# Patient Record
Sex: Male | Born: 1965 | Race: Black or African American | Hispanic: No | Marital: Single | Smoking: Current every day smoker
Health system: Southern US, Community
[De-identification: ages and names within clinical notes are randomized; demographics above are authoritative.]

## PROBLEM LIST (undated history)

## (undated) DIAGNOSIS — R569 Unspecified convulsions: Secondary | ICD-10-CM

## (undated) DIAGNOSIS — S065X9A Traumatic subdural hemorrhage with loss of consciousness of unspecified duration, initial encounter: Secondary | ICD-10-CM

## (undated) DIAGNOSIS — K859 Acute pancreatitis without necrosis or infection, unspecified: Secondary | ICD-10-CM

## (undated) DIAGNOSIS — F10231 Alcohol dependence with withdrawal delirium: Secondary | ICD-10-CM

## (undated) DIAGNOSIS — J9819 Other pulmonary collapse: Secondary | ICD-10-CM

## (undated) DIAGNOSIS — A419 Sepsis, unspecified organism: Secondary | ICD-10-CM

## (undated) DIAGNOSIS — S06369A Traumatic hemorrhage of cerebrum, unspecified, with loss of consciousness of unspecified duration, initial encounter: Secondary | ICD-10-CM

## (undated) DIAGNOSIS — F1910 Other psychoactive substance abuse, uncomplicated: Secondary | ICD-10-CM

## (undated) DIAGNOSIS — E119 Type 2 diabetes mellitus without complications: Secondary | ICD-10-CM

## (undated) DIAGNOSIS — F1410 Cocaine abuse, uncomplicated: Secondary | ICD-10-CM

## (undated) DIAGNOSIS — K7011 Alcoholic hepatitis with ascites: Secondary | ICD-10-CM

## (undated) DIAGNOSIS — I10 Essential (primary) hypertension: Secondary | ICD-10-CM

## (undated) DIAGNOSIS — F10931 Alcohol use, unspecified with withdrawal delirium: Secondary | ICD-10-CM

## (undated) DIAGNOSIS — S0291XB Unspecified fracture of skull, initial encounter for open fracture: Secondary | ICD-10-CM

## (undated) DIAGNOSIS — K861 Other chronic pancreatitis: Secondary | ICD-10-CM

## (undated) DIAGNOSIS — K767 Hepatorenal syndrome: Secondary | ICD-10-CM

## (undated) DIAGNOSIS — F1027 Alcohol dependence with alcohol-induced persisting dementia: Secondary | ICD-10-CM

## (undated) DIAGNOSIS — F1010 Alcohol abuse, uncomplicated: Secondary | ICD-10-CM

## (undated) DIAGNOSIS — N179 Acute kidney failure, unspecified: Secondary | ICD-10-CM

## (undated) DIAGNOSIS — K7031 Alcoholic cirrhosis of liver with ascites: Secondary | ICD-10-CM

## (undated) DIAGNOSIS — E785 Hyperlipidemia, unspecified: Secondary | ICD-10-CM

## (undated) DIAGNOSIS — Z765 Malingerer [conscious simulation]: Secondary | ICD-10-CM

## (undated) DIAGNOSIS — N200 Calculus of kidney: Secondary | ICD-10-CM

## (undated) DIAGNOSIS — G40909 Epilepsy, unspecified, not intractable, without status epilepticus: Secondary | ICD-10-CM

## (undated) DIAGNOSIS — K7290 Hepatic failure, unspecified without coma: Secondary | ICD-10-CM

## (undated) DIAGNOSIS — Z9119 Patient's noncompliance with other medical treatment and regimen: Secondary | ICD-10-CM

## (undated) DIAGNOSIS — Z720 Tobacco use: Secondary | ICD-10-CM

## (undated) HISTORY — PX: OTHER SURGICAL HISTORY: SHX169

---

## 2003-07-16 ENCOUNTER — Emergency Department (HOSPITAL_COMMUNITY): Admission: EM | Admit: 2003-07-16 | Discharge: 2003-07-16 | Payer: Self-pay | Admitting: *Deleted

## 2003-07-27 ENCOUNTER — Emergency Department (HOSPITAL_COMMUNITY): Admission: EM | Admit: 2003-07-27 | Discharge: 2003-07-27 | Payer: Self-pay | Admitting: *Deleted

## 2004-04-08 ENCOUNTER — Emergency Department (HOSPITAL_COMMUNITY): Admission: EM | Admit: 2004-04-08 | Discharge: 2004-04-08 | Payer: Self-pay

## 2004-08-31 ENCOUNTER — Emergency Department (HOSPITAL_COMMUNITY): Admission: EM | Admit: 2004-08-31 | Discharge: 2004-08-31 | Payer: Self-pay | Admitting: Emergency Medicine

## 2004-12-30 HISTORY — PX: MANDIBLE FRACTURE SURGERY: SHX706

## 2005-05-29 ENCOUNTER — Emergency Department (HOSPITAL_COMMUNITY): Admission: EM | Admit: 2005-05-29 | Discharge: 2005-05-29 | Payer: Self-pay | Admitting: Emergency Medicine

## 2005-08-02 ENCOUNTER — Encounter: Payer: Self-pay | Admitting: Emergency Medicine

## 2005-08-02 ENCOUNTER — Inpatient Hospital Stay (HOSPITAL_COMMUNITY): Admission: EM | Admit: 2005-08-02 | Discharge: 2005-08-04 | Payer: Self-pay | Admitting: Emergency Medicine

## 2007-06-25 ENCOUNTER — Emergency Department (HOSPITAL_COMMUNITY): Admission: EM | Admit: 2007-06-25 | Discharge: 2007-06-25 | Payer: Self-pay | Admitting: Emergency Medicine

## 2007-12-30 ENCOUNTER — Emergency Department (HOSPITAL_COMMUNITY): Admission: EM | Admit: 2007-12-30 | Discharge: 2007-12-30 | Payer: Self-pay | Admitting: Emergency Medicine

## 2007-12-31 ENCOUNTER — Emergency Department (HOSPITAL_COMMUNITY): Admission: EM | Admit: 2007-12-31 | Discharge: 2007-12-31 | Payer: Self-pay | Admitting: Emergency Medicine

## 2008-03-08 ENCOUNTER — Emergency Department (HOSPITAL_COMMUNITY): Admission: EM | Admit: 2008-03-08 | Discharge: 2008-03-08 | Payer: Self-pay | Admitting: Emergency Medicine

## 2009-05-06 ENCOUNTER — Inpatient Hospital Stay (HOSPITAL_COMMUNITY): Admission: EM | Admit: 2009-05-06 | Discharge: 2009-05-09 | Payer: Self-pay | Admitting: Emergency Medicine

## 2009-05-23 ENCOUNTER — Emergency Department (HOSPITAL_COMMUNITY): Admission: EM | Admit: 2009-05-23 | Discharge: 2009-05-23 | Payer: Self-pay | Admitting: Emergency Medicine

## 2010-10-01 ENCOUNTER — Emergency Department (HOSPITAL_COMMUNITY)
Admission: EM | Admit: 2010-10-01 | Discharge: 2010-10-02 | Payer: Self-pay | Source: Home / Self Care | Admitting: Emergency Medicine

## 2010-12-24 ENCOUNTER — Emergency Department (HOSPITAL_COMMUNITY)
Admission: EM | Admit: 2010-12-24 | Discharge: 2010-12-24 | Payer: Self-pay | Source: Home / Self Care | Admitting: Emergency Medicine

## 2011-03-11 LAB — DIFFERENTIAL
Lymphs Abs: 0.7 10*3/uL (ref 0.7–4.0)
Monocytes Relative: 7 % (ref 3–12)
Neutro Abs: 8.2 10*3/uL — ABNORMAL HIGH (ref 1.7–7.7)
Neutrophils Relative %: 85 % — ABNORMAL HIGH (ref 43–77)

## 2011-03-11 LAB — HEPATIC FUNCTION PANEL
ALT: 48 U/L (ref 0–53)
Albumin: 3.4 g/dL — ABNORMAL LOW (ref 3.5–5.2)
Alkaline Phosphatase: 134 U/L — ABNORMAL HIGH (ref 39–117)
Bilirubin, Direct: 0.2 mg/dL (ref 0.0–0.3)

## 2011-03-11 LAB — BASIC METABOLIC PANEL
BUN: 15 mg/dL (ref 6–23)
CO2: 23 mEq/L (ref 19–32)
Calcium: 8.3 mg/dL — ABNORMAL LOW (ref 8.4–10.5)
GFR calc Af Amer: >60 mL/min (ref >60)
GFR calc non Af Amer: >60 mL/min (ref >60)

## 2011-03-11 LAB — URINALYSIS, ROUTINE W REFLEX MICROSCOPIC
Bilirubin Urine: NEGATIVE
Hgb urine dipstick: NEGATIVE
Ketones, ur: NEGATIVE mg/dL
Nitrite: NEGATIVE
Protein, ur: NEGATIVE mg/dL
Specific Gravity, Urine: >1.03 — ABNORMAL HIGH (ref 1.005–1.030)
Urobilinogen, UA: 0.2 mg/dL (ref 0.0–1.0)

## 2011-03-11 LAB — CBC
Hemoglobin: 15.1 g/dL (ref 13.0–17.0)
MCH: 33.6 pg (ref 26.0–34.0)
RBC: 4.5 MIL/uL (ref 4.22–5.81)
RDW: 12.8 % (ref 11.5–15.5)

## 2011-03-13 LAB — BASIC METABOLIC PANEL
BUN: 8 mg/dL (ref 6–23)
Creatinine, Ser: 0.93 mg/dL (ref 0.4–1.5)
GFR calc non Af Amer: >60 mL/min (ref >60)
Potassium: 3.7 mEq/L (ref 3.5–5.1)
Sodium: 133 mEq/L — ABNORMAL LOW (ref 135–145)

## 2011-04-09 LAB — TYPE AND SCREEN
ABO/RH(D): B POS
Antibody Screen: NEGATIVE

## 2011-04-09 LAB — CBC
MCHC: 34.6 g/dL (ref 30.0–36.0)
MCHC: 35 g/dL (ref 30.0–36.0)
MCV: 99.4 fL (ref 78.0–100.0)
Platelets: 168 10*3/uL (ref 150–400)
Platelets: 171 10*3/uL (ref 150–400)
RBC: 3.97 MIL/uL — ABNORMAL LOW (ref 4.22–5.81)
RBC: 4.27 MIL/uL (ref 4.22–5.81)
RDW: 14.6 % (ref 11.5–15.5)
WBC: 4.7 10*3/uL (ref 4.0–10.5)
WBC: 5.8 10*3/uL (ref 4.0–10.5)
WBC: 5.9 10*3/uL (ref 4.0–10.5)

## 2011-04-09 LAB — COMPREHENSIVE METABOLIC PANEL
ALT: 30 U/L (ref 0–53)
AST: 50 U/L — ABNORMAL HIGH (ref 0–37)
Alkaline Phosphatase: 58 U/L (ref 39–117)
BUN: 11 mg/dL (ref 6–23)
GFR calc Af Amer: >60 mL/min (ref >60)
Glucose, Bld: 117 mg/dL — ABNORMAL HIGH (ref 70–99)
Sodium: 142 mEq/L (ref 135–145)
Total Bilirubin: 0.8 mg/dL (ref 0.3–1.2)
Total Protein: 6.7 g/dL (ref 6.0–8.3)

## 2011-04-09 LAB — RAPID URINE DRUG SCREEN, HOSP PERFORMED
Benzodiazepines: NOT DETECTED
Cocaine: POSITIVE — AB
Opiates: NOT DETECTED
Tetrahydrocannabinol: NOT DETECTED

## 2011-04-09 LAB — ETHANOL: Alcohol, Ethyl (B): 270 mg/dL — ABNORMAL HIGH (ref 0–10)

## 2011-04-09 LAB — BASIC METABOLIC PANEL
BUN: 7 mg/dL (ref 6–23)
CO2: 27 mEq/L (ref 19–32)
Calcium: 8.8 mg/dL (ref 8.4–10.5)
Calcium: 9.5 mg/dL (ref 8.4–10.5)
Chloride: 105 mEq/L (ref 96–112)
Creatinine, Ser: 0.92 mg/dL (ref 0.4–1.5)
Creatinine, Ser: 0.97 mg/dL (ref 0.4–1.5)
GFR calc Af Amer: >60 mL/min (ref >60)

## 2011-04-09 LAB — DIFFERENTIAL
Basophils Absolute: 0 10*3/uL (ref 0.0–0.1)
Basophils Relative: 0 % (ref 0–1)
Eosinophils Absolute: 0 10*3/uL (ref 0.0–0.7)
Neutro Abs: 3.2 10*3/uL (ref 1.7–7.7)
Neutrophils Relative %: 53 % (ref 43–77)

## 2011-04-09 LAB — POCT I-STAT, CHEM 8
BUN: 12 mg/dL (ref 6–23)
Calcium, Ion: 1.01 mmol/L — ABNORMAL LOW (ref 1.12–1.32)
Creatinine, Ser: 1.1 mg/dL (ref 0.4–1.5)
Glucose, Bld: 109 mg/dL — ABNORMAL HIGH (ref 70–99)
Hemoglobin: 14.6 g/dL (ref 13.0–17.0)
Sodium: 142 mEq/L (ref 135–145)
TCO2: 22 mmol/L (ref 0–100)

## 2011-04-09 LAB — URINALYSIS, ROUTINE W REFLEX MICROSCOPIC
Bilirubin Urine: NEGATIVE
Glucose, UA: NEGATIVE mg/dL
Protein, ur: 100 mg/dL — AB

## 2011-04-09 LAB — URINE MICROSCOPIC-ADD ON
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
## 2011-05-17 NOTE — Op Note (Signed)
Paul [name], Paul [name]             ACCOUNT NO.:  [id number]   MEDICAL RECORD NO.:  [id number]          PATIENT TYPE:  INP   LOCATION:  5729                         FACILITY:  [facility]   PHYSICIAN:  Lucky Cowboy, MD         DATE OF BIRTH:  August 30, 1966   DATE OF PROCEDURE:  08/03/2005  DATE OF DISCHARGE:  08/04/2005                                 OPERATIVE REPORT   PREOPERATIVE DIAGNOSIS:  Left parasymphyseal and right posterior body  mandible fractures.   POSTOPERATIVE DIAGNOSIS:  Left parasymphyseal and right posterior body  mandible fractures.   PROCEDURE:  Open reduction and internal fixation of left mandibular  parasymphyseal and right posterior body fractures.   SURGEON:  Lucky Cowboy, M.D.   ASSISTANT SURGEON:  Antony Contras, M.D.   ANESTHESIA:  General endotracheal anesthesia.   ESTIMATED BLOOD LOSS:  Less than 50 cc.   SPECIMENS:  None.   COMPLICATIONS:  None.   INDICATION:  This patient is a 45 year old male who sustained a mandibular  fractures as described above. He requires fixation.   DESCRIPTION OF PROCEDURE:  The patient was taken to the operating room and  placed on the table in the supine position. He was then placed under general  endotracheal anesthesia. The upper and lower midline gingiva was injected  with 1% lidocaine with 1:100,000 epinephrine. After allowing time for  vasoconstrictive effect, the gingiva was opened up using the cutting mode of  the Bovie. This was performed above the tooth roots superiorly just inferior  to the piriform apertures. This was then used as an entrance to apply the  Therapist, nutritional to elevate soft tissues over the bone. The mandibular screw  fixation system was then used to apply bolts in both of these areas.  Inferiorly, a similar procedure was performed to apply bolts inferiorly  again using care to avoid the tooth roots. The patient was then placed in  maxillomandibular fixation and wires applied. Once this was performed,  the  inferior gingiva was widely opened using the cutting mode of the Bovie  cautery. Freer elevator was used to open up and expose the bony fractures.  Care was used to protect the mental nerve. The bony edges were  reapproximated carefully, and a 2-mm four-hole mandibular fixation plate was  applied to the inferior margin of the left parasymphyseal fracture. Four  screws were then applied which were bicortical.  A monocortical plate was  applied superiorly. The same procedure was applied to the body fracture on  the right. This resulted in Class I occlusion. A monocortical plate was then  applied as well. The patient was taken out of fixation as all fracture lines  had been applied. The patient did have to have a small stab wound in the  face of approximately 0.5 cm to put a transbuccal screw in due to the angle  of the posterior portion of the screw on the posterior portion of the plate  for the right angle  fracture. The patient's incisions were reapproximated in a running locking  stitch using 3-0 chromic. The wounds and all been  irrigated prior to  closure. The patient was then awakened from anesthesia and taken to the  postanesthesia care unit in stable condition. There were no complications.      Lucky Cowboy, MD  Electronically Signed     SJ/MEDQ  D:  10/12/2005  T:  10/12/2005  Job:  [id number]   cc:   [facility]

## 2011-06-29 ENCOUNTER — Emergency Department (HOSPITAL_COMMUNITY): Payer: Self-pay

## 2011-06-29 ENCOUNTER — Emergency Department (HOSPITAL_COMMUNITY)
Admission: EM | Admit: 2011-06-29 | Discharge: 2011-06-29 | Disposition: A | Discharge status: Home / Self Care | Payer: Self-pay | Attending: Emergency Medicine | Admitting: Emergency Medicine

## 2011-06-29 DIAGNOSIS — T6391XA Toxic effect of contact with unspecified venomous animal, accidental (unintentional), initial encounter: Secondary | ICD-10-CM | POA: Insufficient documentation

## 2011-06-29 DIAGNOSIS — I10 Essential (primary) hypertension: Secondary | ICD-10-CM | POA: Insufficient documentation

## 2011-06-29 DIAGNOSIS — F1410 Cocaine abuse, uncomplicated: Secondary | ICD-10-CM | POA: Insufficient documentation

## 2011-06-29 DIAGNOSIS — F1010 Alcohol abuse, uncomplicated: Secondary | ICD-10-CM | POA: Insufficient documentation

## 2011-06-29 DIAGNOSIS — T63391A Toxic effect of venom of other spider, accidental (unintentional), initial encounter: Secondary | ICD-10-CM | POA: Insufficient documentation

## 2011-06-29 LAB — CBC
Hemoglobin: 13.5 g/dL (ref 13.0–17.0)
MCH: 34.4 pg — ABNORMAL HIGH (ref 26.0–34.0)
MCV: 100.3 fL — ABNORMAL HIGH (ref 78.0–100.0)
RBC: 3.92 MIL/uL — ABNORMAL LOW (ref 4.22–5.81)

## 2011-06-29 LAB — DIFFERENTIAL
Basophils Relative: 0 % (ref 0–1)
Lymphocytes Relative: 15 % (ref 12–46)
Lymphs Abs: 1.2 10*3/uL (ref 0.7–4.0)
Monocytes Relative: 8 % (ref 3–12)
Neutro Abs: 6.6 10*3/uL (ref 1.7–7.7)
Neutrophils Relative %: 77 % (ref 43–77)

## 2011-06-29 LAB — BASIC METABOLIC PANEL
CO2: 27 mEq/L (ref 19–32)
Calcium: 10 mg/dL (ref 8.4–10.5)
Glucose, Bld: 86 mg/dL (ref 70–99)
Sodium: 138 mEq/L (ref 135–145)

## 2011-07-24 ENCOUNTER — Inpatient Hospital Stay (HOSPITAL_COMMUNITY)
Admission: EM | Admit: 2011-07-24 | Discharge: 2011-07-29 | DRG: 439 | Disposition: A | Discharge status: Home / Self Care | Payer: Self-pay | Attending: Internal Medicine | Admitting: Internal Medicine

## 2011-07-24 ENCOUNTER — Encounter: Payer: Self-pay | Admitting: *Deleted

## 2011-07-24 DIAGNOSIS — F1410 Cocaine abuse, uncomplicated: Secondary | ICD-10-CM | POA: Diagnosis present

## 2011-07-24 DIAGNOSIS — K859 Acute pancreatitis without necrosis or infection, unspecified: Principal | ICD-10-CM | POA: Diagnosis present

## 2011-07-24 DIAGNOSIS — F17200 Nicotine dependence, unspecified, uncomplicated: Secondary | ICD-10-CM | POA: Diagnosis present

## 2011-07-24 DIAGNOSIS — F1010 Alcohol abuse, uncomplicated: Secondary | ICD-10-CM | POA: Diagnosis present

## 2011-07-24 DIAGNOSIS — K852 Alcohol induced acute pancreatitis without necrosis or infection: Secondary | ICD-10-CM

## 2011-07-24 DIAGNOSIS — Z720 Tobacco use: Secondary | ICD-10-CM | POA: Diagnosis present

## 2011-07-24 DIAGNOSIS — Z5900 Homelessness unspecified: Secondary | ICD-10-CM

## 2011-07-24 DIAGNOSIS — E876 Hypokalemia: Secondary | ICD-10-CM | POA: Diagnosis not present

## 2011-07-24 DIAGNOSIS — I10 Essential (primary) hypertension: Secondary | ICD-10-CM | POA: Diagnosis present

## 2011-07-24 DIAGNOSIS — K920 Hematemesis: Secondary | ICD-10-CM | POA: Diagnosis present

## 2011-07-24 HISTORY — DX: Hyperlipidemia, unspecified: E78.5

## 2011-07-24 HISTORY — DX: Acute pancreatitis without necrosis or infection, unspecified: K85.90

## 2011-07-24 HISTORY — DX: Essential (primary) hypertension: I10

## 2011-07-24 HISTORY — DX: Other pulmonary collapse: J98.19

## 2011-07-24 LAB — URINALYSIS, ROUTINE W REFLEX MICROSCOPIC
Leukocytes, UA: NEGATIVE
Nitrite: NEGATIVE
Protein, ur: 100 mg/dL — AB
Urobilinogen, UA: 0.2 mg/dL (ref 0.0–1.0)

## 2011-07-24 LAB — URINE MICROSCOPIC-ADD ON

## 2011-07-24 LAB — CBC
MCH: 34.3 pg — ABNORMAL HIGH (ref 26.0–34.0)
MCV: 98.9 fL (ref 78.0–100.0)
Platelets: 192 10*3/uL (ref 150–400)
RBC: 4.67 MIL/uL (ref 4.22–5.81)

## 2011-07-24 MED ORDER — CLONIDINE HCL 0.1 MG PO TABS
0.1000 mg | ORAL_TABLET | Freq: Once | ORAL | Status: AC
  Administered 2011-07-24: 0.1 mg via ORAL
  Filled 2011-07-24: qty 1

## 2011-07-24 MED ORDER — ONDANSETRON HCL 4 MG/2ML IJ SOLN
4.0000 mg | Freq: Once | INTRAMUSCULAR | Status: AC
  Administered 2011-07-24: 4 mg via INTRAVENOUS
  Filled 2011-07-24: qty 2

## 2011-07-24 MED ORDER — SODIUM CHLORIDE 0.9 % IV BOLUS (SEPSIS)
1000.0000 mL | Freq: Once | INTRAVENOUS | Status: AC
  Administered 2011-07-24: 1000 mL via INTRAVENOUS

## 2011-07-24 MED ORDER — HYDROMORPHONE HCL 1 MG/ML IJ SOLN
1.0000 mg | Freq: Once | INTRAMUSCULAR | Status: AC
  Administered 2011-07-24: 1 mg via INTRAVENOUS
  Filled 2011-07-24: qty 1

## 2011-07-24 NOTE — ED Notes (Signed)
Denies urinary problems

## 2011-07-24 NOTE — ED Notes (Signed)
Patient with abd pain with N/V for 3 days, denies diarrhea, hx of pancreatitis

## 2011-07-24 NOTE — ED Notes (Addendum)
Notified MD of elevated BP

## 2011-07-24 NOTE — ED Provider Notes (Signed)
History     Chief Complaint  Patient presents with  . Abdominal Pain  . Nausea  . Emesis   Patient is a 45 y.o. male presenting with abdominal pain. The history is provided by the patient. No language interpreter was used.  Abdominal Pain The primary symptoms of the illness include abdominal pain, nausea and vomiting. The primary symptoms of the illness do not include fever, shortness of breath or diarrhea. Episode onset: 4 days ago. The onset of the illness was sudden. The problem has been gradually worsening.  The abdominal pain is located in the periumbilical region and suprapubic region. The abdominal pain does not radiate. The abdominal pain is relieved by nothing.  The patient has not had a change in bowel habit. Symptoms associated with the illness do not include chills. Associated medical issues comments: pancreatitis.  C/o abdominal pain similar to pancreatitis previously experienced onset 4 days ago and worsening since with associated nausea, vomiting and hemopytsis. Patient reports he was diagnosed with pancreatitis a few months ago. States abdominal pain not relieved with pepto-bismol. States last bowel movement was this morning and normal. Denies fever, chest pain, SOB, chills.  Past Medical History  Diagnosis Date  . Pancreatitis, acute   . Hypertension     History reviewed. No pertinent past surgical history.  History reviewed. No pertinent family history.  History  Substance Use Topics  . Smoking status: Current Everyday Smoker -- 1.0 packs/day    Types: Cigarettes  . Smokeless tobacco: Not on file  . Alcohol Use: Yes     3 40oz per day      Review of Systems  Constitutional: Negative for fever and chills.  HENT:       Hemopytsis  Respiratory: Negative for shortness of breath.   Cardiovascular: Negative for chest pain.  Gastrointestinal: Positive for nausea, vomiting and abdominal pain. Negative for diarrhea.  All other systems reviewed and are  negative.  All other systems negative except as noted in HPI.    Physical Exam  BP 195/151  Pulse 116  Temp(Src) 98.4 F (36.9 C) (Oral)  Resp 18  Ht 6' (1.829 m)  Wt 170 lb (77.111 kg)  BMI 23.06 kg/m2  SpO2 99%  Physical Exam  Nursing note and vitals reviewed. Constitutional: He is oriented to person, place, and time. He appears well-developed and well-nourished. No distress.       Hypertensive.   HENT:  Head: Normocephalic and atraumatic.  Eyes: Conjunctivae are normal. Pupils are equal, round, and reactive to light.  Neck: Normal range of motion. Neck supple.  Cardiovascular: Regular rhythm and normal heart sounds.  Tachycardia present.   No murmur heard. Pulmonary/Chest: Effort normal and breath sounds normal. He has no wheezes.  Abdominal: Soft. Bowel sounds are normal. He exhibits no distension. There is tenderness in the periumbilical area and suprapubic area.       No epigastric tenderness.   Musculoskeletal: Normal range of motion.  Neurological: He is alert and oriented to person, place, and time. No sensory deficit.  Skin: Skin is warm and dry.  Psychiatric: He has a normal mood and affect. His behavior is normal.   ED Course  Procedures 5:03 AM Awiating call back from [facility] hospitalist for admission for pancreatitis  MDM Patient with elevated lipase, abdominal pain, vomiting, acute pancreatitis on CT scan.  Admitted to [facility] for further management    Chart written by Clarita Crane acting as scribe for Ethelda Chick, MD  I personally performed  the services described in this documentation, which was scribed in my presence. The recorded information has been reviewed and considered. Ethelda Chick, MD   Ethelda Chick, MD 07/25/11 [phone]

## 2011-07-25 ENCOUNTER — Emergency Department (HOSPITAL_COMMUNITY): Payer: Self-pay

## 2011-07-25 ENCOUNTER — Encounter (HOSPITAL_COMMUNITY): Payer: Self-pay | Admitting: Internal Medicine

## 2011-07-25 ENCOUNTER — Other Ambulatory Visit: Payer: Self-pay

## 2011-07-25 ENCOUNTER — Inpatient Hospital Stay (HOSPITAL_COMMUNITY): Payer: Self-pay

## 2011-07-25 DIAGNOSIS — I10 Essential (primary) hypertension: Secondary | ICD-10-CM | POA: Diagnosis present

## 2011-07-25 DIAGNOSIS — K920 Hematemesis: Secondary | ICD-10-CM

## 2011-07-25 DIAGNOSIS — F1410 Cocaine abuse, uncomplicated: Secondary | ICD-10-CM | POA: Diagnosis present

## 2011-07-25 DIAGNOSIS — F1010 Alcohol abuse, uncomplicated: Secondary | ICD-10-CM | POA: Diagnosis present

## 2011-07-25 DIAGNOSIS — Z720 Tobacco use: Secondary | ICD-10-CM | POA: Diagnosis present

## 2011-07-25 DIAGNOSIS — K852 Alcohol induced acute pancreatitis without necrosis or infection: Secondary | ICD-10-CM | POA: Diagnosis present

## 2011-07-25 DIAGNOSIS — K859 Acute pancreatitis without necrosis or infection, unspecified: Secondary | ICD-10-CM

## 2011-07-25 LAB — COMPREHENSIVE METABOLIC PANEL WITH GFR
ALT: 13 U/L (ref 0–53)
AST: 26 U/L (ref 0–37)
Albumin: 4.1 g/dL (ref 3.5–5.2)
Alkaline Phosphatase: 71 U/L (ref 39–117)
BUN: 8 mg/dL (ref 6–23)
CO2: 27 meq/L (ref 19–32)
Calcium: 10.1 mg/dL (ref 8.4–10.5)
Chloride: 95 meq/L — ABNORMAL LOW (ref 96–112)
Creatinine, Ser: 0.71 mg/dL (ref 0.50–1.35)
GFR calc Af Amer: >60 mL/min
GFR calc non Af Amer: >60 mL/min
Glucose, Bld: 146 mg/dL — ABNORMAL HIGH (ref 70–99)
Potassium: 3.6 meq/L (ref 3.5–5.1)
Sodium: 137 meq/L (ref 135–145)
Total Bilirubin: 0.8 mg/dL (ref 0.3–1.2)
Total Protein: 8.9 g/dL — ABNORMAL HIGH (ref 6.0–8.3)

## 2011-07-25 LAB — COMPREHENSIVE METABOLIC PANEL
Albumin: 3.4 g/dL — ABNORMAL LOW (ref 3.5–5.2)
Alkaline Phosphatase: 59 U/L (ref 39–117)
BUN: 6 mg/dL (ref 6–23)
Calcium: 9.1 mg/dL (ref 8.4–10.5)
Creatinine, Ser: 0.63 mg/dL (ref 0.50–1.35)
Potassium: 3.3 mEq/L — ABNORMAL LOW (ref 3.5–5.1)
Total Protein: 7.4 g/dL (ref 6.0–8.3)

## 2011-07-25 LAB — CBC
HCT: 41.3 % (ref 39.0–52.0)
Hemoglobin: 14.2 g/dL (ref 13.0–17.0)
MCV: 98.3 fL (ref 78.0–100.0)
RBC: 4.2 MIL/uL — ABNORMAL LOW (ref 4.22–5.81)
WBC: 5.6 10*3/uL (ref 4.0–10.5)

## 2011-07-25 LAB — LIPASE, BLOOD: Lipase: 674 U/L — ABNORMAL HIGH (ref 11–59)

## 2011-07-25 LAB — RAPID URINE DRUG SCREEN, HOSP PERFORMED
Amphetamines: NOT DETECTED
Benzodiazepines: NOT DETECTED
Opiates: NOT DETECTED
Tetrahydrocannabinol: NOT DETECTED

## 2011-07-25 LAB — LIPID PANEL: HDL: 146 mg/dL (ref >39)

## 2011-07-25 MED ORDER — AMLODIPINE BESYLATE 5 MG PO TABS
10.0000 mg | ORAL_TABLET | Freq: Every day | ORAL | Status: DC
  Administered 2011-07-25: 10 mg via ORAL
  Filled 2011-07-25: qty 2

## 2011-07-25 MED ORDER — LISINOPRIL 10 MG PO TABS: 20.0000 mg | ORAL_TABLET | Freq: Every day | ORAL | Status: DC

## 2011-07-25 MED ORDER — LORAZEPAM 1 MG PO TABS: 0.0000 mg | ORAL_TABLET | Freq: Two times a day (BID) | ORAL | Status: AC

## 2011-07-25 MED ORDER — NICOTINE 14 MG/24HR TD PT24
14.0000 mg | MEDICATED_PATCH | Freq: Every day | TRANSDERMAL | Status: DC
  Administered 2011-07-25 – 2011-07-28 (×4): 14 mg via TRANSDERMAL
  Filled 2011-07-25 (×5): qty 1

## 2011-07-25 MED ORDER — DEXTROSE-NACL 5-0.45 % IV SOLN
INTRAVENOUS | Status: DC
  Administered 2011-07-25: 11:00:00 via INTRAVENOUS
  Administered 2011-07-25: 1000 mL via INTRAVENOUS
  Administered 2011-07-25 – 2011-07-26 (×4): via INTRAVENOUS

## 2011-07-25 MED ORDER — THERA M PLUS PO TABS
1.0000 | ORAL_TABLET | Freq: Every day | ORAL | Status: DC
  Administered 2011-07-26 – 2011-07-29 (×4): 1 via ORAL
  Filled 2011-07-25 (×4): qty 1

## 2011-07-25 MED ORDER — ACETAMINOPHEN 325 MG PO TABS
650.0000 mg | ORAL_TABLET | Freq: Four times a day (QID) | ORAL | Status: DC | PRN
  Administered 2011-07-25: 650 mg via ORAL
  Filled 2011-07-25: qty 2

## 2011-07-25 MED ORDER — OXYCODONE HCL 5 MG PO TABS: 5.0000 mg | ORAL_TABLET | ORAL | Status: DC | PRN

## 2011-07-25 MED ORDER — SODIUM CHLORIDE 0.9 % IJ SOLN
INTRAMUSCULAR | Status: AC
  Administered 2011-07-25: 10 mL via INTRAVENOUS
  Filled 2011-07-25: qty 10

## 2011-07-25 MED ORDER — PROMETHAZINE HCL 12.5 MG PO TABS: 12.5000 mg | ORAL_TABLET | Freq: Four times a day (QID) | ORAL | Status: DC | PRN

## 2011-07-25 MED ORDER — ONDANSETRON HCL 4 MG/2ML IJ SOLN: 4.0000 mg | Freq: Four times a day (QID) | INTRAMUSCULAR | Status: DC | PRN

## 2011-07-25 MED ORDER — SODIUM CHLORIDE 0.9 % IV BOLUS (SEPSIS)
1000.0000 mL | Freq: Once | INTRAVENOUS | Status: AC
  Administered 2011-07-25: 1000 mL via INTRAVENOUS

## 2011-07-25 MED ORDER — FOLIC ACID 5 MG/ML IJ SOLN
1.0000 mg | Freq: Every day | INTRAMUSCULAR | Status: DC
  Administered 2011-07-25: 1 mg via INTRAVENOUS
  Filled 2011-07-25 (×2): qty 0.2

## 2011-07-25 MED ORDER — HYDROMORPHONE HCL 1 MG/ML IJ SOLN
1.0000 mg | Freq: Once | INTRAMUSCULAR | Status: AC
  Administered 2011-07-25: 1 mg via INTRAVENOUS
  Filled 2011-07-25: qty 1

## 2011-07-25 MED ORDER — NITROGLYCERIN 2 % TD OINT
1.0000 [in_us] | TOPICAL_OINTMENT | Freq: Four times a day (QID) | TRANSDERMAL | Status: DC
  Administered 2011-07-25 – 2011-07-26 (×5): 1 [in_us] via TOPICAL
  Filled 2011-07-25 (×5): qty 1

## 2011-07-25 MED ORDER — IOHEXOL 300 MG/ML  SOLN
100.0000 mL | Freq: Once | INTRAMUSCULAR | Status: AC | PRN
  Administered 2011-07-25: 100 mL via INTRAVENOUS

## 2011-07-25 MED ORDER — LABETALOL HCL 5 MG/ML IV SOLN
20.0000 mg | Freq: Once | INTRAVENOUS | Status: AC
  Administered 2011-07-25: 20 mg via INTRAVENOUS
  Filled 2011-07-25: qty 4

## 2011-07-25 MED ORDER — LORAZEPAM 1 MG PO TABS
1.0000 mg | ORAL_TABLET | Freq: Four times a day (QID) | ORAL | Status: AC | PRN
  Filled 2011-07-25: qty 1

## 2011-07-25 MED ORDER — LORAZEPAM 2 MG/ML IJ SOLN: 1.0000 mg | Freq: Four times a day (QID) | INTRAMUSCULAR | Status: AC | PRN

## 2011-07-25 MED ORDER — LORAZEPAM 1 MG PO TABS
0.0000 mg | ORAL_TABLET | Freq: Four times a day (QID) | ORAL | Status: AC
  Administered 2011-07-26: 1 mg via ORAL

## 2011-07-25 MED ORDER — ALBUTEROL SULFATE (5 MG/ML) 0.5% IN NEBU: 2.5000 mg | INHALATION_SOLUTION | RESPIRATORY_TRACT | Status: DC | PRN

## 2011-07-25 MED ORDER — ONDANSETRON HCL 4 MG PO TABS: 4.0000 mg | ORAL_TABLET | Freq: Four times a day (QID) | ORAL | Status: DC | PRN

## 2011-07-25 MED ORDER — HYDROMORPHONE HCL 1 MG/ML IJ SOLN
1.0000 mg | INTRAMUSCULAR | Status: DC | PRN
  Administered 2011-07-25 – 2011-07-27 (×9): 1 mg via INTRAVENOUS
  Filled 2011-07-25 (×10): qty 1

## 2011-07-25 MED ORDER — PANTOPRAZOLE SODIUM 40 MG IV SOLR
40.0000 mg | Freq: Two times a day (BID) | INTRAVENOUS | Status: DC
  Administered 2011-07-25: 40 mg via INTRAVENOUS
  Filled 2011-07-25 (×2): qty 40

## 2011-07-25 MED ORDER — PROMETHAZINE HCL 25 MG/ML IJ SOLN: 12.5000 mg | Freq: Four times a day (QID) | INTRAMUSCULAR | Status: DC | PRN

## 2011-07-25 MED ORDER — ACETAMINOPHEN 650 MG RE SUPP: 650.0000 mg | Freq: Four times a day (QID) | RECTAL | Status: DC | PRN

## 2011-07-25 MED ORDER — BIOTENE DRY MOUTH MT LIQD
OROMUCOSAL | Status: DC | PRN
  Administered 2011-07-25: 15:00:00 via OROMUCOSAL

## 2011-07-25 MED ORDER — HYDRALAZINE HCL 20 MG/ML IJ SOLN
10.0000 mg | Freq: Four times a day (QID) | INTRAMUSCULAR | Status: DC | PRN
  Administered 2011-07-25 – 2011-07-26 (×3): 10 mg via INTRAVENOUS
  Filled 2011-07-25 (×3): qty 1

## 2011-07-25 MED ORDER — THIAMINE HCL 100 MG/ML IJ SOLN
100.0000 mg | Freq: Every day | INTRAMUSCULAR | Status: DC
  Administered 2011-07-25 – 2011-07-27 (×3): 100 mg via INTRAVENOUS
  Filled 2011-07-25 (×3): qty 2

## 2011-07-25 NOTE — Consult Note (Cosign Needed)
Referring Provider: [facility] Hospitalist AP1, Dr. Osvaldo Shipper Primary Care Physician:  None Primary Gastroenterologist:  Roetta Sessions, MD  Reason for Consultation:  Acute pancreatitis, hematemesis  HPI: Paul [name] is a 45 y.o. male admitted with acute pancreatitis. Abdominal pain started five days ago. C/O N/V. Single episode of bright red emesis yesterday. Since then he has had clear emesis. No melena, brbpr. No constipation/diarrhea. Lots of heartburn. No dysphagia. Weight stable. No dysuria. No chest or coughing. Last etoh four days ago. H/O DTs. Seen in ED 11/2010 with pancreatitis. Patient states this is his second episode. Yesterday tried Pepto Bismol and Maalox, didn't help. No other medications. None on regular basis.   Patient states he is homeless and has been for years. He stays with friends or in abandoned homes. Has h/o HTN, hyperlipidemia but has not been on meds in years. Admits to daily beer use, 120 ounces, daily. Also with frequent cocaine use.     Current Facility-Administered Medications  Medication Dose Route Frequency Provider Last Rate Last Dose  . acetaminophen (TYLENOL) tablet 650 mg  650 mg Oral Q6H PRN Gokul Krishnan       Or  . acetaminophen (TYLENOL) suppository 650 mg  650 mg Rectal Q6H PRN Osvaldo Shipper      . albuterol (PROVENTIL) (5 MG/ML) 0.5% nebulizer solution 2.5 mg  2.5 mg Nebulization Q2H PRN Osvaldo Shipper      . amLODipine (NORVASC) tablet 10 mg  10 mg Oral Daily Gokul Krishnan   10 mg at 07/25/11 0615  . cloNIDine (CATAPRES) tablet 0.1 mg  0.1 mg Oral Once Ethelda Chick, MD   0.1 mg at 07/24/11 2345  . dextrose 5 %-0.45 % sodium chloride infusion   Intravenous Continuous Gokul Krishnan      . folic acid injection 1 mg  1 mg Intravenous Daily Gokul Krishnan      . hydrALAZINE (APRESOLINE) injection 10 mg  10 mg Intravenous Q6H PRN Gokul Krishnan   10 mg at 07/25/11 4098  . HYDROmorphone (DILAUDID) injection 1 mg  1 mg Intravenous Once Ethelda Chick, MD   1 mg at 07/24/11 2245  . HYDROmorphone (DILAUDID) injection 1 mg  1 mg Intravenous Once Ethelda Chick, MD   1 mg at 07/25/11 0100  . HYDROmorphone (DILAUDID) injection 1 mg  1 mg Intravenous Q3H PRN Gokul Krishnan   1 mg at 07/25/11 0921  . iohexol (OMNIPAQUE) 300 MG/ML injection 100 mL  100 mL Intravenous Once PRN Medication Radiologist   100 mL at 07/25/11 0233  . labetalol (NORMODYNE,TRANDATE) injection 20 mg  20 mg Intravenous Once Gokul Krishnan   20 mg at 07/25/11 0615  . lisinopril (PRINIVIL,ZESTRIL) tablet 20 mg  20 mg Oral Daily Gokul Krishnan      . LORazepam (ATIVAN) tablet 1 mg  1 mg Oral Q6H PRN Gokul Krishnan       Or  . LORazepam (ATIVAN) injection 1 mg  1 mg Intravenous Q6H PRN Osvaldo Shipper      . LORazepam (ATIVAN) tablet 0-4 mg  0-4 mg Oral Q6H Gokul Krishnan       Followed by  . LORazepam (ATIVAN) tablet 0-4 mg  0-4 mg Oral Q12H Gokul Krishnan      . multivitamins ther. w/minerals tablet 1 tablet  1 tablet Oral Daily Gokul Krishnan      . nicotine (NICODERM CQ - dosed in mg/24 hours) patch 14 mg  14 mg Transdermal Daily Gokul Krishnan      .  nitroGLYCERIN (NITROGLYN) 2 % ointment 1 inch  1 inch Topical Q6H Gokul Krishnan   1 inch at 07/25/11 0615  . ondansetron (ZOFRAN) injection 4 mg  4 mg Intravenous Once Ethelda Chick, MD   4 mg at 07/24/11 2245  . ondansetron (ZOFRAN) tablet 4 mg  4 mg Oral Q6H PRN Gokul Krishnan       Or  . ondansetron (ZOFRAN) injection 4 mg  4 mg Intravenous Q6H PRN Gokul Krishnan      . oxyCODONE (Oxy IR/ROXICODONE) immediate release tablet 5 mg  5 mg Oral Q4H PRN Gokul Krishnan      . pantoprazole (PROTONIX) injection 40 mg  40 mg Intravenous Q12H Gokul Krishnan      . promethazine (PHENERGAN) tablet 12.5 mg  12.5 mg Oral Q6H PRN Gokul Krishnan       Or  . promethazine (PHENERGAN) injection 12.5 mg  12.5 mg Intravenous Q6H PRN Gokul Krishnan      . sodium chloride 0.9 % bolus 1,000 mL  1,000 mL Intravenous Once Ethelda Chick,  MD   1,000 mL at 07/24/11 2245  . sodium chloride 0.9 % bolus 1,000 mL  1,000 mL Intravenous Once Ethelda Chick, MD   1,000 mL at 07/25/11 0015  . thiamine (B-1) injection 100 mg  100 mg Intravenous Daily Gokul Krishnan        Allergies as of 07/24/2011  . (No Known Allergies)    Past Medical History  Diagnosis Date  . Pancreatitis, acute     First episode earlier in 2012  . Hypertension     noncompliance  . Hyperlipidemia     noncompliance  . Lung collapse     h/o    Past Surgical History  Procedure Date  . Mandible fracture surgery 2006  . Left axillary     surgery for deep laceration    Family History  Problem Relation Age of Onset  . Diabetes type II Mother   . Liver disease Neg Hx   . Colon cancer Neg Hx   . GI problems Neg Hx     History   Social History  . Marital Status: Single    Spouse Name: N/A    Number of Children: 0  . Years of Education: N/A   Occupational History  . unemployed    Social History Main Topics  . Smoking status: Current Everyday Smoker -- 1.0 packs/day    Types: Cigarettes  . Smokeless tobacco: Not on file  . Alcohol Use: Yes     Three 40oz per day  . Drug Use: 3 per week    Special: Cocaine     Denies intranasal or IV drug use.   Marland Kitchen Sexually Active: Yes   Other Topics Concern  . Not on file   Social History Narrative   Homeless, years. Stays anywhere can, abandoned homes/friends/etc.     ROS:  General: Negative for anorexia, weight loss, fever, chills, fatigue, weakness. Eyes: Negative for vision changes.  ENT: Negative for hoarseness, difficulty swallowing , nasal congestion. CV: Negative for chest pain, angina, palpitations, dyspnea on exertion, peripheral edema.  Respiratory: Negative for dyspnea at rest, dyspnea on exertion, cough, sputum, wheezing.  GI: See history of present illness. GU:  Negative for dysuria, hematuria, urinary incontinence, urinary frequency, nocturnal urination.  MS: Negative for joint  pain, low back pain.  Derm: Negative for rash or itching.  Neuro: Negative for weakness, abnormal sensation, seizure, frequent headaches, memory loss, confusion.  Psych:  Negative for anxiety, depression, suicidal ideation, hallucinations.  Endo: Negative for unusual weight change.  Heme: Negative for bruising or bleeding. Allergy: Negative for rash or hives.       Physical Examination: Vital signs in last 24 hours: Temp:  [98 F (36.7 C)-98.4 F (36.9 C)] 98 F (36.7 C) (07/26 0915) Pulse Rate:  [86-116] 88  (07/26 0915) Resp:  [16-18] 16  (07/26 0915) BP: (161-208)/(112-151) 161/121 mmHg (07/26 0915) SpO2:  [97 %-100 %] 97 % (07/26 0915) Weight:  [170 lb (77.111 kg)-173 lb 6.4 oz (78.654 kg)] 173 lb 6.4 oz (78.654 kg) (07/26 0907) Last BM Date: 07/24/11  General: Well-nourished, well-developed in no acute distress.  Head: Normocephalic, atraumatic.   Eyes: Conjunctiva pink, no icterus. Mouth: Oropharyngeal mucosa moist and pink , no lesions erythema or exudate. Neck: Supple without thyromegaly, masses, or lymphadenopathy.  Lungs: Clear to auscultation bilaterally.  Heart: Regular rate and rhythm, no murmurs rubs or gallops.  Abdomen: Bowel sounds are normal, nondistended, no hepatosplenomegaly or masses, no abdominal bruits or hernia. Moderate periumbilical tenderness without rebound or guarding.   Rectal: Not performed.  Extremities: No lower extremity edema, clubbing, deformity.  Neuro: Alert and oriented x 4 , grossly normal neurologically.  Skin: Warm and dry, no rash or jaundice.   Psych: Alert and cooperative, normal mood and affect.        Intake/Output from previous day:   Intake/Output this shift:    Lab Results: CBC  Basename 07/24/11 2242  WBC 7.2  HGB 16.0  HCT 46.2  PLT 192   BMET  Basename 07/24/11 2242  NA 137  K 3.6  CL 95*  CO2 27  GLUCOSE 146*  BUN 8  CREATININE 0.71  CALCIUM 10.1   LFT  Basename 07/24/11 2242  PROT 8.9*  ALBUMIN  4.1  AST 26  ALT 13  ALKPHOS 71  BILITOT 0.8  BILIDIR --  IBILI --   Lipase  Date Value Range Status  07/24/2011 674* 11-59 (U/L) Final   Imaging Studies: Ct Abdomen Pelvis W Contrast  07/25/2011  *RADIOLOGY REPORT*  Clinical Data: In  CT ABDOMEN AND PELVIS WITH CONTRAST  Technique:  Multidetector CT imaging of the abdomen and pelvis was performed following the standard protocol during bolus administration of intravenous contrast.  Contrast: 100 ml Omnipaque-300 intravenous contrast  Comparison: 05/06/2009  Findings: Limited images through the lung bases demonstrate no significant appreciable abnormality. The heart size is within normal limits. No pleural or pericardial effusion.  The distal esophagus is mildly thickened with intraluminal fluid.  There is peripancreatic fat stranding in keeping with acute pancreatitis.  Stranding / ill-defined fluid extends into the left anterior pararenal space.  The gland enhances homogeneously, without evidence for necrosis.  No pseudocyst or abscess identified.  Unremarkable liver, biliary system, spleen, and adrenal glands. Nonobstructing interpolar right renal stone.  Otherwise symmetric renal enhancement.  No hydronephrosis.  Inflammation abuts the descending colon, gastric antrum, and several proximal jejunal bowel loops.  Otherwise, bowel is of normal course and caliber.  Normal appendix.  No loculated fluid collection or free intraperitoneal air.  Vasculature within normal limits.  Splenic vein remains patent.  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  IMPRESSION: Findings in keeping with acute pancreatitis.  No pseudocyst or abscess identified.  Inflammatory changes abut adjacent bowel loops as described above however no evidence for bowel perforation.  Original Report Authenticated By: Waneta Martins, M.D.    Impression: 45 yo  AA male with acute pancreatitis likely secondary to etoh consumption. Other etiologies include gallstones,  hypertriglyceridemia. He also has mild distal esophageal thickening which could be esophagitis from vomiting, reflux esophagitis, etc. Inflammation abuts descending colon, gastric antrum, and several proximal jejunal bowel loops. Suspect hematemesis due to M-W tear.   Plan: 1. Supportive measures with pain control, antiemetics, IV hydration.  2. NPO except ice chips. 3. F/U abdominal u/s as available. 4. Consider EGD prior to discharge. To discuss with Dr. Jena Gauss.  5. F/U lipid panel as available.  6. Lipase in am.  7. Agree with DT prophylaxis and IV Protonix. 8. Further recommendations to follow.   I would like to thank Dr. Rito Ehrlich for allowing [place] to take part in the care of this nice patient.    LOS: 1 day   Tana Coast  07/25/2011, 9:27 AM

## 2011-07-25 NOTE — Consult Note (Signed)
Pt seen and examined; CT reviewed with Dr. Call;  Gallbladder ultrasound negative; agree with above assessment and recommendations including aggressive hydration. Alcohol most likely etiology with other possibilities including ischemia (cocaine abuse) much less likely. He will need a diagnostic EGD prior to discharge. This procedure will need to be done under deep sedation with the help of anesthesia given polysubstance abuse.  The patient is already improved significantly. He may be ready to have his diet advanced to clear liquids on 07/26/2011.  Dr. Darrick Penna will begin seeing 07/26/2011 in my absence.

## 2011-07-25 NOTE — ED Notes (Signed)
Lab states they are able to run blood for lipid panel
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
## 2011-07-25 NOTE — Progress Notes (Signed)
UR Chart Review Completed  

## 2011-07-25 NOTE — Progress Notes (Signed)
  Pt admitted by Dr. Barnie Del today.  Chart reviewed. Pt examined.

## 2011-07-26 LAB — LIPASE, BLOOD: Lipase: 169 U/L — ABNORMAL HIGH (ref 11–59)

## 2011-07-26 LAB — COMPREHENSIVE METABOLIC PANEL
Alkaline Phosphatase: 57 U/L (ref 39–117)
BUN: 3 mg/dL — ABNORMAL LOW (ref 6–23)
CO2: 24 mEq/L (ref 19–32)
Calcium: 8.9 mg/dL (ref 8.4–10.5)
GFR calc Af Amer: >60 mL/min (ref >60)
GFR calc non Af Amer: >60 mL/min (ref >60)
Glucose, Bld: 145 mg/dL — ABNORMAL HIGH (ref 70–99)
Total Protein: 7.2 g/dL (ref 6.0–8.3)

## 2011-07-26 LAB — CBC
HCT: 38.7 % — ABNORMAL LOW (ref 39.0–52.0)
HCT: 39.3 % (ref 39.0–52.0)
Hemoglobin: 13.4 g/dL (ref 13.0–17.0)
Hemoglobin: 13.8 g/dL (ref 13.0–17.0)
MCH: 34.1 pg — ABNORMAL HIGH (ref 26.0–34.0)
MCHC: 34.6 g/dL (ref 30.0–36.0)
MCHC: 35.1 g/dL (ref 30.0–36.0)
MCV: 97 fL (ref 78.0–100.0)
RBC: 4.05 MIL/uL — ABNORMAL LOW (ref 4.22–5.81)
RDW: 13.1 % (ref 11.5–15.5)
WBC: 5.5 10*3/uL (ref 4.0–10.5)

## 2011-07-26 MED ORDER — POTASSIUM CHLORIDE 10 MEQ/100ML IV SOLN
INTRAVENOUS | Status: AC
  Administered 2011-07-26: 10 meq via INTRAVENOUS
  Filled 2011-07-26: qty 100

## 2011-07-26 MED ORDER — POTASSIUM CHLORIDE 10 MEQ/100ML IV SOLN
10.0000 meq | INTRAVENOUS | Status: AC
  Administered 2011-07-26 (×2): 10 meq via INTRAVENOUS

## 2011-07-26 MED ORDER — POTASSIUM CHLORIDE IN NACL 40-0.9 MEQ/L-% IV SOLN
INTRAVENOUS | Status: DC
  Administered 2011-07-26 (×2): via INTRAVENOUS
  Filled 2011-07-26 (×10): qty 1000

## 2011-07-26 MED ORDER — PANTOPRAZOLE SODIUM 40 MG IV SOLR
40.0000 mg | Freq: Two times a day (BID) | INTRAVENOUS | Status: DC
  Administered 2011-07-26 – 2011-07-27 (×3): 40 mg via INTRAVENOUS
  Filled 2011-07-26 (×3): qty 40

## 2011-07-26 MED ORDER — CLONIDINE HCL 0.3 MG/24HR TD PTWK
0.3000 mg | MEDICATED_PATCH | TRANSDERMAL | Status: DC
  Administered 2011-07-26: 0.3 mg via TRANSDERMAL
  Filled 2011-07-26: qty 1

## 2011-07-26 NOTE — ED Notes (Signed)
Nutrition Education Consult  Survival information provided for Fat Modified diet. Pt currently consumes Regular diet at home. Basics reviewed which included identifying sources of fat in his diet and offering low fat alternates. Nutr ZO:XWRU nutrient needs r/t altered GI function AEB Acute and Chronic Pancreatitis Goal: pt will demonstrate understanding of basics by identifying 2 sources of fat in his current diet. Goal met. Intervention: Pt provided handouts Pancreatitis Nutrition Therapy in addition to verbal review of materials.  Follow-up as needed with OP RD for additional re-inforcment.

## 2011-07-26 NOTE — Progress Notes (Signed)
Subjective: Still 6/10 pain. No nausea. No withdrawal sx.   Objective: Vital signs in last 24 hours: Temp:  [98 F (36.7 C)-98.4 F (36.9 C)] 98.4 F (36.9 C) (07/27 0538) Pulse Rate:  [77-92] 81  (07/27 0538) Resp:  [14-20] 16  (07/27 0538) BP: (143-173)/(88-121) 173/103 mmHg (07/27 0538) SpO2:  [93 %-99 %] 96 % (07/27 0538) Weight:  [78.654 kg (173 lb 6.4 oz)] 173 lb 6.4 oz (78.654 kg) (07/26 0907) Weight change: 1.542 kg (3 lb 6.4 oz) Last BM Date: 07/24/11  Intake/Output from previous day: 07/26 0701 - 07/27 0700 In: 4806.3 [I.V.:4806.3] Out: 200 [Urine:200] Intake/Output this shift:    General appearance: alert, cooperative, mild distress and oriented and appropriate Resp: clear to auscultation bilaterally Cardio: regular rate and rhythm, S1, S2 normal, no murmur, click, rub or gallop GI: Soft, guarding, mild tenderness. bowel sounds present Extremities: extremities normal, atraumatic, no cyanosis or edema Neurologic: No tremmor  Lab Results:  Basename 07/26/11 0438 07/26/11  WBC 5.5 5.7  HGB 13.4 13.8  HCT 38.7* 39.3  PLT 147* 150   BMET  Basename 07/26/11 0438 07/25/11 1133  NA 133* 134*  K 3.3* 3.3*  CL 96 97  CO2 24 23  GLUCOSE 145* 143*  BUN 3* 6  CREATININE 0.54 0.63  CALCIUM 8.9 9.1   US Abdomen Complete  07/25/2011  *RADIOLOGY REPORT*  Clinical Data:  History of pancreatitis.  ABDOMINAL ULTRASOUND COMPLETE  Comparison:  CT 07/25/2011.  Findings:  Gallbladder: No shadowing gallstones or echogenic sludge. No gallbladder wall thickening or pericholecystic fluid. The gallbladder wall thickness measured 2.1 mm. No sonographic Murphy's sign according to the ultrasound technologist.  CBD: Normal in caliber measuring 4.5 mm. No choledocholithiasis is evident.  Liver:  Normal size and echotexture without focal parenchymal abnormality.  IVC:  Patent throughout its visualized course in the abdomen.  Pancreas: Pancreatic tissue was obscured by bowel gas.  No  pseudocyst could be identified.  No mass or abscess could be identified.  Spleen:  Normal size and echotexture without focal abnormality.  Right kidney:  No hydronephrosis.  Well-preserved cortex.  Normal parenchymal echotexture without focal abnormalities.  Right renal length is 11.4 cm.  Left kidney:  No hydronephrosis.  Well-preserved cortex.  Normal parenchymal echotexture without focal abnormalities.  Left renal length is 11.1 cm.  Aorta:  Maximum diameter is 3.0 cm.  No aneurysm is evident.  Ascites:  None.  IMPRESSION: No abdominal pathology was demonstrated.  Pancreas was obscured by bowel gas.  No pseudocyst could be identified.  No mass or abscess could be identified.  No cholelithiasis was seen.  Original Report Authenticated By: Crawford Givens, M.D.   Ct Abdomen Pelvis W Contrast  07/25/2011  *RADIOLOGY REPORT*  Clinical Data: In  CT ABDOMEN AND PELVIS WITH CONTRAST  Technique:  Multidetector CT imaging of the abdomen and pelvis was performed following the standard protocol during bolus administration of intravenous contrast.  Contrast: 100 ml Omnipaque-300 intravenous contrast  Comparison: 05/06/2009  Findings: Limited images through the lung bases demonstrate no significant appreciable abnormality. The heart size is within normal limits. No pleural or pericardial effusion.  The distal esophagus is mildly thickened with intraluminal fluid.  There is peripancreatic fat stranding in keeping with acute pancreatitis.  Stranding / ill-defined fluid extends into the left anterior pararenal space.  The gland enhances homogeneously, without evidence for necrosis.  No pseudocyst or abscess identified.  Unremarkable liver, biliary system, spleen, and adrenal glands. Nonobstructing interpolar right renal stone.  Otherwise symmetric renal enhancement.  No hydronephrosis.  Inflammation abuts the descending colon, gastric antrum, and several proximal jejunal bowel loops.  Otherwise, bowel is of normal course and  caliber.  Normal appendix.  No loculated fluid collection or free intraperitoneal air.  Vasculature within normal limits.  Splenic vein remains patent.  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  IMPRESSION: Findings in keeping with acute pancreatitis.  No pseudocyst or abscess identified.  Inflammatory changes abut adjacent bowel loops as described above however no evidence for bowel perforation.  Original Report Authenticated By: Waneta Martins, M.D.   Dg Foot Complete Left  06/29/2011  *RADIOLOGY REPORT*  Clinical Data: Open soft tissue wound.  LEFT FOOT - COMPLETE 3+ VIEW  Comparison: None.  Findings: Mild first metatarsal phalangeal joint osteoarthritis. Forefoot soft tissue swelling without osseous erosion.  IMPRESSION:  1.  Forefoot soft tissue swelling without osseous erosion to suggest osteomyelitis. 2.  Mild first metatarsal phalangeal joint osteoarthritis.  Original Report Authenticated By: Reyes Ivan, M.D.    Assessment/Plan: Principal Problem:  *Acute alcoholic pancreatitis:  Still pain. Keep NPO. Active Problems:  HTN (hypertension): uncontrolled. Increase clonidine  Cocaine abuse:  "I can stop whenever I want."  Has been in rehab before.  Doubt pt ready to quit using.  Seen by s.w.  Alcohol abuse:  See above  Hematemesis:  None further  Tobacco abuse Hypokalemia:  replete    LOS: 2 days   Alaysia Lightle L 07/26/2011, 8:21 AM

## 2011-07-26 NOTE — Progress Notes (Signed)
Pt tolerating clear liquid diet. Would advance to a low fat diet as tolerated. Continue as OP and pt instructed to avoid EtOH. Pt needs OPV with Dr. Jena Gauss in 2-3 weeks and will be scheduled for a TCS: screening, and EGD:?esophagitis with propofol as an OP. Pt needs to have procedure with propofol and his UDS neg for cocaine. Discussed this with Mr. Longsworth. Pt voiced understanding.

## 2011-07-26 NOTE — Progress Notes (Signed)
Subjective: Paul [name] is a 45 y.o. male with acute pancreatitis, likely secondary to etoh abuse. Still c/o 6/10 pain. Taking Dilaudid frequently with good results. No n/v. No BM or flatus. NPO.  Objective: Vital signs in last 24 hours: Temp:  [98.1 F (36.7 C)-98.4 F (36.9 C)] 98.4 F (36.9 C) (07/27 0538) Pulse Rate:  [77-92] 81  (07/27 0538) Resp:  [14-20] 16  (07/27 0538) BP: (143-173)/(88-103) 173/103 mmHg (07/27 0538) SpO2:  [93 %-99 %] 96 % (07/27 0538) Last BM Date: 07/24/11 General:   Alert,  Well-developed, well-nourished, pleasant and cooperative in NAD Head:  Normocephalic and atraumatic. Eyes:  Sclera clear, no icterus.     Abdomen:  Soft, moderate epigastric tenderness. Normal bowel sounds, without guarding, and without rebound.   Extremities:  Without clubbing or edema. Neurologic:  Alert and  oriented x4;  grossly normal neurologically. Skin:  Intact without significant lesions or rashes. Psych:  Alert and cooperative. Normal mood and affect.  Intake/Output from previous day: 07/26 0701 - 07/27 0700 In: 4806.3 [I.V.:4806.3] Out: 200 [Urine:200] Intake/Output this shift:    Lab Results: CBC  Basename 07/26/11 0438 07/26/11 07/25/11 1547  WBC 5.5 5.7 5.6  HGB 13.4 13.8 14.2  HCT 38.7* 39.3 41.3  PLT 147* 150 156   BMET  Basename 07/26/11 0438 07/25/11 1133 07/24/11 2242  NA 133* 134* 137  K 3.3* 3.3* 3.6  CL 96 97 95*  CO2 24 23 27   GLUCOSE 145* 143* 146*  BUN 3* 6 8  CREATININE 0.54 0.63 0.71  CALCIUM 8.9 9.1 10.1   LFT  Basename 07/26/11 0438 07/25/11 1133 07/24/11 2242  PROT 7.2 7.4 8.9*  ALBUMIN 3.0* 3.4* 4.1  AST 21 19 26   ALT 10 10 13   ALKPHOS 57 59 71  BILITOT 1.0 1.0 0.8  BILIDIR -- -- --  IBILI -- -- --   Triglycerides  Date/Time Value Range Status  07/24/2011 10:00 PM 85  <150 (mg/dL) Final   Lipase  Date/Time Value Range Status  07/26/2011  4:38 AM 169* 11-59 (U/L) Final   Imaging Studies: US Abdomen  Complete  07/25/2011  *RADIOLOGY REPORT*  Clinical Data:  History of pancreatitis.  ABDOMINAL ULTRASOUND COMPLETE  Comparison:  CT 07/25/2011.  Findings:  Gallbladder: No shadowing gallstones or echogenic sludge. No gallbladder wall thickening or pericholecystic fluid. The gallbladder wall thickness measured 2.1 mm. No sonographic Murphy's sign according to the ultrasound technologist.  CBD: Normal in caliber measuring 4.5 mm. No choledocholithiasis is evident.  Liver:  Normal size and echotexture without focal parenchymal abnormality.  IVC:  Patent throughout its visualized course in the abdomen.  Pancreas: Pancreatic tissue was obscured by bowel gas.  No pseudocyst could be identified.  No mass or abscess could be identified.  Spleen:  Normal size and echotexture without focal abnormality.  Right kidney:  No hydronephrosis.  Well-preserved cortex.  Normal parenchymal echotexture without focal abnormalities.  Right renal length is 11.4 cm.  Left kidney:  No hydronephrosis.  Well-preserved cortex.  Normal parenchymal echotexture without focal abnormalities.  Left renal length is 11.1 cm.  Aorta:  Maximum diameter is 3.0 cm.  No aneurysm is evident.  Ascites:  None.  IMPRESSION: No abdominal pathology was demonstrated.  Pancreas was obscured by bowel gas.  No pseudocyst could be identified.  No mass or abscess could be identified.  No cholelithiasis was seen.  Original Report Authenticated By: Crawford Givens, M.D.    Assessment/Plan Principal Problem:  *Acute alcoholic pancreatitis: Secondary  to etoh use (less likely secondary to ischemia from cocaine). Lipase better. Still getting dilaudid frequently for abdominal pain. Continue ice chips only. May be ready for clear liquids tomorrow.  Active Problems:  HTN (hypertension), uncontrolled. Per hospitalist.  Cocaine abuse  Alcohol abuse  Tobacco abuse  Hematemesis, no further episodes. EGD prior to discharge with deep sedation due to polysubstance abuse.    Hyperkalemia: Per hospitalist.   LOS: 2 days   Tana Coast  07/26/2011, 9:23 AM

## 2011-07-27 ENCOUNTER — Other Ambulatory Visit: Payer: Self-pay

## 2011-07-27 ENCOUNTER — Inpatient Hospital Stay (HOSPITAL_COMMUNITY): Payer: Self-pay

## 2011-07-27 DIAGNOSIS — K859 Acute pancreatitis without necrosis or infection, unspecified: Secondary | ICD-10-CM

## 2011-07-27 DIAGNOSIS — K920 Hematemesis: Secondary | ICD-10-CM

## 2011-07-27 LAB — POTASSIUM: Potassium: 3.6 mEq/L (ref 3.5–5.1)

## 2011-07-27 MED ORDER — AMLODIPINE BESYLATE 5 MG PO TABS
10.0000 mg | ORAL_TABLET | Freq: Every day | ORAL | Status: DC
  Administered 2011-07-27 – 2011-07-29 (×3): 10 mg via ORAL
  Filled 2011-07-27 (×3): qty 2

## 2011-07-27 MED ORDER — CLONIDINE HCL 0.2 MG PO TABS
0.2000 mg | ORAL_TABLET | ORAL | Status: DC | PRN
  Administered 2011-07-27 (×2): 0.2 mg via ORAL
  Filled 2011-07-27 (×2): qty 1

## 2011-07-27 MED ORDER — SODIUM CHLORIDE 0.9 % IJ SOLN
INTRAMUSCULAR | Status: AC
  Filled 2011-07-27: qty 10

## 2011-07-27 MED ORDER — POLYETHYLENE GLYCOL 3350 17 G PO PACK
17.0000 g | PACK | Freq: Every day | ORAL | Status: DC | PRN
  Administered 2011-07-27: 19:00:00 via ORAL
  Filled 2011-07-27: qty 1

## 2011-07-27 MED ORDER — PANTOPRAZOLE SODIUM 40 MG PO TBEC
40.0000 mg | DELAYED_RELEASE_TABLET | Freq: Two times a day (BID) | ORAL | Status: DC
  Administered 2011-07-27 – 2011-07-29 (×4): 40 mg via ORAL
  Filled 2011-07-27 (×4): qty 1

## 2011-07-27 MED ORDER — LORAZEPAM 2 MG/ML IJ SOLN: 2.0000 mg | Freq: Once | INTRAMUSCULAR | Status: DC

## 2011-07-27 MED ORDER — BISACODYL 10 MG RE SUPP: 10.0000 mg | RECTAL | Status: DC | PRN

## 2011-07-27 MED ORDER — VITAMIN B-1 100 MG PO TABS
100.0000 mg | ORAL_TABLET | Freq: Every day | ORAL | Status: DC
  Administered 2011-07-27 – 2011-07-29 (×3): 100 mg via ORAL
  Filled 2011-07-27 (×3): qty 1

## 2011-07-27 MED ORDER — AMLODIPINE BESYLATE 5 MG PO TABS: 5.0000 mg | ORAL_TABLET | Freq: Every day | ORAL | Status: DC

## 2011-07-27 MED ORDER — ENALAPRILAT 1.25 MG/ML IV SOLN
1.2500 mg | Freq: Four times a day (QID) | INTRAVENOUS | Status: DC | PRN
  Administered 2011-07-27: 1.25 mg via INTRAVENOUS
  Filled 2011-07-27: qty 2

## 2011-07-27 MED ORDER — POTASSIUM CHLORIDE IN NACL 40-0.9 MEQ/L-% IV SOLN
INTRAVENOUS | Status: DC
  Administered 2011-07-27 (×2): via INTRAVENOUS

## 2011-07-27 MED ORDER — HYDRALAZINE HCL 20 MG/ML IJ SOLN: 10.0000 mg | INTRAMUSCULAR | Status: DC | PRN

## 2011-07-27 MED ORDER — LORAZEPAM 2 MG/ML IJ SOLN
1.0000 mg | Freq: Once | INTRAMUSCULAR | Status: AC
  Administered 2011-07-27: 1 mg via INTRAVENOUS
  Filled 2011-07-27: qty 1

## 2011-07-27 MED ORDER — FLEET ENEMA 7-19 GM/118ML RE ENEM: 1.0000 | ENEMA | RECTAL | Status: DC | PRN

## 2011-07-27 NOTE — Progress Notes (Signed)
Subjective: Patient states he is hungry; his abdominal pain is essentially unchanged located in upper mid abdomen; he denies nausea or vomiting; he is passing flatus.  Objective: Vital signs in last 24 hours: Temp:  [98 F (36.7 C)-99.7 F (37.6 C)] 99.7 F (37.6 C) (07/28 0640) Pulse Rate:  [75-108] 108  (07/28 0915) Resp:  [18-20] 18  (07/28 0822) BP: (131-205)/(92-145) 131/92 mmHg (07/28 0915) SpO2:  [96 %-100 %] 97 % (07/28 0822) Weight change:  Last BM Date: 07/24/11  PHYSICAL EXAM He is alert and responds appropriately to questions; His bowel sounds are normal abdomen is soft with moderate mid epigastric tenderness with some guarding. He does not have peripheral edema.  Intake/Output from previous day: 07/27 0701 - 07/28 0700 In: 3954.6 [P.O.:1330; I.V.:2414.6; IV Piggyback:210] Out: 600 [Urine:600]  Lab Results: Serum K; 3.6 mEq per liter. Lipase 674 on 07/25/2011. Lipase 169 on 07/26/2011. Head CT this morning without contrast is negative.  Assessment; Alcoholic pancreatitis; he appears to be gradually improving; He is currently n.p.o. I believe he could be started back on clear liquids if alright with Dr. Lendell Caprice. Confusion; secondary to meds and alcohol withdrawal. Plan; Will check serum amylase lipase and be matched in a.m.Marland Kitchen

## 2011-07-27 NOTE — Progress Notes (Signed)
Subjective: No new  complaints  Objective: Vital signs in last 24 hours: Temp:  [98 F (36.7 C)-99.7 F (37.6 C)] 99.2 F (37.3 C) (07/28 1400) Pulse Rate:  [75-108] 93  (07/28 1400) Resp:  [18-20] 18  (07/28 1400) BP: (131-205)/(92-145) 156/103 mmHg (07/28 1400) SpO2:  [96 %-100 %] 96 % (07/28 1400) Weight change:  Last BM Date: 07/24/11  Intake/Output from previous day: 07/27 0701 - 07/28 0700 In: 3954.6 [P.O.:1330; I.V.:2414.6; IV Piggyback:210] Out: 600 [Urine:600] Intake/Output this shift: I/O this shift: In: 480 [P.O.:480] Out: 240 [Urine:240]  General appearance: asleep, arousable Resp: clear to auscultation bilaterally Cardio: regular rate and rhythm, S1, S2 normal, no murmur, click, rub or gallop GI: Soft, mild tenderness. bowel sounds present Extremities: extremities normal, atraumatic, no cyanosis or edema Neurologic: No tremmor, oriented and appropriate  Lab Results:  Basename 07/26/11 0438 07/26/11  WBC 5.5 5.7  HGB 13.4 13.8  HCT 38.7* 39.3  PLT 147* 150   BMET  Basename 07/27/11 0425 07/26/11 0438 07/25/11 1133  NA -- 133* 134*  K 3.6 3.3* --  CL -- 96 97  CO2 -- 24 23  GLUCOSE -- 145* 143*  BUN -- 3* 6  CREATININE -- 0.54 0.63  CALCIUM -- 8.9 9.1   Assessment/Plan: Principal Problem:  *Acute alcoholic pancreatitis:  Agree with clears Active Problems:  HTN (hypertension): uncontrolled. Continue clonidine.  Amlodipine resumed with multiple PRN antihypertensives. Decrease IVF to lessen salt load.  Cocaine abuse:  "I can stop whenever I want."  Has been in rehab before.  Doubt pt ready to quit using.  Seen by s.w.  Alcohol abuse:  See above  Hematemesis:  None further  Tobacco abuse Hypokalemia:  resolved    LOS: 3 days   [place] Hotz L 07/27/2011, 3:06 PM

## 2011-07-28 LAB — BASIC METABOLIC PANEL
Calcium: 9.6 mg/dL (ref 8.4–10.5)
GFR calc Af Amer: >60 mL/min (ref >60)
GFR calc non Af Amer: >60 mL/min (ref >60)
Sodium: 132 mEq/L — ABNORMAL LOW (ref 135–145)

## 2011-07-28 LAB — LIPASE, BLOOD: Lipase: 120 U/L — ABNORMAL HIGH (ref 11–59)

## 2011-07-28 MED ORDER — CLONIDINE HCL 0.1 MG PO TABS
0.1000 mg | ORAL_TABLET | Freq: Three times a day (TID) | ORAL | Status: DC
  Administered 2011-07-28 – 2011-07-29 (×4): 0.1 mg via ORAL
  Filled 2011-07-28 (×4): qty 1

## 2011-07-28 MED ORDER — SODIUM CHLORIDE 0.9 % IV SOLN: 250.0000 mL | INTRAVENOUS | Status: DC

## 2011-07-28 MED ORDER — SODIUM CHLORIDE 0.9 % IJ SOLN
INTRAMUSCULAR | Status: AC
  Filled 2011-07-28: qty 3

## 2011-07-28 MED ORDER — SODIUM CHLORIDE 0.9 % IJ SOLN
3.0000 mL | Freq: Two times a day (BID) | INTRAMUSCULAR | Status: DC
  Administered 2011-07-28 – 2011-07-29 (×2): 3 mL via INTRAVENOUS
  Filled 2011-07-28 (×2): qty 3

## 2011-07-28 MED ORDER — SODIUM CHLORIDE 0.9 % IJ SOLN: 3.0000 mL | INTRAMUSCULAR | Status: DC | PRN

## 2011-07-28 NOTE — Progress Notes (Signed)
Subjective: No new  complaints  Objective: Vital signs in last 24 hours: Temp:  [98 F (36.7 C)-99.2 F (37.3 C)] 98 F (36.7 C) (07/29 0600) Pulse Rate:  [84-93] 84  (07/29 0600) Resp:  [18-20] 20  (07/29 0600) BP: (145-180)/(97-112) 148/100 mmHg (07/29 0600) SpO2:  [96 %-99 %] 97 % (07/29 0600) Weight change:  Last BM Date: 07/24/11  Intake/Output from previous day: 07/28 0701 - 07/29 0700 In: 3192.5 [P.O.:960; I.V.:2222.5; IV Piggyback:10] Out: 240 [Urine:240] Intake/Output this shift: I/O this shift: In: 480 [P.O.:480] Out: -   General appearance: More alert. Appropriate. Orientedl Resp: clear to auscultation bilaterally Cardio: regular rate and rhythm, S1, S2 normal, no murmur, click, rub or gallop GI: Soft, mild tenderness. bowel sounds present Extremities: extremities normal, atraumatic, no cyanosis or edema Neurologic: No tremmor, oriented and appropriate  Lab Results:  Basename 07/26/11 0438 07/26/11  WBC 5.5 5.7  HGB 13.4 13.8  HCT 38.7* 39.3  PLT 147* 150   BMET  Basename 07/28/11 0444 07/27/11 0425 07/26/11 0438  NA 132* -- 133*  K 4.1 3.6 --  CL 97 -- 96  CO2 21 -- 24  GLUCOSE 107* -- 145*  BUN 6 -- 3*  CREATININE 0.62 -- 0.54  CALCIUM 9.6 -- 8.9   Assessment/Plan: Principal Problem:  *Acute alcoholic pancreatitis:  Agree with full liquids. D/c IVF Active Problems:  HTN (hypertension): uncontrolled. Continue clonidine.  Amlodipine resumed with multiple PRN antihypertensives.   Cocaine abuse:  "I can stop whenever I want."  Has been in rehab before.  Doubt pt ready to quit using.  Seen by s.w.  Alcohol abuse:  See above  Hematemesis:  None further  Tobacco abuse Hypokalemia:  resolved    LOS: 4 days   Cyenna Rebello L 07/28/2011, 10:35 AM

## 2011-07-28 NOTE — Progress Notes (Signed)
Subjective: Patient states he feels much better. He denies abdominal pain nausea or vomiting. He is passing flatness but has not had any stool; he is hungry.  Objective: Vital signs in last 24 hours: Temp:  [98 F (36.7 C)-99.2 F (37.3 C)] 98 F (36.7 C) (07/29 0600) Pulse Rate:  [84-93] 84  (07/29 0600) Resp:  [18-20] 20  (07/29 0600) BP: (145-180)/(97-112) 148/100 mmHg (07/29 0600) SpO2:  [96 %-99 %] 97 % (07/29 0600) Weight change:  Last BM Date: 07/24/11  PHYSICAL EXAM Patient is alert and oriented x3; His abdomen is symmetrical and bowel sounds are normal. Abdomen is soft some guarding in epigastric region. He states he is ticklish.  Intake/Output from previous day: 07/28 0701 - 07/29 0700 In: 3192.5 [P.O.:960; I.V.:2222.5; IV Piggyback:10] Out: 240 [Urine:240]  Lab Results: Serum amylase is 125; Serum lipase is 120; Serum sodium 132, potassium 4.1, chloride 97, CO2 is 21, glucose 107, BUN 6. creatinine 0.6 and calcium is 9.6     Assesment: #1 alcoholic pancreatitis.    He is feeling much better his pain has resolved and his vital signs are stable; his serum lipase is coming down serum amylase is mildly elevated however we do not have a baseline. #2 history of hematemesis.    This is to be worked up on an outpatient basis as planned and Dr. Darrick Penna note.  Plan: Advance diet  to full liquids. Repeat serum amylase and lipase in a.m to make sure there is no bump now that he's eating again.

## 2011-07-29 DIAGNOSIS — K920 Hematemesis: Secondary | ICD-10-CM

## 2011-07-29 DIAGNOSIS — K859 Acute pancreatitis without necrosis or infection, unspecified: Secondary | ICD-10-CM

## 2011-07-29 LAB — LIPASE, BLOOD: Lipase: 103 U/L — ABNORMAL HIGH (ref 11–59)

## 2011-07-29 MED ORDER — CLONIDINE HCL 0.1 MG PO TABS: 0.3000 mg | ORAL_TABLET | Freq: Two times a day (BID) | ORAL | Status: DC

## 2011-07-29 MED ORDER — OMEPRAZOLE 40 MG PO CPDR: 40.0000 mg | DELAYED_RELEASE_CAPSULE | Freq: Every day | ORAL | Status: DC

## 2011-07-29 MED ORDER — CLONIDINE HCL 0.3 MG PO TABS: 0.3000 mg | ORAL_TABLET | Freq: Two times a day (BID) | ORAL | Status: DC

## 2011-07-29 NOTE — Discharge Summary (Signed)
Physician Discharge Summary  Patient ID: Paul [name] MRN: [id number] DOB/AGE: 05/25/1966 45 y.o.  Admit date: 07/24/2011 Discharge date: 07/29/2011  Discharge Diagnoses:  Principal Problem:  *Acute alcoholic pancreatitis Active Problems:  HTN (hypertension)  Cocaine abuse  Alcohol abuse  Hematemesis  Tobacco abuse   Current Discharge Medication List    START taking these medications   Details  cloNIDine (CATAPRES) 0.1 MG tablet Take 3 tablets (0.3 mg total) by mouth 2 (two) times daily. Qty: 60 tablet, Refills: 0    omeprazole (PRILOSEC) 40 MG capsule Take 1 capsule (40 mg total) by mouth daily. Qty: 30 capsule, Refills: 0      CONTINUE these medications which have NOT CHANGED   Details  magnesium hydroxide (MILK OF MAGNESIA) 400 MG/5ML suspension Take 15 mLs by mouth daily as needed. For stomach pain       STOP taking these medications     bismuth subsalicylate (PEPTO BISMOL) 262 MG chewable tablet         Discharge Orders    Future Orders Please Complete By Expires   Diet - low sodium heart healthy      Discharge instructions      Comments:   No alcohol or drugs      Follow-up Information    Follow up with Eula Listen, MD in 3 weeks.   Contact information:   [address] [place] [address] [phone]          Disposition: Home or Self Care  Discharged Condition: stable  Consults: Treatment Team:  Corbin Ade, MD Tana Coast, PA Arlyce Harman, MD  Labs:   Results for orders placed during the hospital encounter of 07/24/11 (from the past 48 hour(s))  AMYLASE     Status: Abnormal   Collection Time   07/28/11  4:44 AM      Component Value Range Comment   Amylase 125 (*) 0 - 105 (U/L)   LIPASE, BLOOD     Status: Abnormal   Collection Time   07/28/11  4:44 AM      Component Value Range Comment   Lipase 120 (*) 11 - 59 (U/L)   BASIC METABOLIC PANEL     Status: Abnormal   Collection  Time   07/28/11  4:44 AM      Component Value Range Comment   Sodium 132 (*) 135 - 145 (mEq/L)    Potassium 4.1  3.5 - 5.1 (mEq/L)    Chloride 97  96 - 112 (mEq/L)    CO2 21  19 - 32 (mEq/L)    Glucose, Bld 107 (*) 70 - 99 (mg/dL)    BUN 6  6 - 23 (mg/dL)    Creatinine, Ser 4.25  0.50 - 1.35 (mg/dL)    Calcium 9.6  8.4 - 10.5 (mg/dL)    GFR calc non Af Amer >60  >60 (mL/min)    GFR calc Af Amer >60  >60 (mL/min)   AMYLASE     Status: Abnormal   Collection Time   07/29/11  5:02 AM      Component Value Range Comment   Amylase 146 (*) 0 - 105 (U/L)   LIPASE, BLOOD     Status: Abnormal   Collection Time   07/29/11  5:02 AM      Component Value Range Comment   Lipase 103 (*) 11 - 59 (U/L)     Diagnostics:  Ct Head Wo Contrast  07/27/2011  *  RADIOLOGY REPORT*  Clinical Data: Confusion, hypertension  CT HEAD WITHOUT CONTRAST  Technique:  Contiguous axial images were obtained from the base of the skull through the vertex without contrast.  Comparison: 05/06/2009  Findings: No acute hemorrhage, acute infarction, or mass lesion is identified.  No midline shift.  No ventriculomegaly.  No skull fracture.  Orbits and paranasal sinuses are intact.  Soft tissue opacity in the region of the nasal bones is again noted, which may indicate polyp but is incompletely imaged.  IMPRESSION: No acute intracranial finding.  Original Report Authenticated By: Harrel Lemon, M.D.   US Abdomen Complete  07/25/2011  *RADIOLOGY REPORT*  Clinical Data:  History of pancreatitis.  ABDOMINAL ULTRASOUND COMPLETE  Comparison:  CT 07/25/2011.  Findings:  Gallbladder: No shadowing gallstones or echogenic sludge. No gallbladder wall thickening or pericholecystic fluid. The gallbladder wall thickness measured 2.1 mm. No sonographic Murphy's sign according to the ultrasound technologist.  CBD: Normal in caliber measuring 4.5 mm. No choledocholithiasis is evident.  Liver:  Normal size and echotexture without focal parenchymal  abnormality.  IVC:  Patent throughout its visualized course in the abdomen.  Pancreas: Pancreatic tissue was obscured by bowel gas.  No pseudocyst could be identified.  No mass or abscess could be identified.  Spleen:  Normal size and echotexture without focal abnormality.  Right kidney:  No hydronephrosis.  Well-preserved cortex.  Normal parenchymal echotexture without focal abnormalities.  Right renal length is 11.4 cm.  Left kidney:  No hydronephrosis.  Well-preserved cortex.  Normal parenchymal echotexture without focal abnormalities.  Left renal length is 11.1 cm.  Aorta:  Maximum diameter is 3.0 cm.  No aneurysm is evident.  Ascites:  None.  IMPRESSION: No abdominal pathology was demonstrated.  Pancreas was obscured by bowel gas.  No pseudocyst could be identified.  No mass or abscess could be identified.  No cholelithiasis was seen.  Original Report Authenticated By: Crawford Givens, M.D.   Ct Abdomen Pelvis W Contrast  07/25/2011  *RADIOLOGY REPORT*  Clinical Data: In  CT ABDOMEN AND PELVIS WITH CONTRAST  Technique:  Multidetector CT imaging of the abdomen and pelvis was performed following the standard protocol during bolus administration of intravenous contrast.  Contrast: 100 ml Omnipaque-300 intravenous contrast  Comparison: 05/06/2009  Findings: Limited images through the lung bases demonstrate no significant appreciable abnormality. The heart size is within normal limits. No pleural or pericardial effusion.  The distal esophagus is mildly thickened with intraluminal fluid.  There is peripancreatic fat stranding in keeping with acute pancreatitis.  Stranding / ill-defined fluid extends into the left anterior pararenal space.  The gland enhances homogeneously, without evidence for necrosis.  No pseudocyst or abscess identified.  Unremarkable liver, biliary system, spleen, and adrenal glands. Nonobstructing interpolar right renal stone.  Otherwise symmetric renal enhancement.  No hydronephrosis.   Inflammation abuts the descending colon, gastric antrum, and several proximal jejunal bowel loops.  Otherwise, bowel is of normal course and caliber.  Normal appendix.  No loculated fluid collection or free intraperitoneal air.  Vasculature within normal limits.  Splenic vein remains patent.  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  IMPRESSION: Findings in keeping with acute pancreatitis.  No pseudocyst or abscess identified.  Inflammatory changes abut adjacent bowel loops as described above however no evidence for bowel perforation.  Original Report Authenticated By: Waneta Martins, M.D.   Full Code   Hospital Course: The patient is an unassigned 45 year old black male who presents to the  emergency room with abdominal pain.  He had multiple episodes of vomiting. Initially the emesis was clear and subsequently became blood tinged. He had no hematochezia or melena. He has a history of polysubstance abuse including alcohol, crack cocaine and cigarettes. He also has a history of untreated hypertension.  In the emergency room, he had a blood pressure of 166/112 and a pulse of 107. He had otherwise normal vital signs. He was alert and oriented. He was cooperative. He had epigastric tenderness. He was found to have chemical and radiologic pancreatitis 22 alcohol abuse. Patient was started on clonidine and Ativan as needed IV fluids anti-emetics and pain medications. He was made n.p.o. GI was consulted. His abdominal pain and nausea improved. At the time of discharge, he was tolerating a solid diet and his pain was gone. His blood pressure proved difficult to control and he required multiple IV medications for treatment.  He was placed on twice a day protonix empirically due to the history of hematemesis. He will followup with Dr. Carolan Clines as an outpatient for elective EGD and colonoscopy.  Patient has no primary care physician and is self-pay. He will be referred to the health Department  and care management will assist with outpatient medications.  He received drug and alcohol cessation counseling by multiple practitioners. He is not interested in referral to rehabilitation, stating that he can "stop at any time". The patient is homeless and has been using for decades. He does not appear to be willing or ready to quit drinking and using drugs. He voices understanding that his pancreatitis is most likely a result of his alcohol use. He voices understanding that he needs better control of his blood pressure.  Total time on the day of discharge is greater than 30 minutes. Discharge Exam: Blood pressure 147/101, pulse 88, temperature 98.7 F (37.1 C), temperature source Oral, resp. rate 20, height 6' (1.829 m), weight 78.654 kg (173 lb 6.4 oz), SpO2 98.00%.  Unchanged from 07/28/11   Signed: Crista Curb L 07/29/2011, 10:59 AM

## 2011-07-29 NOTE — Progress Notes (Signed)
07/29/11 1155 Patient being discharged home today. Reviewed discharge instructions with patient, given copy of instructions, med list, carenotes, prescriptions. Given voucher for prescriptions per case management. Encouraged to stop smoking, and avoid alcohol and/or drugs. Patient stated "i'm gone keep smoking, but i'll give up the alcohol and drugs". Denies pain or discomfort prior to discharge. Follow up appointments with Dr Jena Gauss and [facility] set up as ordered. Pt in stable condition awaiting nurse tech assist for discharge home.

## 2011-07-29 NOTE — Progress Notes (Signed)
Subjective: Pt denies any abdominal pain.  No nausea or vomiting.  "Ready to go home"  Tolerating diet well.  Regular daily soft brown BMs.  Objective: Vital signs in last 24 hours: Temp:  [97.9 F (36.6 C)-98.7 F (37.1 C)] 98.7 F (37.1 C) (07/30 0627) Pulse Rate:  [84-88] 88  (07/30 0627) Resp:  [20] 20  (07/30 0627) BP: (115-148)/(79-105) 147/101 mmHg (07/30 0627) SpO2:  [97 %-99 %] 98 % (07/30 0627) Last BM Date: 07/24/11 General:   Alert,  Well-developed, well-nourished, pleasant and cooperative in NAD. Eyes:  Sclera clear, no icterus.   Conjunctiva pink. Mouth:  OP pink & moist. Heart:  Regular rate and rhythm; no murmurs, clicks, rubs,  or gallops. Abdomen:  Soft, nontender and nondistended. No masses, hepatosplenomegaly or hernias noted. Normal bowel sounds, without guarding, and without rebound.   Extremities:  Without clubbing or edema. Neurologic:  Alert and  oriented x4;  grossly normal neurologically. Skin:  Intact without significant lesions or rashes. Psych:  Alert and cooperative. Normal mood and affect.  Lab Results: Amylase 146 Lipase 103 BMET  Basename 07/28/11 0444 07/27/11 0425  NA 132* --  K 4.1 3.6  CL 97 --  CO2 21 --  GLUCOSE 107* --  BUN 6 --  CREATININE 0.62 --  CALCIUM 9.6 --   Assessment: #1 Acute alcoholic pancreatitis:  Suspect secondary to etoh.  Less likely ischemia.  Much improved.  Hopeful d/c home soon. #2 Hematemesis: Mallory-Weiss vs. Esophagitis.  Outpatient EGD planned in 2-3 weeks w/ Dr Jena Gauss. #3 Due for screening colonoscopy as outpt #4 Polysubstance abuse:  Pt encouraged cessation.  Will need neg UDS prior to procedures. Plan: Advance to low fat diet. OP procedures as planned 2-3 wks. Hopeful DC home soon.   LOS: 5 days   Paul [name]  07/29/2011, 7:54 AM   OPV in 2-3 weeks. Procedures to follow.

## 2011-08-19 ENCOUNTER — Inpatient Hospital Stay: Payer: Self-pay | Admitting: Gastroenterology

## 2011-08-19 ENCOUNTER — Telehealth: Payer: Self-pay | Admitting: Gastroenterology

## 2011-08-19 NOTE — Telephone Encounter (Signed)
Please send letter to pt to have appt rescheduled. Needs to be seen in hospital f/u.

## 2011-08-27 ENCOUNTER — Encounter: Payer: Self-pay | Admitting: Gastroenterology

## 2011-08-27 NOTE — Telephone Encounter (Signed)
Pt is aware of OV for 9/12 @ 1030 with [facility] and appt card was mailed

## 2011-08-29 NOTE — Progress Notes (Signed)
Encounter addended by: Clarene Critchley on: 08/29/2011  7:41 AM<BR>     Documentation filed: Flowsheet VN

## 2011-09-11 ENCOUNTER — Inpatient Hospital Stay: Payer: Self-pay | Admitting: Gastroenterology

## 2011-09-11 ENCOUNTER — Telehealth: Payer: Self-pay | Admitting: Gastroenterology

## 2011-09-11 NOTE — Telephone Encounter (Signed)
Routed to provider

## 2011-09-18 LAB — BASIC METABOLIC PANEL
Chloride: 99
GFR calc non Af Amer: >60
Glucose, Bld: 98
Potassium: 4.1
Sodium: 136

## 2011-09-18 LAB — URINALYSIS, ROUTINE W REFLEX MICROSCOPIC
Leukocytes, UA: NEGATIVE
Nitrite: POSITIVE — AB
Urobilinogen, UA: 0.2

## 2011-09-18 LAB — DIFFERENTIAL
Eosinophils Relative: 0
Lymphocytes Relative: 24
Lymphs Abs: 1.3
Monocytes Absolute: 0.5
Monocytes Relative: 9

## 2011-09-18 LAB — RAPID URINE DRUG SCREEN, HOSP PERFORMED
Amphetamines: NOT DETECTED
Benzodiazepines: NOT DETECTED
Tetrahydrocannabinol: NOT DETECTED

## 2011-09-18 LAB — ETHANOL: Alcohol, Ethyl (B): <5

## 2011-09-18 LAB — CBC
HCT: 46.6
Hemoglobin: 15.9
MCV: 97.6
WBC: 5.5

## 2011-09-18 LAB — URINE MICROSCOPIC-ADD ON

## 2011-09-23 LAB — RAPID URINE DRUG SCREEN, HOSP PERFORMED
Opiates: NOT DETECTED
Tetrahydrocannabinol: NOT DETECTED

## 2011-09-23 LAB — BASIC METABOLIC PANEL
CO2: 27
Chloride: 107
Creatinine, Ser: 0.94
GFR calc Af Amer: >60

## 2011-09-23 LAB — DIFFERENTIAL
Basophils Relative: 0
Eosinophils Absolute: 0.1
Monocytes Absolute: 0.5
Monocytes Relative: 7
Neutrophils Relative %: 46

## 2011-09-23 LAB — CBC
MCHC: 35.4
MCV: 97.2
RBC: 4.61

## 2011-09-23 LAB — ETHANOL: Alcohol, Ethyl (B): 271 — ABNORMAL HIGH

## 2011-11-27 NOTE — Telephone Encounter (Signed)
Second no-show. Needs letter for f/u.

## 2011-12-03 ENCOUNTER — Encounter: Payer: Self-pay | Admitting: Gastroenterology

## 2011-12-03 NOTE — Telephone Encounter (Signed)
Letter was mailed to patient to call office and [facility] OV

## 2012-03-20 ENCOUNTER — Inpatient Hospital Stay (HOSPITAL_COMMUNITY)
Admission: EM | Admit: 2012-03-20 | Discharge: 2012-03-22 | DRG: 440 | Disposition: A | Discharge status: Home / Self Care | Payer: Self-pay | Attending: Internal Medicine | Admitting: Internal Medicine

## 2012-03-20 ENCOUNTER — Encounter (HOSPITAL_COMMUNITY): Payer: Self-pay | Admitting: Emergency Medicine

## 2012-03-20 DIAGNOSIS — R112 Nausea with vomiting, unspecified: Secondary | ICD-10-CM | POA: Diagnosis present

## 2012-03-20 DIAGNOSIS — K852 Alcohol induced acute pancreatitis without necrosis or infection: Secondary | ICD-10-CM | POA: Diagnosis present

## 2012-03-20 DIAGNOSIS — F1010 Alcohol abuse, uncomplicated: Secondary | ICD-10-CM | POA: Diagnosis present

## 2012-03-20 DIAGNOSIS — F1410 Cocaine abuse, uncomplicated: Secondary | ICD-10-CM | POA: Diagnosis present

## 2012-03-20 DIAGNOSIS — Z720 Tobacco use: Secondary | ICD-10-CM | POA: Diagnosis present

## 2012-03-20 DIAGNOSIS — I10 Essential (primary) hypertension: Secondary | ICD-10-CM | POA: Diagnosis present

## 2012-03-20 DIAGNOSIS — E785 Hyperlipidemia, unspecified: Secondary | ICD-10-CM | POA: Diagnosis present

## 2012-03-20 DIAGNOSIS — K859 Acute pancreatitis without necrosis or infection, unspecified: Principal | ICD-10-CM

## 2012-03-20 DIAGNOSIS — F17200 Nicotine dependence, unspecified, uncomplicated: Secondary | ICD-10-CM | POA: Diagnosis present

## 2012-03-20 LAB — CBC
Hemoglobin: 16.3 g/dL (ref 13.0–17.0)
RBC: 4.86 MIL/uL (ref 4.22–5.81)
WBC: 5 10*3/uL (ref 4.0–10.5)

## 2012-03-20 LAB — DIFFERENTIAL
Basophils Absolute: 0 10*3/uL (ref 0.0–0.1)
Basophils Relative: 0 % (ref 0–1)
Eosinophils Absolute: 0 10*3/uL (ref 0.0–0.7)
Neutro Abs: 3.4 10*3/uL (ref 1.7–7.7)
Neutrophils Relative %: 69 % (ref 43–77)

## 2012-03-20 LAB — URINALYSIS, ROUTINE W REFLEX MICROSCOPIC
Glucose, UA: NEGATIVE mg/dL
Ketones, ur: 15 mg/dL — AB
Leukocytes, UA: NEGATIVE
Protein, ur: 30 mg/dL — AB
pH: 6 (ref 5.0–8.0)

## 2012-03-20 LAB — COMPREHENSIVE METABOLIC PANEL
AST: 22 U/L (ref 0–37)
Albumin: 4.4 g/dL (ref 3.5–5.2)
Alkaline Phosphatase: 75 U/L (ref 39–117)
BUN: 7 mg/dL (ref 6–23)
Chloride: 94 mEq/L — ABNORMAL LOW (ref 96–112)
Potassium: 4 mEq/L (ref 3.5–5.1)
Sodium: 135 mEq/L (ref 135–145)
Total Bilirubin: 1 mg/dL (ref 0.3–1.2)
Total Protein: 8.5 g/dL — ABNORMAL HIGH (ref 6.0–8.3)

## 2012-03-20 LAB — RAPID URINE DRUG SCREEN, HOSP PERFORMED
Cocaine: POSITIVE — AB
Opiates: NOT DETECTED

## 2012-03-20 LAB — URINE MICROSCOPIC-ADD ON

## 2012-03-20 LAB — LIPASE, BLOOD: Lipase: 1492 U/L — ABNORMAL HIGH (ref 11–59)

## 2012-03-20 MED ORDER — HYDROMORPHONE HCL PF 1 MG/ML IJ SOLN
1.0000 mg | Freq: Once | INTRAMUSCULAR | Status: AC
  Administered 2012-03-20: 1 mg via INTRAVENOUS
  Filled 2012-03-20: qty 1

## 2012-03-20 MED ORDER — ONDANSETRON HCL 4 MG/2ML IJ SOLN
4.0000 mg | Freq: Four times a day (QID) | INTRAMUSCULAR | Status: DC | PRN
  Administered 2012-03-21: 4 mg via INTRAVENOUS
  Filled 2012-03-20: qty 2

## 2012-03-20 MED ORDER — CLONIDINE HCL 0.2 MG PO TABS
0.3000 mg | ORAL_TABLET | Freq: Two times a day (BID) | ORAL | Status: DC
  Administered 2012-03-20 – 2012-03-22 (×5): 0.3 mg via ORAL
  Filled 2012-03-20 (×5): qty 1

## 2012-03-20 MED ORDER — SODIUM CHLORIDE 0.9 % IV BOLUS (SEPSIS)
1000.0000 mL | Freq: Once | INTRAVENOUS | Status: AC
  Administered 2012-03-20: 1000 mL via INTRAVENOUS

## 2012-03-20 MED ORDER — MORPHINE SULFATE 2 MG/ML IJ SOLN
2.0000 mg | INTRAMUSCULAR | Status: DC | PRN
  Administered 2012-03-20 – 2012-03-21 (×5): 2 mg via INTRAVENOUS
  Filled 2012-03-20 (×5): qty 1

## 2012-03-20 MED ORDER — POTASSIUM CHLORIDE IN NACL 40-0.9 MEQ/L-% IV SOLN
INTRAVENOUS | Status: DC
  Administered 2012-03-20 – 2012-03-21 (×2): via INTRAVENOUS
  Administered 2012-03-21: 20 mL/h via INTRAVENOUS
  Filled 2012-03-20 (×6): qty 1000

## 2012-03-20 MED ORDER — ONDANSETRON HCL 4 MG/2ML IJ SOLN
4.0000 mg | Freq: Once | INTRAMUSCULAR | Status: AC
  Administered 2012-03-20: 4 mg via INTRAVENOUS
  Filled 2012-03-20: qty 2

## 2012-03-20 MED ORDER — ONDANSETRON HCL 4 MG PO TABS: 4.0000 mg | ORAL_TABLET | Freq: Four times a day (QID) | ORAL | Status: DC | PRN

## 2012-03-20 MED ORDER — CLONIDINE HCL 0.2 MG PO TABS
0.2000 mg | ORAL_TABLET | Freq: Once | ORAL | Status: AC
  Administered 2012-03-20: 0.2 mg via ORAL
  Filled 2012-03-20: qty 1

## 2012-03-20 MED ORDER — PANTOPRAZOLE SODIUM 40 MG PO TBEC
40.0000 mg | DELAYED_RELEASE_TABLET | Freq: Every day | ORAL | Status: DC
  Administered 2012-03-20 – 2012-03-22 (×3): 40 mg via ORAL
  Filled 2012-03-20 (×3): qty 1

## 2012-03-20 MED ORDER — HEPARIN SODIUM (PORCINE) 5000 UNIT/ML IJ SOLN
5000.0000 [IU] | Freq: Three times a day (TID) | INTRAMUSCULAR | Status: DC
  Administered 2012-03-20 – 2012-03-22 (×6): 5000 [IU] via SUBCUTANEOUS
  Filled 2012-03-20 (×6): qty 1

## 2012-03-20 NOTE — ED Notes (Signed)
History of pancreastitis. Pt states pain started last night. Vomiting some intermittently.

## 2012-03-20 NOTE — H&P (Signed)
Paul [name] MRN: [id number] DOB/AGE: 1966/07/21 45 y.o.  Admit date: 03/20/2012 Chief Complaint: Abdominal pain, nausea and vomiting. HPI: This 46 year old man, who has a previous history of alcoholic pancreatitis presents with the above symptoms which started yesterday evening. He admits to drinking 3 beers yesterday and also taking cocaine. He was hospitalized with similar presentation in July of 2012. He also has hypertension and he says he restarted taking medications only this week.  Past Medical History  Diagnosis Date  . Pancreatitis, acute     First episode earlier in 2012  . Hypertension     noncompliance  . Hyperlipidemia     noncompliance  . Lung collapse     h/o   Past Surgical History  Procedure Date  . Mandible fracture surgery 2006  . Left axillary     surgery for deep laceration        Family History  Problem Relation Age of Onset  . Diabetes type II Mother   . Liver disease Neg Hx   . Colon cancer Neg Hx   . GI problems Neg Hx     Social History:  reports that he has been smoking Cigarettes.  He has been smoking about 1 pack per day. He reports that he drinks alcohol. He reports that he uses illicit drugs (Cocaine) about 3 times per week.he is single and lives alone.   Allergies: No Known Allergies  Medications Prior to Admission  Medication Dose Route Frequency Provider Last Rate Last Dose  . 0.9 % NaCl with KCl 40 mEq / L  infusion   Intravenous Continuous Mina Carlisi C Savina Olshefski, MD      . cloNIDine (CATAPRES) tablet 0.2 mg  0.2 mg Oral Once Tammy L. Triplett, PA   0.2 mg at 03/20/12 1151  . cloNIDine (CATAPRES) tablet 0.3 mg  0.3 mg Oral BID Fifi Schindler C Willo Yoon, MD      . heparin injection 5,000 Units  5,000 Units Subcutaneous Q8H Etoy Mcdonnell C Angila Wombles, MD      . HYDROmorphone (DILAUDID) injection 1 mg  1 mg Intravenous Once Tammy L. Triplett, PA   1 mg at 03/20/12 0955  . HYDROmorphone (DILAUDID) injection 1 mg  1 mg Intravenous Once Tammy L. Triplett, PA    1 mg at 03/20/12 1151  . morphine 2 MG/ML injection 2 mg  2 mg Intravenous Q3H PRN Buel Molder C Darriel Sinquefield, MD      . ondansetron (ZOFRAN) injection 4 mg  4 mg Intravenous Once Tammy L. Triplett, PA   4 mg at 03/20/12 0951  . ondansetron (ZOFRAN) tablet 4 mg  4 mg Oral Q6H PRN Donnelle Olmeda Normajean Glasgow, MD       Or  . ondansetron (ZOFRAN) injection 4 mg  4 mg Intravenous Q6H PRN Shirlean Berman C Nuriya Stuck, MD      . pantoprazole (PROTONIX) EC tablet 40 mg  40 mg Oral Q1200 Jalie Eiland C Melayna Robarts, MD      . sodium chloride 0.9 % bolus 1,000 mL  1,000 mL Intravenous Once Tammy L. Triplett, PA   1,000 mL at 03/20/12 4098   Medications Prior to Admission  Medication Sig Dispense Refill  . cloNIDine (CATAPRES) 0.3 MG tablet Take 1 tablet (0.3 mg total) by mouth 2 (two) times daily.  60 tablet  0  . magnesium hydroxide (MILK OF MAGNESIA) 400 MG/5ML suspension Take 15 mLs by mouth daily as needed. For stomach pain       . omeprazole (PRILOSEC) 40 MG capsule Take 1 capsule (40  mg total) by mouth daily.  30 capsule  0       [id number] from the symptoms mentioned above,there are no other symptoms referable to all systems reviewed.  Physical Exam: Blood pressure 167/126, pulse 93, temperature 97.6 F (36.4 C), temperature source Oral, resp. rate 18, height 6\' 1"  (1.854 m), weight 81.194 kg (179 lb), SpO2 97.00%. He looks systemically well. He does not appear to be in acute pain at the present time. His abdomen is soft and largely nontender. Bowel sounds are heard, albeit scanty. There is no hepatosplenomegaly. There are no masses felt. Heart sounds are present and normal. Lung fields are clear. He is alert and orientated without any focal neurological signs.    Basename 03/20/12 0939  WBC 5.0  NEUTROABS 3.4  HGB 16.3  HCT 47.3  MCV 97.3  PLT 222    Basename 03/20/12 0939  NA 135  K 4.0  CL 94*  CO2 28  GLUCOSE 114*  BUN 7  CREATININE 0.87  CALCIUM 9.9  MG --         No results found. Impression: 1. Acute  alcoholic pancreatitis. 2. Cocaine abuse. 3. Alcohol abuse. 4. Tobacco abuse. 5. Uncontrolled hypertension.     Plan: 1. Admit to the medical floor. 2. Clear fluid diet. IV fluids. 3. Analgesia as required. 4. Control hypertension. Further recommendations will depend on patient's hospital progress.      Wilson Singer Pager [phone]  03/20/2012, 1:40 PM

## 2012-03-20 NOTE — ED Provider Notes (Signed)
History     CSN: [id number]  Arrival date & time 03/20/12  7829   First MD Initiated Contact with Patient 03/20/12 [phone]      Chief Complaint  Patient presents with  . Abdominal Pain  . Emesis    (Consider location/radiation/quality/duration/timing/severity/associated sxs/prior treatment) HPI Comments: Patient with a history of pancreatitis comes to emergency department with complaint of gradual onset of diffuse abdominal pain and vomiting that began last evening. He states the vomiting has been intermittent but continuous through the evening and this morning. He states the pain feels similar to his previous episodes of pancreatitis. Patient was admitted to this hospital in July of 2012 for similar symptoms. He states he was advised to followup with GI but he has not done so.  He denies radiation of his pain describes the pain as intense and sharp. He denies hematemesis. He denies any change in bowel habit, chest pain, dyspnea, headache numbness or weakness. Patient is also hypertensive on his arrival today but states he has been taking hydrochlorothiazide that [facility] but has not been taking clonidine that he was prescribed in July due to the cost of the medication he was unable to afford it  Patient is a 46 y.o. male presenting with abdominal pain and vomiting. The history is provided by the patient. No language interpreter was used.  Abdominal Pain The primary symptoms of the illness include abdominal pain, nausea and vomiting. The primary symptoms of the illness do not include fever, fatigue, shortness of breath, diarrhea, hematemesis, hematochezia or dysuria. The current episode started yesterday. The onset of the illness was gradual. The problem has not changed since onset. The abdominal pain began yesterday. The pain came on gradually. The abdominal pain has been unchanged since its onset. The abdominal pain is generalized. The abdominal pain does not radiate.  The abdominal pain is relieved by nothing. The abdominal pain is exacerbated by vomiting.  Vomiting occurs 6 to 10 times per day. The emesis contains stomach contents.  The illness is associated with alcohol use. The patient has not had a change in bowel habit. Risk factors: History pancreatitis. Symptoms associated with the illness do not include chills, anorexia, diaphoresis, heartburn, constipation, urgency, hematuria, frequency or back pain. Associated medical issues comments: Pancreatitis.  Emesis  Associated symptoms include abdominal pain. Pertinent negatives include no arthralgias, no chills, no cough, no diarrhea, no fever, no headaches and no myalgias.    Past Medical History  Diagnosis Date  . Pancreatitis, acute     First episode earlier in 2012  . Hypertension     noncompliance  . Hyperlipidemia     noncompliance  . Lung collapse     h/o    Past Surgical History  Procedure Date  . Mandible fracture surgery 2006  . Left axillary     surgery for deep laceration    Family History  Problem Relation Age of Onset  . Diabetes type II Mother   . Liver disease Neg Hx   . Colon cancer Neg Hx   . GI problems Neg Hx     History  Substance Use Topics  . Smoking status: Current Everyday Smoker -- 1.0 packs/day    Types: Cigarettes  . Smokeless tobacco: Not on file  . Alcohol Use: Yes     Three 40oz per day      Review of Systems  Constitutional: Positive for appetite change. Negative for fever, chills, diaphoresis and fatigue.  HENT:  Negative for sore throat, trouble swallowing, neck pain and neck stiffness.   Respiratory: Negative for cough, shortness of breath and wheezing.   Cardiovascular: Negative for chest pain and palpitations.  Gastrointestinal: Positive for nausea, vomiting and abdominal pain. Negative for heartburn, diarrhea, constipation, blood in stool, hematochezia, abdominal distention, anal bleeding, anorexia and hematemesis.  Genitourinary: Negative  for dysuria, urgency, frequency, hematuria, flank pain and decreased urine volume.  Musculoskeletal: Negative for myalgias, back pain and arthralgias.  Skin: Negative.  Negative for rash.  Neurological: Negative for dizziness, speech difficulty, weakness, numbness and headaches.  Hematological: Does not bruise/bleed easily.  All other systems reviewed and are negative.    Allergies  Review of patient's allergies indicates no known allergies.  Home Medications   Current Outpatient Rx  Name Route Sig Dispense Refill  . CLONIDINE HCL 0.3 MG PO TABS Oral Take 1 tablet (0.3 mg total) by mouth 2 (two) times daily. 60 tablet 0  . MAGNESIUM HYDROXIDE 400 MG/5ML PO SUSP Oral Take 15 mLs by mouth daily as needed. For stomach pain     . OMEPRAZOLE 40 MG PO CPDR Oral Take 1 capsule (40 mg total) by mouth daily. 30 capsule 0    BP 167/126  Pulse 93  Temp(Src) 97.6 F (36.4 C) (Oral)  Resp 18  Ht 6\' 1"  (1.854 m)  Wt 179 lb (81.194 kg)  BMI 23.62 kg/m2  SpO2 97%  Physical Exam  Nursing note and vitals reviewed. Constitutional: He is oriented to person, place, and time. He appears well-developed and well-nourished. No distress.  HENT:  Head: Normocephalic and atraumatic.  Mouth/Throat: Oropharynx is clear and moist.  Neck: Normal range of motion. Neck supple.  Cardiovascular: Normal rate, regular rhythm, normal heart sounds and intact distal pulses.   No murmur heard. Pulmonary/Chest: Effort normal and breath sounds normal. No respiratory distress.  Abdominal: Soft. Bowel sounds are normal. He exhibits no distension and no mass. There is no hepatosplenomegaly. There is generalized tenderness. There is no rigidity, no rebound, no guarding and no CVA tenderness.  Musculoskeletal: Normal range of motion. He exhibits no edema and no tenderness.  Lymphadenopathy:    He has no cervical adenopathy.  Neurological: He is alert and oriented to person, place, and time. He exhibits normal muscle  tone. Coordination normal.  Skin: Skin is warm and dry.    ED Course  Procedures (including critical care time)  Labs Reviewed  COMPREHENSIVE METABOLIC PANEL - Abnormal; Notable for the following:    Chloride 94 (*)    Glucose, Bld 114 (*)    Total Protein 8.5 (*)    All other components within normal limits  URINALYSIS, ROUTINE W REFLEX MICROSCOPIC - Abnormal; Notable for the following:    Hgb urine dipstick MODERATE (*)    Bilirubin Urine SMALL (*)    Ketones, ur 15 (*)    Protein, ur 30 (*)    All other components within normal limits  LIPASE, BLOOD - Abnormal; Notable for the following:    Lipase 1492 (*)    All other components within normal limits  URINE MICROSCOPIC-ADD ON - Abnormal; Notable for the following:    Bacteria, UA FEW (*)    All other components within normal limits  CBC  DIFFERENTIAL     Today's labs have been evaluated by me. Lipase is elevated which is consistent with pancreatitis   MDM    Patient is feeling better. Vomiting has resolved. He still has diffuse abdominal tenderness on exam without  guarding or rebound tenderness.  He remains hypertensive but denies headache, dizziness, or focal neuro deficits. He also denies chest pain.  Patient has an elevated lipase today history of pancreatitis in the past. I have reviewed patient's previous ED chart and imaging. Patient had CT of abdomen and ultrasound of his abdomen in July of 2012 showing acute pancreatitis at that time. He has not had any GI followup since his previous visit. I will consult [facility] for admission.  I have also discussed the patient's history lab results and care plan with the EDP.     Consult to Dr. Karilyn Cota, he will admit the patient.     Jentri Aye L. Alyaan Budzynski, [place] 03/20/12 1250

## 2012-03-21 LAB — CBC
MCHC: 34.9 g/dL (ref 30.0–36.0)
Platelets: 187 10*3/uL (ref 150–400)
RDW: 13.7 % (ref 11.5–15.5)
WBC: 4.1 10*3/uL (ref 4.0–10.5)

## 2012-03-21 LAB — COMPREHENSIVE METABOLIC PANEL
ALT: 12 U/L (ref 0–53)
Alkaline Phosphatase: 55 U/L (ref 39–117)
BUN: 7 mg/dL (ref 6–23)
CO2: 26 mEq/L (ref 19–32)
GFR calc Af Amer: >90 mL/min (ref >90)
GFR calc non Af Amer: >90 mL/min (ref >90)
Glucose, Bld: 121 mg/dL — ABNORMAL HIGH (ref 70–99)
Potassium: 4 mEq/L (ref 3.5–5.1)
Sodium: 134 mEq/L — ABNORMAL LOW (ref 135–145)
Total Bilirubin: 1 mg/dL (ref 0.3–1.2)

## 2012-03-21 MED ORDER — AMLODIPINE BESYLATE 5 MG PO TABS
5.0000 mg | ORAL_TABLET | Freq: Every day | ORAL | Status: DC
  Administered 2012-03-21: 5 mg via ORAL
  Filled 2012-03-21: qty 1

## 2012-03-21 MED ORDER — SODIUM CHLORIDE 0.9 % IJ SOLN
INTRAMUSCULAR | Status: AC
  Filled 2012-03-21: qty 3

## 2012-03-21 NOTE — Progress Notes (Signed)
Subjective: This man is feeling somewhat better, he has tolerated a clear fluid diet. There has been no nausea or vomiting. His blood pressure is still somewhat volatile but less so. He appears not to have any withdrawal symptoms from alcohol.           Physical Exam: Blood pressure 162/99, pulse 78, temperature 98.6 F (37 C), temperature source Oral, resp. rate 20, height 6\' 1"  (1.854 m), weight 81.194 kg (179 lb), SpO2 93.00%. He looks systemically well. Abdomen is soft and nontender. Bowel sounds are heard. Lung fields are clear. He is alert and orientated.   Investigations:     Basic Metabolic Panel:  Basename 03/21/12 0500 03/20/12 0939  NA 134* 135  K 4.0 4.0  CL 100 94*  CO2 26 28  GLUCOSE 121* 114*  BUN 7 7  CREATININE 0.85 0.87  CALCIUM 9.0 9.9  MG -- --  PHOS -- --   Liver Function Tests:  Basename 03/21/12 0500 03/20/12 0939  AST 12 22  ALT 12 21  ALKPHOS 55 75  BILITOT 1.0 1.0  PROT 6.4 8.5*  ALBUMIN 3.2* 4.4     CBC:  Basename 03/21/12 0500 03/20/12 0939  WBC 4.1 5.0  NEUTROABS -- 3.4  HGB 13.6 16.3  HCT 39.0 47.3  MCV 97.0 97.3  PLT 187 222        Medications: I have reviewed the patient's current medications.  Impression: 1. Acute alcoholic pancreatitis. 2. Cocaine abuse. 3. Uncontrolled hypertension. 4. Alcohol and tobacco abuse.     Plan: 1. Advanced diet. 2. Add amlodipine 5 mg daily for control of his hypertension. 3. If he does well, possible discharge home tomorrow.     LOS: 1 day   Wilson Singer Pager [phone]  03/21/2012, 9:44 AM

## 2012-03-21 NOTE — ED Provider Notes (Signed)
Medical screening examination/treatment/procedure(s) were performed by non-physician practitioner and as supervising physician I was immediately available for consultation/collaboration.  Nicoletta Dress. Colon Branch, MD 03/21/12 1806

## 2012-03-22 LAB — CBC
MCHC: 34 g/dL (ref 30.0–36.0)
Platelets: 188 10*3/uL (ref 150–400)
RDW: 13.6 % (ref 11.5–15.5)
WBC: 3.5 10*3/uL — ABNORMAL LOW (ref 4.0–10.5)

## 2012-03-22 LAB — COMPREHENSIVE METABOLIC PANEL
Albumin: 3.1 g/dL — ABNORMAL LOW (ref 3.5–5.2)
Alkaline Phosphatase: 49 U/L (ref 39–117)
BUN: 4 mg/dL — ABNORMAL LOW (ref 6–23)
Calcium: 9.1 mg/dL (ref 8.4–10.5)
GFR calc Af Amer: >90 mL/min (ref >90)
Potassium: 3.9 mEq/L (ref 3.5–5.1)
Sodium: 135 mEq/L (ref 135–145)
Total Protein: 6.5 g/dL (ref 6.0–8.3)

## 2012-03-22 MED ORDER — CLONIDINE HCL 0.3 MG PO TABS: 0.3000 mg | ORAL_TABLET | Freq: Two times a day (BID) | ORAL | Status: DC

## 2012-03-22 MED ORDER — AMLODIPINE BESYLATE 5 MG PO TABS
10.0000 mg | ORAL_TABLET | Freq: Every day | ORAL | Status: DC
  Administered 2012-03-22: 10 mg via ORAL
  Filled 2012-03-22: qty 2

## 2012-03-22 MED ORDER — HYDROCODONE-ACETAMINOPHEN 5-500 MG PO TABS: 1.0000 | ORAL_TABLET | Freq: Four times a day (QID) | ORAL | Status: AC | PRN

## 2012-03-22 MED ORDER — SODIUM CHLORIDE 0.9 % IJ SOLN
INTRAMUSCULAR | Status: AC
  Filled 2012-03-22: qty 3

## 2012-03-22 MED ORDER — AMLODIPINE BESYLATE 10 MG PO TABS: 10.0000 mg | ORAL_TABLET | Freq: Every day | ORAL | Status: DC

## 2012-03-22 NOTE — Discharge Instructions (Signed)
Acute Pancreatitis The pancreas is a large gland located behind your stomach. It produces (secretes) enzymes. These enzymes help digest food. It also releases the hormones glucagon and insulin. These hormones help regulate blood sugar. When the pancreas becomes inflamed, the disease is called pancreatitis. Inflammation of the pancreas occurs when enzymes from the pancreas begin attacking and digesting the pancreas. CAUSES  Most cases ofsudden onset (acute) pancreatitis are caused by:  Alcohol abuse.   Gallstones.  Other less common causes are:  Some medications.   Exposure to certain chemicals   Infection.   Damage caused by an accident (trauma).   Surgery of the belly (abdomen).  SYMPTOMS  Acute pancreatitis usually begins with pain in the upper abdomen and may radiate to the back. This pain may last a couple days. The constant pain varies from mild to severe. The acute form of this disease may vary from mild, nonspecific abdominal pain to profound shock with coma. About 1 in 5 cases are severe. These patients become dehydrated and develop low blood pressure. In severe cases, bleeding into the pancreas can lead to shock and death. The lungs, heart, and kidneys may fail. DIAGNOSIS  Your caregiver will form a clinical opinion after giving you an exam. Laboratory work is used to confirm this diagnosis. Often,a digestive enzyme from the pancreas (serum amylase) and other enzymes are elevated. Sugars and fats (lipids) in the blood may be elevated. There may also be changes in the following levels: calcium, magnesium, potassium, chloride and bicarbonate (chemicals in the blood). X-rays, a CT scan, or ultrasound of your abdomen may be necessary to search for other causes of your abdominal pain. TREATMENT  Most pancreatitis requires treatment of symptoms. Most acute attacks last a couple of days. Your caregiver can discuss the treatment options with you.  If complications occur, hospitalization  may be necessary for pain control and intravenous (IV) fluid replacement.   Sometimes, a tube may be put into the stomach to control vomiting.   Food may not be allowed for 3 to 4 days. This gives the pancreas time to rest. Giving the pancreas a rest means there is no stimulation that would produce more enzymes and cause more damage.   Medicines (antibiotics) that kill germs may be given if infection is the cause.   Sometimes, surgery may be required.   Following an acute attack, your caregiver will determine the cause, if possible, and offer suggestions to prevent recurrences.  HOME CARE INSTRUCTIONS   Eat smaller, more frequent meals. This reduces the amount of digestive juices the pancreas produces.   Decrease the amount of fat in your diet. This may help reduce loose, diarrheal stools.   Drink enough water and fluids to keep your urine clear or pale yellow. This is to avoid dehydration which can cause increased pain.   Talk to your caregiver about pain relievers or other medicines that may help.   Avoid anything that may have triggered your pancreatitis (for example, alcohol).   Follow the diet advised by your caregiver. Do not advance the diet too soon.   Take medicines as prescribed.   Get plenty of rest.   Check your blood sugar at home as directed by your caregiver.   If your caregiver has given you a follow-up appointment, it is very important to keep that appointment. Not keeping the appointment could result in a lasting (chronic) or permanent injury, pain, and disability. If there is any problem keeping the appointment, you must call to reschedule.    SEEK MEDICAL CARE IF:   You are not recovering in the time described by your caregiver.   You have persistent pain, weakness, or feel sick to your stomach (nauseous).   You have recovered and then have another bout of pain.  SEEK IMMEDIATE MEDICAL CARE IF:   You are unable to eat or keep fluids down.   Your pain  increases a lot or changes.   You have an oral temperature above 102 F (38.9 C), not controlled by medicine.   Your skin or the white part of your eyes look yellow (jaundice).   You develop vomiting.   You feel dizzy or faint.   Your blood sugar is high (over 300).  MAKE SURE YOU:   Understand these instructions.   Will watch your condition.   Will get help right away if you are not doing well or get worse.  Document Released: 12/16/2005 Document Revised: 12/05/2011 Document Reviewed: 07/29/2008 [employer] Patient Information 2012 [employer]. 

## 2012-03-22 NOTE — Progress Notes (Signed)
Dr. Orvan Falconer returned called and made aware of BP, no new orders at this time

## 2012-03-22 NOTE — Progress Notes (Signed)
Patient's BP 161/112 HR 62, paged to Dr. Orvan Falconer, waiting for new orders at this time

## 2012-03-22 NOTE — Progress Notes (Signed)
Waiting on return call from Dr. Orvan Falconer, will pass on to day shift nurse

## 2012-03-22 NOTE — Progress Notes (Signed)
Discharge Summary: a/o.vss. Up ad lib. Saline lock removed. Pt tolerated lowfat diet for lunch.  Discharge instructions given. Prescriptions given. Pt verbalized understanding of instructions. Left floor ambulatory with nursing staff.

## 2012-03-22 NOTE — Progress Notes (Signed)
Dr. Orvan Falconer returned call, new orders given to increase Norvasc to 10mg  po daily and give first dose now

## 2012-03-22 NOTE — Discharge Summary (Signed)
Physician Discharge Summary  Patient ID: Paul [name] MRN: [id number] DOB/AGE: [birth year] 45 y.o.  Admit date: 03/20/2012 Discharge date: 03/22/2012  Primary Care Physician:  No primary provider on file.   Discharge Diagnoses:    Active Problems:  Acute alcoholic pancreatitis  HTN (hypertension)  Cocaine abuse  Alcohol abuse  Tobacco abuse    Medication List  As of 03/22/2012 11:51 AM   STOP taking these medications         PRESCRIPTION MEDICATION         TAKE these medications         amLODipine 10 MG tablet   Commonly known as: NORVASC   Take 1 tablet (10 mg total) by mouth daily.      cloNIDine 0.3 MG tablet   Commonly known as: CATAPRES   Take 1 tablet (0.3 mg total) by mouth 2 (two) times daily.      HYDROcodone-acetaminophen 5-500 MG per tablet   Commonly known as: VICODIN   Take 1 tablet by mouth every 6 (six) hours as needed for pain.           Discharge Exam: Blood pressure 144/92, pulse 76, temperature 98.4 F (36.9 C), temperature source Oral, resp. rate 22, height 6\' 1"  (1.854 m), weight 81.194 kg (179 lb), SpO2 98.00%. NAD CTA B S1, S2 RRR Soft, NT, BS+ No edema b/l  Disposition and Follow-up:  [facility]  He will follow with them in the next 1-2 weeks  Consults:  none   Significant Diagnostic Studies:  No results found.  Brief H and P: For complete details please refer to admission H and P, but in brief This 46 year old man, who has a previous history of alcoholic pancreatitis presents with the above symptoms which started yesterday evening. He admits to drinking 3 beers yesterday and also taking cocaine. He was hospitalized with similar presentation in July of 2012. He also has hypertension and he says he restarted taking medications only this week.    Hospital Course:  Patient was admitted to the hospital with acute pancreatitis.  He was placed on supportive treatment and his symptoms have significantly  improved. He is tolerating a full liquid diet.  This will be advanced to solid food and if he tolerates that, then he will be discharged home later today.  Patient was extensively counseled on the importance of cessation of alcohol, tobacco and drug use.  He is unwilling to try any nicotine replacement therapies.  He says he has tried alcohol rehab before and this has not been helpful.  We have asked csw to see patient, to see if there are any resources in the community to help with his addictions.  Blood pressure is better controlled and will need outpatient follow up.  Avoid BB due to history of cocaine abuse.  Time spent on Discharge:  Signed: Talley Kreiser [facility] Pager: [phone] 03/22/2012, 11:51 AM

## 2012-03-22 NOTE — Progress Notes (Signed)
Patient's BP 155/100 HR 62, paged Dr. Orvan Falconer to make aware

## 2012-03-22 NOTE — Progress Notes (Signed)
Patient's BP 162/114 HR 66, scheduled Clonidine 0.3mg  po given

## 2012-03-25 NOTE — Progress Notes (Signed)
Utilization review completed.  

## 2012-10-03 ENCOUNTER — Encounter (HOSPITAL_COMMUNITY): Payer: Self-pay

## 2012-10-03 ENCOUNTER — Inpatient Hospital Stay (HOSPITAL_COMMUNITY)
Admission: EM | Admit: 2012-10-03 | Discharge: 2012-10-05 | DRG: 440 | Disposition: A | Discharge status: Home / Self Care | Payer: Self-pay | Attending: Internal Medicine | Admitting: Internal Medicine

## 2012-10-03 DIAGNOSIS — Z23 Encounter for immunization: Secondary | ICD-10-CM

## 2012-10-03 DIAGNOSIS — E785 Hyperlipidemia, unspecified: Secondary | ICD-10-CM | POA: Diagnosis present

## 2012-10-03 DIAGNOSIS — K920 Hematemesis: Secondary | ICD-10-CM

## 2012-10-03 DIAGNOSIS — Z9119 Patient's noncompliance with other medical treatment and regimen: Secondary | ICD-10-CM

## 2012-10-03 DIAGNOSIS — Z720 Tobacco use: Secondary | ICD-10-CM

## 2012-10-03 DIAGNOSIS — I10 Essential (primary) hypertension: Secondary | ICD-10-CM

## 2012-10-03 DIAGNOSIS — K859 Acute pancreatitis without necrosis or infection, unspecified: Principal | ICD-10-CM

## 2012-10-03 DIAGNOSIS — F1010 Alcohol abuse, uncomplicated: Secondary | ICD-10-CM

## 2012-10-03 DIAGNOSIS — F17200 Nicotine dependence, unspecified, uncomplicated: Secondary | ICD-10-CM | POA: Diagnosis present

## 2012-10-03 DIAGNOSIS — F1410 Cocaine abuse, uncomplicated: Secondary | ICD-10-CM

## 2012-10-03 DIAGNOSIS — K852 Alcohol induced acute pancreatitis without necrosis or infection: Secondary | ICD-10-CM

## 2012-10-03 DIAGNOSIS — Z91199 Patient's noncompliance with other medical treatment and regimen due to unspecified reason: Secondary | ICD-10-CM

## 2012-10-03 LAB — CBC
Hemoglobin: 16 g/dL (ref 13.0–17.0)
MCH: 34.5 pg — ABNORMAL HIGH (ref 26.0–34.0)
Platelets: 226 10*3/uL (ref 150–400)
RBC: 4.64 MIL/uL (ref 4.22–5.81)
WBC: 6 10*3/uL (ref 4.0–10.5)

## 2012-10-03 LAB — COMPREHENSIVE METABOLIC PANEL
AST: 45 U/L — ABNORMAL HIGH (ref 0–37)
BUN: 7 mg/dL (ref 6–23)
CO2: 26 mEq/L (ref 19–32)
Calcium: 9.9 mg/dL (ref 8.4–10.5)
Chloride: 99 mEq/L (ref 96–112)
Creatinine, Ser: 0.7 mg/dL (ref 0.50–1.35)
GFR calc Af Amer: >90 mL/min (ref >90)
GFR calc non Af Amer: >90 mL/min (ref >90)
Glucose, Bld: 127 mg/dL — ABNORMAL HIGH (ref 70–99)
Total Bilirubin: 0.8 mg/dL (ref 0.3–1.2)

## 2012-10-03 LAB — TROPONIN I: Troponin I: <0.3 ng/mL (ref <0.30)

## 2012-10-03 LAB — LIPASE, BLOOD: Lipase: 559 U/L — ABNORMAL HIGH (ref 11–59)

## 2012-10-03 LAB — RAPID URINE DRUG SCREEN, HOSP PERFORMED
Amphetamines: NOT DETECTED
Benzodiazepines: NOT DETECTED
Cocaine: POSITIVE — AB

## 2012-10-03 LAB — MRSA PCR SCREENING: MRSA by PCR: NEGATIVE

## 2012-10-03 LAB — ETHANOL: Alcohol, Ethyl (B): 68 mg/dL — ABNORMAL HIGH (ref 0–11)

## 2012-10-03 MED ORDER — INFLUENZA VIRUS VACC SPLIT PF IM SUSP
0.2500 mL | INTRAMUSCULAR | Status: DC
  Filled 2012-10-03: qty 0.25

## 2012-10-03 MED ORDER — ENALAPRILAT 1.25 MG/ML IV SOLN
1.2500 mg | INTRAVENOUS | Status: DC
  Administered 2012-10-03 – 2012-10-04 (×5): 1.25 mg via INTRAVENOUS
  Filled 2012-10-03 (×2): qty 2
  Filled 2012-10-03: qty 6

## 2012-10-03 MED ORDER — HYDROMORPHONE HCL PF 1 MG/ML IJ SOLN
1.0000 mg | Freq: Once | INTRAMUSCULAR | Status: AC
  Administered 2012-10-03: 1 mg via INTRAVENOUS
  Filled 2012-10-03: qty 1

## 2012-10-03 MED ORDER — THIAMINE HCL 100 MG/ML IJ SOLN
Freq: Once | INTRAVENOUS | Status: AC
  Administered 2012-10-03: 18:00:00 via INTRAVENOUS
  Filled 2012-10-03: qty 1000

## 2012-10-03 MED ORDER — POTASSIUM CHLORIDE IN NACL 20-0.9 MEQ/L-% IV SOLN
INTRAVENOUS | Status: DC
  Administered 2012-10-03 – 2012-10-04 (×4): via INTRAVENOUS

## 2012-10-03 MED ORDER — HEPARIN SODIUM (PORCINE) 5000 UNIT/ML IJ SOLN
5000.0000 [IU] | Freq: Three times a day (TID) | INTRAMUSCULAR | Status: DC
  Administered 2012-10-03 – 2012-10-04 (×5): 5000 [IU] via SUBCUTANEOUS
  Filled 2012-10-03: qty 1
  Filled 2012-10-03: qty 2
  Filled 2012-10-03 (×9): qty 1

## 2012-10-03 MED ORDER — PANTOPRAZOLE SODIUM 40 MG IV SOLR
40.0000 mg | Freq: Once | INTRAVENOUS | Status: AC
  Administered 2012-10-03: 40 mg via INTRAVENOUS
  Filled 2012-10-03: qty 40

## 2012-10-03 MED ORDER — M.V.I. ADULT IV INJ
INJECTION | INTRAVENOUS | Status: AC
  Filled 2012-10-03: qty 10

## 2012-10-03 MED ORDER — ENALAPRILAT 1.25 MG/ML IV SOLN: 1.2500 mg | Freq: Four times a day (QID) | INTRAVENOUS | Status: DC

## 2012-10-03 MED ORDER — FOLIC ACID 5 MG/ML IJ SOLN
INTRAMUSCULAR | Status: AC
  Filled 2012-10-03: qty 0.2

## 2012-10-03 MED ORDER — ONDANSETRON HCL 4 MG/2ML IJ SOLN
4.0000 mg | Freq: Four times a day (QID) | INTRAMUSCULAR | Status: DC | PRN
  Administered 2012-10-03 – 2012-10-05 (×3): 4 mg via INTRAVENOUS
  Filled 2012-10-03 (×3): qty 2

## 2012-10-03 MED ORDER — HYDROMORPHONE HCL PF 1 MG/ML IJ SOLN
1.0000 mg | INTRAMUSCULAR | Status: DC | PRN
  Administered 2012-10-03: 1 mg via INTRAVENOUS
  Administered 2012-10-04: 2 mg via INTRAVENOUS
  Administered 2012-10-04 (×2): 1 mg via INTRAVENOUS
  Administered 2012-10-04 – 2012-10-05 (×5): 2 mg via INTRAVENOUS
  Filled 2012-10-03: qty 1
  Filled 2012-10-03 (×3): qty 2
  Filled 2012-10-03 (×2): qty 1
  Filled 2012-10-03 (×3): qty 2

## 2012-10-03 MED ORDER — CLONIDINE HCL 0.2 MG PO TABS
0.3000 mg | ORAL_TABLET | Freq: Once | ORAL | Status: AC
  Administered 2012-10-03: 0.3 mg via ORAL
  Filled 2012-10-03: qty 1

## 2012-10-03 MED ORDER — HYDRALAZINE HCL 20 MG/ML IJ SOLN
10.0000 mg | INTRAMUSCULAR | Status: DC | PRN
  Administered 2012-10-03 – 2012-10-05 (×4): 10 mg via INTRAVENOUS
  Filled 2012-10-03 (×4): qty 1

## 2012-10-03 MED ORDER — SODIUM CHLORIDE 0.9 % IV BOLUS (SEPSIS)
1000.0000 mL | Freq: Once | INTRAVENOUS | Status: AC
  Administered 2012-10-03: 1000 mL via INTRAVENOUS

## 2012-10-03 MED ORDER — ONDANSETRON HCL 4 MG PO TABS
4.0000 mg | ORAL_TABLET | Freq: Four times a day (QID) | ORAL | Status: DC | PRN
  Administered 2012-10-04: 4 mg via ORAL
  Filled 2012-10-03: qty 1

## 2012-10-03 MED ORDER — MORPHINE SULFATE 2 MG/ML IJ SOLN
2.0000 mg | INTRAMUSCULAR | Status: DC | PRN
  Administered 2012-10-03: 2 mg via INTRAVENOUS
  Filled 2012-10-03 (×2): qty 1

## 2012-10-03 MED ORDER — SODIUM CHLORIDE 0.9 % IJ SOLN
3.0000 mL | Freq: Two times a day (BID) | INTRAMUSCULAR | Status: DC
  Administered 2012-10-03 – 2012-10-04 (×2): 3 mL via INTRAVENOUS
  Filled 2012-10-03 (×2): qty 3

## 2012-10-03 MED ORDER — ONDANSETRON HCL 4 MG/2ML IJ SOLN
4.0000 mg | Freq: Once | INTRAMUSCULAR | Status: AC
  Administered 2012-10-03: 4 mg via INTRAVENOUS
  Filled 2012-10-03: qty 2

## 2012-10-03 MED ORDER — PNEUMOCOCCAL VAC POLYVALENT 25 MCG/0.5ML IJ INJ
0.5000 mL | INJECTION | INTRAMUSCULAR | Status: DC
  Filled 2012-10-03: qty 0.5

## 2012-10-03 MED ORDER — THIAMINE HCL 100 MG/ML IJ SOLN
INTRAMUSCULAR | Status: AC
  Filled 2012-10-03: qty 2

## 2012-10-03 MED ORDER — LORAZEPAM 2 MG/ML IJ SOLN
1.0000 mg | Freq: Once | INTRAMUSCULAR | Status: AC
  Administered 2012-10-03: 1 mg via INTRAVENOUS
  Filled 2012-10-03: qty 1

## 2012-10-03 NOTE — ED Notes (Signed)
Pt reports he has not taken his bp medicine for at least a month.

## 2012-10-03 NOTE — ED Notes (Signed)
Pt reports vomiting since last night, thinks he may have eaten bad chicken, denies any diarrhea or fever.

## 2012-10-03 NOTE — ED Provider Notes (Addendum)
History  This chart was scribed for Paul Roots, MD by Erskine Emery. This patient was seen in room APA04/APA04 and the patient's care was started at 14:43.   CSN: [id number]  Arrival date & time 10/03/12  1334   First MD Initiated Contact with Patient 10/03/12 1443      Chief Complaint  Patient presents with  . Emesis    (Consider location/radiation/quality/duration/timing/severity/associated sxs/prior treatment) The history is provided by the patient. No language interpreter was used.  Paul [name] is a 46 y.o. male who presents to the Emergency Department complaining of constant abdominal pain that radiates to the back and emesis (6 episodes) since last night after eating. abd pain, constant, dull, occ radiates to back, worse w palpation. Pt denies any chest pain, SOB, diarrhea, fever, or h/o abdominal surgery or stomach ulcers. Pt has a h/o alcohol and drug abuse but is on no prescribed medications. Pt also has a h/o pancreatitis and HTN.  Emesis not bloody or bilious. Having normal bms. No abd distension or prior abd surgery. No hx pud. No chest pain. +cocaine and etoh abuse.     Past Medical History  Diagnosis Date  . Pancreatitis, acute     First episode earlier in 2012  . Hypertension     noncompliance  . Hyperlipidemia     noncompliance  . Lung collapse     h/o    Past Surgical History  Procedure Date  . Mandible fracture surgery 2006  . Left axillary     surgery for deep laceration    Family History  Problem Relation Age of Onset  . Diabetes type II Mother   . Liver disease Neg Hx   . Colon cancer Neg Hx   . GI problems Neg Hx     History  Substance Use Topics  . Smoking status: Current Every Day Smoker -- 1.0 packs/day    Types: Cigarettes  . Smokeless tobacco: Not on file  . Alcohol Use: Yes     Three 40oz per day      Review of Systems  Constitutional: Negative for fever and fatigue.  HENT: Negative for congestion, neck pain, neck  stiffness, sinus pressure and ear discharge.   Eyes: Negative for pain and visual disturbance.  Respiratory: Negative for cough and shortness of breath.   Cardiovascular: Negative for chest pain, palpitations and leg swelling.  Gastrointestinal: Positive for nausea, vomiting and abdominal pain. Negative for diarrhea and constipation.  Genitourinary: Negative for frequency and hematuria.  Musculoskeletal: Negative for myalgias.  Skin: Negative for rash.  Neurological: Negative for seizures and headaches.  Hematological: Negative.   Psychiatric/Behavioral: Negative for hallucinations.  All other systems reviewed and are negative.    Allergies  Review of patient's allergies indicates no known allergies.  Home Medications   Current Outpatient Rx  Name Route Sig Dispense Refill  . AMLODIPINE BESYLATE 10 MG PO TABS Oral Take 1 tablet (10 mg total) by mouth daily. 30 tablet 1  . CLONIDINE HCL 0.3 MG PO TABS Oral Take 1 tablet (0.3 mg total) by mouth 2 (two) times daily. 60 tablet 0    Triage Vitals: BP 217/109  Pulse 62  Temp 97.5 F (36.4 C) (Oral)  Resp 20  Ht 6' (1.829 m)  Wt 170 lb (77.111 kg)  BMI 23.06 kg/m2  SpO2 100%  Physical Exam  Nursing note and vitals reviewed. Constitutional: He is oriented to person, place, and time. He appears well-developed and well-nourished. No distress.  HENT:  Head: Normocephalic and atraumatic.  Mouth/Throat: Oropharynx is clear and moist.  Eyes: Conjunctivae normal and EOM are normal. Pupils are equal, round, and reactive to light.  Neck: Neck supple. No tracheal deviation present.  Cardiovascular: Normal rate, regular rhythm, normal heart sounds and intact distal pulses.  Exam reveals no gallop and no friction rub.   No murmur heard. Pulmonary/Chest: Effort normal and breath sounds normal. No respiratory distress.  Abdominal: Soft. Bowel sounds are normal. He exhibits no distension and no mass. There is tenderness. There is no rebound  and no guarding.       Epigastric tenderness, no rebound or guarding.   Genitourinary:       No cva tenderness  Musculoskeletal: Normal range of motion. He exhibits no edema and no tenderness.  Neurological: He is alert and oriented to person, place, and time.  Skin: Skin is warm and dry.  Psychiatric: He has a normal mood and affect.    ED Course  Procedures (including critical care time) DIAGNOSTIC STUDIES: Oxygen Saturation is 100% on room air, normal by my interpretation.    COORDINATION OF CARE: 14:45--I evaluated the patient and we discussed a treatment plan including IV fluids, pain medication, nausea medication, and blood work to which the pt agreed.   Results for orders placed during the hospital encounter of 10/03/12  ETHANOL      Component Value Range   Alcohol, Ethyl (B) 68 (*) 0 - 11 mg/dL  TROPONIN I      Component Value Range   Troponin I <0.30  <0.30 ng/mL  COMPREHENSIVE METABOLIC PANEL      Component Value Range   Sodium 140  135 - 145 mEq/L   Potassium 3.7  3.5 - 5.1 mEq/L   Chloride 99  96 - 112 mEq/L   CO2 26  19 - 32 mEq/L   Glucose, Bld 127 (*) 70 - 99 mg/dL   BUN 7  6 - 23 mg/dL   Creatinine, Ser 1.61  0.50 - 1.35 mg/dL   Calcium 9.9  8.4 - 09.6 mg/dL   Total Protein 8.4 (*) 6.0 - 8.3 g/dL   Albumin 4.4  3.5 - 5.2 g/dL   AST 45 (*) 0 - 37 U/L   ALT 35  0 - 53 U/L   Alkaline Phosphatase 73  39 - 117 U/L   Total Bilirubin 0.8  0.3 - 1.2 mg/dL   GFR calc non Af Amer >90  >90 mL/min   GFR calc Af Amer >90  >90 mL/min  CBC      Component Value Range   WBC 6.0  4.0 - 10.5 K/uL   RBC 4.64  4.22 - 5.81 MIL/uL   Hemoglobin 16.0  13.0 - 17.0 g/dL   HCT 04.5  40.9 - 81.1 %   MCV 100.2 (*) 78.0 - 100.0 fL   MCH 34.5 (*) 26.0 - 34.0 pg   MCHC 34.4  30.0 - 36.0 g/dL   RDW 91.4  78.2 - 95.6 %   Platelets 226  150 - 400 K/uL  LIPASE, BLOOD      Component Value Range   Lipase 559 (*) 11 - 59 U/L       MDM  Iv ns. protonix iv. Dilaudid iv. zofran  iv.  Recheck vitals.   Reviewed nursing notes and prior charts for additional history.   Hx alcohol related pancreatitis.   Date: 10/03/2012  Rate: 65  Rhythm: normal sinus rhythm  QRS Axis: normal  Intervals: normal  ST/T Wave abnormalities: normal  Conduction Disutrbances:lvh  Narrative Interpretation:   Old EKG Reviewed: unchanged   bp high, pt states hasnt taken his meds today, chart indicates on clonidine .3 and amlodipine - will give pt his normal meds.   Recheck pain persistent but improved.  Med service called to admit.   Paul Roots, MD 10/03/12 1604  Paul Roots, MD 10/03/12 [phone]

## 2012-10-03 NOTE — ED Notes (Signed)
Pt reports ongoing cocaine use, last use last night. Denies chest pain and shortness of breath. Appears uncomfortable.

## 2012-10-03 NOTE — H&P (Signed)
[facility] History and Physical  Paul [name] WUJ:[id number] DOB: February 06, 1966 DOA: 10/03/2012  Referring physician: Dr. Leo Rod, ER physician. PCP: No primary provider on file.    Chief Complaint: Abdominal pain, nausea vomiting.  HPI: Paul [name] is a 46 y.o. male who presents with the above symptoms the last 24 hours. He has a history of alcohol and cocaine abuse and he has been abusing both in the last 24 hours. He had a similar admission for pancreatitis in March 2013. Unfortunately he has not stopped abusing alcohol and cocaine.   Review of Systems: .  Apart from history of present illness other systems negative.  Past Medical History  Diagnosis Date  . Pancreatitis, acute     First episode earlier in 2012  . Hypertension     noncompliance  . Hyperlipidemia     noncompliance  . Lung collapse     h/o   Past Surgical History  Procedure Date  . Mandible fracture surgery 2006  . Left axillary     surgery for deep laceration   Social History:  reports that he has been smoking Cigarettes.  He has been smoking about 1 pack per day. He does not have any smokeless tobacco history on file. He reports that he drinks alcohol. He reports that he uses illicit drugs (Cocaine) about 3 times per week.   No Known Allergies  Family History  Problem Relation Age of Onset  . Diabetes type II Mother   . Liver disease Neg Hx   . Colon cancer Neg Hx   . GI problems Neg Hx     Prior to Admission medications   Not on File   Physical Exam: Filed Vitals:   10/03/12 1432 10/03/12 1441 10/03/12 1528 10/03/12 1557  BP: 217/109  212/132 207/133  Pulse: 62     Temp:      TempSrc:      Resp:   24 20  Height:      Weight:      SpO2:  100% 99% 97%     General:  He does not appear to be in significant pain.   Eyes: No jaundice. No pallor.  ENT: Within normal limits.  Neck: No lymphadenopathy.  Cardiovascular: Heart sounds are present and normal without  murmurs.  Respiratory: Lung fields are clear.  Abdomen: Soft, nonspecific tenderness in the generalized fashion, bowel sounds scanty.  Skin: No rashes.  Musculoskeletal: No joint problems.  Psychiatric: Noncommunicative.  Neurologic: Alert and orientated without any focal neurological signs.  Labs on Admission:  Basic Metabolic Panel:  Lab 10/03/12 9562  NA 140  K 3.7  CL 99  CO2 26  GLUCOSE 127*  BUN 7  CREATININE 0.70  CALCIUM 9.9  MG --  PHOS --   Liver Function Tests:  Lab 10/03/12 1504  AST 45*  ALT 35  ALKPHOS 73  BILITOT 0.8  PROT 8.4*  ALBUMIN 4.4    Lab 10/03/12 1504  LIPASE 559*  AMYLASE --    CBC:  Lab 10/03/12 1504  WBC 6.0  NEUTROABS --  HGB 16.0  HCT 46.5  MCV 100.2*  PLT 226   Cardiac Enzymes:  Lab 10/03/12 1504  CKTOTAL --  CKMB --  CKMBINDEX --  TROPONINI <0.30           Assessment/Plan Active Problems:  Acute alcoholic pancreatitis  HTN (hypertension)  Cocaine abuse  Alcohol abuse  Tobacco abuse   1. Acute alcoholic pancreatitis 2. Uncontrolled hypertension. 3. Cocaine  abuse. 4. Alcohol abuse. 5. Tobacco abuse. Plan: 1. Admit to step down unit in view of uncontrolled hypertension. 2. N.p.o., IV fluids. 3. Intravenous antihypertensive medications. 4. Antiemetics as needed. Further recommendations will depend on patient's hospital progress.  Code Status: Full code.  Family Communication: Plan discuss with patient at bedside.  Disposition Plan: Home in medically stable.    Time spent: 30 minutes.  Wilson Singer [facility] Pager [phone].  If 7PM-7AM, please contact night-coverage [email or web address] Password [facility] 10/03/2012, 4:37 PM

## 2012-10-04 LAB — COMPREHENSIVE METABOLIC PANEL
ALT: 26 U/L (ref 0–53)
Albumin: 3.7 g/dL (ref 3.5–5.2)
Alkaline Phosphatase: 59 U/L (ref 39–117)
BUN: 7 mg/dL (ref 6–23)
Calcium: 9.1 mg/dL (ref 8.4–10.5)
GFR calc Af Amer: >90 mL/min (ref >90)
Glucose, Bld: 104 mg/dL — ABNORMAL HIGH (ref 70–99)
Potassium: 3.6 mEq/L (ref 3.5–5.1)
Sodium: 133 mEq/L — ABNORMAL LOW (ref 135–145)
Total Protein: 7.1 g/dL (ref 6.0–8.3)

## 2012-10-04 LAB — PROTIME-INR: INR: 0.99 (ref 0.00–1.49)

## 2012-10-04 LAB — CBC
HCT: 44.3 % (ref 39.0–52.0)
Hemoglobin: 15.5 g/dL (ref 13.0–17.0)
MCV: 99.1 fL (ref 78.0–100.0)
RDW: 13.6 % (ref 11.5–15.5)
WBC: 6.4 10*3/uL (ref 4.0–10.5)

## 2012-10-04 MED ORDER — AMLODIPINE BESYLATE 5 MG PO TABS
10.0000 mg | ORAL_TABLET | Freq: Every day | ORAL | Status: DC
  Administered 2012-10-04 – 2012-10-05 (×2): 10 mg via ORAL
  Filled 2012-10-04 (×2): qty 2

## 2012-10-04 MED ORDER — AMLODIPINE BESYLATE 5 MG PO TABS
5.0000 mg | ORAL_TABLET | Freq: Every day | ORAL | Status: DC
  Administered 2012-10-04: 5 mg via ORAL
  Filled 2012-10-04: qty 1

## 2012-10-04 MED ORDER — LISINOPRIL 10 MG PO TABS
20.0000 mg | ORAL_TABLET | Freq: Every day | ORAL | Status: DC
  Administered 2012-10-04 – 2012-10-05 (×2): 20 mg via ORAL
  Filled 2012-10-04: qty 2
  Filled 2012-10-04 (×2): qty 1

## 2012-10-04 NOTE — Progress Notes (Signed)
     Subjective: This man presented to the hospital yesterday with alcohol-induced pancreatitis. He also has history of cocaine abuse and he is hypertensive. Today he feels better, the pain is improved.           Physical Exam: Blood pressure 186/113, pulse 101, temperature 97.9 F (36.6 C), temperature source Oral, resp. rate 18, height 6' (1.829 m), weight 75.9 kg (167 lb 5.3 oz), SpO2 97.00%. He remains hypertensive. He does not seem to be in pain. Abdomen is soft and less tender than yesterday. Heart sounds are present and normal. He is alert and orientated.   Investigations:  Recent Results (from the past 240 hour(s))  MRSA PCR SCREENING     Status: Normal   Collection Time   10/03/12  6:33 PM      Component Value Range Status Comment   MRSA by PCR NEGATIVE  NEGATIVE Final      Basic Metabolic Panel:  Basename 10/04/12 0519 10/03/12 1504  NA 133* 140  K 3.6 3.7  CL 96 99  CO2 25 26  GLUCOSE 104* 127*  BUN 7 7  CREATININE 0.59 0.70  CALCIUM 9.1 9.9  MG -- --  PHOS -- --   Liver Function Tests:  [facility] 10/04/12 0519 10/03/12 1504  AST 27 45*  ALT 26 35  ALKPHOS 59 73  BILITOT 1.3* 0.8  PROT 7.1 8.4*  ALBUMIN 3.7 4.4     CBC:  Basename 10/04/12 0519 10/03/12 1504  WBC 6.4 6.0  NEUTROABS -- --  HGB 15.5 16.0  HCT 44.3 46.5  MCV 99.1 100.2*  PLT 211 226        Medications: I have reviewed the patient's current medications.  Impression: 1. Acute alcoholic pancreatitis, clinically improving. 2. Uncontrolled hypertension. 3. Cocaine abuse. 4. Alcohol abuse. 5. Tobacco abuse.     Plan: 1. Advance diet as tolerated. 2. Start oral antihypertensive medications including lisinopril and Norvasc. Avoid beta blockers for the time being in view of cocaine abuse.     LOS: 1 day   Wilson Singer Pager [phone]  10/04/2012, 8:15 AM

## 2012-10-04 NOTE — Progress Notes (Signed)
Reviewing information with patient before giving flu and pneumonia vaccine.  Patient verbalized not authorizing vaccinations and did not want the vaccinations.  Explained benefits of taking vaccinations and ask patient to review information sheets of the flu and pneumonia vaccinations.  Pt refusing vaccinations at this time.

## 2012-10-05 MED ORDER — AMLODIPINE BESYLATE 10 MG PO TABS: 10.0000 mg | ORAL_TABLET | Freq: Every day | ORAL | Status: DC

## 2012-10-05 MED ORDER — LISINOPRIL 20 MG PO TABS: 20.0000 mg | ORAL_TABLET | Freq: Every day | ORAL | Status: DC

## 2012-10-05 NOTE — Progress Notes (Addendum)
Pt being d/c'd home after am meds given. Pt alert and oriented. bp wnl. No c/o pain. Pt d/c'd iv per self. Self bath and am care given. Discharge instructions given w/ exit care notes bon alcohol/withdrawal,cocaine, pancreatitis, and hypertension. Case manager in to discuss pt's d/c plan for medical after d/c.  Pt discharged home escorted by RN.

## 2012-10-05 NOTE — Discharge Summary (Signed)
Physician Discharge Summary  Paul [name] ZOX:[id number] DOB: [birth year] DOA: 10/03/2012    Admit date: 10/03/2012 Discharge date: 10/05/2012  Recommendations for Outpatient Follow-up:  1. Follow up primary care physician.   Discharge Diagnoses:  1. Acute alcoholic pancreatitis, resolved. 2. Uncontrolled hypertension, improved on medications. 3. Cocaine abuse, urine drug screen positive for cocaine on this admission. 4. Alcohol abuse. 5. Tobacco abuse.   Discharge Condition: Stable and improved.  Diet recommendation: Regular. No alcohol, no cocaine.  Filed Weights   10/03/12 1401 10/03/12 1746 10/04/12 0328  Weight: 77.111 kg (170 lb) 75.5 kg (166 lb 7.2 oz) 75.9 kg (167 lb 5.3 oz)    History of present illness:  This 46 year old man was admitted to the hospital with symptoms of abdominal pain, nausea and vomiting. Please see initial history outlined below:  Paul [name] is a 46 y.o. male who presents with the above symptoms the last 24 hours. He has a history of alcohol and cocaine abuse and he has been abusing both in the last 24 hours. He had a similar admission for pancreatitis in March 2013. Unfortunately he has not stopped abusing alcohol and cocaine.  Hospital Course:  Patient was admitted to the step down unit in view of uncontrolled hypertension. He was initially kept n.p.o. and started on IV fluids for his pancreatitis. He was given analgesia as required intravenously. He made a good and steady improvement. His diet was advanced and he was able to tolerate this. His blood pressure has come under good control with oral medications once he was able to take oral intake. He was counseled extensively regarding alcohol and cocaine abuse. He was also counseled regarding taking his antihypertensive medications, which he has been noncompliant with.  Procedures:  None.   Consultations:  None.  Discharge Exam: Filed Vitals:   10/05/12 0200 10/05/12 0300 10/05/12 0400  10/05/12 0600  BP: 136/70 160/103 159/99 138/82  Pulse:  42    Temp:   98.8 F (37.1 C)   TempSrc:   Oral   Resp:    16  Height:      Weight:      SpO2:  83%      General: Looks systemically well. Cardiovascular: Heart sounds are present and normal. Respiratory: Lung fields are clear. Abdomen is soft and nontender now. He is alert and orientated. There are no signs of alcohol withdrawal  Discharge Instructions  Discharge Orders    Future Orders Please Complete By Expires   Diet - low sodium heart healthy      Increase activity slowly          Medication List     As of 10/05/2012  7:51 AM    TAKE these medications         amLODipine 10 MG tablet   Commonly known as: NORVASC   Take 1 tablet (10 mg total) by mouth daily.      lisinopril 20 MG tablet   Commonly known as: PRINIVIL,ZESTRIL   Take 1 tablet (20 mg total) by mouth daily.          The results of significant diagnostics from this hospitalization (including imaging, microbiology, ancillary and laboratory) are listed below for reference.    Significant Diagnostic Studies: No results found.  Microbiology: Recent Results (from the past 240 hour(s))  MRSA PCR SCREENING     Status: Normal   Collection Time   10/03/12  6:33 PM      Component Value Range Status Comment  MRSA by PCR NEGATIVE  NEGATIVE Final      Labs: Basic Metabolic Panel:  Lab 10/04/12 1610 10/03/12 1504  NA 133* 140  K 3.6 3.7  CL 96 99  CO2 25 26  GLUCOSE 104* 127*  BUN 7 7  CREATININE 0.59 0.70  CALCIUM 9.1 9.9  MG -- --  PHOS -- --   Liver Function Tests:  Lab 10/04/12 0519 10/03/12 1504  AST 27 45*  ALT 26 35  ALKPHOS 59 73  BILITOT 1.3* 0.8  PROT 7.1 8.4*  ALBUMIN 3.7 4.4    Lab 10/03/12 1504  LIPASE 559*  AMYLASE --    CBC:  Lab 10/04/12 0519 10/03/12 1504  WBC 6.4 6.0  NEUTROABS -- --  HGB 15.5 16.0  HCT 44.3 46.5  MCV 99.1 100.2*  PLT 211 226   Cardiac Enzymes:  Lab 10/03/12 1504  CKTOTAL --   CKMB --  CKMBINDEX --  TROPONINI <0.30     Time coordinating discharge: *Less than 30 minutes  Signed:  Quinteria Chisum C  [facility] 10/05/2012, 7:51 AM

## 2012-10-05 NOTE — Clinical Social Work Note (Signed)
CSW attempted to complete consult ordered due to substance abuse.  Patient discharged before consult could be completed.  CSW signing off.  Santa Genera, LCSW Clinical Social Worker [phone])

## 2012-10-05 NOTE — Care Management Note (Signed)
    Page 1 of 1   10/05/2012     3:24:11 PM   CARE MANAGEMENT NOTE 10/05/2012  Patient:  Paul [name], Paul [name]   Account Number:  [id number]  Date Initiated:  10/05/2012  Documentation initiated by:  Rosemary Holms  Subjective/Objective Assessment:   Pt admitted with pancreatitis. CM asked to see pt for assistance with finding [name] PCP     Action/Plan:   Spoke to pt. Does not work nor does he have transportation. Pt advised his best option is [facility]. [facility] requires someone in the household to work and unless he is willing to travel CM is...   Anticipated DC Date:  10/05/2012   Anticipated DC Plan:  HOME/SELF CARE      DC Planning Services  CM consult      Choice offered to / List presented to:             Status of service:  Completed, signed off Medicare Important Message given?   (If response is "NO", the following Medicare IM given date fields will be blank) Date Medicare IM given:   Date Additional Medicare IM given:    Discharge Disposition:  HOME/SELF CARE  Per UR Regulation:    If discussed at Long Length of Stay Meetings, dates discussed:    Comments:  10/05/12 Rosemary Holms RN BSN CM unaware of anyone talking new self pay pts.

## 2012-10-06 ENCOUNTER — Emergency Department (HOSPITAL_COMMUNITY): Payer: Self-pay

## 2012-10-06 ENCOUNTER — Encounter (HOSPITAL_COMMUNITY): Payer: Self-pay | Admitting: *Deleted

## 2012-10-06 ENCOUNTER — Emergency Department (HOSPITAL_COMMUNITY)
Admission: EM | Admit: 2012-10-06 | Discharge: 2012-10-06 | Disposition: A | Discharge status: Home / Self Care | Payer: Self-pay | Attending: Emergency Medicine | Admitting: Emergency Medicine

## 2012-10-06 DIAGNOSIS — R109 Unspecified abdominal pain: Secondary | ICD-10-CM | POA: Insufficient documentation

## 2012-10-06 DIAGNOSIS — N200 Calculus of kidney: Secondary | ICD-10-CM | POA: Insufficient documentation

## 2012-10-06 DIAGNOSIS — R10819 Abdominal tenderness, unspecified site: Secondary | ICD-10-CM | POA: Insufficient documentation

## 2012-10-06 DIAGNOSIS — F17200 Nicotine dependence, unspecified, uncomplicated: Secondary | ICD-10-CM | POA: Insufficient documentation

## 2012-10-06 DIAGNOSIS — I10 Essential (primary) hypertension: Secondary | ICD-10-CM

## 2012-10-06 DIAGNOSIS — K859 Acute pancreatitis without necrosis or infection, unspecified: Secondary | ICD-10-CM

## 2012-10-06 LAB — CBC WITH DIFFERENTIAL/PLATELET
Basophils Relative: 0 % (ref 0–1)
Eosinophils Absolute: 0 10*3/uL (ref 0.0–0.7)
Hemoglobin: 15.2 g/dL (ref 13.0–17.0)
Lymphs Abs: 1.6 10*3/uL (ref 0.7–4.0)
MCH: 34.5 pg — ABNORMAL HIGH (ref 26.0–34.0)
MCHC: 34.5 g/dL (ref 30.0–36.0)
Monocytes Relative: 9 % (ref 3–12)
Neutro Abs: 5.5 10*3/uL (ref 1.7–7.7)
Neutrophils Relative %: 70 % (ref 43–77)
Platelets: 186 10*3/uL (ref 150–400)
RBC: 4.4 MIL/uL (ref 4.22–5.81)

## 2012-10-06 LAB — LACTIC ACID, PLASMA: Lactic Acid, Venous: 1.3 mmol/L (ref 0.5–2.2)

## 2012-10-06 LAB — COMPREHENSIVE METABOLIC PANEL
AST: 34 U/L (ref 0–37)
CO2: 29 mEq/L (ref 19–32)
Calcium: 10.5 mg/dL (ref 8.4–10.5)
Chloride: 92 mEq/L — ABNORMAL LOW (ref 96–112)
Creatinine, Ser: 0.66 mg/dL (ref 0.50–1.35)
GFR calc Af Amer: >90 mL/min (ref >90)
GFR calc non Af Amer: >90 mL/min (ref >90)
Glucose, Bld: 94 mg/dL (ref 70–99)
Total Bilirubin: 1.1 mg/dL (ref 0.3–1.2)

## 2012-10-06 LAB — LIPASE, BLOOD: Lipase: 407 U/L — ABNORMAL HIGH (ref 11–59)

## 2012-10-06 LAB — URINALYSIS, ROUTINE W REFLEX MICROSCOPIC
Nitrite: NEGATIVE
Protein, ur: 30 mg/dL — AB
Urobilinogen, UA: 0.2 mg/dL (ref 0.0–1.0)

## 2012-10-06 LAB — AMYLASE: Amylase: 219 U/L — ABNORMAL HIGH (ref 0–105)

## 2012-10-06 LAB — URINE MICROSCOPIC-ADD ON

## 2012-10-06 MED ORDER — OXYCODONE-ACETAMINOPHEN 5-325 MG PO TABS: 2.0000 | ORAL_TABLET | ORAL | Status: DC | PRN

## 2012-10-06 MED ORDER — LISINOPRIL 20 MG PO TABS
20.0000 mg | ORAL_TABLET | Freq: Once | ORAL | Status: AC
  Administered 2012-10-06: 20 mg via ORAL
  Filled 2012-10-06: qty 1

## 2012-10-06 MED ORDER — LABETALOL HCL 5 MG/ML IV SOLN
10.0000 mg | Freq: Once | INTRAVENOUS | Status: AC
  Administered 2012-10-06: 10 mg via INTRAVENOUS
  Filled 2012-10-06: qty 4

## 2012-10-06 MED ORDER — ONDANSETRON HCL 4 MG/2ML IJ SOLN
4.0000 mg | Freq: Once | INTRAMUSCULAR | Status: AC
  Administered 2012-10-06: 4 mg via INTRAVENOUS
  Filled 2012-10-06: qty 2

## 2012-10-06 MED ORDER — SODIUM CHLORIDE 0.9 % IV BOLUS (SEPSIS)
1000.0000 mL | Freq: Once | INTRAVENOUS | Status: AC
  Administered 2012-10-06: 1000 mL via INTRAVENOUS

## 2012-10-06 MED ORDER — HYDRALAZINE HCL 20 MG/ML IJ SOLN
10.0000 mg | Freq: Once | INTRAMUSCULAR | Status: AC
  Administered 2012-10-06: 10 mg via INTRAVENOUS
  Filled 2012-10-06: qty 1

## 2012-10-06 MED ORDER — AMLODIPINE BESYLATE 5 MG PO TABS
10.0000 mg | ORAL_TABLET | Freq: Once | ORAL | Status: AC
  Administered 2012-10-06: 10 mg via ORAL
  Filled 2012-10-06: qty 2

## 2012-10-06 MED ORDER — LISINOPRIL 10 MG PO TABS
ORAL_TABLET | ORAL | Status: AC
  Filled 2012-10-06: qty 2

## 2012-10-06 MED ORDER — OXYCODONE-ACETAMINOPHEN 5-325 MG PO TABS
2.0000 | ORAL_TABLET | Freq: Once | ORAL | Status: AC
  Administered 2012-10-06: 2 via ORAL
  Filled 2012-10-06: qty 2

## 2012-10-06 MED ORDER — PANTOPRAZOLE SODIUM 40 MG IV SOLR
40.0000 mg | Freq: Once | INTRAVENOUS | Status: AC
  Administered 2012-10-06: 40 mg via INTRAVENOUS
  Filled 2012-10-06: qty 40

## 2012-10-06 MED ORDER — LABETALOL HCL 5 MG/ML IV SOLN: 10.0000 mg | Freq: Once | INTRAVENOUS | Status: DC

## 2012-10-06 MED ORDER — IOHEXOL 300 MG/ML  SOLN
100.0000 mL | Freq: Once | INTRAMUSCULAR | Status: AC | PRN
  Administered 2012-10-06: 100 mL via INTRAVENOUS

## 2012-10-06 MED ORDER — HYDROMORPHONE HCL PF 2 MG/ML IJ SOLN
2.0000 mg | Freq: Once | INTRAMUSCULAR | Status: AC
  Administered 2012-10-06: 2 mg via INTRAVENOUS
  Filled 2012-10-06: qty 1

## 2012-10-06 MED ORDER — ONDANSETRON HCL 4 MG PO TABS: 4.0000 mg | ORAL_TABLET | Freq: Four times a day (QID) | ORAL | Status: DC

## 2012-10-06 NOTE — ED Notes (Signed)
Pt out to CT.

## 2012-10-06 NOTE — ED Notes (Signed)
Dx with pancreatitis,  D/c yesterday and says he had no money for meds.  Here for abd pain

## 2012-10-06 NOTE — ED Provider Notes (Signed)
History  This chart was scribed for Paul Octave, MD by Paul [name] Day. This patient was seen in room APA01/APA01 and the patient's care was started at 1048.   CSN: [id number]  Arrival date & time 10/06/12  1048   First MD Initiated Contact with Patient 10/06/12 1451      Chief Complaint  Patient presents with  . Abdominal Pain   The history is provided by the patient. No language interpreter was used.   [name] is a 46 y.o. male who presents to the Emergency Department complaining of increased abdominal pain after being D/C from here yesterday with pancreatitis. He states did not have money to fill his prescriptions. He states has not had an ETOH drink since 4 days ago and usually gets DT's after alcohol withdrawal. He states was admitted 3 days ago and his last admission to the hospital was months ago. He denies CP, sob.   Past Medical History  Diagnosis Date  . Pancreatitis, acute     First episode earlier in 2012  . Hypertension     noncompliance  . Hyperlipidemia     noncompliance  . Lung collapse     h/o    Past Surgical History  Procedure Date  . Mandible fracture surgery 2006  . Left axillary     surgery for deep laceration    Family History  Problem Relation Age of Onset  . Diabetes type II Mother   . Liver disease Neg Hx   . Colon cancer Neg Hx   . GI problems Neg Hx     History  Substance Use Topics  . Smoking status: Current Every Day Smoker -- 1.0 packs/day    Types: Cigarettes  . Smokeless tobacco: Not on file  . Alcohol Use: Yes     Three 40oz per day      Review of Systems A complete 10 system review of systems was obtained and all systems are negative except as noted in the HPI and PMH.   Allergies  Review of patient's allergies indicates no known allergies.  Home Medications   Current Outpatient Rx  Name Route Sig Dispense Refill  . AMLODIPINE BESYLATE 10 MG PO TABS Oral Take 1 tablet (10 mg total) by mouth daily. 30 tablet 0   . LISINOPRIL 20 MG PO TABS Oral Take 1 tablet (20 mg total) by mouth daily. 30 tablet 0    Triage Vitals: BP 171/118  Pulse 96  Temp 98.8 F (37.1 C) (Oral)  Resp 18  Ht 6' (1.829 m)  Wt 170 lb (77.111 kg)  BMI 23.06 kg/m2  SpO2 99%  Physical Exam  Nursing note and vitals reviewed. Constitutional: He is oriented to person, place, and time. He appears well-developed and well-nourished. No distress.       Pt in NAD and comfortable  HENT:  Head: Normocephalic and atraumatic.  Eyes: EOM are normal.  Neck: Neck supple. No tracheal deviation present.  Cardiovascular: Normal rate, regular rhythm, normal heart sounds and intact distal pulses.        +2 femoral, DP and P pulses  Pulmonary/Chest: Effort normal. No respiratory distress.  Abdominal: Soft. Bowel sounds are normal. He exhibits no distension. There is tenderness (mild lower abdominal tenderness with guarding). There is guarding. There is no rebound.  Musculoskeletal: Normal range of motion.        No cva tenderness  Neurological: He is alert and oriented to person, place, and time.  Skin: Skin is warm and  dry.  Psychiatric: He has a normal mood and affect. His behavior is normal.    ED Course  Procedures (including critical care time) DIAGNOSTIC STUDIES: Oxygen Saturation is 99% on room air, normal by my interpretation.    COORDINATION OF CARE: At 305 PM Discussed treatment plan with patient which includes blood work, IV fluids, abdominal CT. Patient agrees.   Labs Reviewed  COMPREHENSIVE METABOLIC PANEL - Abnormal; Notable for the following:    Potassium 3.0 (*)     Chloride 92 (*)     Total Protein 8.5 (*)     All other components within normal limits  CBC WITH DIFFERENTIAL - Abnormal; Notable for the following:    MCH 34.5 (*)     All other components within normal limits  AMYLASE - Abnormal; Notable for the following:    Amylase 219 (*)     All other components within normal limits  LIPASE, BLOOD -  Abnormal; Notable for the following:    Lipase 407 (*)     All other components within normal limits  URINALYSIS, ROUTINE W REFLEX MICROSCOPIC - Abnormal; Notable for the following:    Color, Urine ORANGE (*)  BIOCHEMICALS MAY BE AFFECTED BY COLOR   Hgb urine dipstick MODERATE (*)     Bilirubin Urine SMALL (*)     Ketones, ur 15 (*)     Protein, ur 30 (*)     All other components within normal limits  LACTIC ACID, PLASMA  TROPONIN I  URINE MICROSCOPIC-ADD ON   Ct Abdomen Pelvis W Contrast  10/06/2012  *RADIOLOGY REPORT*  Clinical Data: Abdominal pain, history of pancreatitis, nausea  CT ABDOMEN AND PELVIS WITH CONTRAST  Technique:  Multidetector CT imaging of the abdomen and pelvis was performed following the standard protocol during bolus administration of intravenous contrast.  Contrast: OMNIPAQUE IOHEXOL 300 MG/ML  SOLN  Comparison: CT abdomen pelvis - 07/25/2011; 05/06/2009  Findings:  There is a grossly unchanged mild mesenteric stranding about the pancreas.  Interval development of several dystrophic calcifications within the pancreatic head.  Mild pancreatic ductal dilatation (image 22, series 2) is suspected on this non pancreatic protocol CT.  There appears to be homogeneous enhancement of the pancreas without discrete area of necrosis.  No discrete or drainable fluid collection.  The splenic vein is patent.   Several enlarged varices are seen about the lesser curvature of the stomach (images 15 through 18, series 2) are redemonstrated, unchanged. The portal vein is patent.  Normal hepatic contour.  No splenomegaly. No ascites.  Geographic area of decreased attenuation adjacent to the fissure for the ligamentum teres (image 23) favored to represent focal fatty infiltration.  Normal appearance of the gallbladder.  No definite intra or extrahepatic biliary ductal dilatation.  No ascites.  There is symmetric enhancement and excretion of the bilateral kidneys.  Incidental note is made of an  approximately 5 mm nonobstructing stone within the mid aspect of the right kidney.  No discrete renal lesions.  No urinary obstruction.  Normal appearance of the bilateral adrenal glands and spleen.  Ingested enteric contrast extends to the mid small bowel.  Moderate colonic stool burden without definite evidence of obstruction. Normal appearance of the retrocecal appendix.  No evidence of enteric obstruction.  Bowel is normal in course and caliber.  No pneumoperitoneum, pneumatosis or portal venous gas.  Normal caliber of the abdominal aorta.  No retroperitoneal, mesenteric, pelvic or inguinal lymphadenopathy.  The prostate is borderline enlarged for age.  No  free fluid in the pelvis.  Small left-sided direct mesenteric fat containing inguinal hernia.  Limited visualization of the lower thorax is negative focal airspace opacity or pleural effusion.  Normal heart size.  No pericardial effusion.  No acute or aggressive osseous abnormalities.  Bilateral L5 pars defects with associated approximate 5 mm of anterolisthesis of L5 upon S1 and moderate to severe degenerative change at this level, unchanged.  IMPRESSION:  1.  Findings compatible with acute pancreatitis. No definite evidence of pancreatic necrosis.  No drainable fluid collections.  2.  Interval development of pancreatic calcifications suggestive of chronic pancreatitis/calcific pancreatitis with mild apparent dilatation of the pancreatic duct. No definite pancreatic mass on this non pancreatic protocol CT.  Further evaluation with either pancreatic protocol CT or MRCP may be performed in the non acute setting as clinically indicated.  3.  The splenic vein appears patent, however prominent varices about the lesser curvature of the stomach remain.  Otherwise, no definite stigmata of portal venous hypertension.  4.  Approximately 5 mm nonobstructing stone within the right kidney.  5.  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   Original Report Authenticated By: Waynard Reeds, M.D.      No diagnosis found.    MDM  History of alcoholic pancreatitis, discharged yesterday for same. Presents with lower abdominal pain and hypertension. States has no money to fill medications. States has not had alcohol for the past 5 days.  CT scan confirms acute pancreatitis. No mass seen. Lipase trending down. No vomiting in the ED. Patient appears comfortable. He is given his home blood pressure medications and states that he has not yet filled his prescriptions.  With treatment blood pressure has improved to 134/89. Patient states he has his prescriptions for his medicines but has not filled them. He is feeling better denies abdominal pain. He said no vomiting in the ED. He is requesting discharge. Alcohol cessation encouraged.  I personally performed the services described in this documentation, which was scribed in my presence.  The recorded information has been reviewed and considered.       Paul Octave, MD 10/06/12 [phone]

## 2012-11-05 ENCOUNTER — Inpatient Hospital Stay (HOSPITAL_COMMUNITY): Payer: Self-pay

## 2012-11-05 ENCOUNTER — Inpatient Hospital Stay (HOSPITAL_COMMUNITY)
Admission: EM | Admit: 2012-11-05 | Discharge: 2012-11-10 | DRG: 439 | Disposition: A | Discharge status: Home / Self Care | Payer: MEDICAID | Attending: Internal Medicine | Admitting: Internal Medicine

## 2012-11-05 ENCOUNTER — Encounter (HOSPITAL_COMMUNITY): Payer: Self-pay | Admitting: Emergency Medicine

## 2012-11-05 DIAGNOSIS — Z5900 Homelessness unspecified: Secondary | ICD-10-CM

## 2012-11-05 DIAGNOSIS — K859 Acute pancreatitis without necrosis or infection, unspecified: Principal | ICD-10-CM | POA: Diagnosis present

## 2012-11-05 DIAGNOSIS — K920 Hematemesis: Secondary | ICD-10-CM

## 2012-11-05 DIAGNOSIS — F1410 Cocaine abuse, uncomplicated: Secondary | ICD-10-CM

## 2012-11-05 DIAGNOSIS — F1010 Alcohol abuse, uncomplicated: Secondary | ICD-10-CM | POA: Diagnosis present

## 2012-11-05 DIAGNOSIS — F1020 Alcohol dependence, uncomplicated: Secondary | ICD-10-CM | POA: Diagnosis present

## 2012-11-05 DIAGNOSIS — E785 Hyperlipidemia, unspecified: Secondary | ICD-10-CM | POA: Diagnosis present

## 2012-11-05 DIAGNOSIS — Z9119 Patient's noncompliance with other medical treatment and regimen: Secondary | ICD-10-CM

## 2012-11-05 DIAGNOSIS — I10 Essential (primary) hypertension: Secondary | ICD-10-CM | POA: Diagnosis present

## 2012-11-05 DIAGNOSIS — F17200 Nicotine dependence, unspecified, uncomplicated: Secondary | ICD-10-CM | POA: Diagnosis present

## 2012-11-05 DIAGNOSIS — K861 Other chronic pancreatitis: Secondary | ICD-10-CM | POA: Diagnosis present

## 2012-11-05 DIAGNOSIS — Z720 Tobacco use: Secondary | ICD-10-CM | POA: Diagnosis present

## 2012-11-05 DIAGNOSIS — Z91199 Patient's noncompliance with other medical treatment and regimen due to unspecified reason: Secondary | ICD-10-CM

## 2012-11-05 DIAGNOSIS — K7689 Other specified diseases of liver: Secondary | ICD-10-CM | POA: Diagnosis present

## 2012-11-05 DIAGNOSIS — K852 Alcohol induced acute pancreatitis without necrosis or infection: Secondary | ICD-10-CM | POA: Diagnosis present

## 2012-11-05 DIAGNOSIS — Z79899 Other long term (current) drug therapy: Secondary | ICD-10-CM

## 2012-11-05 HISTORY — DX: Alcohol abuse, uncomplicated: F10.10

## 2012-11-05 HISTORY — DX: Calculus of kidney: N20.0

## 2012-11-05 HISTORY — DX: Alcohol dependence with withdrawal delirium: F10.231

## 2012-11-05 HISTORY — DX: Tobacco use: Z72.0

## 2012-11-05 HISTORY — DX: Cocaine abuse, uncomplicated: F14.10

## 2012-11-05 HISTORY — DX: Alcohol use, unspecified with withdrawal delirium: F10.931

## 2012-11-05 LAB — CBC WITH DIFFERENTIAL/PLATELET
Basophils Absolute: 0 10*3/uL (ref 0.0–0.1)
Basophils Relative: 0 % (ref 0–1)
Hemoglobin: 15.6 g/dL (ref 13.0–17.0)
MCHC: 34.1 g/dL (ref 30.0–36.0)
Monocytes Relative: 7 % (ref 3–12)
Neutro Abs: 5.4 10*3/uL (ref 1.7–7.7)
Neutrophils Relative %: 75 % (ref 43–77)
Platelets: 184 10*3/uL (ref 150–400)

## 2012-11-05 LAB — LIPASE, BLOOD: Lipase: 862 U/L — ABNORMAL HIGH (ref 11–59)

## 2012-11-05 LAB — COMPREHENSIVE METABOLIC PANEL
ALT: 15 U/L (ref 0–53)
AST: 28 U/L (ref 0–37)
Albumin: 4.1 g/dL (ref 3.5–5.2)
Alkaline Phosphatase: 67 U/L (ref 39–117)
Glucose, Bld: 101 mg/dL — ABNORMAL HIGH (ref 70–99)
Potassium: 4.2 mEq/L (ref 3.5–5.1)
Sodium: 145 mEq/L (ref 135–145)
Total Protein: 8 g/dL (ref 6.0–8.3)

## 2012-11-05 MED ORDER — IOHEXOL 300 MG/ML  SOLN
100.0000 mL | Freq: Once | INTRAMUSCULAR | Status: AC | PRN
  Administered 2012-11-05: 100 mL via INTRAVENOUS

## 2012-11-05 MED ORDER — POTASSIUM CHLORIDE IN NACL 20-0.9 MEQ/L-% IV SOLN
INTRAVENOUS | Status: DC
  Administered 2012-11-05 – 2012-11-10 (×13): via INTRAVENOUS

## 2012-11-05 MED ORDER — VITAMIN B-1 100 MG PO TABS
100.0000 mg | ORAL_TABLET | Freq: Every day | ORAL | Status: DC
  Administered 2012-11-05 – 2012-11-10 (×6): 100 mg via ORAL
  Filled 2012-11-05 (×6): qty 1

## 2012-11-05 MED ORDER — ONDANSETRON HCL 4 MG/2ML IJ SOLN: 4.0000 mg | Freq: Three times a day (TID) | INTRAMUSCULAR | Status: DC | PRN

## 2012-11-05 MED ORDER — ONDANSETRON HCL 4 MG PO TABS: 4.0000 mg | ORAL_TABLET | Freq: Four times a day (QID) | ORAL | Status: DC | PRN

## 2012-11-05 MED ORDER — THIAMINE HCL 100 MG/ML IJ SOLN: 100.0000 mg | Freq: Every day | INTRAMUSCULAR | Status: DC

## 2012-11-05 MED ORDER — AMLODIPINE BESYLATE 5 MG PO TABS
10.0000 mg | ORAL_TABLET | Freq: Once | ORAL | Status: AC
  Administered 2012-11-05: 10 mg via ORAL
  Filled 2012-11-05: qty 2

## 2012-11-05 MED ORDER — MORPHINE SULFATE 4 MG/ML IJ SOLN
4.0000 mg | Freq: Once | INTRAMUSCULAR | Status: AC
  Administered 2012-11-05: 4 mg via INTRAVENOUS
  Filled 2012-11-05: qty 1

## 2012-11-05 MED ORDER — LORAZEPAM 1 MG PO TABS
1.0000 mg | ORAL_TABLET | Freq: Four times a day (QID) | ORAL | Status: AC | PRN
  Administered 2012-11-06 – 2012-11-08 (×3): 1 mg via ORAL
  Filled 2012-11-05 (×4): qty 1

## 2012-11-05 MED ORDER — LISINOPRIL 10 MG PO TABS
20.0000 mg | ORAL_TABLET | Freq: Once | ORAL | Status: AC
  Administered 2012-11-05: 20 mg via ORAL
  Filled 2012-11-05: qty 2

## 2012-11-05 MED ORDER — SODIUM CHLORIDE 0.9 % IV SOLN
INTRAVENOUS | Status: DC
  Administered 2012-11-05: 10:00:00 via INTRAVENOUS

## 2012-11-05 MED ORDER — HYDROMORPHONE HCL PF 1 MG/ML IJ SOLN
1.0000 mg | INTRAMUSCULAR | Status: DC | PRN
  Administered 2012-11-06 – 2012-11-09 (×21): 1 mg via INTRAVENOUS
  Filled 2012-11-05 (×21): qty 1

## 2012-11-05 MED ORDER — HYDROMORPHONE HCL PF 1 MG/ML IJ SOLN
1.0000 mg | Freq: Once | INTRAMUSCULAR | Status: AC
  Administered 2012-11-05: 1 mg via INTRAVENOUS
  Filled 2012-11-05: qty 1

## 2012-11-05 MED ORDER — ONDANSETRON HCL 4 MG/2ML IJ SOLN
4.0000 mg | Freq: Four times a day (QID) | INTRAMUSCULAR | Status: DC | PRN
  Administered 2012-11-05 – 2012-11-08 (×6): 4 mg via INTRAVENOUS
  Filled 2012-11-05 (×6): qty 2

## 2012-11-05 MED ORDER — LISINOPRIL 10 MG PO TABS
20.0000 mg | ORAL_TABLET | Freq: Every day | ORAL | Status: DC
  Administered 2012-11-05 – 2012-11-06 (×2): 20 mg via ORAL
  Filled 2012-11-05 (×2): qty 2

## 2012-11-05 MED ORDER — CLONIDINE HCL 0.2 MG PO TABS
0.2000 mg | ORAL_TABLET | ORAL | Status: AC
  Administered 2012-11-05: 0.2 mg via ORAL
  Filled 2012-11-05: qty 1

## 2012-11-05 MED ORDER — ENOXAPARIN SODIUM 40 MG/0.4ML ~~LOC~~ SOLN
40.0000 mg | SUBCUTANEOUS | Status: DC
  Administered 2012-11-05 – 2012-11-09 (×5): 40 mg via SUBCUTANEOUS
  Filled 2012-11-05 (×5): qty 0.4

## 2012-11-05 MED ORDER — HYDROMORPHONE HCL PF 1 MG/ML IJ SOLN
1.0000 mg | INTRAMUSCULAR | Status: DC | PRN
  Administered 2012-11-05: 1 mg via INTRAVENOUS
  Filled 2012-11-05: qty 1

## 2012-11-05 MED ORDER — ACETAMINOPHEN 325 MG PO TABS: 650.0000 mg | ORAL_TABLET | Freq: Four times a day (QID) | ORAL | Status: DC | PRN

## 2012-11-05 MED ORDER — LORAZEPAM 2 MG/ML IJ SOLN
0.0000 mg | Freq: Four times a day (QID) | INTRAMUSCULAR | Status: AC
  Administered 2012-11-05: 2 mg via INTRAVENOUS
  Filled 2012-11-05: qty 1

## 2012-11-05 MED ORDER — HYDRALAZINE HCL 20 MG/ML IJ SOLN
10.0000 mg | Freq: Four times a day (QID) | INTRAMUSCULAR | Status: DC | PRN
  Administered 2012-11-05 – 2012-11-06 (×2): 10 mg via INTRAVENOUS
  Filled 2012-11-05 (×2): qty 1

## 2012-11-05 MED ORDER — ADULT MULTIVITAMIN W/MINERALS CH
1.0000 | ORAL_TABLET | Freq: Every day | ORAL | Status: DC
  Administered 2012-11-05 – 2012-11-10 (×6): 1 via ORAL
  Filled 2012-11-05 (×6): qty 1

## 2012-11-05 MED ORDER — ACETAMINOPHEN 650 MG RE SUPP: 650.0000 mg | Freq: Four times a day (QID) | RECTAL | Status: DC | PRN

## 2012-11-05 MED ORDER — SODIUM CHLORIDE 0.9 % IV SOLN: INTRAVENOUS | Status: DC

## 2012-11-05 MED ORDER — ONDANSETRON HCL 4 MG/2ML IJ SOLN
4.0000 mg | Freq: Once | INTRAMUSCULAR | Status: AC
  Administered 2012-11-05: 4 mg via INTRAVENOUS
  Filled 2012-11-05: qty 2

## 2012-11-05 MED ORDER — LORAZEPAM 2 MG/ML IJ SOLN: 1.0000 mg | Freq: Four times a day (QID) | INTRAMUSCULAR | Status: AC | PRN

## 2012-11-05 MED ORDER — LORAZEPAM 2 MG/ML IJ SOLN: 0.0000 mg | Freq: Two times a day (BID) | INTRAMUSCULAR | Status: AC

## 2012-11-05 MED ORDER — SODIUM CHLORIDE 0.9 % IJ SOLN
INTRAMUSCULAR | Status: AC
  Administered 2012-11-05: 10 mL
  Filled 2012-11-05: qty 6

## 2012-11-05 MED ORDER — AMLODIPINE BESYLATE 5 MG PO TABS
10.0000 mg | ORAL_TABLET | Freq: Every day | ORAL | Status: DC
  Administered 2012-11-05 – 2012-11-10 (×6): 10 mg via ORAL
  Filled 2012-11-05 (×6): qty 2

## 2012-11-05 MED ORDER — FOLIC ACID 1 MG PO TABS
1.0000 mg | ORAL_TABLET | Freq: Every day | ORAL | Status: DC
  Administered 2012-11-05 – 2012-11-10 (×6): 1 mg via ORAL
  Filled 2012-11-05 (×6): qty 1

## 2012-11-05 MED ORDER — DILTIAZEM HCL 25 MG/5ML IV SOLN
20.0000 mg | Freq: Once | INTRAVENOUS | Status: AC
  Administered 2012-11-05: 20 mg via INTRAVENOUS
  Filled 2012-11-05: qty 5

## 2012-11-05 NOTE — ED Notes (Signed)
Pt c/o generalized abd pain with n/v since 0600.

## 2012-11-05 NOTE — ED Notes (Signed)
Pt states the morphine did not help his pain at all. New orders received.

## 2012-11-05 NOTE — ED Provider Notes (Signed)
History     CSN: [id number]  Arrival date & time 11/05/12  2440   First MD Initiated Contact with Patient 11/05/12 [phone]      Chief Complaint  Patient presents with  . Abdominal Pain  . Nausea  . Emesis    (Consider location/radiation/quality/duration/timing/severity/associated sxs/prior treatment) HPI Comments: Paul [name] presents with a 3 hour history of periumbilical abdominal pain with nausea and nonbloody emesis x 2.  He reports pain is constant,  Sharp and consistent with past episodes of alcohol induced pancreatitis.  He denies fever, chills, chest pain and shortness of breath. He also denies bowel changes. He has not taken any medications today and reports has not taken his blood pressure medicines in at least 2 weeks.  He last drank etoh yesterday evening.  He drinks daily and does have a history of withdrawal dt's.   The history is provided by the patient.    Past Medical History  Diagnosis Date  . Pancreatitis, acute     First episode earlier in 2012  . Hypertension     noncompliance  . Hyperlipidemia     noncompliance  . Lung collapse     h/o    Past Surgical History  Procedure Date  . Mandible fracture surgery 2006  . Left axillary     surgery for deep laceration    Family History  Problem Relation Age of Onset  . Diabetes type II Mother   . Liver disease Neg Hx   . Colon cancer Neg Hx   . GI problems Neg Hx     History  Substance Use Topics  . Smoking status: Current Every Day Smoker -- 1.0 packs/day    Types: Cigarettes  . Smokeless tobacco: Not on file  . Alcohol Use: Yes     Comment: Three 40oz per day      Review of Systems  Constitutional: Negative for fever, chills and diaphoresis.  HENT: Negative for congestion, sore throat and neck pain.   Eyes: Negative.   Respiratory: Negative for chest tightness and shortness of breath.   Cardiovascular: Negative for chest pain.  Gastrointestinal: Positive for nausea, vomiting and  abdominal pain. Negative for diarrhea, constipation and abdominal distention.  Genitourinary: Negative.  Negative for hematuria and decreased urine volume.  Musculoskeletal: Negative for joint swelling and arthralgias.  Skin: Negative.  Negative for rash and wound.  Neurological: Negative for dizziness, weakness, light-headedness, numbness and headaches.  Hematological: Negative.   Psychiatric/Behavioral: Negative.     Allergies  Review of patient's allergies indicates no known allergies.  Home Medications   Current Outpatient Rx  Name  Route  Sig  Dispense  Refill  . AMLODIPINE BESYLATE 10 MG PO TABS   Oral   Take 1 tablet (10 mg total) by mouth daily.   30 tablet   0   . LISINOPRIL 20 MG PO TABS   Oral   Take 1 tablet (20 mg total) by mouth daily.   30 tablet   0   . ONDANSETRON HCL 4 MG PO TABS   Oral   Take 1 tablet (4 mg total) by mouth every 6 (six) hours.   12 tablet   0   . OXYCODONE-ACETAMINOPHEN 5-325 MG PO TABS   Oral   Take 2 tablets by mouth every 4 (four) hours as needed for pain.   15 tablet   0     BP 189/123  Pulse 97  Resp 18  Ht 6' (1.829 m)  [employer]  170 lb (77.111 kg)  BMI 23.06 kg/m2  SpO2 98%  Physical Exam  Nursing note and vitals reviewed. Constitutional: He appears well-developed and well-nourished.  HENT:  Head: Normocephalic and atraumatic.  Eyes: Conjunctivae normal are normal.  Neck: Normal range of motion.  Cardiovascular: Normal rate, regular rhythm, normal heart sounds and intact distal pulses.   Pulmonary/Chest: Effort normal and breath sounds normal. He has no wheezes.  Abdominal: Bowel sounds are normal. He exhibits no distension and no mass. There is tenderness in the right upper quadrant, epigastric area, periumbilical area and left upper quadrant. There is guarding. There is no rebound.  Musculoskeletal: Normal range of motion.  Neurological: He is alert.  Skin: Skin is warm and dry.  Psychiatric: He has a normal mood and  affect.    ED Course  Procedures (including critical care time)   Labs Reviewed  CBC WITH DIFFERENTIAL  LIPASE, BLOOD  COMPREHENSIVE METABOLIC PANEL  URINALYSIS, ROUTINE W REFLEX MICROSCOPIC   No results found.   No diagnosis found.   Diagnosis:  Acute pancreatitis  Pt given IV fluids,  Morphine 4 mg IV, zofran 4 mg IV with no relief of pain.  Given didaudid 1 mg IV with improved but persistent pain.     MDM  Labs revealing for acute pancreatitis with (probable) increasing lipase - 862 today.  Spoke with Dr Kerry Hough who accepts admission for this patient.  Temp admit orders given.          Burgess Amor, PA 11/05/12 1436

## 2012-11-05 NOTE — ED Provider Notes (Signed)
Medical screening examination/treatment/procedure(s) were conducted as a shared visit with non-physician practitioner(s) and myself.  I personally evaluated the patient during the encounter  Hx alcoholic pancreatitis presenting with typical abdominal pain with nausea and vomiting after drinking alcohol. No peritoneal signs.  CT done recently.  Glynn Octave, MD 11/05/12 1718

## 2012-11-05 NOTE — H&P (Signed)
[facility] History and Physical  LANNIE YUSUF ZOX:[id number] DOB: [birth year] DOA: 11/05/2012  Referring physician: Dr. Manus Gunning PCP: No primary provider on file.  Specialists:   Chief Complaint: abdominal pain  HPI: Paul [name] is a 46 y.o. 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 Patient was in her usual state of health when last night he reports turning alcohol. He was up this morning with abdominal pain, mostly located in the periumbilical area, associated nausea and dry heaves. He reports not being able to keep any food down. He has not noticed any fevers, but has been diaphoretic. Denies any diarrhea, dysuria, shortness of breath or cough. He was evaluated in the emergency room and was found to have elevated lipase consistent with acute on chronic pancreatitis. Patient reports that he continues to drink alcohol. When asked to quantify, he says "drink everything that I can get my hands on". He reports using cocaine last night. He does report a history of alcohol withdrawal.  Review of Systems: Pertinent positives as per history of present illness, otherwise negative  Past Medical History  Diagnosis Date  . Pancreatitis, acute     First episode earlier in 2012  . Hypertension     noncompliance  . Hyperlipidemia     noncompliance  . Lung collapse     h/o   Past Surgical History  Procedure Date  . Mandible fracture surgery 2006  . Left axillary     surgery for deep laceration   Social History:  reports that he has been smoking Cigarettes.  He has been smoking about 1 pack per day. He does not have any smokeless tobacco history on file. He reports that he drinks alcohol. He reports that he uses illicit drugs (Cocaine) about 3 times per week. Lives independently  No Known Allergies  Family History  Problem Relation Age of Onset  . Diabetes type II Mother   . Liver disease Neg Hx   . Colon cancer Neg Hx     . GI problems Neg Hx    Prior to Admission medications   Medication Sig Start Date End Date Taking? Authorizing Provider  amLODipine (NORVASC) 10 MG tablet Take 1 tablet (10 mg total) by mouth daily. 10/05/12   Nimish Normajean Glasgow, MD  lisinopril (PRINIVIL,ZESTRIL) 20 MG tablet Take 1 tablet (20 mg total) by mouth daily. 10/05/12   Nimish Normajean Glasgow, MD  ondansetron (ZOFRAN) 4 MG tablet Take 1 tablet (4 mg total) by mouth every 6 (six) hours. 10/06/12   Glynn Octave, MD  oxyCODONE-acetaminophen (PERCOCET/ROXICET) 5-325 MG per tablet Take 2 tablets by mouth every 4 (four) hours as needed for pain. 10/06/12   Glynn Octave, MD   Physical Exam: Filed Vitals:   11/05/12 0904 11/05/12 1203 11/05/12 1343  BP: 189/123 198/130 200/134  Pulse: 97 100   Temp:  97.8 F (36.6 C)   TempSrc:  Oral   Resp: 18 18   Height: 6' (1.829 m)    Weight: 77.111 kg (170 lb)    SpO2: 98% 97%      General:  No acute distress, lying in bed in  Eyes: Normocephalic, atraumatic, pupils are equal round react to light  ENT: No pharyngeal erythema  Neck: Supple  Cardiovascular: S1, S2, regular rate and rhythm  Respiratory: Clear to auscultation bilaterally  Abdomen: Soft, tender in the periumbilical area, bowel sounds are active  Skin: Normal  Musculoskeletal: Deferred  Psychiatric: Normal affect, cooperative  with exam  Neurologic: Grossly intact, nonfocal  Labs on Admission:  Basic Metabolic Panel:  Lab 11/05/12 1610  NA 145  K 4.2  CL 105  CO2 26  GLUCOSE 101*  BUN 11  CREATININE 0.91  CALCIUM 9.3  MG --  PHOS --   Liver Function Tests:  Lab 11/05/12 0913  AST 28  ALT 15  ALKPHOS 67  BILITOT 0.6  PROT 8.0  ALBUMIN 4.1    Lab 11/05/12 0913  LIPASE 862*  AMYLASE --   No results found for this basename: AMMONIA:5 in the last 168 hours CBC:  Lab 11/05/12 0913  WBC 7.3  NEUTROABS 5.4  HGB 15.6  HCT 45.8  MCV 99.3  PLT 184   Cardiac Enzymes: No results found for this  basename: CKTOTAL:5,CKMB:5,CKMBINDEX:5,TROPONINI:5 in the last 168 hours  BNP (last 3 results) No results found for this basename: PROBNP:3 in the last 8760 hours CBG: No results found for this basename: GLUCAP:5 in the last 168 hours  Radiological Exams on Admission: No results found.   Assessment/Plan Principal Problem:  *Pancreatitis, alcoholic, acute Active Problems:  HTN (hypertension)  Cocaine abuse  Alcohol abuse  Tobacco abuse   1. Acute on chronic alcoholic pancreatitis. Patient has been extensively counseled regarding the importance of abstinence from alcohol. He'll receive supportive care. We will start him on clear liquids for now and can be advanced to low-fat as tolerated. He'll receive IV fluids and pain medications. 2. Hypertension. Likely uncontrolled since patient is unable to tolerate his meds. Likely exacerbated by cocaine abuse. Will avoid beta blockers with ongoing cocaine abuse. Continue his outpatient regimen and use when necessary hydralazine/clonidine. 3. Alcohol abuse. Patient was placed on alcohol withdrawal protocol with Ativan. 4. Cocaine abuse. Patient was counseled regarding the importance of quitting his ongoing drug abuse.    Code Status: Full code Family Communication: Discussed with patient, no family present  Disposition Plan: Discharge home once medically improved  Time spent: 45 minutes  MEMON,JEHANZEB [facility] Pager [phone]  If 7PM-7AM, please contact night-coverage [email or web address] Password [facility] 11/05/2012, 4:37 PM

## 2012-11-06 LAB — BASIC METABOLIC PANEL
Calcium: 9.6 mg/dL (ref 8.4–10.5)
GFR calc Af Amer: >90 mL/min (ref >90)
GFR calc non Af Amer: >90 mL/min (ref >90)
Potassium: 4.1 mEq/L (ref 3.5–5.1)
Sodium: 133 mEq/L — ABNORMAL LOW (ref 135–145)

## 2012-11-06 LAB — LIPASE, BLOOD: Lipase: 404 U/L — ABNORMAL HIGH (ref 11–59)

## 2012-11-06 LAB — CBC
Hemoglobin: 15.3 g/dL (ref 13.0–17.0)
Platelets: 183 10*3/uL (ref 150–400)
RBC: 4.51 MIL/uL (ref 4.22–5.81)

## 2012-11-06 MED ORDER — HYDRALAZINE HCL 25 MG PO TABS
50.0000 mg | ORAL_TABLET | Freq: Three times a day (TID) | ORAL | Status: DC
  Administered 2012-11-06 – 2012-11-10 (×13): 50 mg via ORAL
  Filled 2012-11-06 (×3): qty 2
  Filled 2012-11-06 (×2): qty 1
  Filled 2012-11-06: qty 2
  Filled 2012-11-06: qty 1
  Filled 2012-11-06 (×3): qty 2
  Filled 2012-11-06: qty 1
  Filled 2012-11-06 (×4): qty 2

## 2012-11-06 MED ORDER — LISINOPRIL 10 MG PO TABS
40.0000 mg | ORAL_TABLET | Freq: Every day | ORAL | Status: DC
  Administered 2012-11-07 – 2012-11-10 (×4): 40 mg via ORAL
  Filled 2012-11-06 (×4): qty 4

## 2012-11-06 MED ORDER — CLONIDINE HCL 0.2 MG PO TABS
0.2000 mg | ORAL_TABLET | Freq: Three times a day (TID) | ORAL | Status: DC | PRN
  Administered 2012-11-06 – 2012-11-08 (×3): 0.2 mg via ORAL
  Filled 2012-11-06 (×4): qty 1

## 2012-11-06 MED ORDER — LISINOPRIL 10 MG PO TABS
20.0000 mg | ORAL_TABLET | ORAL | Status: AC
  Administered 2012-11-06: 20 mg via ORAL
  Filled 2012-11-06: qty 2

## 2012-11-06 NOTE — Plan of Care (Signed)
Problem: Phase III Progression Outcomes Goal: Activity at appropriate level-compared to baseline (UP IN CHAIR FOR HEMODIALYSIS)  Outcome: Progressing Pt ambulating in room     

## 2012-11-06 NOTE — Plan of Care (Signed)
Problem: Phase II Progression Outcomes Goal: Vital signs remain stable Outcome: Not Progressing BP remains elevated.  Dr. Kerry Hough notified.  Orders changed.

## 2012-11-06 NOTE — Progress Notes (Signed)
[facility]             Progress Note   Subjective: Patient has continued abd pain, nausea and vomiting.  Objective: Vital signs in last 24 hours: Temp:  [97.6 F (36.4 C)-98.4 F (36.9 C)] 97.6 F (36.4 C) (11/08 4098) Pulse Rate:  [86-110] 86  (11/08 0851) Resp:  [16-18] 18  (11/08 0614) BP: (156-226)/(108-152) 208/141 mmHg (11/08 0851) SpO2:  [97 %-100 %] 97 % (11/08 0851) Weight change:  Last BM Date: 11/05/12  Intake/Output from previous day: 11/07 0701 - 11/08 0700 In: 1795 [I.V.:1795] Out: -      Physical Exam: General: Alert, awake, oriented x3, in no acute distress. HEENT: No bruits, no goiter. Heart: Regular rate and rhythm, without murmurs, rubs, gallops. Lungs: Clear to auscultation bilaterally. Abdomen: Soft, nontender, nondistended, positive bowel sounds. Extremities: No clubbing cyanosis or edema with positive pedal pulses. Neuro: Grossly intact, nonfocal.    Lab Results: Basic Metabolic Panel:  Basename 11/06/12 0540 11/05/12 0913  NA 133* 145  K 4.1 4.2  CL 96 105  CO2 26 26  GLUCOSE 105* 101*  BUN 7 11  CREATININE 0.71 0.91  CALCIUM 9.6 9.3  MG -- --  PHOS -- --   Liver Function Tests:  [facility] 11/05/12 0913  AST 28  ALT 15  ALKPHOS 67  BILITOT 0.6  PROT 8.0  ALBUMIN 4.1    Basename 11/06/12 0540 11/05/12 0913  LIPASE 404* 862*  AMYLASE -- --   No results found for this basename: AMMONIA:2 in the last 72 hours CBC:  Basename 11/06/12 0540 11/05/12 0913  WBC 5.9 7.3  NEUTROABS -- 5.4  HGB 15.3 15.6  HCT 43.9 45.8  MCV 97.3 99.3  PLT 183 184   Cardiac Enzymes: No results found for this basename: CKTOTAL:3,CKMB:3,CKMBINDEX:3,TROPONINI:3 in the last 72 hours BNP: No results found for this basename: PROBNP:3 in the last 72 hours D-Dimer: No results found for this basename: DDIMER:2 in the last 72 hours CBG: No results found for this basename: GLUCAP:6 in the last 72 hours Hemoglobin A1C: No results  found for this basename: HGBA1C in the last 72 hours Fasting Lipid Panel: No results found for this basename: CHOL,HDL,LDLCALC,TRIG,CHOLHDL,LDLDIRECT in the last 72 hours Thyroid Function Tests: No results found for this basename: TSH,T4TOTAL,FREET4,T3FREE,THYROIDAB in the last 72 hours Anemia Panel: No results found for this basename: VITAMINB12,FOLATE,FERRITIN,TIBC,IRON,RETICCTPCT in the last 72 hours Coagulation: No results found for this basename: LABPROT:2,INR:2 in the last 72 hours Urine Drug Screen: Drugs of Abuse     Component Value Date/Time   LABOPIA NONE DETECTED 10/03/2012 1542   COCAINSCRNUR POSITIVE* 10/03/2012 1542   LABBENZ NONE DETECTED 10/03/2012 1542   AMPHETMU NONE DETECTED 10/03/2012 1542   THCU NONE DETECTED 10/03/2012 1542   LABBARB NONE DETECTED 10/03/2012 1542    Alcohol Level: No results found for this basename: ETH:2 in the last 72 hours Urinalysis: No results found for this basename: COLORURINE:2,APPERANCEUR:2,LABSPEC:2,PHURINE:2,GLUCOSEU:2,HGBUR:2,BILIRUBINUR:2,KETONESUR:2,PROTEINUR:2,UROBILINOGEN:2,NITRITE:2,LEUKOCYTESUR:2 in the last 72 hours  No results found for this or any previous visit (from the past 240 hour(s)).  Studies/Results: Ct Abdomen Pelvis W Contrast  11/05/2012  *RADIOLOGY REPORT*  Clinical Data: Right lower quadrant abdominal pain.  Nausea and vomiting.  Prior history of pancreatitis.  CT ABDOMEN AND PELVIS WITH CONTRAST  Technique:  Multidetector CT imaging of the abdomen and pelvis was performed following the standard protocol during bolus administration of intravenous contrast.  Contrast: OMNIPAQUE IOHEXOL 300 MG/ML. Oral contrast was also administered.  Comparison: CT  abdomen and pelvis 10/06/2012, 07/25/2011, 05/06/2009.  Findings: Calcifications within the head of the pancreas as noted previously.  Edema surrounding the head of the pancreas, with interval resolution of the edema involving the body and tail of the pancreas since the  examination 1 month ago.  Wall thickening involving the medial wall of the descending duodenum.  No evidence of pseudocyst.  Entire pancreas enhances normally.  Diffuse hepatic steatosis without focal hepatic parenchymal abnormality.  Normal appearing spleen, adrenal glands, and left kidney.  Non-obstructing approximate 4 x 5 mm calculus in a mid calix of the right kidney as noted previously; right kidney otherwise unremarkable.  Gallbladder unremarkable by CT.  No biliary ductal dilation.  Mild bilateral internal iliac artery atherosclerosis.  No significant lymphadenopathy.  Stomach distended with oral contrast material.  Small hiatal hernia.  Apart from the duodenum, the remainder of the small bowel is normal in appearance.  Apparent wall thickening involving the distal descending and sigmoid colon is due to the fact that these segments are decompressed.  Colon unremarkable.  Normal appendix in the right upper pelvis.  No ascites.  Urinary bladder unremarkable.  Prostate gland upper normal in size. Numerous pelvic phleboliths.  Seminal vesicles.  Visualized lung bases clear.  Bone window images straight bilateral L5 pars defects with grade 1 spondylolisthesis of L5 on S1 approximating 6 mm, degenerative disc disease and disc protrusion at L5-S1.  IMPRESSION:  1.  Residual or recurrent pancreatitis involving the head of the pancreas.  No evidence of pseudocyst or other complicating features.  Calcifications in the head of the pancreas are consistent with chronic calcific pancreatitis. 2.  Secondary inflammation of the medial wall of the duodenum without obstruction.  3.  Diffuse hepatic steatosis without focal hepatic parenchymal abnormality. 4.  Non-obstructing 4 x 5 mm calculus in a mid calix of the right kidney. 5.  Small hiatal hernia.   Original Report Authenticated By: Hulan Saas, M.D.     Medications: Scheduled Meds:   . amLODipine  10 mg Oral Daily  . [COMPLETED] cloNIDine  0.2 mg Oral NOW  .  [COMPLETED] diltiazem  20 mg Intravenous Once  . enoxaparin (LOVENOX) injection  40 mg Subcutaneous Q24H  . folic acid  1 mg Oral Daily  . [COMPLETED] HYDROmorphone  1 mg Intravenous Once  . lisinopril  20 mg Oral Daily  . LORazepam  0-4 mg Intravenous Q6H   Followed by  . LORazepam  0-4 mg Intravenous Q12H  . multivitamin with minerals  1 tablet Oral Daily  . [COMPLETED] sodium chloride      . thiamine  100 mg Oral Daily   Or  . thiamine  100 mg Intravenous Daily  . [DISCONTINUED] sodium chloride   Intravenous STAT   Continuous Infusions:   . 0.9 % NaCl with KCl 20 mEq / L 125 mL/hr at 11/06/12 1022  . [DISCONTINUED] sodium chloride 125 mL/hr at 11/05/12 0939   PRN Meds:.acetaminophen, acetaminophen, hydrALAZINE, HYDROmorphone (DILAUDID) injection, [COMPLETED] iohexol, LORazepam, LORazepam, ondansetron (ZOFRAN) IV, ondansetron, [DISCONTINUED]  HYDROmorphone (DILAUDID) injection, [DISCONTINUED] ondansetron (ZOFRAN) IV  Assessment/Plan:  Principal Problem:  *Pancreatitis, alcoholic, acute Active Problems:  HTN (hypertension)  Cocaine abuse  Alcohol abuse  Tobacco abuse  1. Acute on chronic alcoholic pancreatitis. Patient has been extensively counseled regarding the importance of abstinence from alcohol. CT of the abdomen confirms pancreatitis without any other significant pathology. He'll receive supportive care. Since he has continued GI discomfort, we will not advance his diet today.  Lipase  is improved today. He'll receive IV fluids and pain medications. 2. Hypertension. Likely uncontrolled since patient is unable to tolerate his meds. Likely exacerbated by cocaine abuse. Will avoid beta blockers with ongoing cocaine abuse. Increase lisinopril, add scheduled hydralazine and use prn clonidine.  3. Alcohol abuse. Patient was placed on alcohol withdrawal protocol with Ativan. No signs of withdrawal at present 4. Cocaine abuse. Patient was counseled regarding the importance of  quitting his ongoing drug abuse.  Time spent coordinating care:   LOS: 1 day   MEMON,JEHANZEB [facility] Pager: [phone] 11/06/2012, 10:55 AM

## 2012-11-06 NOTE — Plan of Care (Signed)
Problem: Phase I Progression Outcomes Goal: Voiding-avoid urinary catheter unless indicated Outcome: Completed/Met Date Met:  11/06/12 Ambulating to bathroom independently.

## 2012-11-06 NOTE — Plan of Care (Signed)
Problem: Phase I Progression Outcomes Goal: Initial discharge plan identified Outcome: Completed/Met Date Met:  11/06/12 Return home at discharge.

## 2012-11-07 NOTE — Progress Notes (Signed)
[facility]             Progress Note   Subjective: Patient trying to keep down clear liquids, but reports that he is having increasing nausea and abdominal pain after having liquids  Objective: Vital signs in last 24 hours: Temp:  [98 F (36.7 C)-98.4 F (36.9 C)] 98.1 F (36.7 C) (11/09 1000) Pulse Rate:  [87-102] 87  (11/09 1000) Resp:  [18-20] 18  (11/09 1000) BP: (146-216)/(100-151) 148/105 mmHg (11/09 1000) SpO2:  [95 %-100 %] 98 % (11/09 1000) Weight change:  Last BM Date: 11/05/12  Intake/Output from previous day: 11/08 0701 - 11/09 0700 In: 3355.4 [P.O.:720; I.V.:2635.4] Out: -  Total I/O In: 240 [P.O.:240] Out: -    Physical Exam: General: Alert, awake, oriented x3, in no acute distress. HEENT: No bruits, no goiter. Heart: Regular rate and rhythm, without murmurs, rubs, gallops. Lungs: Clear to auscultation bilaterally. Abdomen: Soft, nontender, nondistended, positive bowel sounds. Extremities: No clubbing cyanosis or edema with positive pedal pulses. Neuro: Grossly intact, nonfocal.    Lab Results: Basic Metabolic Panel:  Basename 11/06/12 0540 11/05/12 0913  NA 133* 145  K 4.1 4.2  CL 96 105  CO2 26 26  GLUCOSE 105* 101*  BUN 7 11  CREATININE 0.71 0.91  CALCIUM 9.6 9.3  MG -- --  PHOS -- --   Liver Function Tests:  [facility] 11/05/12 0913  AST 28  ALT 15  ALKPHOS 67  BILITOT 0.6  PROT 8.0  ALBUMIN 4.1    Basename 11/06/12 0540 11/05/12 0913  LIPASE 404* 862*  AMYLASE -- --   No results found for this basename: AMMONIA:2 in the last 72 hours CBC:  Basename 11/06/12 0540 11/05/12 0913  WBC 5.9 7.3  NEUTROABS -- 5.4  HGB 15.3 15.6  HCT 43.9 45.8  MCV 97.3 99.3  PLT 183 184   Cardiac Enzymes: No results found for this basename: CKTOTAL:3,CKMB:3,CKMBINDEX:3,TROPONINI:3 in the last 72 hours BNP: No results found for this basename: PROBNP:3 in the last 72 hours D-Dimer: No results found for this basename:  DDIMER:2 in the last 72 hours CBG: No results found for this basename: GLUCAP:6 in the last 72 hours Hemoglobin A1C: No results found for this basename: HGBA1C in the last 72 hours Fasting Lipid Panel: No results found for this basename: CHOL,HDL,LDLCALC,TRIG,CHOLHDL,LDLDIRECT in the last 72 hours Thyroid Function Tests: No results found for this basename: TSH,T4TOTAL,FREET4,T3FREE,THYROIDAB in the last 72 hours Anemia Panel: No results found for this basename: VITAMINB12,FOLATE,FERRITIN,TIBC,IRON,RETICCTPCT in the last 72 hours Coagulation: No results found for this basename: LABPROT:2,INR:2 in the last 72 hours Urine Drug Screen: Drugs of Abuse     Component Value Date/Time   LABOPIA NONE DETECTED 10/03/2012 1542   COCAINSCRNUR POSITIVE* 10/03/2012 1542   LABBENZ NONE DETECTED 10/03/2012 1542   AMPHETMU NONE DETECTED 10/03/2012 1542   THCU NONE DETECTED 10/03/2012 1542   LABBARB NONE DETECTED 10/03/2012 1542    Alcohol Level: No results found for this basename: ETH:2 in the last 72 hours Urinalysis: No results found for this basename: COLORURINE:2,APPERANCEUR:2,LABSPEC:2,PHURINE:2,GLUCOSEU:2,HGBUR:2,BILIRUBINUR:2,KETONESUR:2,PROTEINUR:2,UROBILINOGEN:2,NITRITE:2,LEUKOCYTESUR:2 in the last 72 hours  No results found for this or any previous visit (from the past 240 hour(s)).  Studies/Results: Ct Abdomen Pelvis W Contrast  11/05/2012  *RADIOLOGY REPORT*  Clinical Data: Right lower quadrant abdominal pain.  Nausea and vomiting.  Prior history of pancreatitis.  CT ABDOMEN AND PELVIS WITH CONTRAST  Technique:  Multidetector CT imaging of the abdomen and pelvis was performed following the standard protocol during  bolus administration of intravenous contrast.  Contrast: OMNIPAQUE IOHEXOL 300 MG/ML. Oral contrast was also administered.  Comparison: CT abdomen and pelvis 10/06/2012, 07/25/2011, 05/06/2009.  Findings: Calcifications within the head of the pancreas as noted previously.  Edema  surrounding the head of the pancreas, with interval resolution of the edema involving the body and tail of the pancreas since the examination 1 month ago.  Wall thickening involving the medial wall of the descending duodenum.  No evidence of pseudocyst.  Entire pancreas enhances normally.  Diffuse hepatic steatosis without focal hepatic parenchymal abnormality.  Normal appearing spleen, adrenal glands, and left kidney.  Non-obstructing approximate 4 x 5 mm calculus in a mid calix of the right kidney as noted previously; right kidney otherwise unremarkable.  Gallbladder unremarkable by CT.  No biliary ductal dilation.  Mild bilateral internal iliac artery atherosclerosis.  No significant lymphadenopathy.  Stomach distended with oral contrast material.  Small hiatal hernia.  Apart from the duodenum, the remainder of the small bowel is normal in appearance.  Apparent wall thickening involving the distal descending and sigmoid colon is due to the fact that these segments are decompressed.  Colon unremarkable.  Normal appendix in the right upper pelvis.  No ascites.  Urinary bladder unremarkable.  Prostate gland upper normal in size. Numerous pelvic phleboliths.  Seminal vesicles.  Visualized lung bases clear.  Bone window images straight bilateral L5 pars defects with grade 1 spondylolisthesis of L5 on S1 approximating 6 mm, degenerative disc disease and disc protrusion at L5-S1.  IMPRESSION:  1.  Residual or recurrent pancreatitis involving the head of the pancreas.  No evidence of pseudocyst or other complicating features.  Calcifications in the head of the pancreas are consistent with chronic calcific pancreatitis. 2.  Secondary inflammation of the medial wall of the duodenum without obstruction.  3.  Diffuse hepatic steatosis without focal hepatic parenchymal abnormality. 4.  Non-obstructing 4 x 5 mm calculus in a mid calix of the right kidney. 5.  Small hiatal hernia.   Original Report Authenticated By: Hulan Saas, M.D.     Medications: Scheduled Meds:    . amLODipine  10 mg Oral Daily  . enoxaparin (LOVENOX) injection  40 mg Subcutaneous Q24H  . folic acid  1 mg Oral Daily  . hydrALAZINE  50 mg Oral Q8H  . lisinopril  40 mg Oral Daily  . LORazepam  0-4 mg Intravenous Q6H   Followed by  . LORazepam  0-4 mg Intravenous Q12H  . multivitamin with minerals  1 tablet Oral Daily  . thiamine  100 mg Oral Daily   Or  . thiamine  100 mg Intravenous Daily   Continuous Infusions:    . 0.9 % NaCl with KCl 20 mEq / L 125 mL/hr at 11/07/12 0407   PRN Meds:.acetaminophen, acetaminophen, cloNIDine, hydrALAZINE, HYDROmorphone (DILAUDID) injection, LORazepam, LORazepam, ondansetron (ZOFRAN) IV, ondansetron  Assessment/Plan:  Principal Problem:  *Pancreatitis, alcoholic, acute Active Problems:  HTN (hypertension)  Cocaine abuse  Alcohol abuse  Tobacco abuse  1. Acute on chronic alcoholic pancreatitis. Still having significant discomfort with liquids.  I do not think we will be able to advance his diet today.  Will keep on clear liquids for now and continue current treatments. If he does not being to progress, may need to have GI see him.  2. Hypertension.Improving with current regimen.  Continue current treatments.  3. Alcohol abuse. Patient was placed on alcohol withdrawal protocol with Ativan. No signs of withdrawal at present 4. Cocaine  abuse. Patient was counseled regarding the importance of quitting his ongoing drug abuse.  Time spent coordinating care:   LOS: 2 days   Aundreya Souffrant [facility] Pager: [phone] 11/07/2012, 1:05 PM

## 2012-11-08 MED ORDER — HYDROMORPHONE HCL PF 1 MG/ML IJ SOLN
1.0000 mg | Freq: Once | INTRAMUSCULAR | Status: AC
  Administered 2012-11-08: 1 mg via INTRAVENOUS

## 2012-11-08 NOTE — Progress Notes (Signed)
[facility]             Progress Note   Subjective: Still having significant pain after trying to have liquids.  Objective: Vital signs in last 24 hours: Temp:  [98.7 F (37.1 C)-99 F (37.2 C)] 99 F (37.2 C) (11/10 1400) Pulse Rate:  [82-95] 95  (11/10 1400) Resp:  [18-20] 18  (11/10 1400) BP: (120-183)/(79-131) 161/121 mmHg (11/10 1400) SpO2:  [97 %-98 %] 97 % (11/10 1400) Weight change:  Last BM Date: 11/05/12  Intake/Output from previous day: 11/09 0701 - 11/10 0700 In: 3694.6 [P.O.:480; I.V.:3214.6] Out: -  Total I/O In: 1706.3 [P.O.:600; I.V.:1106.3] Out: -    Physical Exam: General: Alert, awake, oriented x3, in no acute distress. HEENT: No bruits, no goiter. Heart: Regular rate and rhythm, without murmurs, rubs, gallops. Lungs: Clear to auscultation bilaterally. Abdomen: Soft, tenderness in periumbilical area, nondistended, positive bowel sounds. Extremities: No clubbing cyanosis or edema with positive pedal pulses. Neuro: Grossly intact, nonfocal.    Lab Results: Basic Metabolic Panel:  Basename 11/06/12 0540  NA 133*  K 4.1  CL 96  CO2 26  GLUCOSE 105*  BUN 7  CREATININE 0.71  CALCIUM 9.6  MG --  PHOS --   Liver Function Tests: No results found for this basename: AST:2,ALT:2,ALKPHOS:2,BILITOT:2,PROT:2,ALBUMIN:2 in the last 72 hours  Basename 11/06/12 0540  LIPASE 404*  AMYLASE --   No results found for this basename: AMMONIA:2 in the last 72 hours CBC:  Basename 11/06/12 0540  WBC 5.9  NEUTROABS --  HGB 15.3  HCT 43.9  MCV 97.3  PLT 183   Cardiac Enzymes: No results found for this basename: CKTOTAL:3,CKMB:3,CKMBINDEX:3,TROPONINI:3 in the last 72 hours BNP: No results found for this basename: PROBNP:3 in the last 72 hours D-Dimer: No results found for this basename: DDIMER:2 in the last 72 hours CBG: No results found for this basename: GLUCAP:6 in the last 72 hours Hemoglobin A1C: No results found for this  basename: HGBA1C in the last 72 hours Fasting Lipid Panel: No results found for this basename: CHOL,HDL,LDLCALC,TRIG,CHOLHDL,LDLDIRECT in the last 72 hours Thyroid Function Tests: No results found for this basename: TSH,T4TOTAL,FREET4,T3FREE,THYROIDAB in the last 72 hours Anemia Panel: No results found for this basename: VITAMINB12,FOLATE,FERRITIN,TIBC,IRON,RETICCTPCT in the last 72 hours Coagulation: No results found for this basename: LABPROT:2,INR:2 in the last 72 hours Urine Drug Screen: Drugs of Abuse     Component Value Date/Time   LABOPIA NONE DETECTED 10/03/2012 1542   COCAINSCRNUR POSITIVE* 10/03/2012 1542   LABBENZ NONE DETECTED 10/03/2012 1542   AMPHETMU NONE DETECTED 10/03/2012 1542   THCU NONE DETECTED 10/03/2012 1542   LABBARB NONE DETECTED 10/03/2012 1542    Alcohol Level: No results found for this basename: ETH:2 in the last 72 hours Urinalysis: No results found for this basename: COLORURINE:2,APPERANCEUR:2,LABSPEC:2,PHURINE:2,GLUCOSEU:2,HGBUR:2,BILIRUBINUR:2,KETONESUR:2,PROTEINUR:2,UROBILINOGEN:2,NITRITE:2,LEUKOCYTESUR:2 in the last 72 hours  No results found for this or any previous visit (from the past 240 hour(s)).  Studies/Results: No results found.  Medications: Scheduled Meds:    . amLODipine  10 mg Oral Daily  . enoxaparin (LOVENOX) injection  40 mg Subcutaneous Q24H  . folic acid  1 mg Oral Daily  . hydrALAZINE  50 mg Oral Q8H  .  HYDROmorphone (DILAUDID) injection  1 mg Intravenous Once  . lisinopril  40 mg Oral Daily  . LORazepam  0-4 mg Intravenous Q12H  . multivitamin with minerals  1 tablet Oral Daily  . thiamine  100 mg Oral Daily   Or  . thiamine  100 mg Intravenous Daily   Continuous Infusions:    . 0.9 % NaCl with KCl 20 mEq / L 125 mL/hr at 11/08/12 1551   PRN Meds:.acetaminophen, acetaminophen, cloNIDine, hydrALAZINE, HYDROmorphone (DILAUDID) injection, [EXPIRED] LORazepam, [EXPIRED] LORazepam, ondansetron (ZOFRAN) IV,  ondansetron  Assessment/Plan:  Principal Problem:  *Pancreatitis, alcoholic, acute Active Problems:  HTN (hypertension)  Cocaine abuse  Alcohol abuse  Tobacco abuse  1. Acute on chronic alcoholic pancreatitis. Still having significant discomfort with liquids.  I do not think we will be able to advance his diet today.  Will keep on clear liquids for now and continue current treatments. We will request GI consult for assistance with management.  2. Hypertension.Improving with current regimen.  Continue current treatments.  3. Alcohol abuse. Patient was placed on alcohol withdrawal protocol with Ativan. No signs of withdrawal at present 4. Cocaine abuse. Patient was counseled regarding the importance of quitting his ongoing drug abuse.  Time spent coordinating care:   LOS: 3 days   [name],Paul [facility] Pager: [phone] 11/08/2012, 6:11 PM

## 2012-11-09 ENCOUNTER — Encounter (HOSPITAL_COMMUNITY): Payer: Self-pay | Admitting: Gastroenterology

## 2012-11-09 DIAGNOSIS — K859 Acute pancreatitis without necrosis or infection, unspecified: Secondary | ICD-10-CM

## 2012-11-09 DIAGNOSIS — F1010 Alcohol abuse, uncomplicated: Secondary | ICD-10-CM

## 2012-11-09 DIAGNOSIS — K861 Other chronic pancreatitis: Secondary | ICD-10-CM

## 2012-11-09 LAB — CBC
HCT: 39.4 % (ref 39.0–52.0)
MCH: 33.7 pg (ref 26.0–34.0)
MCV: 96.8 fL (ref 78.0–100.0)
Platelets: 163 10*3/uL (ref 150–400)
RBC: 4.07 MIL/uL — ABNORMAL LOW (ref 4.22–5.81)
WBC: 4.7 10*3/uL (ref 4.0–10.5)

## 2012-11-09 LAB — COMPREHENSIVE METABOLIC PANEL
ALT: 8 U/L (ref 0–53)
AST: 13 U/L (ref 0–37)
Albumin: 3.2 g/dL — ABNORMAL LOW (ref 3.5–5.2)
CO2: 25 mEq/L (ref 19–32)
Calcium: 9.4 mg/dL (ref 8.4–10.5)
Chloride: 99 mEq/L (ref 96–112)
GFR calc non Af Amer: >90 mL/min (ref >90)
Sodium: 134 mEq/L — ABNORMAL LOW (ref 135–145)
Total Bilirubin: 1.2 mg/dL (ref 0.3–1.2)

## 2012-11-09 MED ORDER — DIPHENHYDRAMINE HCL 50 MG/ML IJ SOLN
25.0000 mg | Freq: Four times a day (QID) | INTRAMUSCULAR | Status: DC | PRN
  Administered 2012-11-09: 25 mg via INTRAVENOUS
  Filled 2012-11-09: qty 1

## 2012-11-09 MED ORDER — INFLUENZA VIRUS VACC SPLIT PF IM SUSP
0.5000 mL | Freq: Once | INTRAMUSCULAR | Status: DC
  Filled 2012-11-09: qty 0.5

## 2012-11-09 MED ORDER — PANTOPRAZOLE SODIUM 40 MG PO TBEC
40.0000 mg | DELAYED_RELEASE_TABLET | Freq: Every day | ORAL | Status: DC
  Administered 2012-11-09 – 2012-11-10 (×2): 40 mg via ORAL
  Filled 2012-11-09 (×2): qty 1

## 2012-11-09 MED ORDER — SODIUM CHLORIDE 0.9 % IJ SOLN
INTRAMUSCULAR | Status: AC
  Administered 2012-11-09: 10 mL
  Filled 2012-11-09: qty 3

## 2012-11-09 NOTE — Care Management Note (Signed)
    Page 1 of 1   11/10/2012     12:06:01 PM   CARE MANAGEMENT NOTE 11/10/2012  Patient:  KENDRIX, ORMAN A   Account Number:  [id number]  Date Initiated:  11/09/2012  Documentation initiated by:  Rosemary Holms  Subjective/Objective Assessment:   Pt admitted from home with abd. pain/Panc. Slow progresion with advancing diet. Hope to DC tomorrow     Action/Plan:   Anticipated DC Date:  11/10/2012   Anticipated DC Plan:  HOME/SELF CARE      DC Planning Services  CM consult      Choice offered to / List presented to:             Status of service:  Completed, signed off Medicare Important Message given?   (If response is "NO", the following Medicare IM given date fields will be blank) Date Medicare IM given:   Date Additional Medicare IM given:    Discharge Disposition:  HOME/SELF CARE  Per UR Regulation:    If discussed at Long Length of Stay Meetings, dates discussed:   11/10/2012    Comments:  11/10/12 Rosemary Holms RN BSN CM To follow up at [facility] Dept. Information on DC instructions.  11/09/12 Rosemary Holms RN BSN CM

## 2012-11-09 NOTE — Progress Notes (Signed)
[facility]             Progress Note   Subjective: Patient's abdominal pain is improving today.  He tolerated liquids this morning without worsening of his pain or nausea  Objective: Vital signs in last 24 hours: Temp:  [98.8 F (37.1 C)-99.1 F (37.3 C)] 98.8 F (37.1 C) (11/11 0716) Pulse Rate:  [85-95] 86  (11/11 0716) Resp:  [18-20] 18  (11/11 0716) BP: (147-161)/(97-121) 147/98 mmHg (11/11 0716) SpO2:  [97 %-98 %] 98 % (11/11 0716) Weight change:  Last BM Date: 11/05/12  Intake/Output from previous day: 11/10 0701 - 11/11 0700 In: 3715 [P.O.:840; I.V.:2875] Out: -      Physical Exam: General: Alert, awake, oriented x3, in no acute distress. HEENT: No bruits, no goiter. Heart: Regular rate and rhythm, without murmurs, rubs, gallops. Lungs: Clear to auscultation bilaterally. Abdomen: Soft, tenderness in periumbilical area, nondistended, positive bowel sounds. Extremities: No clubbing cyanosis or edema with positive pedal pulses. Neuro: Grossly intact, nonfocal.    Lab Results: Basic Metabolic Panel:  Basename 11/09/12 0528  NA 134*  K 4.1  CL 99  CO2 25  GLUCOSE 97  BUN 4*  CREATININE 0.73  CALCIUM 9.4  MG --  PHOS --   Liver Function Tests:  [facility] 11/09/12 0528  AST 13  ALT 8  ALKPHOS 55  BILITOT 1.2  PROT 7.1  ALBUMIN 3.2*    Basename 11/09/12 0528  LIPASE 170*  AMYLASE --   No results found for this basename: AMMONIA:2 in the last 72 hours CBC:  Basename 11/09/12 0528  WBC 4.7  NEUTROABS --  HGB 13.7  HCT 39.4  MCV 96.8  PLT 163   Cardiac Enzymes: No results found for this basename: CKTOTAL:3,CKMB:3,CKMBINDEX:3,TROPONINI:3 in the last 72 hours BNP: No results found for this basename: PROBNP:3 in the last 72 hours D-Dimer: No results found for this basename: DDIMER:2 in the last 72 hours CBG: No results found for this basename: GLUCAP:6 in the last 72 hours Hemoglobin A1C: No results found for this  basename: HGBA1C in the last 72 hours Fasting Lipid Panel: No results found for this basename: CHOL,HDL,LDLCALC,TRIG,CHOLHDL,LDLDIRECT in the last 72 hours Thyroid Function Tests: No results found for this basename: TSH,T4TOTAL,FREET4,T3FREE,THYROIDAB in the last 72 hours Anemia Panel: No results found for this basename: VITAMINB12,FOLATE,FERRITIN,TIBC,IRON,RETICCTPCT in the last 72 hours Coagulation: No results found for this basename: LABPROT:2,INR:2 in the last 72 hours Urine Drug Screen: Drugs of Abuse     Component Value Date/Time   LABOPIA NONE DETECTED 10/03/2012 1542   COCAINSCRNUR POSITIVE* 10/03/2012 1542   LABBENZ NONE DETECTED 10/03/2012 1542   AMPHETMU NONE DETECTED 10/03/2012 1542   THCU NONE DETECTED 10/03/2012 1542   LABBARB NONE DETECTED 10/03/2012 1542    Alcohol Level: No results found for this basename: ETH:2 in the last 72 hours Urinalysis: No results found for this basename: COLORURINE:2,APPERANCEUR:2,LABSPEC:2,PHURINE:2,GLUCOSEU:2,HGBUR:2,BILIRUBINUR:2,KETONESUR:2,PROTEINUR:2,UROBILINOGEN:2,NITRITE:2,LEUKOCYTESUR:2 in the last 72 hours  No results found for this or any previous visit (from the past 240 hour(s)).  Studies/Results: No results found.  Medications: Scheduled Meds:    . amLODipine  10 mg Oral Daily  . enoxaparin (LOVENOX) injection  40 mg Subcutaneous Q24H  . folic acid  1 mg Oral Daily  . hydrALAZINE  50 mg Oral Q8H  . [COMPLETED]  HYDROmorphone (DILAUDID) injection  1 mg Intravenous Once  . influenza  inactive virus vaccine  0.5 mL Intramuscular Once  . lisinopril  40 mg Oral Daily  . LORazepam  0-4  mg Intravenous Q12H  . multivitamin with minerals  1 tablet Oral Daily  . pantoprazole  40 mg Oral Q1200  . thiamine  100 mg Oral Daily   Or  . thiamine  100 mg Intravenous Daily   Continuous Infusions:    . 0.9 % NaCl with KCl 20 mEq / L 125 mL/hr at 11/09/12 0935   PRN Meds:.acetaminophen, acetaminophen, cloNIDine, diphenhydrAMINE,  hydrALAZINE, HYDROmorphone (DILAUDID) injection, [EXPIRED] LORazepam, [EXPIRED] LORazepam, ondansetron (ZOFRAN) IV, ondansetron  Assessment/Plan:  Principal Problem:  *Pancreatitis, alcoholic, acute Active Problems:  HTN (hypertension)  Cocaine abuse  Alcohol abuse  Tobacco abuse  1. Acute on chronic alcoholic pancreatitis. Appears to be slowly improving. Appreciate GI assistance.  Agree with advancing diet to low fat today.  Will hold of on advancing diet until seen by GI attending and decision made to have EGD today or not. If EGD is not planned for today, we will advance his diet. 2. Hypertension.Improving with current regimen.  Continue current treatments.  3. Alcohol abuse. Patient was placed on alcohol withdrawal protocol with Ativan. No signs of withdrawal at present 4. Cocaine abuse. Patient was counseled regarding the importance of quitting his ongoing drug abuse. Social work consult 5. Dispo.  Will plan on discharge home once he is tolerating solid food.  Probably in am.  Time spent coordinating care:   LOS: 4 days   Biff Rutigliano [facility] Pager: [phone] 11/09/2012, 11:25 AM

## 2012-11-09 NOTE — Consult Note (Signed)
REVIEWED.  PT NEED EGD/?TCS BUT NEED PROPOFOL DUE TO POLYSUBSTANCE ABUSE. PT'S RECENT COCAINE USE PREVENTS ELECTIVE STUDY BEING PERFORMED AT THIS TIME. PT'S UDS NEEDS TO BE NEG FOR COCAINE.

## 2012-11-09 NOTE — Clinical Social Work Psychosocial (Signed)
Clinical Social Work Department BRIEF PSYCHOSOCIAL ASSESSMENT 11/09/2012  Patient:  Paul [name], Paul [name]     Account Number:  [id number]     Admit date:  11/05/2012  Clinical Social Worker:  Nancie Neas  Date/Time:  11/09/2012 04:20 PM  Referred by:  Physician  Date Referred:  11/09/2012 Referred for  Substance Abuse   Other Referral:   Interview type:  Patient Other interview type:    PSYCHOSOCIAL DATA Living Status:  FRIEND(S) Admitted from facility:   Level of care:   Primary support name:  [name] Primary support relationship to patient:  FRIEND Degree of support available:   ?    CURRENT CONCERNS Current Concerns  Substance Abuse   Other Concerns:    SOCIAL WORK ASSESSMENT / PLAN CSW met with pt at bedside following referral for substance abuse. Pt alert and oriented and reports he lives with a friend who is his best support. Pt admitted with pacreatitis which he states he has a history of as well. Pt addressed substance use and reports daily drinking. When asked about amount, pt states, "Whatever I can get my hands on." He drinks beer and liquor. CSW questioned further but pt states he has no idea how many beverages he consumes on a daily basis. Pt's friend that he lives with also drinks with pt. Pt admits to a 30 year drinking history and cocaine use on a regular basis as well, based on how much money he has at the time. He has been to inpatient treatment which he found helpful at the time, but now feels he can stop whenever he wants to. Pt agreed living with a friend who has the same drinking habits may make it difficult to stop but he said this is his only living option right now. CSW discussed treatment options with pt and he is not interested in outpatient treatment and is aware of AA meeting schedules in the area. CSW offered information on these but pt refused as he again insisted he would not need help, but if he did he knew where to go.   Assessment/plan status:   Referral to [facility] Other assessment/ plan:   Information/referral to community resources:   Pt refused.    PATIENT'S/FAMILY'S RESPONSE TO PLAN OF CARE: Pt pleasant, but not seeking to receive treatment at this time. He reports he has also been counseled against substance use by MD and understands related health issues. SBIRT not completed as pt would not give any idea of how many beverages he consumes daily. CSW signing off.        Derenda Fennel, [place] [phone]

## 2012-11-09 NOTE — Consult Note (Signed)
Referring Provider: Erick Blinks, MD Primary Care Physician:  No primary provider on file. Primary Gastroenterologist:  Roetta Sessions, MD  Reason for Consultation:  Pancreatitis  HPI: Paul [name] is a 46 y.o. male admitted with etoh pancreatitis. Patient actually admitted for same 10/03/12 to 10/05/12. Presented back to ED 10/06/12 with ongoing abdominal pain but was not readmitted at that time. Patient states he did return to his baseline without any abdominal pain until 11/05/12 day of this admission.   History of alcoholic pancreatitis, first seen in July 2012 by [place]. Previous workup included abdominal ultrasound which was negative for gallstones in July 2012. He is also normal triglycerides at that time. He has documented chronic pancreatitis based on 09/2012 and current CT findings. There were plans for outpatient EGD but patient "no showed" to office twice and the EGD never was done.   Symptoms this time started the morning of 11/05/12. Woke up with severe epigastric pain and N/V. No vomiting since admission. Because of persistent PP abdominal pain with clear liquids we were consulted. 25% better since admission. Waking up with pain. Last BM day of admission. No melena, brbpr. Usually regular. No weight loss. No heartburn. No dysphagia. No hematuria, dysuria. Not taking medications due to being homeless and lack of access. Used to go to [facility], but released due to cocaine use. Cocaine use 1-2 times per week. Etoh use daily, "as much as I can get". Been to drug/etoh inpatient rehab but sobriety only for few months. No NSAIDs/ASA.   On admission his lipase was 862. Lipase down to 170 today.  LFTs unremarkable. CT as outlined below. He feels hungry.     Prior to Admission medications   Medication Sig Start Date End Date Taking? Authorizing Provider  amLODipine (NORVASC) 10 MG tablet Take 1 tablet (10 mg total) by mouth daily. 10/05/12   Nimish Normajean Glasgow, MD  lisinopril (PRINIVIL,ZESTRIL)  20 MG tablet Take 1 tablet (20 mg total) by mouth daily. 10/05/12   Nimish Normajean Glasgow, MD  ondansetron (ZOFRAN) 4 MG tablet Take 1 tablet (4 mg total) by mouth every 6 (six) hours. 10/06/12   Glynn Octave, MD  oxyCODONE-acetaminophen (PERCOCET/ROXICET) 5-325 MG per tablet Take 2 tablets by mouth every 4 (four) hours as needed for pain. 10/06/12   Glynn Octave, MD    Current Facility-Administered Medications  Medication Dose Route Frequency Provider Last Rate Last Dose  . 0.9 % NaCl with KCl 20 mEq/ L  infusion   Intravenous Continuous Erick Blinks, MD 125 mL/hr at 11/09/12 0935    . acetaminophen (TYLENOL) tablet 650 mg  650 mg Oral Q6H PRN Erick Blinks, MD       Or  . acetaminophen (TYLENOL) suppository 650 mg  650 mg Rectal Q6H PRN Erick Blinks, MD      . amLODipine (NORVASC) tablet 10 mg  10 mg Oral Daily Erick Blinks, MD   10 mg at 11/09/12 0911  . cloNIDine (CATAPRES) tablet 0.2 mg  0.2 mg Oral TID PRN Erick Blinks, MD   0.2 mg at 11/08/12 1554  . diphenhydrAMINE (BENADRYL) injection 25 mg  25 mg Intravenous Q6H PRN Osvaldo Shipper, MD   25 mg at 11/09/12 0408  . enoxaparin (LOVENOX) injection 40 mg  40 mg Subcutaneous Q24H Erick Blinks, MD   40 mg at 11/08/12 1753  . folic acid (FOLVITE) tablet 1 mg  1 mg Oral Daily Erick Blinks, MD   1 mg at 11/09/12 0911  . hydrALAZINE (APRESOLINE) injection  10 mg  10 mg Intravenous Q6H PRN Erick Blinks, MD   10 mg at 11/06/12 1517  . hydrALAZINE (APRESOLINE) tablet 50 mg  50 mg Oral Q8H Erick Blinks, MD   50 mg at 11/09/12 0626  . HYDROmorphone (DILAUDID) injection 1 mg  1 mg Intravenous Q4H PRN Erick Blinks, MD   1 mg at 11/09/12 0626  . [COMPLETED] HYDROmorphone (DILAUDID) injection 1 mg  1 mg Intravenous Once Erick Blinks, MD   1 mg at 11/08/12 1816  . lisinopril (PRINIVIL,ZESTRIL) tablet 40 mg  40 mg Oral Daily Erick Blinks, MD   40 mg at 11/09/12 0911  . LORazepam (ATIVAN) injection 0-4 mg  0-4 mg Intravenous Q12H Erick Blinks, MD      . [EXPIRED] LORazepam (ATIVAN) tablet 1 mg  1 mg Oral Q6H PRN Erick Blinks, MD   1 mg at 11/08/12 0506   Or  . [EXPIRED] LORazepam (ATIVAN) injection 1 mg  1 mg Intravenous Q6H PRN Erick Blinks, MD      . multivitamin with minerals tablet 1 tablet  1 tablet Oral Daily Erick Blinks, MD   1 tablet at 11/09/12 0911  . ondansetron (ZOFRAN) tablet 4 mg  4 mg Oral Q6H PRN Erick Blinks, MD       Or  . ondansetron (ZOFRAN) injection 4 mg  4 mg Intravenous Q6H PRN Erick Blinks, MD   4 mg at 11/08/12 1138  . pantoprazole (PROTONIX) EC tablet 40 mg  40 mg Oral Q1200 Tiffany Kocher, PA   40 mg at 11/09/12 0933  . thiamine (VITAMIN B-1) tablet 100 mg  100 mg Oral Daily Erick Blinks, MD   100 mg at 11/09/12 5409   Or  . thiamine (B-1) injection 100 mg  100 mg Intravenous Daily Erick Blinks, MD        Allergies as of 11/05/2012  . (No Known Allergies)    Past Medical History  Diagnosis Date  . Pancreatitis, acute     First episode earlier in 2012, hospitalized again in 02/2012 and 09/2012  . Hypertension     noncompliance  . Hyperlipidemia     noncompliance  . Lung collapse     h/o  . ETOH abuse   . Cocaine abuse   . DTs (delirium tremens)     history of  . Nephrolithiasis     Past Surgical History  Procedure Date  . Mandible fracture surgery 2006  . Left axillary     surgery for deep laceration    Family History  Problem Relation Age of Onset  . Diabetes type II Mother   . Liver disease Neg Hx   . Colon cancer Neg Hx   . GI problems Neg Hx     History   Social History  . Marital Status: Single    Spouse Name: N/A    Number of Children: 0  . Years of Education: N/A   Occupational History  . unemployed    Social History Main Topics  . Smoking status: Current Every Day Smoker -- 1.0 packs/day    Types: Cigarettes  . Smokeless tobacco: Not on file  . Alcohol Use: Yes     Comment: Three 40oz per day  . Drug Use: 3 per week    Special:  Cocaine     Comment: Denies intranasal or IV drug use.   Marland Kitchen Sexually Active: Yes   Other Topics Concern  . Not on file   Social History Narrative  Homeless, years. Stays anywhere can, abandoned homes/friends/etc.     ROS:  General: Negative for anorexia, weight loss, fever, chills, fatigue, weakness. Eyes: Negative for vision changes.  ENT: Negative for hoarseness, difficulty swallowing , nasal congestion. CV: Negative for chest pain, angina, palpitations, dyspnea on exertion, peripheral edema.  Respiratory: Negative for dyspnea at rest, dyspnea on exertion, cough, sputum, wheezing.  GI: See history of present illness. GU:  Negative for dysuria, hematuria, urinary incontinence, urinary frequency, nocturnal urination.  MS: Negative for joint pain, low back pain.  Derm: Negative for rash or itching.  Neuro: Negative for weakness, abnormal sensation, seizure, frequent headaches, memory loss, confusion.  Psych: Negative for anxiety, depression, suicidal ideation, hallucinations.  Endo: Negative for unusual weight change.  Heme: Negative for bruising or bleeding. Allergy: Negative for rash or hives.       Physical Examination: Vital signs in last 24 hours: Temp:  [98.8 F (37.1 C)-99.1 F (37.3 C)] 98.8 F (37.1 C) (11/11 0716) Pulse Rate:  [85-95] 86  (11/11 0716) Resp:  [18-20] 18  (11/11 0716) BP: (147-161)/(97-121) 147/98 mmHg (11/11 0716) SpO2:  [97 %-98 %] 98 % (11/11 0716) Last BM Date: 11/05/12  General: Well-nourished, well-developed in no acute distress. Appears comfortable.  Head: Normocephalic, atraumatic.   Eyes: Conjunctiva pink, no icterus. Mouth: Oropharyngeal mucosa moist and pink , no lesions erythema or exudate. Neck: Supple without thyromegaly, masses, or lymphadenopathy.  Lungs: Clear to auscultation bilaterally.  Heart: Regular rate and rhythm, no murmurs rubs or gallops.  Abdomen: Bowel sounds are normal, mild to moderate epigastric tenderness,  nondistended, no hepatosplenomegaly or masses, no abdominal bruits or hernia , no rebound or guarding.   Rectal: not performed Extremities: No lower extremity edema, clubbing, deformity.  Neuro: Alert and oriented x 4 , grossly normal neurologically.  Skin: Warm and dry, no rash or jaundice.   Psych: Alert and cooperative, normal mood and affect.        Intake/Output from previous day: 11/10 0701 - 11/11 0700 In: 1946.3 [P.O.:840; I.V.:1106.3] Out: -  Intake/Output this shift:    Lab Results: CBC  Basename 11/09/12 0528  WBC 4.7  HGB 13.7  HCT 39.4  MCV 96.8  PLT 163   BMET  Basename 11/09/12 0528  NA 134*  K 4.1  CL 99  CO2 25  GLUCOSE 97  BUN 4*  CREATININE 0.73  CALCIUM 9.4   LFT  Basename 11/09/12 0528  BILITOT 1.2  BILIDIR --  IBILI --  ALKPHOS 55  AST 13  ALT 8  PROT 7.1  ALBUMIN 3.2*    Component     Latest Ref Rng 11/05/2012 11/06/2012 11/09/2012  Lipase     11 - 59 U/L 862 (H) 404 (H) 170 (H)      Imaging Studies: Ct Abdomen Pelvis W Contrast  11/05/2012  *RADIOLOGY REPORT*  Clinical Data: Right lower quadrant abdominal pain.  Nausea and vomiting.  Prior history of pancreatitis.  CT ABDOMEN AND PELVIS WITH CONTRAST  Technique:  Multidetector CT imaging of the abdomen and pelvis was performed following the standard protocol during bolus administration of intravenous contrast.  Contrast: OMNIPAQUE IOHEXOL 300 MG/ML. Oral contrast was also administered.  Comparison: CT abdomen and pelvis 10/06/2012, 07/25/2011, 05/06/2009.  Findings: Calcifications within the head of the pancreas as noted previously.  Edema surrounding the head of the pancreas, with interval resolution of the edema involving the body and tail of the pancreas since the examination 1 month ago.  Wall thickening  involving the medial wall of the descending duodenum.  No evidence of pseudocyst.  Entire pancreas enhances normally.  Diffuse hepatic steatosis without focal hepatic  parenchymal abnormality.  Normal appearing spleen, adrenal glands, and left kidney.  Non-obstructing approximate 4 x 5 mm calculus in a mid calix of the right kidney as noted previously; right kidney otherwise unremarkable.  Gallbladder unremarkable by CT.  No biliary ductal dilation.  Mild bilateral internal iliac artery atherosclerosis.  No significant lymphadenopathy.  Stomach distended with oral contrast material.  Small hiatal hernia.  Apart from the duodenum, the remainder of the small bowel is normal in appearance.  Apparent wall thickening involving the distal descending and sigmoid colon is due to the fact that these segments are decompressed.  Colon unremarkable.  Normal appendix in the right upper pelvis.  No ascites.  Urinary bladder unremarkable.  Prostate gland upper normal in size. Numerous pelvic phleboliths.  Seminal vesicles.  Visualized lung bases clear.  Bone window images straight bilateral L5 pars defects with grade 1 spondylolisthesis of L5 on S1 approximating 6 mm, degenerative disc disease and disc protrusion at L5-S1.  IMPRESSION:  1.  Residual or recurrent pancreatitis involving the head of the pancreas.  No evidence of pseudocyst or other complicating features.  Calcifications in the head of the pancreas are consistent with chronic calcific pancreatitis. 2.  Secondary inflammation of the medial wall of the duodenum without obstruction.  3.  Diffuse hepatic steatosis without focal hepatic parenchymal abnormality. 4.  Non-obstructing 4 x 5 mm calculus in a mid calix of the right kidney. 5.  Small hiatal hernia.   Original Report Authenticated By: Hulan Saas, M.D.   Pierre.Alas week]  Impression: 46 y/o male with recurrent versus residual pancreatitic in setting of chronic pancreatitis. W/U previously points to etoh induced pancreatitis. At this time no complicating features on CT. He feels better this morning with less discomfort on clear liquids. His lipase is trending downward. His plan  of care is complicated by noncompliance to medications. He states he is homeless, currently living with a friend but states that will end very soon. He admits to weekly cocaine use and daily etoh use. Previously released from [facility] due to his cocaine use. He would benefit from pancreatitic enzymes which we may can get from patient assistance through the drug company.   Plan: 1. Add PPI. 2. I will discuss with Dr. Darrick Penna today. Consider low-fat diet trial. It is not clear that he needs EGD at this point. Likely secondary inflammation of duodenum from pancreatitis.  3. He needs outpatient screening colonoscopy.  4. Administrator, [employer].   I would like to thank Dr. Kerry Hough for allowing [place] to take part in the care of this nice patient.    LOS: 4 days   Tana Coast  11/09/2012, 9:50 AM

## 2012-11-10 ENCOUNTER — Encounter (HOSPITAL_COMMUNITY): Payer: Self-pay | Admitting: Internal Medicine

## 2012-11-10 DIAGNOSIS — F1010 Alcohol abuse, uncomplicated: Secondary | ICD-10-CM

## 2012-11-10 DIAGNOSIS — K859 Acute pancreatitis without necrosis or infection, unspecified: Secondary | ICD-10-CM

## 2012-11-10 MED ORDER — AMLODIPINE BESYLATE 10 MG PO TABS: 10.0000 mg | ORAL_TABLET | Freq: Every day | ORAL | Status: DC

## 2012-11-10 MED ORDER — HYDRALAZINE HCL 50 MG PO TABS: 50.0000 mg | ORAL_TABLET | Freq: Two times a day (BID) | ORAL | Status: DC

## 2012-11-10 MED ORDER — LISINOPRIL 20 MG PO TABS: 20.0000 mg | ORAL_TABLET | Freq: Every day | ORAL | Status: DC

## 2012-11-10 MED ORDER — PANTOPRAZOLE SODIUM 40 MG PO TBEC: 40.0000 mg | DELAYED_RELEASE_TABLET | Freq: Every day | ORAL | Status: DC

## 2012-11-10 MED ORDER — THIAMINE HCL 100 MG PO TABS: 100.0000 mg | ORAL_TABLET | Freq: Every day | ORAL | Status: DC

## 2012-11-10 MED ORDER — SODIUM CHLORIDE 0.9 % IJ SOLN
INTRAMUSCULAR | Status: AC
  Administered 2012-11-10: 05:00:00
  Filled 2012-11-10: qty 3

## 2012-11-10 MED ORDER — ADULT MULTIVITAMIN W/MINERALS CH: 1.0000 | ORAL_TABLET | Freq: Every day | ORAL | Status: DC

## 2012-11-10 NOTE — Progress Notes (Signed)
Discharge instructions and prescriptions given, verbalized understanding, out ambulatory in stable condition.

## 2012-11-10 NOTE — Discharge Summary (Signed)
Physician Discharge Summary  Paul [name] ZOX:[id number] DOB: July 11, 1966 DOA: 11/05/2012  PCP: [facility] health Department  Admit date: 11/05/2012 Discharge date: 11/10/2012  Time spent: Greater than 30 minutes  Recommendations for Outpatient Follow-up:  1. The patient will followup at the [facility] Department on 11/18/2012. 2. The patient will followup with gastroenterologist Dr. Darrick Penna. Her office will call the patient with an appointment.  Discharge Diagnoses:  1. Acute alcoholic pancreatitis. 2. Alcohol abuse. 3. Tobacco abuse. 4. Cocaine abuse. 5. Malignant hypertension. 6. Likely noncompliance.    Discharge Condition: Improved and stable.  Diet recommendation: Heart healthy.  Filed Weights   11/05/12 0904  Weight: 77.111 kg (170 lb)    History of present illness:  The patient is a 46 year old man with a history of alcohol abuse, cocaine abuse, and recurrent alcoholic pancreatitis, who presented to the emergency department on 11/05/2012 with a chief complaint of abdominal pain. In the emergency department, he was hypertensive with a blood pressure from 189-200 systolically. His lab data were significant for lipase of 862, normal liver transaminases, and normal WBC. He was admitted for further evaluation and management.  Hospital Course:  The patient was started on IV fluids for hydration. He was made to be n.p.o. IV opiate analgesics were ordered as needed for pain. Zofran intravenously was ordered as needed for nausea. The Ativan alcohol withdrawal protocol was initiated. He was restarted on antihypertensive medications orally and as needed IV hydralazine or clonidine. He admittedly had been noncompliant with taking his blood pressure medications. He was counseled on the importance of alcohol and cocaine cessation. He was also encouraged to stop smoking cigarettes. For further evaluation, a number of studies were ordered. The CT scan of his abdomen  and pelvis revealed residual or recurrent pancreatitis involving the head of the pancreas, but no evidence of pseudocyst or other complicating features; diffuse hepatic steatosis; and a nonobstructing calculus in the right kidney. His followup lipase improved to 404.  The patient's pain subsided. Clear liquid diet was started, but it was eventually discontinued when he began to have worsening abdominal pain after drinking clear liquids. Gastroenterologist, Dr. Jonette [name] was consulted. Following her assessment, proton pump inhibitor therapy was started with Protonix. She believed that the patient needed an EGD and questionably needed a total colonoscopy at some point, but he would require propofol due to his polysubstance abuse. The patient's current cocaine use prevented an elective study being performed during the hospitalization. She advised him to followup with her in the outpatient setting for further evaluation.  Eventually, the patient's pain subsided and didn't completely resolve. His diet was advanced. He tolerated the advancement well.  The clinical social worker was consulted to provide the patient with resources that would help with stopping polysubstance abuse. However, the patient was not interested in seeking treatment at the time of her conversation with him. According to him, he had been counseled many times before. He was advised and encouraged by the dictating physician to seek assistance from Alcoholics Anonymous and Narcotics Anonymous. There were no signs of alcohol withdrawal syndrome during hospitalization.  At the time of discharge, he was encouraged to followup at the [facility]. He was encouraged to be more compliant with antihypertensive medication therapy to avoid life-threatening heart attack or stroke. He voiced understanding.  Procedures:  Consultations:  Gastroenterologist, Paul [name], M.D.  Discharge Exam: Filed Vitals:   11/09/12 2305  11/10/12 0511 11/10/12 1121 11/10/12 1241  BP: 146/90 158/100 163/103 159/99  Pulse: 78 77 88 98  Temp:  98.1 F (36.7 C)  97 F (36.1 C)  TempSrc:  Oral  Oral  Resp: 19 18  18   Height:      Weight:      SpO2:  99%  98%    General: 46 year old African-American man sitting up in bed, in no acute distress. Cardiovascular: S1, S2, with no murmurs rubs or gallops. Respiratory: Clear to auscultation bilaterally. Neurologic/psychiatric: He is alert and oriented x3. Cranial nerves II through XII are intact. No signs of tremulousness.  Discharge Instructions  Discharge Orders    Future Orders Please Complete By Expires   Diet - low sodium heart healthy      Increase activity slowly      Discharge instructions      Comments:   Do not drink alcohol or use cocaine. Followup with Alcoholics Anonymous for support and/or Narcotics Anonymous for support. Take medications as prescribed.       Medication List     As of 11/10/2012  6:47 PM    STOP taking these medications         ondansetron 4 MG tablet   Commonly known as: ZOFRAN      oxyCODONE-acetaminophen 5-325 MG per tablet   Commonly known as: PERCOCET/ROXICET      TAKE these medications         amLODipine 10 MG tablet   Commonly known as: NORVASC   Take 1 tablet (10 mg total) by mouth daily.      hydrALAZINE 50 MG tablet   Commonly known as: APRESOLINE   Take 1 tablet (50 mg total) by mouth every 12 (twelve) hours. Another medication to treat her high blood pressure.      lisinopril 20 MG tablet   Commonly known as: PRINIVIL,ZESTRIL   Take 1 tablet (20 mg total) by mouth daily.      multivitamin with minerals Tabs   Take 1 tablet by mouth daily.      pantoprazole 40 MG tablet   Commonly known as: PROTONIX   Take 1 tablet (40 mg total) by mouth daily at 12 noon. Medication to treat acid reflux and to help to prevent stomach ulcers.      thiamine 100 MG tablet   Take 1 tablet (100 mg total) by mouth daily.             Follow-up Information    Follow up with [facility] Department. Schedule an appointment as soon as possible for a visit on 11/18/2012. (Your appointment is at 1:15pm-Bring proof of income when you go for your appointment.)    Contact information:   [address] [place] [address] [phone]       Follow up with Paul Eva, MD. (Gastroenterologist. Dr. Darrick Penna' office will call you with the appointment.)    Contact information:   [address] [place] [address] [phone]           The results of significant diagnostics from this hospitalization (including imaging, microbiology, ancillary and laboratory) are listed below for reference.    Significant Diagnostic Studies: Ct Abdomen Pelvis W Contrast  11/05/2012  *RADIOLOGY REPORT*  Clinical Data: Right lower quadrant abdominal pain.  Nausea and vomiting.  Prior history of pancreatitis.  CT ABDOMEN AND PELVIS WITH CONTRAST  Technique:  Multidetector CT imaging of the abdomen and pelvis was performed following the standard protocol during bolus administration of intravenous contrast.  Contrast:  OMNIPAQUE IOHEXOL 300 MG/ML. Oral contrast was also administered.  Comparison: CT abdomen and pelvis 10/06/2012, 07/25/2011, 05/06/2009.  Findings: Calcifications within the head of the pancreas as noted previously.  Edema surrounding the head of the pancreas, with interval resolution of the edema involving the body and tail of the pancreas since the examination 1 month ago.  Wall thickening involving the medial wall of the descending duodenum.  No evidence of pseudocyst.  Entire pancreas enhances normally.  Diffuse hepatic steatosis without focal hepatic parenchymal abnormality.  Normal appearing spleen, adrenal glands, and left kidney.  Non-obstructing approximate 4 x 5 mm calculus in a mid calix of the right kidney as noted previously; right kidney otherwise unremarkable.  Gallbladder  unremarkable by CT.  No biliary ductal dilation.  Mild bilateral internal iliac artery atherosclerosis.  No significant lymphadenopathy.  Stomach distended with oral contrast material.  Small hiatal hernia.  Apart from the duodenum, the remainder of the small bowel is normal in appearance.  Apparent wall thickening involving the distal descending and sigmoid colon is due to the fact that these segments are decompressed.  Colon unremarkable.  Normal appendix in the right upper pelvis.  No ascites.  Urinary bladder unremarkable.  Prostate gland upper normal in size. Numerous pelvic phleboliths.  Seminal vesicles.  Visualized lung bases clear.  Bone window images straight bilateral L5 pars defects with grade 1 spondylolisthesis of L5 on S1 approximating 6 mm, degenerative disc disease and disc protrusion at L5-S1.  IMPRESSION:  1.  Residual or recurrent pancreatitis involving the head of the pancreas.  No evidence of pseudocyst or other complicating features.  Calcifications in the head of the pancreas are consistent with chronic calcific pancreatitis. 2.  Secondary inflammation of the medial wall of the duodenum without obstruction.  3.  Diffuse hepatic steatosis without focal hepatic parenchymal abnormality. 4.  Non-obstructing 4 x 5 mm calculus in a mid calix of the right kidney. 5.  Small hiatal hernia.   Original Report Authenticated By: Hulan Saas, M.D.     Microbiology: No results found for this or any previous visit (from the past 240 hour(s)).   Labs: Basic Metabolic Panel:  Lab 11/09/12 1610 11/06/12 0540 11/05/12 0913  NA 134* 133* 145  K 4.1 4.1 4.2  CL 99 96 105  CO2 25 26 26   GLUCOSE 97 105* 101*  BUN 4* 7 11  CREATININE 0.73 0.71 0.91  CALCIUM 9.4 9.6 9.3  MG -- -- --  PHOS -- -- --   Liver Function Tests:  Lab 11/09/12 0528 11/05/12 0913  AST 13 28  ALT 8 15  ALKPHOS 55 67  BILITOT 1.2 0.6  PROT 7.1 8.0  ALBUMIN 3.2* 4.1    Lab 11/09/12 0528 11/06/12 0540 11/05/12  0913  LIPASE 170* 404* 862*  AMYLASE -- -- --   No results found for this basename: AMMONIA:5 in the last 168 hours CBC:  Lab 11/09/12 0528 11/06/12 0540 11/05/12 0913  WBC 4.7 5.9 7.3  NEUTROABS -- -- 5.4  HGB 13.7 15.3 15.6  HCT 39.4 43.9 45.8  MCV 96.8 97.3 99.3  PLT 163 183 184   Cardiac Enzymes: No results found for this basename: CKTOTAL:5,CKMB:5,CKMBINDEX:5,TROPONINI:5 in the last 168 hours BNP: BNP (last 3 results) No results found for this basename: PROBNP:3 in the last 8760 hours CBG: No results found for this basename: GLUCAP:5 in the last 168 hours     Signed:  Keisy Strickler  [facility] 11/10/2012, 6:47 PM

## 2012-11-10 NOTE — Progress Notes (Signed)
REVIEWED. AGREE. 

## 2012-11-10 NOTE — Progress Notes (Signed)
Subjective: Denies N/V, abdominal pain. Tolerating diet.   Objective: Vital signs in last 24 hours: Temp:  [97.9 F (36.6 C)-98.5 F (36.9 C)] 98.1 F (36.7 C) (11/12 0511) Pulse Rate:  [77-84] 77  (11/12 0511) Resp:  [18-19] 18  (11/12 0511) BP: (144-160)/(90-105) 158/100 mmHg (11/12 0511) SpO2:  [98 %-99 %] 99 % (11/12 0511) Last BM Date: 11/05/12 General:   Alert and oriented, pleasant Head:  Normocephalic and atraumatic. Eyes:  No icterus, sclera clear. Conjuctiva pink.  Heart:  S1, S2 present, no murmurs noted.  Lungs: Clear to auscultation bilaterally, without wheezing, rales, or rhonchi.  Abdomen:  Bowel sounds present, soft, non-tender, non-distended. No HSM or hernias noted. No rebound or guarding. No masses appreciated  Msk:  Symmetrical without gross deformities. Normal posture. Extremities:  Without clubbing or edema. Neurologic:  Alert and  oriented x4;  grossly normal neurologically. Skin:  Warm and dry, intact without significant lesions.  Psych:  Alert and cooperative. Normal mood and affect.  Intake/Output from previous day: 11/11 0701 - 11/12 0700 In: 6718 [P.O.:960; I.V.:5758] Out: -  Intake/Output this shift:    Lab Results:  [facility] 11/09/12 0528  WBC 4.7  HGB 13.7  HCT 39.4  PLT 163   BMET  Basename 11/09/12 0528  NA 134*  K 4.1  CL 99  CO2 25  GLUCOSE 97  BUN 4*  CREATININE 0.73  CALCIUM 9.4   LFT  Basename 11/09/12 0528  PROT 7.1  ALBUMIN 3.2*  AST 13  ALT 8  ALKPHOS 55  BILITOT 1.2  BILIDIR --  IBILI --     Assessment: 46 year old male with acute on chronic pancreatitis secondary to ETOH. No lipase drawn today; however, pt is clinically improving. Hx of cocaine and daily ETOH use, homeless. CT with chronic calcific pancreatitis, secondary inflammation of medial wall of duodenum without obstruction. Needs EGD for further evaluation, yet this is likely secondary to acute pancreatitis. Due to active drug use, would need  negative drug screen prior. Needs screening colonoscopy at time of EGD.    Plan: Anticipate d/c home today Continue PPI daily Add pancreatic enzymes  Follow-up as outpatient for consideration of EGD/TCS with Propofol, needs negative drug screen prior   LOS: 5 days   Gerrit Halls  11/10/2012, 9:20 AM

## 2012-12-17 NOTE — Progress Notes (Signed)
UR Chart Review Completed  

## 2012-12-28 ENCOUNTER — Encounter: Payer: Self-pay | Admitting: *Deleted

## 2013-04-24 ENCOUNTER — Emergency Department (HOSPITAL_COMMUNITY): Payer: Self-pay

## 2013-04-24 ENCOUNTER — Encounter (HOSPITAL_COMMUNITY): Payer: Self-pay

## 2013-04-24 ENCOUNTER — Emergency Department (HOSPITAL_COMMUNITY)
Admission: EM | Admit: 2013-04-24 | Discharge: 2013-04-24 | Disposition: A | Discharge status: Home / Self Care | Payer: Self-pay | Attending: Emergency Medicine | Admitting: Emergency Medicine

## 2013-04-24 DIAGNOSIS — K859 Acute pancreatitis without necrosis or infection, unspecified: Secondary | ICD-10-CM | POA: Insufficient documentation

## 2013-04-24 DIAGNOSIS — Z9119 Patient's noncompliance with other medical treatment and regimen: Secondary | ICD-10-CM | POA: Insufficient documentation

## 2013-04-24 DIAGNOSIS — R1013 Epigastric pain: Secondary | ICD-10-CM | POA: Insufficient documentation

## 2013-04-24 DIAGNOSIS — E785 Hyperlipidemia, unspecified: Secondary | ICD-10-CM | POA: Insufficient documentation

## 2013-04-24 DIAGNOSIS — Z87442 Personal history of urinary calculi: Secondary | ICD-10-CM | POA: Insufficient documentation

## 2013-04-24 DIAGNOSIS — Z79899 Other long term (current) drug therapy: Secondary | ICD-10-CM | POA: Insufficient documentation

## 2013-04-24 DIAGNOSIS — I10 Essential (primary) hypertension: Secondary | ICD-10-CM | POA: Insufficient documentation

## 2013-04-24 DIAGNOSIS — Z91199 Patient's noncompliance with other medical treatment and regimen due to unspecified reason: Secondary | ICD-10-CM | POA: Insufficient documentation

## 2013-04-24 DIAGNOSIS — Z8709 Personal history of other diseases of the respiratory system: Secondary | ICD-10-CM | POA: Insufficient documentation

## 2013-04-24 DIAGNOSIS — Z8669 Personal history of other diseases of the nervous system and sense organs: Secondary | ICD-10-CM | POA: Insufficient documentation

## 2013-04-24 DIAGNOSIS — R112 Nausea with vomiting, unspecified: Secondary | ICD-10-CM | POA: Insufficient documentation

## 2013-04-24 DIAGNOSIS — F17200 Nicotine dependence, unspecified, uncomplicated: Secondary | ICD-10-CM | POA: Insufficient documentation

## 2013-04-24 LAB — LIPASE, BLOOD: Lipase: 273 U/L — ABNORMAL HIGH (ref 11–59)

## 2013-04-24 MED ORDER — HYDROCODONE-ACETAMINOPHEN 5-325 MG PO TABS: 1.0000 | ORAL_TABLET | ORAL | Status: DC | PRN

## 2013-04-24 MED ORDER — MORPHINE SULFATE 2 MG/ML IJ SOLN
INTRAMUSCULAR | Status: AC
  Administered 2013-04-24: 2 mg via INTRAVENOUS
  Filled 2013-04-24: qty 1

## 2013-04-24 MED ORDER — ONDANSETRON HCL 4 MG/2ML IJ SOLN
4.0000 mg | Freq: Once | INTRAMUSCULAR | Status: AC
  Administered 2013-04-24: 4 mg via INTRAVENOUS

## 2013-04-24 MED ORDER — MORPHINE SULFATE 4 MG/ML IJ SOLN
INTRAMUSCULAR | Status: AC
  Administered 2013-04-24: 4 mg via INTRAVENOUS
  Filled 2013-04-24: qty 1

## 2013-04-24 MED ORDER — PANTOPRAZOLE SODIUM 40 MG IV SOLR
40.0000 mg | Freq: Once | INTRAVENOUS | Status: AC
  Administered 2013-04-24: 40 mg via INTRAVENOUS
  Filled 2013-04-24: qty 40

## 2013-04-24 MED ORDER — ONDANSETRON 4 MG PO TBDP: 4.0000 mg | ORAL_TABLET | Freq: Three times a day (TID) | ORAL | Status: DC | PRN

## 2013-04-24 MED ORDER — ONDANSETRON HCL 4 MG/2ML IJ SOLN
4.0000 mg | Freq: Once | INTRAMUSCULAR | Status: AC
  Administered 2013-04-24: 4 mg via INTRAVENOUS
  Filled 2013-04-24: qty 2

## 2013-04-24 MED ORDER — SODIUM CHLORIDE 0.9 % IV BOLUS (SEPSIS)
1000.0000 mL | Freq: Once | INTRAVENOUS | Status: AC
  Administered 2013-04-24: 1000 mL via INTRAVENOUS

## 2013-04-24 MED ORDER — ONDANSETRON HCL 4 MG/2ML IJ SOLN
INTRAMUSCULAR | Status: AC
  Administered 2013-04-24: 4 mg via INTRAVENOUS
  Filled 2013-04-24: qty 2

## 2013-04-24 MED ORDER — MORPHINE SULFATE 4 MG/ML IJ SOLN
2.0000 mg | Freq: Once | INTRAMUSCULAR | Status: AC
  Administered 2013-04-24: 2 mg via INTRAVENOUS
  Administered 2013-04-24: 4 mg via INTRAVENOUS
  Filled 2013-04-24: qty 1

## 2013-04-24 NOTE — ED Provider Notes (Signed)
History     CSN: [id number]  Arrival date & time 04/24/13  0217   First MD Initiated Contact with Patient 04/24/13 0250      Chief Complaint  Patient presents with  . Abdominal Pain  . Emesis    (Consider location/radiation/quality/duration/timing/severity/associated sxs/prior treatment) HPI Paul [name] is a 47 y.o. male who presents to the Emergency Department complaining of nausea, vomiting, abdominal pain c/w pancreatitis. He has been drinking this evening and developed pain to the epigastric area accompanied by nausea and vomiting. He has taken no medicines.  Past Medical History  Diagnosis Date  . Pancreatitis, acute     First episode earlier in 2012, hospitalized again in 02/2012 and 09/2012  . Hypertension     noncompliance  . Hyperlipidemia     noncompliance  . Lung collapse     h/o  . ETOH abuse   . Cocaine abuse   . DTs (delirium tremens)     history of  . Nephrolithiasis   . Tobacco abuse     Past Surgical History  Procedure Laterality Date  . Mandible fracture surgery  2006  . Left axillary      surgery for deep laceration    Family History  Problem Relation Age of Onset  . Diabetes type II Mother   . Liver disease Neg Hx   . Colon cancer Neg Hx   . GI problems Neg Hx     History  Substance Use Topics  . Smoking status: Current Every Day Smoker -- 1.00 packs/day    Types: Cigarettes  . Smokeless tobacco: Not on file  . Alcohol Use: Yes     Comment: Three 40oz per day      Review of Systems  Constitutional: Negative for fever.       10 Systems reviewed and are negative for acute change except as noted in the HPI.  HENT: Negative for congestion.   Eyes: Negative for discharge and redness.  Respiratory: Negative for cough and shortness of breath.   Cardiovascular: Negative for chest pain.  Gastrointestinal: Positive for nausea, vomiting and abdominal pain.       Epigastric pain with palpation.   Musculoskeletal: Negative for back  pain.  Skin: Negative for rash.  Neurological: Negative for syncope, numbness and headaches.  Psychiatric/Behavioral:       No behavior change.    Allergies  Review of patient's allergies indicates no known allergies.  Home Medications   Current Outpatient Rx  Name  Route  Sig  Dispense  Refill  . amLODipine (NORVASC) 10 MG tablet   Oral   Take 1 tablet (10 mg total) by mouth daily.   30 tablet   2   . hydrALAZINE (APRESOLINE) 50 MG tablet   Oral   Take 1 tablet (50 mg total) by mouth every 12 (twelve) hours. Another medication to treat her high blood pressure.   60 tablet   2   . lisinopril (PRINIVIL,ZESTRIL) 20 MG tablet   Oral   Take 1 tablet (20 mg total) by mouth daily.   30 tablet   2   . Multiple Vitamin (MULTIVITAMIN WITH MINERALS) TABS   Oral   Take 1 tablet by mouth daily.         . pantoprazole (PROTONIX) 40 MG tablet   Oral   Take 1 tablet (40 mg total) by mouth daily at 12 noon. Medication to treat acid reflux and to help to prevent stomach ulcers.   30 tablet  2   . thiamine 100 MG tablet   Oral   Take 1 tablet (100 mg total) by mouth daily.           BP 178/127  Pulse 81  Temp(Src) 97.5 F (36.4 C) (Oral)  Resp 20  Ht 6' (1.829 m)  Wt 170 lb (77.111 kg)  BMI 23.05 kg/m2  SpO2 98%  Physical Exam  Nursing note and vitals reviewed. Constitutional: He appears well-developed and well-nourished.  Awake, alert, nontoxic appearance.  HENT:  Head: Normocephalic and atraumatic.  Right Ear: External ear normal.  Left Ear: External ear normal.  Eyes: EOM are normal. Pupils are equal, round, and reactive to light. Right eye exhibits no discharge. Left eye exhibits no discharge.  Neck: Neck supple.  Cardiovascular: Normal rate and intact distal pulses.   Pulmonary/Chest: Effort normal and breath sounds normal. He exhibits no tenderness.  Abdominal: Soft. There is no tenderness. There is no rebound.  epgistric pain with palpation   Musculoskeletal: He exhibits no tenderness.  Baseline ROM, no obvious new focal weakness.  Neurological:  Mental status and motor strength appears baseline for patient and situation.  Skin: No rash noted.  Psychiatric: He has a normal mood and affect.    ED Course  Procedures (including critical care time) Results for orders placed during the hospital encounter of 04/24/13  LIPASE, BLOOD      Result Value Range   Lipase 273 (*) 11 - 59 U/L   Medications  sodium chloride 0.9 % bolus 1,000 mL (1,000 mLs Intravenous New Bag/Given 04/24/13 0253)  ondansetron (ZOFRAN) injection 4 mg (4 mg Intravenous Given 04/24/13 0253)  pantoprazole (PROTONIX) injection 40 mg (40 mg Intravenous Given 04/24/13 0258)  morphine 4 MG/ML injection 2 mg (4 mg Intravenous Given 04/24/13 0523)  morphine 2 MG/ML injection (2 mg Intravenous Given 04/24/13 0400)  ondansetron (ZOFRAN) injection 4 mg (4 mg Intravenous Given 04/24/13 0522)    MDM  Patient with h/o pancreatitis with h/o nausea and vomiting. Given IVF, zofran x 2, morphine x 3 with relief. He was able to take PO fluids. He will be discharged home with analgesic and antiemetic. Pt stable in ED with no significant deterioration in condition.The patient appears reasonably screened and/or stabilized for discharge and I doubt any other medical condition or other [facility] requiring further screening, evaluation, or treatment in the ED at this time prior to discharge.  MDM Reviewed: nursing note and vitals Interpretation: labs           Nicoletta Dress. Colon Branch, MD 04/24/13 [phone]

## 2013-04-24 NOTE — ED Notes (Signed)
Pt reports history of pancreatitis, states is having severe abd pain and vomiting for past several hours

## 2013-06-12 ENCOUNTER — Inpatient Hospital Stay (HOSPITAL_COMMUNITY)
Admission: EM | Admit: 2013-06-12 | Discharge: 2013-06-15 | DRG: 085 | Disposition: A | Discharge status: Home / Self Care | Payer: MEDICAID | Attending: General Surgery | Admitting: General Surgery

## 2013-06-12 ENCOUNTER — Encounter (HOSPITAL_COMMUNITY): Payer: Self-pay

## 2013-06-12 ENCOUNTER — Emergency Department (HOSPITAL_COMMUNITY): Payer: Self-pay

## 2013-06-12 DIAGNOSIS — S0633AA Contusion and laceration of cerebrum, unspecified, with loss of consciousness status unknown, initial encounter: Principal | ICD-10-CM | POA: Diagnosis present

## 2013-06-12 DIAGNOSIS — S065XAA Traumatic subdural hemorrhage with loss of consciousness status unknown, initial encounter: Secondary | ICD-10-CM

## 2013-06-12 DIAGNOSIS — I10 Essential (primary) hypertension: Secondary | ICD-10-CM | POA: Diagnosis present

## 2013-06-12 DIAGNOSIS — S065X9A Traumatic subdural hemorrhage with loss of consciousness of unspecified duration, initial encounter: Secondary | ICD-10-CM

## 2013-06-12 DIAGNOSIS — K859 Acute pancreatitis without necrosis or infection, unspecified: Secondary | ICD-10-CM

## 2013-06-12 DIAGNOSIS — S06330A Contusion and laceration of cerebrum, unspecified, without loss of consciousness, initial encounter: Principal | ICD-10-CM | POA: Diagnosis present

## 2013-06-12 DIAGNOSIS — S0280XB Fracture of other specified skull and facial bones, unspecified side, initial encounter for open fracture: Secondary | ICD-10-CM | POA: Diagnosis present

## 2013-06-12 DIAGNOSIS — S066X9A Traumatic subarachnoid hemorrhage with loss of consciousness of unspecified duration, initial encounter: Secondary | ICD-10-CM

## 2013-06-12 DIAGNOSIS — R569 Unspecified convulsions: Secondary | ICD-10-CM | POA: Diagnosis present

## 2013-06-12 DIAGNOSIS — S0100XA Unspecified open wound of scalp, initial encounter: Secondary | ICD-10-CM | POA: Diagnosis present

## 2013-06-12 DIAGNOSIS — N200 Calculus of kidney: Secondary | ICD-10-CM | POA: Diagnosis present

## 2013-06-12 DIAGNOSIS — IMO0002 Reserved for concepts with insufficient information to code with codable children: Secondary | ICD-10-CM | POA: Diagnosis present

## 2013-06-12 DIAGNOSIS — F1410 Cocaine abuse, uncomplicated: Secondary | ICD-10-CM | POA: Diagnosis present

## 2013-06-12 DIAGNOSIS — S02109B Fracture of base of skull, unspecified side, initial encounter for open fracture: Secondary | ICD-10-CM

## 2013-06-12 DIAGNOSIS — S0291XB Unspecified fracture of skull, initial encounter for open fracture: Secondary | ICD-10-CM

## 2013-06-12 DIAGNOSIS — E785 Hyperlipidemia, unspecified: Secondary | ICD-10-CM | POA: Diagnosis present

## 2013-06-12 DIAGNOSIS — S0291XA Unspecified fracture of skull, initial encounter for closed fracture: Secondary | ICD-10-CM

## 2013-06-12 DIAGNOSIS — F1020 Alcohol dependence, uncomplicated: Secondary | ICD-10-CM | POA: Diagnosis present

## 2013-06-12 DIAGNOSIS — S0101XA Laceration without foreign body of scalp, initial encounter: Secondary | ICD-10-CM

## 2013-06-12 DIAGNOSIS — Z9119 Patient's noncompliance with other medical treatment and regimen: Secondary | ICD-10-CM

## 2013-06-12 DIAGNOSIS — S0636AA Traumatic hemorrhage of cerebrum, unspecified, with loss of consciousness status unknown, initial encounter: Secondary | ICD-10-CM

## 2013-06-12 DIAGNOSIS — S06369A Traumatic hemorrhage of cerebrum, unspecified, with loss of consciousness of unspecified duration, initial encounter: Secondary | ICD-10-CM

## 2013-06-12 DIAGNOSIS — F17200 Nicotine dependence, unspecified, uncomplicated: Secondary | ICD-10-CM | POA: Diagnosis present

## 2013-06-12 DIAGNOSIS — Z91199 Patient's noncompliance with other medical treatment and regimen due to unspecified reason: Secondary | ICD-10-CM

## 2013-06-12 DIAGNOSIS — S06309A Unspecified focal traumatic brain injury with loss of consciousness of unspecified duration, initial encounter: Secondary | ICD-10-CM

## 2013-06-12 DIAGNOSIS — F10920 Alcohol use, unspecified with intoxication, uncomplicated: Secondary | ICD-10-CM

## 2013-06-12 LAB — COMPREHENSIVE METABOLIC PANEL
ALT: 21 U/L (ref 0–53)
AST: 33 U/L (ref 0–37)
Albumin: 3.7 g/dL (ref 3.5–5.2)
Alkaline Phosphatase: 60 U/L (ref 39–117)
CO2: 25 mEq/L (ref 19–32)
Chloride: 87 mEq/L — ABNORMAL LOW (ref 96–112)
Creatinine, Ser: 1.86 mg/dL — ABNORMAL HIGH (ref 0.50–1.35)
GFR calc non Af Amer: 42 mL/min — ABNORMAL LOW (ref >90)
Potassium: 3.6 mEq/L (ref 3.5–5.1)
Total Bilirubin: 0.6 mg/dL (ref 0.3–1.2)

## 2013-06-12 LAB — CBC WITH DIFFERENTIAL/PLATELET
Basophils Absolute: 0 10*3/uL (ref 0.0–0.1)
Eosinophils Relative: 1 % (ref 0–5)
HCT: 42.1 % (ref 39.0–52.0)
Lymphocytes Relative: 27 % (ref 12–46)
Lymphs Abs: 2.4 10*3/uL (ref 0.7–4.0)
MCV: 96.1 fL (ref 78.0–100.0)
Neutro Abs: 5.6 10*3/uL (ref 1.7–7.7)
Platelets: 203 10*3/uL (ref 150–400)
RBC: 4.38 MIL/uL (ref 4.22–5.81)
RDW: 14.4 % (ref 11.5–15.5)
WBC: 8.7 10*3/uL (ref 4.0–10.5)

## 2013-06-12 LAB — PROTIME-INR: Prothrombin Time: 13.3 seconds (ref 11.6–15.2)

## 2013-06-12 LAB — ETHANOL: Alcohol, Ethyl (B): 173 mg/dL — ABNORMAL HIGH (ref 0–11)

## 2013-06-12 MED ORDER — THIAMINE HCL 100 MG/ML IJ SOLN
100.0000 mg | Freq: Every day | INTRAMUSCULAR | Status: DC
  Administered 2013-06-12: 100 mg via INTRAVENOUS
  Filled 2013-06-12 (×3): qty 1

## 2013-06-12 MED ORDER — LORAZEPAM 2 MG/ML IJ SOLN
1.0000 mg | Freq: Four times a day (QID) | INTRAMUSCULAR | Status: DC | PRN
  Filled 2013-06-12 (×2): qty 1

## 2013-06-12 MED ORDER — HYDRALAZINE HCL 50 MG PO TABS
50.0000 mg | ORAL_TABLET | Freq: Two times a day (BID) | ORAL | Status: DC
  Administered 2013-06-13: 50 mg via ORAL
  Filled 2013-06-12 (×3): qty 1

## 2013-06-12 MED ORDER — ONDANSETRON HCL 4 MG/2ML IJ SOLN: 4.0000 mg | Freq: Four times a day (QID) | INTRAMUSCULAR | Status: DC | PRN

## 2013-06-12 MED ORDER — VITAMIN B-1 100 MG PO TABS
100.0000 mg | ORAL_TABLET | Freq: Every day | ORAL | Status: DC
  Administered 2013-06-13 – 2013-06-15 (×3): 100 mg via ORAL
  Filled 2013-06-12 (×4): qty 1

## 2013-06-12 MED ORDER — AMLODIPINE BESYLATE 10 MG PO TABS
10.0000 mg | ORAL_TABLET | Freq: Every day | ORAL | Status: DC
  Administered 2013-06-13 – 2013-06-15 (×3): 10 mg via ORAL
  Filled 2013-06-12 (×3): qty 1

## 2013-06-12 MED ORDER — PANTOPRAZOLE SODIUM 40 MG PO TBEC: 40.0000 mg | DELAYED_RELEASE_TABLET | Freq: Every day | ORAL | Status: DC

## 2013-06-12 MED ORDER — LORAZEPAM 1 MG PO TABS
1.0000 mg | ORAL_TABLET | Freq: Four times a day (QID) | ORAL | Status: DC | PRN
  Administered 2013-06-13 – 2013-06-14 (×2): 1 mg via ORAL
  Filled 2013-06-12 (×2): qty 1

## 2013-06-12 MED ORDER — PANTOPRAZOLE SODIUM 40 MG PO TBEC
40.0000 mg | DELAYED_RELEASE_TABLET | Freq: Every day | ORAL | Status: DC
  Administered 2013-06-13 – 2013-06-15 (×3): 40 mg via ORAL
  Filled 2013-06-12 (×3): qty 1

## 2013-06-12 MED ORDER — ADULT MULTIVITAMIN W/MINERALS CH
1.0000 | ORAL_TABLET | Freq: Every day | ORAL | Status: DC
  Administered 2013-06-13 – 2013-06-15 (×3): 1 via ORAL
  Filled 2013-06-12 (×3): qty 1

## 2013-06-12 MED ORDER — LORAZEPAM 2 MG/ML IJ SOLN
0.0000 mg | Freq: Four times a day (QID) | INTRAMUSCULAR | Status: AC
  Administered 2013-06-12 – 2013-06-13 (×2): 2 mg via INTRAVENOUS

## 2013-06-12 MED ORDER — SODIUM CHLORIDE 0.9 % IV SOLN
1000.0000 mg | Freq: Two times a day (BID) | INTRAVENOUS | Status: DC
  Administered 2013-06-12 – 2013-06-14 (×4): 1000 mg via INTRAVENOUS
  Filled 2013-06-12 (×5): qty 10

## 2013-06-12 MED ORDER — MORPHINE SULFATE 2 MG/ML IJ SOLN
2.0000 mg | INTRAMUSCULAR | Status: DC | PRN
  Administered 2013-06-12: 2 mg via INTRAVENOUS
  Filled 2013-06-12: qty 1

## 2013-06-12 MED ORDER — SODIUM CHLORIDE 0.9 % IV SOLN
INTRAVENOUS | Status: DC
  Administered 2013-06-12 – 2013-06-14 (×4): via INTRAVENOUS

## 2013-06-12 MED ORDER — LISINOPRIL 20 MG PO TABS
20.0000 mg | ORAL_TABLET | Freq: Every day | ORAL | Status: DC
  Administered 2013-06-13 – 2013-06-14 (×2): 20 mg via ORAL
  Filled 2013-06-12 (×3): qty 1

## 2013-06-12 MED ORDER — CEFAZOLIN SODIUM 1-5 GM-% IV SOLN
1.0000 g | Freq: Three times a day (TID) | INTRAVENOUS | Status: AC
  Administered 2013-06-12 – 2013-06-15 (×9): 1 g via INTRAVENOUS
  Filled 2013-06-12 (×10): qty 50

## 2013-06-12 MED ORDER — PANTOPRAZOLE SODIUM 40 MG IV SOLR
40.0000 mg | Freq: Every day | INTRAVENOUS | Status: DC
  Administered 2013-06-12: 40 mg via INTRAVENOUS
  Filled 2013-06-12 (×3): qty 40

## 2013-06-12 MED ORDER — LORAZEPAM 2 MG/ML IJ SOLN: 0.0000 mg | Freq: Two times a day (BID) | INTRAMUSCULAR | Status: DC

## 2013-06-12 MED ORDER — ONDANSETRON HCL 4 MG PO TABS: 4.0000 mg | ORAL_TABLET | Freq: Four times a day (QID) | ORAL | Status: DC | PRN

## 2013-06-12 MED ORDER — FOLIC ACID 1 MG PO TABS
1.0000 mg | ORAL_TABLET | Freq: Every day | ORAL | Status: DC
  Administered 2013-06-13 – 2013-06-15 (×3): 1 mg via ORAL
  Filled 2013-06-12 (×3): qty 1
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
## 2013-06-12 NOTE — ED Notes (Signed)
The patient continues to get out of bed, states "I am fine, I am fine."
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
## 2013-06-12 NOTE — ED Notes (Signed)
Dr. [name] had discussion with patient who is now agreeable to be admitted/transferred to [facility] for further treatment and care.

## 2013-06-12 NOTE — Progress Notes (Signed)
Pt noted to have a full body seizure with generalized jerking movements at approximately 2145.  Just prior to pt's seizure, pt stopped answering questions and became silent for approximately 30 seconds.  Pt's seizure lasted 30 seconds to 1 minute.  Pt was placed in a left side lying position.  Pt was orally suctioned with pink tinged secretions noted but I was unable to visualize if pt bit his tongue during seizure.  Pt placed on [place] 2L/min. Ativan 2 mg IV given. Pt did have two more episodes of similar seizure activity that lasted less than 10 seconds each.  MDs ([facility]M- Zubelivitsky and Trauma-Tsuei) were notified.  Pt to be started on IV keppra.  Shortly after pt had seizure, MD (NS-Stern) came to bedside and assessed pt.  Dr. Venetia Maxon also notified of pt's seizure episode.  Will continue to monitor.
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
## 2013-06-12 NOTE — Progress Notes (Signed)
eLink Physician-Brief Progress Note Patient Name: AULDEN CALISE DOB: [birth year] MRN: [id number]  Date of Service  06/12/2013   HPI/Events of Note   Seizure  eICU Interventions   Keppra 1000 q12h Trauma / Neurosurgery notified by RN    Intervention Category Major Interventions: Seizures - evaluation and management  Kingdavid Leinbach 06/12/2013, 9:51 PM

## 2013-06-12 NOTE — ED Notes (Signed)
The patient is standing in exam room, has removed his c-collar.  The patient appears confused.  States that he has been drinking alcohol today.

## 2013-06-12 NOTE — ED Notes (Signed)
The patient states that he is leaving the hospital AMA, Dr. [name] called to bedside.
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
## 2013-06-13 ENCOUNTER — Inpatient Hospital Stay (HOSPITAL_COMMUNITY): Payer: Self-pay

## 2013-06-13 LAB — COMPREHENSIVE METABOLIC PANEL
BUN: 21 mg/dL (ref 6–23)
CO2: 28 mEq/L (ref 19–32)
Calcium: 8.9 mg/dL (ref 8.4–10.5)
Chloride: 93 mEq/L — ABNORMAL LOW (ref 96–112)
Creatinine, Ser: 1.3 mg/dL (ref 0.50–1.35)
GFR calc Af Amer: 75 mL/min — ABNORMAL LOW (ref >90)
GFR calc non Af Amer: 64 mL/min — ABNORMAL LOW (ref >90)
Total Bilirubin: 1 mg/dL (ref 0.3–1.2)

## 2013-06-13 LAB — PROTIME-INR
INR: 1.08 (ref 0.00–1.49)
Prothrombin Time: 13.9 seconds (ref 11.6–15.2)

## 2013-06-13 LAB — CBC
HCT: 39.1 % (ref 39.0–52.0)
MCH: 34 pg (ref 26.0–34.0)
MCV: 94.9 fL (ref 78.0–100.0)
Platelets: 181 10*3/uL (ref 150–400)
RBC: 4.12 MIL/uL — ABNORMAL LOW (ref 4.22–5.81)
WBC: 9.3 10*3/uL (ref 4.0–10.5)

## 2013-06-13 LAB — LIPASE, BLOOD: Lipase: 174 U/L — ABNORMAL HIGH (ref 11–59)

## 2013-06-13 MED ORDER — HYDRALAZINE HCL 20 MG/ML IJ SOLN
20.0000 mg | INTRAMUSCULAR | Status: DC | PRN
  Administered 2013-06-13 – 2013-06-14 (×5): 20 mg via INTRAVENOUS
  Filled 2013-06-13 (×5): qty 1

## 2013-06-13 MED ORDER — NICOTINE 14 MG/24HR TD PT24
14.0000 mg | MEDICATED_PATCH | TRANSDERMAL | Status: DC
  Administered 2013-06-13 – 2013-06-14 (×2): 14 mg via TRANSDERMAL
  Filled 2013-06-13 (×3): qty 1

## 2013-06-13 NOTE — Progress Notes (Signed)
Subjective: Patient reports uncooperative and hostile.  Took off collar and SCDs.  Objective: Vital signs in last 24 hours: Temp:  [97.9 F (36.6 C)-99.4 F (37.4 C)] 98.4 F (36.9 C) (06/15 0354) Pulse Rate:  [100-137] 104 (06/15 0315) Resp:  [16-40] 22 (06/15 0300) BP: (92-151)/(44-99) 147/96 mmHg (06/15 0300) SpO2:  [92 %-100 %] 99 % (06/15 0300) Weight:  [71.3 kg (157 lb 3 oz)-73.3 kg (161 lb 9.6 oz)] 73.3 kg (161 lb 9.6 oz) (06/15 0400)  Intake/Output from previous day: 06/14 0701 - 06/15 0700 In: 681.7 [I.V.:521.7; IV Piggyback:160] Out: -  Intake/Output this shift:    Physical Exam: Awake, alert, answers questions reluctantly.  Follows commands and moves all extremities.  Lab Results:  Recent Labs  06/12/13 1812 06/13/13 0530  WBC 8.7 9.3  HGB 15.1 14.0  HCT 42.1 39.1  PLT 203 181   BMET  Recent Labs  06/12/13 1812 06/13/13 0530  NA 130* 134*  K 3.6 3.7  CL 87* 93*  CO2 25 28  GLUCOSE 117* 93  BUN 18 21  CREATININE 1.86* 1.30  CALCIUM 9.7 8.9    Studies/Results: Ct Head Wo Contrast  06/12/2013   *RADIOLOGY REPORT*  Clinical Data:  Fall.  Head injury.  Headache.  Mandible fracture.  CT HEAD WITHOUT CONTRAST CT CERVICAL SPINE WITHOUT CONTRAST  Technique:  Multidetector CT imaging of the head and cervical spine was performed following the standard protocol without intravenous contrast.  Multiplanar CT image reconstructions of the cervical spine were also generated.  Comparison:  Head CT only on 07/27/2011  CT HEAD  Findings: Mild subarachnoid hemorrhage is seen in the frontal regions bilaterally, and there is mild subdural blood extending along the anterior and posterior falx. A probable tiny intraparenchymal hemorrhagic contusion is also seen in the medial right frontal lobe on image 14, without associated mass effect.  No evidence of brain edema or other signs of acute cerebral infarction.  No evidence of significant mass effect or midline shift.   Ventricles are normal in size.  No evidence of intraventricular hemorrhage.  An acute non depressed midline occipital skull fracture is seen. No evidence of pneumocephalus  or other acute skull fractures.  Old fracture deformities are seen involve the medial walls of the orbits bilaterally.  IMPRESSION:  1.  Mild bilateral frontal subarachnoid hemorrhage, and probable tiny intraparenchymal hemorrhagic contusion in the right frontal lobe. 2.  Mild subdural hematoma extending along the falx. 3.  No evidence of mass effect or midline shift. 4.  Nondepressed midline occipital skull fracture.  CT CERVICAL SPINE  Findings: No evidence of acute cervical spine fracture or subluxation.  Mild to moderate degenerative disc disease is seen from levels of C3-C7. Mild left-sided facet DJD also noted.  IMPRESSION:  1.  No acute cervical spine fracture or subluxation. 2.  Degenerative cervical spondylosis.  Critical Value/emergent results were called by telephone at the time of interpretation on 06/12/2013 at 1800 hours to Dr. [name], who verbally acknowledged these results.   Original Report Authenticated By: Myles Rosenthal, M.D.   Ct Cervical Spine Wo Contrast  06/12/2013   *RADIOLOGY REPORT*  Clinical Data:  Fall.  Head injury.  Headache.  Mandible fracture.  CT HEAD WITHOUT CONTRAST CT CERVICAL SPINE WITHOUT CONTRAST  Technique:  Multidetector CT imaging of the head and cervical spine was performed following the standard protocol without intravenous contrast.  Multiplanar CT image reconstructions of the cervical spine were also generated.  Comparison:  Head CT only on  07/27/2011  CT HEAD  Findings: Mild subarachnoid hemorrhage is seen in the frontal regions bilaterally, and there is mild subdural blood extending along the anterior and posterior falx. A probable tiny intraparenchymal hemorrhagic contusion is also seen in the medial right frontal lobe on image 14, without associated mass effect.  No evidence of brain edema or  other signs of acute cerebral infarction.  No evidence of significant mass effect or midline shift.  Ventricles are normal in size.  No evidence of intraventricular hemorrhage.  An acute non depressed midline occipital skull fracture is seen. No evidence of pneumocephalus  or other acute skull fractures.  Old fracture deformities are seen involve the medial walls of the orbits bilaterally.  IMPRESSION:  1.  Mild bilateral frontal subarachnoid hemorrhage, and probable tiny intraparenchymal hemorrhagic contusion in the right frontal lobe. 2.  Mild subdural hematoma extending along the falx. 3.  No evidence of mass effect or midline shift. 4.  Nondepressed midline occipital skull fracture.  CT CERVICAL SPINE  Findings: No evidence of acute cervical spine fracture or subluxation.  Mild to moderate degenerative disc disease is seen from levels of C3-C7. Mild left-sided facet DJD also noted.  IMPRESSION:  1.  No acute cervical spine fracture or subluxation. 2.  Degenerative cervical spondylosis.  Critical Value/emergent results were called by telephone at the time of interpretation on 06/12/2013 at 1800 hours to Dr. [name], who verbally acknowledged these results.   Original Report Authenticated By: Myles Rosenthal, M.D.    Assessment/Plan: Patient uncooperative with care. I reminded him and his family of importance of compliance.  No new seizures.  Observe in ICU.  Head CT stable with bifrontal SAH and contusions.  On keppra.  Needs to stop drinking EtOH.    LOS: 1 day    Dorian Heckle, MD 06/13/2013, 7:30 AM
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
## 2013-06-14 ENCOUNTER — Inpatient Hospital Stay (HOSPITAL_COMMUNITY): Payer: Self-pay

## 2013-06-14 DIAGNOSIS — S066X9A Traumatic subarachnoid hemorrhage with loss of consciousness of unspecified duration, initial encounter: Secondary | ICD-10-CM

## 2013-06-14 DIAGNOSIS — R569 Unspecified convulsions: Secondary | ICD-10-CM | POA: Diagnosis present

## 2013-06-14 DIAGNOSIS — I6200 Nontraumatic subdural hemorrhage, unspecified: Secondary | ICD-10-CM

## 2013-06-14 LAB — CBC
MCH: 34 pg (ref 26.0–34.0)
MCHC: 35.8 g/dL (ref 30.0–36.0)
Platelets: 178 10*3/uL (ref 150–400)
RDW: 14.1 % (ref 11.5–15.5)

## 2013-06-14 LAB — LIPASE, BLOOD: Lipase: 1063 U/L — ABNORMAL HIGH (ref 11–59)

## 2013-06-14 MED ORDER — HYDROCODONE-ACETAMINOPHEN 10-325 MG PO TABS
0.5000 | ORAL_TABLET | ORAL | Status: DC | PRN
  Administered 2013-06-14 – 2013-06-15 (×3): 1 via ORAL
  Filled 2013-06-14 (×3): qty 1

## 2013-06-14 MED ORDER — MORPHINE SULFATE 2 MG/ML IJ SOLN: 2.0000 mg | INTRAMUSCULAR | Status: DC | PRN

## 2013-06-14 MED ORDER — LEVETIRACETAM 500 MG PO TABS
1000.0000 mg | ORAL_TABLET | Freq: Two times a day (BID) | ORAL | Status: DC
  Administered 2013-06-14 – 2013-06-15 (×2): 1000 mg via ORAL
  Filled 2013-06-14 (×3): qty 2

## 2013-06-14 NOTE — Progress Notes (Signed)
Improved MS Therapies Transfer to floor I D/W Dr. Venetia Maxon on the unit Patient examined and I agree with the assessment and plan  Violeta Gelinas, MD, MPH, FACS Pager: [phone]  06/14/2013 10:33 AM

## 2013-06-14 NOTE — Progress Notes (Signed)
Subjective: Patient reports feeling better, no longer tearing off collar.  Objective: Vital signs in last 24 hours: Temp:  [97.6 F (36.4 C)-99.5 F (37.5 C)] 97.6 F (36.4 C) (06/16 0821) Pulse Rate:  [30-121] 105 (06/16 0900) Resp:  [12-29] 22 (06/16 0900) BP: (121-170)/(81-120) 151/96 mmHg (06/16 0900) SpO2:  [92 %-100 %] 100 % (06/16 0900) Weight:  [75.4 kg (166 lb 3.6 oz)] 75.4 kg (166 lb 3.6 oz) (06/16 0500)  Intake/Output from previous day: 06/15 0701 - 06/16 0700 In: 3005 [P.O.:360; I.V.:2275; IV Piggyback:370] Out: 780 [Urine:780] Intake/Output this shift: Total I/O In: 540 [P.O.:240; I.V.:200; IV Piggyback:100] Out: -   Physical Exam: Awake, alert, cooperative.  No more seizures.  Lab Results:  Recent Labs  06/13/13 0530 06/14/13 0410  WBC 9.3 7.8  HGB 14.0 13.6  HCT 39.1 38.0*  PLT 181 178   BMET  Recent Labs  06/12/13 1812 06/13/13 0530  NA 130* 134*  K 3.6 3.7  CL 87* 93*  CO2 25 28  GLUCOSE 117* 93  BUN 18 21  CREATININE 1.86* 1.30  CALCIUM 9.7 8.9    Studies/Results: Ct Head Without Contrast  06/13/2013   *RADIOLOGY REPORT*  Clinical Data: Traumatic bleed.  CT HEAD WITHOUT CONTRAST  Technique:  Contiguous axial images were obtained from the base of the skull through the vertex without contrast.  Comparison: 06/12/2013.  Findings: Nondisplaced occipital fracture.  Air-fluid level within the right sphenoid sinus air cell.  On the prior CT, there is a small amount of gas adjacent to the cavernous sinus region and on the present examination, there is slight irregularity of the clivus however, discrete central basilar skull fracture is not identified.  Bilateral anterior frontal hemorrhagic contusions with slight increase in amount of blood/edema greater anterior left frontal lobe.  Now noted is a parenchymal contusion adjacent to the left pterion.  Broad-based right parafalcine subdural hematoma with blood extending along the tentorium greater on the  right.  Interval development of a small amount of subarachnoid blood most notable posterior left sylvian fissure.  No acute thrombotic infarct.  Right parietal scalp hematoma.  Bilateral medial orbital wall fracture of questionable age.  Left zygomatic arch fracture appears remote.  IMPRESSION: Mild increased hemorrhage and edema associated with bilateral anterior frontal lobe hemorrhagic contusions.  Development of hemorrhagic contusion adjacent to the left pterion.  Relatively similar appearance of broad-based right parafalcine subdural hematoma with extension along the tentorium.  Interval development of small amount of subarachnoid blood most notable left sylvian fissure.  Question tiny amount of subarachnoid blood right pontomedullary region.  Nondisplaced occipital lobe fracture.  Secondary findings raise the possibility of central skull base fracture which is not well delineated on present exam.   Original Report Authenticated By: Lacy Duverney, M.D.   Ct Head Wo Contrast  06/12/2013   *RADIOLOGY REPORT*  Clinical Data:  Fall.  Head injury.  Headache.  Mandible fracture.  CT HEAD WITHOUT CONTRAST CT CERVICAL SPINE WITHOUT CONTRAST  Technique:  Multidetector CT imaging of the head and cervical spine was performed following the standard protocol without intravenous contrast.  Multiplanar CT image reconstructions of the cervical spine were also generated.  Comparison:  Head CT only on 07/27/2011  CT HEAD  Findings: Mild subarachnoid hemorrhage is seen in the frontal regions bilaterally, and there is mild subdural blood extending along the anterior and posterior falx. A probable tiny intraparenchymal hemorrhagic contusion is also seen in the medial right frontal lobe on image 14, without associated  mass effect.  No evidence of brain edema or other signs of acute cerebral infarction.  No evidence of significant mass effect or midline shift.  Ventricles are normal in size.  No evidence of intraventricular  hemorrhage.  An acute non depressed midline occipital skull fracture is seen. No evidence of pneumocephalus  or other acute skull fractures.  Old fracture deformities are seen involve the medial walls of the orbits bilaterally.  IMPRESSION:  1.  Mild bilateral frontal subarachnoid hemorrhage, and probable tiny intraparenchymal hemorrhagic contusion in the right frontal lobe. 2.  Mild subdural hematoma extending along the falx. 3.  No evidence of mass effect or midline shift. 4.  Nondepressed midline occipital skull fracture.  CT CERVICAL SPINE  Findings: No evidence of acute cervical spine fracture or subluxation.  Mild to moderate degenerative disc disease is seen from levels of C3-C7. Mild left-sided facet DJD also noted.  IMPRESSION:  1.  No acute cervical spine fracture or subluxation. 2.  Degenerative cervical spondylosis.  Critical Value/emergent results were called by telephone at the time of interpretation on 06/12/2013 at 1800 hours to Dr. [name], who verbally acknowledged these results.   Original Report Authenticated By: Myles Rosenthal, M.D.   Ct Cervical Spine Wo Contrast  06/12/2013   *RADIOLOGY REPORT*  Clinical Data:  Fall.  Head injury.  Headache.  Mandible fracture.  CT HEAD WITHOUT CONTRAST CT CERVICAL SPINE WITHOUT CONTRAST  Technique:  Multidetector CT imaging of the head and cervical spine was performed following the standard protocol without intravenous contrast.  Multiplanar CT image reconstructions of the cervical spine were also generated.  Comparison:  Head CT only on 07/27/2011  CT HEAD  Findings: Mild subarachnoid hemorrhage is seen in the frontal regions bilaterally, and there is mild subdural blood extending along the anterior and posterior falx. A probable tiny intraparenchymal hemorrhagic contusion is also seen in the medial right frontal lobe on image 14, without associated mass effect.  No evidence of brain edema or other signs of acute cerebral infarction.  No evidence of  significant mass effect or midline shift.  Ventricles are normal in size.  No evidence of intraventricular hemorrhage.  An acute non depressed midline occipital skull fracture is seen. No evidence of pneumocephalus  or other acute skull fractures.  Old fracture deformities are seen involve the medial walls of the orbits bilaterally.  IMPRESSION:  1.  Mild bilateral frontal subarachnoid hemorrhage, and probable tiny intraparenchymal hemorrhagic contusion in the right frontal lobe. 2.  Mild subdural hematoma extending along the falx. 3.  No evidence of mass effect or midline shift. 4.  Nondepressed midline occipital skull fracture.  CT CERVICAL SPINE  Findings: No evidence of acute cervical spine fracture or subluxation.  Mild to moderate degenerative disc disease is seen from levels of C3-C7. Mild left-sided facet DJD also noted.  IMPRESSION:  1.  No acute cervical spine fracture or subluxation. 2.  Degenerative cervical spondylosis.  Critical Value/emergent results were called by telephone at the time of interpretation on 06/12/2013 at 1800 hours to Dr. [name], who verbally acknowledged these results.   Original Report Authenticated By: Myles Rosenthal, M.D.    Assessment/Plan: To have C-spine cleared.  Mobilize.  Continue keppra.    LOS: 2 days    Dorian Heckle, MD 06/14/2013, 10:01 AM
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
## 2013-06-14 NOTE — Progress Notes (Signed)
Patient ID: Paul [name], male   DOB: [birth year], 47 y.o.   MRN: [id number]   LOS: 2 days   Subjective: No c/o. Admits to throat and neck pain when asked. 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   Objective: Vital signs in last 24 hours: Temp:  [97.6 F (36.4 C)-99.5 F (37.5 C)] 97.6 F (36.4 C) (06/16 0821) Pulse Rate:  [30-121] 105 (06/16 0900) Resp:  [12-29] 22 (06/16 0900) BP: (121-170)/(81-120) 151/96 mmHg (06/16 0900) SpO2:  [92 %-100 %] 100 % (06/16 0900) Weight:  [166 lb 3.6 oz (75.4 kg)] 166 lb 3.6 oz (75.4 kg) (06/16 0500)     Laboratory  CBC  Recent Labs  06/13/13 0530 06/14/13 0410  WBC 9.3 7.8  HGB 14.0 13.6  HCT 39.1 38.0*  PLT 181 178    Physical Exam General appearance: alert and no distress Resp: clear to auscultation bilaterally Cardio: regular rate and rhythm GI: normal findings: bowel sounds normal and soft, non-tender Head: Wounds clean   Assessment/Plan: 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 Will have neurology consult EtOH Multiple medical problems FEN -- Flex/ex to clear c-spine VTE -- SCD's Dispo -- To floor   Freeman Caldron, PA-C Pager: [phone] General Trauma PA Pager: [phone]   06/14/2013
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
## 2013-06-15 DIAGNOSIS — S065X9A Traumatic subdural hemorrhage with loss of consciousness of unspecified duration, initial encounter: Secondary | ICD-10-CM

## 2013-06-15 DIAGNOSIS — I10 Essential (primary) hypertension: Secondary | ICD-10-CM

## 2013-06-15 DIAGNOSIS — S0291XB Unspecified fracture of skull, initial encounter for open fracture: Secondary | ICD-10-CM

## 2013-06-15 DIAGNOSIS — S065XAA Traumatic subdural hemorrhage with loss of consciousness status unknown, initial encounter: Secondary | ICD-10-CM

## 2013-06-15 DIAGNOSIS — S06369A Traumatic hemorrhage of cerebrum, unspecified, with loss of consciousness of unspecified duration, initial encounter: Secondary | ICD-10-CM

## 2013-06-15 DIAGNOSIS — S0636AA Traumatic hemorrhage of cerebrum, unspecified, with loss of consciousness status unknown, initial encounter: Secondary | ICD-10-CM

## 2013-06-15 DIAGNOSIS — S0101XA Laceration without foreign body of scalp, initial encounter: Secondary | ICD-10-CM

## 2013-06-15 HISTORY — DX: Traumatic hemorrhage of cerebrum, unspecified, with loss of consciousness status unknown, initial encounter: S06.36AA

## 2013-06-15 HISTORY — DX: Traumatic hemorrhage of cerebrum, unspecified, with loss of consciousness of unspecified duration, initial encounter: S06.369A

## 2013-06-15 HISTORY — DX: Unspecified fracture of skull, initial encounter for open fracture: S02.91XB

## 2013-06-15 HISTORY — DX: Traumatic subdural hemorrhage with loss of consciousness status unknown, initial encounter: S06.5XAA

## 2013-06-15 HISTORY — DX: Traumatic subdural hemorrhage with loss of consciousness of unspecified duration, initial encounter: S06.5X9A

## 2013-06-15 MED ORDER — LISINOPRIL 40 MG PO TABS: 40.0000 mg | ORAL_TABLET | Freq: Every day | ORAL | Status: DC

## 2013-06-15 MED ORDER — HYDROCODONE-ACETAMINOPHEN 10-325 MG PO TABS: 1.0000 | ORAL_TABLET | ORAL | Status: DC | PRN

## 2013-06-15 MED ORDER — LEVETIRACETAM 1000 MG PO TABS: 1000.0000 mg | ORAL_TABLET | Freq: Two times a day (BID) | ORAL | Status: DC

## 2013-06-15 MED ORDER — LISINOPRIL 40 MG PO TABS
40.0000 mg | ORAL_TABLET | Freq: Every day | ORAL | Status: DC
  Administered 2013-06-15 (×2): 20 mg via ORAL
  Filled 2013-06-15 (×2): qty 1

## 2013-06-15 NOTE — Progress Notes (Signed)
Pt d/c to home by car with friend. Assessment stable. Prescriptions given. Pt verbalizes understanding of d/c instructions. 

## 2013-06-15 NOTE — Progress Notes (Signed)
PT Cancellation Note  Patient Details Name: Paul [name] MRN: [id number] DOB: 03-13-1966   Cancelled Treatment:    Reason Eval/Treat Not Completed: Other (comment) (pt politely refused activity with PT) 06/15/2013  [place] Bing, PT [phone]  (pager)  Rose Hegner, Eliseo Gum 06/15/2013, 3:21 PM
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
## 2013-06-15 NOTE — Progress Notes (Signed)
Doing better.  Possibly D/C home with friend this afternoon. Plan D/W patient. Patient examined and I agree with the assessment and plan  Violeta Gelinas, MD, MPH, FACS Pager: [phone]  06/15/2013 9:51 AM
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
## 2013-06-15 NOTE — Progress Notes (Signed)
UR completed 

## 2013-06-15 NOTE — Discharge Summary (Signed)
Paul Tigges, MD, MPH, FACS Pager: [phone]  

## 2013-06-16 NOTE — Clinical Social Work Note (Signed)
Clinical Social Worker was following patient for outpatient substance abuse resources, however patient discharged prior to resources being given.  Patient discharged home with patient friend, [name].  Patient did not express further concerns regarding outpatient resources prior to discharge.  Clinical Social Worker will sign off for now as social work intervention is no longer needed. Please consult [place] again if new need arises.  Macario Golds, [place] [phone]

## 2013-07-16 ENCOUNTER — Inpatient Hospital Stay (HOSPITAL_COMMUNITY): Payer: Self-pay

## 2013-07-16 ENCOUNTER — Encounter (HOSPITAL_COMMUNITY): Payer: Self-pay | Admitting: *Deleted

## 2013-07-16 ENCOUNTER — Inpatient Hospital Stay (HOSPITAL_COMMUNITY)
Admission: EM | Admit: 2013-07-16 | Discharge: 2013-07-20 | DRG: 439 | Disposition: A | Discharge status: Home / Self Care | Payer: Self-pay | Attending: Internal Medicine | Admitting: Internal Medicine

## 2013-07-16 DIAGNOSIS — F1010 Alcohol abuse, uncomplicated: Secondary | ICD-10-CM | POA: Diagnosis present

## 2013-07-16 DIAGNOSIS — K860 Alcohol-induced chronic pancreatitis: Secondary | ICD-10-CM

## 2013-07-16 DIAGNOSIS — D61818 Other pancytopenia: Secondary | ICD-10-CM | POA: Diagnosis present

## 2013-07-16 DIAGNOSIS — E785 Hyperlipidemia, unspecified: Secondary | ICD-10-CM | POA: Diagnosis present

## 2013-07-16 DIAGNOSIS — Z9119 Patient's noncompliance with other medical treatment and regimen: Secondary | ICD-10-CM

## 2013-07-16 DIAGNOSIS — I10 Essential (primary) hypertension: Secondary | ICD-10-CM | POA: Diagnosis present

## 2013-07-16 DIAGNOSIS — K861 Other chronic pancreatitis: Secondary | ICD-10-CM | POA: Diagnosis present

## 2013-07-16 DIAGNOSIS — Z87898 Personal history of other specified conditions: Secondary | ICD-10-CM | POA: Insufficient documentation

## 2013-07-16 DIAGNOSIS — K859 Acute pancreatitis without necrosis or infection, unspecified: Principal | ICD-10-CM | POA: Diagnosis present

## 2013-07-16 DIAGNOSIS — E876 Hypokalemia: Secondary | ICD-10-CM | POA: Diagnosis present

## 2013-07-16 DIAGNOSIS — Z720 Tobacco use: Secondary | ICD-10-CM | POA: Diagnosis present

## 2013-07-16 DIAGNOSIS — Z91199 Patient's noncompliance with other medical treatment and regimen due to unspecified reason: Secondary | ICD-10-CM

## 2013-07-16 DIAGNOSIS — R17 Unspecified jaundice: Secondary | ICD-10-CM | POA: Diagnosis present

## 2013-07-16 DIAGNOSIS — K920 Hematemesis: Secondary | ICD-10-CM

## 2013-07-16 DIAGNOSIS — K852 Alcohol induced acute pancreatitis without necrosis or infection: Secondary | ICD-10-CM

## 2013-07-16 DIAGNOSIS — R55 Syncope and collapse: Secondary | ICD-10-CM | POA: Diagnosis present

## 2013-07-16 DIAGNOSIS — F17200 Nicotine dependence, unspecified, uncomplicated: Secondary | ICD-10-CM | POA: Diagnosis present

## 2013-07-16 DIAGNOSIS — F1410 Cocaine abuse, uncomplicated: Secondary | ICD-10-CM | POA: Diagnosis present

## 2013-07-16 DIAGNOSIS — R569 Unspecified convulsions: Secondary | ICD-10-CM

## 2013-07-16 DIAGNOSIS — F10929 Alcohol use, unspecified with intoxication, unspecified: Secondary | ICD-10-CM

## 2013-07-16 DIAGNOSIS — G40909 Epilepsy, unspecified, not intractable, without status epilepticus: Secondary | ICD-10-CM | POA: Diagnosis present

## 2013-07-16 HISTORY — DX: Epilepsy, unspecified, not intractable, without status epilepticus: G40.909

## 2013-07-16 HISTORY — DX: Unspecified fracture of skull, initial encounter for open fracture: S02.91XB

## 2013-07-16 HISTORY — DX: Patient's noncompliance with other medical treatment and regimen: Z91.19

## 2013-07-16 HISTORY — DX: Traumatic subdural hemorrhage with loss of consciousness of unspecified duration, initial encounter: S06.5X9A

## 2013-07-16 HISTORY — DX: Traumatic hemorrhage of cerebrum, unspecified, with loss of consciousness of unspecified duration, initial encounter: S06.369A

## 2013-07-16 LAB — COMPREHENSIVE METABOLIC PANEL
ALT: 8 U/L (ref 0–53)
AST: 16 U/L (ref 0–37)
Alkaline Phosphatase: 69 U/L (ref 39–117)
CO2: 29 mEq/L (ref 19–32)
Calcium: 9.4 mg/dL (ref 8.4–10.5)
GFR calc Af Amer: >90 mL/min (ref >90)
Glucose, Bld: 109 mg/dL — ABNORMAL HIGH (ref 70–99)
Potassium: 3.5 mEq/L (ref 3.5–5.1)
Sodium: 141 mEq/L (ref 135–145)
Total Protein: 7.8 g/dL (ref 6.0–8.3)

## 2013-07-16 LAB — CBC WITH DIFFERENTIAL/PLATELET
Basophils Absolute: 0 10*3/uL (ref 0.0–0.1)
Basophils Relative: 0 % (ref 0–1)
Eosinophils Absolute: 0.1 10*3/uL (ref 0.0–0.7)
Eosinophils Relative: 1 % (ref 0–5)
HCT: 42.1 % (ref 39.0–52.0)
Hemoglobin: 14.9 g/dL (ref 13.0–17.0)
MCH: 34.2 pg — ABNORMAL HIGH (ref 26.0–34.0)
MCHC: 35.4 g/dL (ref 30.0–36.0)
Monocytes Absolute: 0.5 10*3/uL (ref 0.1–1.0)
Monocytes Relative: 8 % (ref 3–12)
Neutro Abs: 3.5 10*3/uL (ref 1.7–7.7)
RDW: 13.3 % (ref 11.5–15.5)

## 2013-07-16 MED ORDER — HYDROMORPHONE HCL PF 1 MG/ML IJ SOLN
1.0000 mg | INTRAMUSCULAR | Status: DC | PRN
  Administered 2013-07-16: 2 mg via INTRAVENOUS
  Administered 2013-07-17: 1 mg via INTRAVENOUS
  Administered 2013-07-17: 2 mg via INTRAVENOUS
  Administered 2013-07-17 (×2): 1 mg via INTRAVENOUS
  Administered 2013-07-17 – 2013-07-18 (×4): 2 mg via INTRAVENOUS
  Filled 2013-07-16 (×2): qty 2
  Filled 2013-07-16: qty 1
  Filled 2013-07-16 (×5): qty 2

## 2013-07-16 MED ORDER — SODIUM CHLORIDE 0.9 % IV SOLN: INTRAVENOUS | Status: DC

## 2013-07-16 MED ORDER — ENOXAPARIN SODIUM 40 MG/0.4ML ~~LOC~~ SOLN
40.0000 mg | SUBCUTANEOUS | Status: DC
  Administered 2013-07-16 – 2013-07-19 (×4): 40 mg via SUBCUTANEOUS
  Filled 2013-07-16 (×4): qty 0.4

## 2013-07-16 MED ORDER — LORAZEPAM 2 MG/ML IJ SOLN
1.0000 mg | Freq: Four times a day (QID) | INTRAMUSCULAR | Status: AC | PRN
  Administered 2013-07-18: 1 mg via INTRAVENOUS
  Filled 2013-07-16: qty 1

## 2013-07-16 MED ORDER — ACETAMINOPHEN 650 MG RE SUPP: 650.0000 mg | Freq: Four times a day (QID) | RECTAL | Status: DC | PRN

## 2013-07-16 MED ORDER — PANTOPRAZOLE SODIUM 40 MG IV SOLR
40.0000 mg | Freq: Two times a day (BID) | INTRAVENOUS | Status: DC
  Administered 2013-07-17 – 2013-07-20 (×7): 40 mg via INTRAVENOUS
  Filled 2013-07-16 (×7): qty 40

## 2013-07-16 MED ORDER — HYDROMORPHONE HCL PF 1 MG/ML IJ SOLN
1.0000 mg | Freq: Once | INTRAMUSCULAR | Status: AC
  Administered 2013-07-16: 1 mg via INTRAVENOUS
  Filled 2013-07-16: qty 1

## 2013-07-16 MED ORDER — LORAZEPAM 1 MG PO TABS: 1.0000 mg | ORAL_TABLET | Freq: Four times a day (QID) | ORAL | Status: AC | PRN

## 2013-07-16 MED ORDER — SODIUM CHLORIDE 0.9 % IJ SOLN
3.0000 mL | Freq: Two times a day (BID) | INTRAMUSCULAR | Status: DC
Start: 2013-07-16 — End: 2013-07-16

## 2013-07-16 MED ORDER — ACETAMINOPHEN 325 MG PO TABS: 650.0000 mg | ORAL_TABLET | Freq: Four times a day (QID) | ORAL | Status: DC | PRN

## 2013-07-16 MED ORDER — POTASSIUM CHLORIDE IN NACL 20-0.9 MEQ/L-% IV SOLN
INTRAVENOUS | Status: DC
  Administered 2013-07-16 (×2): via INTRAVENOUS

## 2013-07-16 MED ORDER — CLONIDINE HCL 0.2 MG/24HR TD PTWK
0.2000 mg | MEDICATED_PATCH | TRANSDERMAL | Status: DC
  Administered 2013-07-16: 0.2 mg via TRANSDERMAL
  Filled 2013-07-16: qty 1

## 2013-07-16 MED ORDER — HYDRALAZINE HCL 20 MG/ML IJ SOLN
10.0000 mg | Freq: Four times a day (QID) | INTRAMUSCULAR | Status: DC | PRN
  Administered 2013-07-16 – 2013-07-20 (×6): 10 mg via INTRAVENOUS
  Filled 2013-07-16 (×6): qty 1

## 2013-07-16 MED ORDER — SODIUM CHLORIDE 0.9 % IV SOLN
1000.0000 mg | Freq: Two times a day (BID) | INTRAVENOUS | Status: DC
  Administered 2013-07-16 – 2013-07-19 (×6): 1000 mg via INTRAVENOUS
  Filled 2013-07-16 (×6): qty 10

## 2013-07-16 MED ORDER — ASPIRIN 300 MG RE SUPP
300.0000 mg | Freq: Every day | RECTAL | Status: DC
  Filled 2013-07-16 (×3): qty 1

## 2013-07-16 MED ORDER — SODIUM CHLORIDE 0.9 % IV BOLUS (SEPSIS)
1000.0000 mL | Freq: Once | INTRAVENOUS | Status: AC
  Administered 2013-07-16: 1000 mL via INTRAVENOUS

## 2013-07-16 MED ORDER — ONDANSETRON HCL 4 MG/2ML IJ SOLN: 4.0000 mg | Freq: Four times a day (QID) | INTRAMUSCULAR | Status: DC | PRN

## 2013-07-16 MED ORDER — HYDROMORPHONE HCL PF 1 MG/ML IJ SOLN
INTRAMUSCULAR | Status: AC
  Administered 2013-07-16: 1 mg via INTRAVENOUS
  Filled 2013-07-16: qty 1

## 2013-07-16 MED ORDER — PANTOPRAZOLE SODIUM 40 MG IV SOLR
40.0000 mg | INTRAVENOUS | Status: DC
  Administered 2013-07-16: 40 mg via INTRAVENOUS
  Filled 2013-07-16: qty 40

## 2013-07-16 MED ORDER — HYDROMORPHONE HCL PF 1 MG/ML IJ SOLN: 1.0000 mg | INTRAMUSCULAR | Status: DC | PRN

## 2013-07-16 MED ORDER — LORAZEPAM 2 MG/ML IJ SOLN
INTRAMUSCULAR | Status: AC
  Administered 2013-07-16: 1 mg
  Filled 2013-07-16: qty 1

## 2013-07-16 MED ORDER — LABETALOL HCL 5 MG/ML IV SOLN
5.0000 mg | INTRAVENOUS | Status: DC
  Administered 2013-07-16 – 2013-07-18 (×12): 5 mg via INTRAVENOUS
  Filled 2013-07-16 (×19): qty 4

## 2013-07-16 MED ORDER — THIAMINE HCL 100 MG/ML IJ SOLN
100.0000 mg | Freq: Every day | INTRAMUSCULAR | Status: DC
  Administered 2013-07-16 – 2013-07-18 (×3): 100 mg via INTRAVENOUS
  Filled 2013-07-16 (×3): qty 2

## 2013-07-16 MED ORDER — ONDANSETRON HCL 4 MG PO TABS: 4.0000 mg | ORAL_TABLET | Freq: Four times a day (QID) | ORAL | Status: DC | PRN

## 2013-07-16 MED ORDER — ONDANSETRON HCL 4 MG/2ML IJ SOLN
4.0000 mg | Freq: Once | INTRAMUSCULAR | Status: AC
  Administered 2013-07-16: 4 mg via INTRAVENOUS
  Filled 2013-07-16: qty 2

## 2013-07-16 MED ORDER — DOCUSATE SODIUM 100 MG PO CAPS
100.0000 mg | ORAL_CAPSULE | Freq: Two times a day (BID) | ORAL | Status: DC
  Administered 2013-07-16 – 2013-07-17 (×2): 100 mg via ORAL
  Filled 2013-07-16 (×2): qty 1

## 2013-07-16 MED ORDER — CLONIDINE HCL 0.3 MG/24HR TD PTWK
0.3000 mg | MEDICATED_PATCH | TRANSDERMAL | Status: DC
  Administered 2013-07-16: 0.3 mg via TRANSDERMAL
  Filled 2013-07-16 (×2): qty 1

## 2013-07-16 MED ORDER — NICOTINE 21 MG/24HR TD PT24
21.0000 mg | MEDICATED_PATCH | Freq: Every day | TRANSDERMAL | Status: DC
  Administered 2013-07-16 – 2013-07-20 (×5): 21 mg via TRANSDERMAL
  Filled 2013-07-16 (×5): qty 1

## 2013-07-16 NOTE — ED Provider Notes (Signed)
History  This chart was scribed for Donnetta Hutching, MD, by Yevette Edwards, ED Scribe. This patient was seen in room APA12/APA12 and the patient's care was started at 11:05 AM.  CSN: [id number] Arrival date & time 07/16/13  1039  First MD Initiated Contact with Patient 07/16/13 1058     Chief Complaint  Patient presents with  . Abdominal Pain  . Nausea    The history is provided by the patient. No language interpreter was used.   HPI Comments: Paul [name] is a 47 y.o. male, diagnosed with pancreatitis two years ago, who complains of sudden-onset  constant sharp periumbilical pain for the past week with associated nausea and decreased oral intake.   He ranks his pain as an 8/10.  No radiation    Past Medical History  Diagnosis Date  . Pancreatitis, acute     First episode earlier in 2012, hospitalized again in 02/2012 and 09/2012  . Hypertension     noncompliance  . Hyperlipidemia     noncompliance  . Lung collapse     h/o  . ETOH abuse   . Cocaine abuse   . DTs (delirium tremens)     history of  . Nephrolithiasis   . Tobacco abuse    Past Surgical History  Procedure Laterality Date  . Mandible fracture surgery  2006  . Left axillary      surgery for deep laceration   Family History  Problem Relation Age of Onset  . Diabetes type II Mother   . Liver disease Neg Hx   . Colon cancer Neg Hx   . GI problems Neg Hx    History  Substance Use Topics  . Smoking status: Current Every Day Smoker -- 1.00 packs/day    Types: Cigarettes  . Smokeless tobacco: Not on file  . Alcohol Use: Yes     Comment: Three 40oz per day    Review of Systems  A complete 10 system review of systems was obtained, and all systems were negative except where indicated in the HPI and PE.   Allergies  Review of patient's allergies indicates no known allergies.  Home Medications   Current Outpatient Rx  Name  Route  Sig  Dispense  Refill  . amLODipine (NORVASC) 10 MG tablet   Oral    Take 1 tablet (10 mg total) by mouth daily.   30 tablet   2   . hydrALAZINE (APRESOLINE) 50 MG tablet   Oral   Take 1 tablet (50 mg total) by mouth every 12 (twelve) hours. Another medication to treat her high blood pressure.   60 tablet   2   . HYDROcodone-acetaminophen (NORCO) 10-325 MG per tablet   Oral   Take 1-2 tablets by mouth every 4 (four) hours as needed for pain.   120 tablet   0   . levETIRAcetam (KEPPRA) 1000 MG tablet   Oral   Take 1 tablet (1,000 mg total) by mouth 2 (two) times daily.   60 tablet   0   . lisinopril (PRINIVIL,ZESTRIL) 40 MG tablet   Oral   Take 1 tablet (40 mg total) by mouth daily.   30 tablet   0   . Multiple Vitamin (MULTIVITAMIN WITH MINERALS) TABS   Oral   Take 1 tablet by mouth daily.         . pantoprazole (PROTONIX) 40 MG tablet   Oral   Take 1 tablet (40 mg total) by mouth daily at 12  noon. Medication to treat acid reflux and to help to prevent stomach ulcers.   30 tablet   2   . thiamine 100 MG tablet   Oral   Take 1 tablet (100 mg total) by mouth daily.          Triage Vitals: BP 170/134  Pulse 97  Temp(Src) 98.1 F (36.7 C) (Oral)  Resp 18  Ht 6' (1.829 m)  Wt 170 lb (77.111 kg)  BMI 23.05 kg/m2  SpO2 100%  Physical Exam  Nursing note and vitals reviewed. Constitutional: He is oriented to person, place, and time. He appears well-developed and well-nourished.  HENT:  Head: Normocephalic and atraumatic.  Eyes: Conjunctivae and EOM are normal. Pupils are equal, round, and reactive to light.  Neck: Normal range of motion. Neck supple.  Cardiovascular: Normal rate, regular rhythm and normal heart sounds.   Pulmonary/Chest: Effort normal and breath sounds normal.  Abdominal: Soft. Bowel sounds are normal. There is tenderness.  Musculoskeletal: Normal range of motion.  Neurological: He is alert and oriented to person, place, and time.  Skin: Skin is warm and dry.  Psychiatric: He has a normal mood and affect.     ED Course  Procedures (including critical care time)  COORDINATION OF CARE:  11:10 AM-Discussed treatment plan with patient which includes pain medication and blood work, and the patient agreed to the plan.   DIAGNOSTIC STUDIES:  Oxygen Saturation is 100% on room air, normal by my interpretation.    Labs Reviewed  COMPREHENSIVE METABOLIC PANEL - Abnormal; Notable for the following:    Glucose, Bld 109 (*)    All other components within normal limits  CBC WITH DIFFERENTIAL - Abnormal; Notable for the following:    MCH 34.2 (*)    All other components within normal limits  LIPASE, BLOOD - Abnormal; Notable for the following:    Lipase 254 (*)    All other components within normal limits   No results found. No diagnosis found.  MDM  And physical consistent with recurrent pancreatitis. IV hydration. Pain management. Admit   I personally performed the services described in this documentation, which was scribed in my presence. The recorded information has been reviewed and is accurate.    Donnetta Hutching, MD 07/16/13 [phone]

## 2013-07-16 NOTE — ED Notes (Signed)
Pt c/o upper abdominal pain x1 week. Pt has hx of pancreatitis and states this feels similar to past episodes. Pt reports nausea but denies V/D. Pt states he has not been able to eat/drink due to increased pain. Pt describes pain as sharp.

## 2013-07-16 NOTE — ED Notes (Signed)
Patient given something to drink per RN approval. 

## 2013-07-16 NOTE — ED Notes (Signed)
Chronic pancreatitis w/flare up for about 1 week.  Abdominal pain 8/10. Was in hospital last week for frequent falls in which he hit his head.  Is suppose to follow up w/neurologist.

## 2013-07-16 NOTE — ED Notes (Signed)
Patient given a drink per RN approval. 

## 2013-07-16 NOTE — H&P (Signed)
[facility] History and Physical  Paul [name] FAO:[id number] DOB: [birth year] DOA: 07/16/2013  Referring physician: Dr. Donnetta Hutching PCP: No PCP Per Patient  Specialists:   Chief Complaint: Abdominal pain and blacking out  HPI: Paul [name] is a 47 y.o. male with a past medical history significant for polysubstance abuse, pancreatitis, noncompliance, hypertension. He was discharged from [facility] cone on 06/15/2013 after being treated for traumatic injury cerebral hemorrhage and open skull fracture.  During that hospitalization he also suffered convulsions and seizures. Neurology was uncertain about whether they were from substance abuse or head trauma or both.   He presents to the [facility] ED today complaining of abdominal pain that has become worse over the past week. He reports it feels similar to his pancreatitis pain. He has been nauseated and has been unable to vomit and unable to eat since yesterday. He reports that he last used cocaine yesterday June 17. He tells me he drinks daily both beer and hard liquor. When asked to quantify his drinking he replied "as much as I can". He reports that he takes NSAIDs daily for stomach pain. Further he complains of blackout spells that have happened to him approximately 2 or 3 times a week since his traumatic accident in June.  Of note, his blood pressure in the emergency department is 197/135. The rest of his vital signs are normal. Labs are essentially normal except for lipase of 254. The patient has had no radiological exams, but ultrasound of his abdomen is pending.  Review of Systems: The patient denies chest pain, palpitations, difficulty breathing, cough, vomiting, and dysuria. He endorses nausea, mild constipation. All other systems reviewed and found to be negative.  Past Medical History  Diagnosis Date  . Pancreatitis, acute     First episode earlier in 2012, hospitalized again in 02/2012 and 09/2012  . Hypertension      noncompliance  . Hyperlipidemia     noncompliance  . Lung collapse     h/o  . ETOH abuse   . Cocaine abuse   . DTs (delirium tremens)     history of  . Nephrolithiasis   . Tobacco abuse    Past Surgical History  Procedure Laterality Date  . Mandible fracture surgery  2006  . Left axillary      surgery for deep laceration   Social History:  reports that he has been smoking Cigarettes.  He has been smoking about 1.00 pack per day. He does not have any smokeless tobacco history on file. He reports that  drinks alcohol. He reports that he uses illicit drugs (Cocaine) about 3 times per week.he denies using intravenous drugs, he denies using Meth. He lives with a friend. He has been independent with his ADLs.   No Known Allergies  Family History  Problem Relation Age of Onset  . Diabetes type II Mother   . Liver disease Neg Hx   . Colon cancer Neg Hx   . GI problems Neg Hx   He reports that he does not know his family history. He does not keep in contact with his family.  Prior to Admission medications   Medication Sig Start Date End Date Taking? Authorizing Provider  HYDROcodone-acetaminophen (NORCO) 10-325 MG per tablet Take 1-2 tablets by mouth every 4 (four) hours as needed for pain. 06/15/13  Yes Freeman Caldron, PA-C   Physical Exam: Filed Vitals:   07/16/13 1054 07/16/13 1333 07/16/13 1520 07/16/13 1549  BP: 170/134 197/135 192/136 165/122  Pulse: 97 87  86  Temp: 98.1 F (36.7 C)     TempSrc: Oral     Resp: 18 14  16   Height: 6' (1.829 m)     Weight: 77.111 kg (170 lb)     SpO2: 100% 98%  98%     General:  Well-developed thin African--American male, awake alert in no apparent distress  Eyes: pupils are shrunken but equal. Sclera demonstrates no icterus  Neck: Supple, no lymphadenopathy  Cardiovascular: S1, S2, with occasional ectopy; no JVD, no lower extremity edema  Respiratory: clear to auscultation, no accessory muscle use  Abdomen: thin, positive  bowel sounds, no masses, mild epigastric tenderness to palpation, not distended.  Skin: no obvious rashes bruises or lesions.  Musculoskeletal: 5 over 5 strength in each extremity  Psychiatric: awake and alert, flat affect, cooperative ; no signs of tremulousness.  Neurologic: nonfocal cranial nerves II through XII are intact.  Labs on Admission:  Basic Metabolic Panel:  Recent Labs Lab 07/16/13 1141  NA 141  K 3.5  CL 101  CO2 29  GLUCOSE 109*  BUN 6  CREATININE 0.81  CALCIUM 9.4   Liver Function Tests:  Recent Labs Lab 07/16/13 1141  AST 16  ALT 8  ALKPHOS 69  BILITOT 0.8  PROT 7.8  ALBUMIN 3.7    Recent Labs Lab 07/16/13 1141  LIPASE 254*   CBC:  Recent Labs Lab 07/16/13 1141  WBC 5.6  NEUTROABS 3.5  HGB 14.9  HCT 42.1  MCV 96.6  PLT 199    EKG: Independently reviewed. Pending  Assessment/Plan Principal Problem:   Malignant hypertension Active Problems:   Cocaine abuse   Alcohol abuse   Tobacco abuse   Chronic pancreatitis due to acute alcohol intoxication   History of seizures   Syncope  Malignant hypertension   does not appear to be taking BP medications prescribed at discharge in June  Will start scheduled labetalol, clonidine patch, and when necessary hydralazine  Admit to step down unit for close monitoring   Abdominal pain  Differential includes acute on chronic pancreatitis (lipase 254), peptic ulcer disease (daily NSAID use), alcoholic gastropathy, or it could be secondary to his malignant hypertension.  Will treat consistent with therapy for pancreatitis: N.p.o., IV fluids, pain meds, supportive care.  Protonix. IV, Zofran  If he does not improve quickly, EGD may be considered  Syncopal episodes   Uncertain etiology: Polysubstance abuse versus recent head trauma versus severe hypertension  His last CT scan was 06/13/2013 - no follow up notes in EPIC since 6/17.  Curb-sided Neuro PA at [facility] via telephone.  Will  recheck CT head to look for low grade bleeding  Will recheck EEG for seizure activity.  Low threshold to consult neurology if any abnormalities are found.  Polysubstance abuse  CIWA protocol  History of withdraw symptoms in the past  Close monitoring in stepdown.  Social work Administrator, sports.  History of seizures  Started on Keppra 1000 mg bid in June  Doesn't appear that he has been on any medications other than norco  Restart Keppra 1000 mg IV bid  EEG.   Code Status:  full Family Communication:  Disposition Plan: Inpatient  Time spent: 60 min   Elliot Cousin, M.D. [phone])  [place] [facility] Pager [phone]  If 7PM-7AM, please contact night-coverage [email or web address] Password [facility] 07/16/2013, 4:10 PM    Attending:  The patient was seen and examined. He was discussed with PA, Mr. Elyn Peers. CT scan  of his head reveals resolution of subarachnoid and subdural hemorrhage. Ultrasound of the abdomen reveals a suspected gallbladder sludge with no mentioning of the pancreas. The patient has chronic polysubstance abuse, but upon questioning him, he does not believe he has a problem with either alcohol or cocaine. I informed him that both were causing his acute pancreatitis that has been recurrent. He is obviously noncompliant which has led to malignant hypertension. It is likely that his possible syncopal episodes are from a seizure disorder. He has been noncompliant with Keppra as well. We'll continue treatment as above with a few exceptions. Will increase Catapres patch to 0.3 in light of saline load from IV fluids. Will increase his IV hydromorphone to one to 2 mg every 4 hours as needed. We'll make him completely n.p.o. Will increase IV Protonix to every 12 hours. We'll add IV thiamine daily. We'll add a nicotine patch. We'll order tobacco cessation counseling. We'll consult social worker for substance abuse counseling.

## 2013-07-17 ENCOUNTER — Encounter (HOSPITAL_COMMUNITY): Payer: Self-pay | Admitting: Internal Medicine

## 2013-07-17 DIAGNOSIS — F17200 Nicotine dependence, unspecified, uncomplicated: Secondary | ICD-10-CM

## 2013-07-17 DIAGNOSIS — R55 Syncope and collapse: Secondary | ICD-10-CM

## 2013-07-17 DIAGNOSIS — G40909 Epilepsy, unspecified, not intractable, without status epilepticus: Secondary | ICD-10-CM

## 2013-07-17 DIAGNOSIS — Z9119 Patient's noncompliance with other medical treatment and regimen: Secondary | ICD-10-CM

## 2013-07-17 DIAGNOSIS — Z91199 Patient's noncompliance with other medical treatment and regimen due to unspecified reason: Secondary | ICD-10-CM

## 2013-07-17 HISTORY — DX: Patient's noncompliance with other medical treatment and regimen due to unspecified reason: Z91.199

## 2013-07-17 HISTORY — DX: Epilepsy, unspecified, not intractable, without status epilepticus: G40.909

## 2013-07-17 HISTORY — DX: Patient's noncompliance with other medical treatment and regimen: Z91.19

## 2013-07-17 LAB — COMPREHENSIVE METABOLIC PANEL
ALT: 6 U/L (ref 0–53)
Alkaline Phosphatase: 59 U/L (ref 39–117)
BUN: 6 mg/dL (ref 6–23)
CO2: 27 mEq/L (ref 19–32)
Calcium: 8.6 mg/dL (ref 8.4–10.5)
GFR calc Af Amer: >90 mL/min (ref >90)
GFR calc non Af Amer: >90 mL/min (ref >90)
Glucose, Bld: 94 mg/dL (ref 70–99)
Sodium: 140 mEq/L (ref 135–145)
Total Protein: 6.2 g/dL (ref 6.0–8.3)

## 2013-07-17 LAB — CBC
HCT: 37.6 % — ABNORMAL LOW (ref 39.0–52.0)
Hemoglobin: 13.1 g/dL (ref 13.0–17.0)
MCHC: 34.8 g/dL (ref 30.0–36.0)
RDW: 13.4 % (ref 11.5–15.5)
WBC: 4.1 10*3/uL (ref 4.0–10.5)

## 2013-07-17 LAB — LIPASE, BLOOD: Lipase: 155 U/L — ABNORMAL HIGH (ref 11–59)

## 2013-07-17 LAB — LIPID PANEL: Cholesterol: 139 mg/dL (ref 0–200)

## 2013-07-17 MED ORDER — POTASSIUM CHLORIDE IN NACL 40-0.9 MEQ/L-% IV SOLN
INTRAVENOUS | Status: DC
  Administered 2013-07-17 – 2013-07-20 (×5): via INTRAVENOUS

## 2013-07-17 NOTE — Progress Notes (Signed)
[facility] PROGRESS NOTE  Paul [name] ZOX:[id number] DOB: [birth year] DOA: 07/16/2013 PCP: No PCP Per Patient    Code Status: Full code Family Communication: No family available Disposition Plan: Discharge to home when clinically improved   Consultants:  None  Procedures:  None  Antibiotics:  None  Subjective: The patient's abdominal pain is 3-4/10, down from 8/10 last night. Analgesics are helping. He denies vomiting but has had some nausea. No recent bowel movement.    Objective: Filed Vitals:   07/16/13 2300 07/17/13 0344 07/17/13 0500 07/17/13 1110  BP: 184/129 162/106 141/106   Pulse:  90 85 80  Temp:   97.9 F (36.6 C)   TempSrc:   Oral   Resp:   18   Height:      Weight:      SpO2:   98% 100%    Intake/Output Summary (Last 24 hours) at 07/17/13 1155 Last data filed at 07/17/13 0900  Gross per 24 hour  Intake      0 ml  Output      0 ml  Net      0 ml   Filed Weights   07/16/13 1054  Weight: 77.111 kg (170 lb)    Exam:   General:  Alert 47 year old African-American man laying in bed, in no acute distress.  Cardiovascular: S1, S2, with a soft systolic murmur.  Respiratory: Clear to auscultation bilaterally.  Abdomen: Positive bowel sounds, soft, mildly tender in the epigastric region. (Recent IV Dilaudid given). No masses palpated, no distention, no hepatosplenomegaly.  Musculoskeletal: No acute hot red joints. No pedal edema.  Neurologic: He is alert and oriented x3. Cranial nerves II through XII are intact. Strength is 5 over 5 throughout. Sensation is intact throughout.  Psych: He is pleasant. His speech is clear. No signs of tremulousness. He does not believe he has a problem with abusing alcohol or illicit drugs.   Data Reviewed: Basic Metabolic Panel:  Recent Labs Lab 07/16/13 1141 07/17/13 0558  NA 141 140  K 3.5 3.2*  CL 101 105  CO2 29 27  GLUCOSE 109* 94  BUN 6 6  CREATININE 0.81 0.74  CALCIUM 9.4 8.6    Liver Function Tests:  Recent Labs Lab 07/16/13 1141 07/17/13 0558  AST 16 12  ALT 8 6  ALKPHOS 69 59  BILITOT 0.8 0.9  PROT 7.8 6.2  ALBUMIN 3.7 2.9*    Recent Labs Lab 07/16/13 1141 07/17/13 0558  LIPASE 254* 155*   No results found for this basename: AMMONIA,  in the last 168 hours CBC:  Recent Labs Lab 07/16/13 1141 07/17/13 0558  WBC 5.6 4.1  NEUTROABS 3.5  --   HGB 14.9 13.1  HCT 42.1 37.6*  MCV 96.6 97.4  PLT 199 154   Cardiac Enzymes: No results found for this basename: CKTOTAL, CKMB, CKMBINDEX, TROPONINI,  in the last 168 hours BNP (last 3 results) No results found for this basename: PROBNP,  in the last 8760 hours CBG: No results found for this basename: GLUCAP,  in the last 168 hours  No results found for this or any previous visit (from the past 240 hour(s)).   Studies: Ct Head Wo Contrast  07/16/2013   *RADIOLOGY REPORT*  Clinical Data: Head trauma, intracranial hemorrhage 4 weeks ago. Hypertension, blackouts, altered mental status  CT HEAD WITHOUT CONTRAST  Technique:  Contiguous axial images were obtained from the base of the skull through the vertex without contrast.  Comparison: 06/12/2013, 06/13/2013  Findings: Resolved subarachnoid and subdural hemorrhage compared to the prior study.  No new intracranial hemorrhage, mass effect, mass lesion, definite acute infarction, midline shift, herniation or hydrocephalus.  The inferior frontal lobes demonstrate white matter hypoattenuation diffusely, images 8-11 compatible with evolutionary changes from bifrontal closed head injury.  The cisterns are patent.  No cerebellar abnormality.  No extra-axial fluid collection.  Mastoids and sinuses are clear.  No skull abnormality.  IMPRESSION: Resolved subarachnoid and subdural hemorrhage compared to 06/12/2013.  Evolutionary changes in the inferior bifrontal white matter related to the closed head injury.  No current acute process by noncontrast CT.   Original  Report Authenticated By: Judie Petit. Miles Costain, M.D.   US Abdomen Limited Ruq  07/16/2013   *RADIOLOGY REPORT*  Clinical Data:  Abdominal pain, evaluate for acute cholecystitis, history of hypertension and hyperlipidemia  LIMITED ABDOMINAL ULTRASOUND - RIGHT UPPER QUADRANT  Comparison:  CT abdomen pelvis - 11/05/2012  Findings:  Gallbladder:  There is a minimal amount of layering echogenic debris (images 48 and 77) within an otherwise normal-appearing gallbladder.  No gallbladder wall thickening or pericholecystic fluid.  Negative sonographic Murphy's sign.  Common bile duct:  Normal in size measuring 5.6 mm in diameter  Liver:  No focal mass lesion seen.  Within normal limits in parenchymal echogenicity.  IMPRESSION: Suspected gallbladder sludge with an otherwise normal-appearing gallbladder.  Further evaluation may be obtained with a HIDA scan as clinically indicated.                    Original Report Authenticated By: Tacey Ruiz, MD    Scheduled Meds: . cloNIDine  0.3 mg Transdermal Weekly  . enoxaparin (LOVENOX) injection  40 mg Subcutaneous Q24H  . labetalol  5 mg Intravenous Q4H  . levETIRAcetam  1,000 mg Intravenous Q12H  . nicotine  21 mg Transdermal Daily  . pantoprazole (PROTONIX) IV  40 mg Intravenous Q12H  . thiamine  100 mg Intravenous Daily   Continuous Infusions: . 0.9 % NaCl with KCl 40 mEq / L 100 mL/hr at 07/17/13 1108     Assessment:  Principal Problem:   Pancreatitis, acute Active Problems:   Cocaine abuse   Alcohol abuse   Tobacco abuse   Malignant hypertension   Syncope   Seizure disorder   Noncompliance   1. 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 His lipase is improving. He is slightly less symptomatic but he is still requiring IV hydromorphone. We'll continue bowel rest, IV PPI, when necessary IV Zofran, and IV analgesics as needed.  Malignant hypertension. He is noncompliant. Overall, his blood pressure is improving on Catapres patch and  scheduled IV labetalol.  Polysubstance abuse with alcohol/cocaine/and tobacco. The patient does not believe he has a dependency or addiction to alcohol or cocaine. He appears to have no interest in trying to stop although I informed him that it is likely that his substance abuse is causing recurrent pancreatitis and possibly exacerbating his seizure disorder and blackout spells. We'll continue thiamine, nicotine patch, and Ativan withdrawal protocol  Seizure disorder. It is not clear if he has alcohol withdrawal seizures or alcohol-induced seizures. He has been noncompliant with anticonvulsant therapy. We'll continue Keppra IV.   Reported syncopal episodes/"blackouts". These episodes may be related to seizures or intoxication from drugs. He is currently neurologically stable.  Recent history of traumatic intracranial hemorrhage/subdural hematoma. Followup CT of the head reveals resolution of bleeding.  Mild hypokalemia.     Plan: 1. Add more potassium  to the IV fluids. 2. We'll check a blood magnesium level to rule out deficiency. 3. Substance abuse counseling with social worker pending. Tobacco cessation counseling ordered. 4. Allow ice chips and sips of clear liquids. 5. Followup on lipase and other laboratory studies in the morning.      Time spent: 30 minutes    [facility]  [facility] Pager [phone]. If 7PM-7AM, please contact night-coverage at [email or web address], password [facility] 07/17/2013, 11:55 AM  LOS: 1 day

## 2013-07-18 DIAGNOSIS — D61818 Other pancytopenia: Secondary | ICD-10-CM | POA: Diagnosis not present

## 2013-07-18 DIAGNOSIS — I10 Essential (primary) hypertension: Secondary | ICD-10-CM

## 2013-07-18 LAB — CBC
HCT: 34.6 % — ABNORMAL LOW (ref 39.0–52.0)
MCH: 33.5 pg (ref 26.0–34.0)
MCHC: 34.1 g/dL (ref 30.0–36.0)
RDW: 13.6 % (ref 11.5–15.5)

## 2013-07-18 LAB — COMPREHENSIVE METABOLIC PANEL
ALT: 7 U/L (ref 0–53)
AST: 15 U/L (ref 0–37)
Alkaline Phosphatase: 52 U/L (ref 39–117)
CO2: 26 mEq/L (ref 19–32)
GFR calc Af Amer: >90 mL/min (ref >90)
GFR calc non Af Amer: >90 mL/min (ref >90)
Glucose, Bld: 85 mg/dL (ref 70–99)
Potassium: 3.6 mEq/L (ref 3.5–5.1)
Sodium: 134 mEq/L — ABNORMAL LOW (ref 135–145)

## 2013-07-18 MED ORDER — LABETALOL HCL 200 MG PO TABS
100.0000 mg | ORAL_TABLET | Freq: Three times a day (TID) | ORAL | Status: DC
  Administered 2013-07-18 – 2013-07-20 (×7): 100 mg via ORAL
  Filled 2013-07-18 (×7): qty 1

## 2013-07-18 MED ORDER — VITAMIN B-1 100 MG PO TABS
100.0000 mg | ORAL_TABLET | Freq: Every day | ORAL | Status: DC
  Administered 2013-07-19 – 2013-07-20 (×2): 100 mg via ORAL
  Filled 2013-07-18 (×2): qty 1

## 2013-07-18 MED ORDER — HYDROMORPHONE HCL PF 1 MG/ML IJ SOLN
1.0000 mg | INTRAMUSCULAR | Status: DC | PRN
  Administered 2013-07-18 – 2013-07-20 (×14): 1 mg via INTRAVENOUS
  Filled 2013-07-18 (×8): qty 1
  Filled 2013-07-18: qty 2
  Filled 2013-07-18 (×6): qty 1

## 2013-07-18 MED ORDER — LABETALOL HCL 5 MG/ML IV SOLN
5.0000 mg | INTRAVENOUS | Status: DC | PRN
  Filled 2013-07-18: qty 4

## 2013-07-18 MED ORDER — CLONIDINE HCL 0.3 MG/24HR TD PTWK
0.3000 mg | MEDICATED_PATCH | TRANSDERMAL | Status: DC
  Administered 2013-07-18: 0.3 mg via TRANSDERMAL
  Filled 2013-07-18: qty 1

## 2013-07-18 NOTE — Progress Notes (Signed)
[facility] PROGRESS NOTE  Paul [name] TFT:[id number] DOB: [birth year] DOA: 07/16/2013 PCP: No PCP Per Patient    Code Status: Full code Family Communication: No family available Disposition Plan: Discharge to home when clinically improved   Consultants:  None  Procedures:  None  Antibiotics:  None  Subjective: The patient stated that he had a "rough" night. Mild nausea and more abdominal pain. Pain medication is helping but is not lasting.    Objective: Filed Vitals:   07/17/13 2111 07/18/13 0544 07/18/13 1303 07/18/13 1404  BP: 139/97 143/97 178/117 186/128  Pulse: 82 74 84 86  Temp: 98 F (36.7 C) 98.2 F (36.8 C)  98.1 F (36.7 C)  TempSrc: Oral Oral    Resp: 18 18  18   Height:      Weight:      SpO2: 99% 98%  97%   No intake or output data in the 24 hours ending 07/18/13 1453 Filed Weights   07/16/13 1054  Weight: 77.111 kg (170 lb)    Exam:   General:  Alert 47 year old African-American man laying in bed, in no acute distress.  Cardiovascular: S1, S2, with a soft systolic murmur.  Respiratory: Clear to auscultation bilaterally.  Abdomen: Positive bowel sounds, soft, mildly tender in the epigastric region. No masses palpated, no distention, no hepatosplenomegaly.  Musculoskeletal: No acute hot red joints. No pedal edema.  Neurologic: He is alert and oriented x3. Cranial nerves II through XII are intact. Strength is 5 over 5 throughout. Sensation is intact throughout.  Psych: He is pleasant. His speech is clear. No signs of tremulousness. He does not believe he has a problem with abusing alcohol or illicit drugs.   Data Reviewed: Basic Metabolic Panel:  Recent Labs Lab 07/16/13 1141 07/17/13 0558 07/18/13 0547  NA 141 140 134*  K 3.5 3.2* 3.6  CL 101 105 101  CO2 29 27 26   GLUCOSE 109* 94 85  BUN 6 6 5*  CREATININE 0.81 0.74 0.82  CALCIUM 9.4 8.6 8.6   Liver Function Tests:  Recent Labs Lab 07/16/13 1141  07/17/13 0558 07/18/13 0547  AST 16 12 15   ALT 8 6 7   ALKPHOS 69 59 52  BILITOT 0.8 0.9 1.5*  PROT 7.8 6.2 5.8*  ALBUMIN 3.7 2.9* 2.7*    Recent Labs Lab 07/16/13 1141 07/17/13 0558 07/18/13 0547  LIPASE 254* 155* 237*   No results found for this basename: AMMONIA,  in the last 168 hours CBC:  Recent Labs Lab 07/16/13 1141 07/17/13 0558 07/18/13 0547  WBC 5.6 4.1 3.5*  NEUTROABS 3.5  --   --   HGB 14.9 13.1 11.8*  HCT 42.1 37.6* 34.6*  MCV 96.6 97.4 98.3  PLT 199 154 140*   Cardiac Enzymes: No results found for this basename: CKTOTAL, CKMB, CKMBINDEX, TROPONINI,  in the last 168 hours BNP (last 3 results) No results found for this basename: PROBNP,  in the last 8760 hours CBG: No results found for this basename: GLUCAP,  in the last 168 hours  No results found for this or any previous visit (from the past 240 hour(s)).   Studies: Ct Head Wo Contrast  07/16/2013   *RADIOLOGY REPORT*  Clinical Data: Head trauma, intracranial hemorrhage 4 weeks ago. Hypertension, blackouts, altered mental status  CT HEAD WITHOUT CONTRAST  Technique:  Contiguous axial images were obtained from the base of the skull through the vertex without contrast.  Comparison: 06/12/2013, 06/13/2013  Findings: Resolved subarachnoid and subdural hemorrhage compared  to the prior study.  No new intracranial hemorrhage, mass effect, mass lesion, definite acute infarction, midline shift, herniation or hydrocephalus.  The inferior frontal lobes demonstrate white matter hypoattenuation diffusely, images 8-11 compatible with evolutionary changes from bifrontal closed head injury.  The cisterns are patent.  No cerebellar abnormality.  No extra-axial fluid collection.  Mastoids and sinuses are clear.  No skull abnormality.  IMPRESSION: Resolved subarachnoid and subdural hemorrhage compared to 06/12/2013.  Evolutionary changes in the inferior bifrontal white matter related to the closed head injury.  No current acute  process by noncontrast CT.   Original Report Authenticated By: Judie Petit. Miles Costain, M.D.   US Abdomen Limited Ruq  07/16/2013   *RADIOLOGY REPORT*  Clinical Data:  Abdominal pain, evaluate for acute cholecystitis, history of hypertension and hyperlipidemia  LIMITED ABDOMINAL ULTRASOUND - RIGHT UPPER QUADRANT  Comparison:  CT abdomen pelvis - 11/05/2012  Findings:  Gallbladder:  There is a minimal amount of layering echogenic debris (images 48 and 77) within an otherwise normal-appearing gallbladder.  No gallbladder wall thickening or pericholecystic fluid.  Negative sonographic Murphy's sign.  Common bile duct:  Normal in size measuring 5.6 mm in diameter  Liver:  No focal mass lesion seen.  Within normal limits in parenchymal echogenicity.  IMPRESSION: Suspected gallbladder sludge with an otherwise normal-appearing gallbladder.  Further evaluation may be obtained with a HIDA scan as clinically indicated.                    Original Report Authenticated By: Tacey Ruiz, MD    Scheduled Meds: . cloNIDine  0.3 mg Transdermal Weekly  . enoxaparin (LOVENOX) injection  40 mg Subcutaneous Q24H  . labetalol  5 mg Intravenous Q4H  . levETIRAcetam  1,000 mg Intravenous Q12H  . nicotine  21 mg Transdermal Daily  . pantoprazole (PROTONIX) IV  40 mg Intravenous Q12H  . thiamine  100 mg Intravenous Daily   Continuous Infusions: . 0.9 % NaCl with KCl 40 mEq / L 100 mL/hr at 07/18/13 0932     Assessment:  Principal Problem:   Pancreatitis, acute Active Problems:   Cocaine abuse   Alcohol abuse   Tobacco abuse   Malignant hypertension   Syncope   Seizure disorder   Noncompliance   1. 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 His lipase has vacillated but overall improved. We'll sips and ice chips, IV PPI, when necessary IV Zofran, and IV analgesics as needed. We'll increase the frequency of IV hydromorphone to every 3 hours as needed  Malignant hypertension. Accelerated blood  pressure may be exacerbated by saline load and pain. He is noncompliant. We'll continue Catapres patch and when necessary IV labetalol. We'll add 3 times a day oral labetalol.  Polysubstance abuse with alcohol/cocaine/and tobacco. The patient does not believe he has a dependency or addiction to alcohol or cocaine. He appears to have no interest in trying to stop although I informed him that it is likely that his substance abuse is causing recurrent pancreatitis and possibly exacerbating his seizure disorder and blackout spells. We'll continue thiamine, nicotine patch, and Ativan withdrawal protocol  Seizure disorder. It is not clear if he has alcohol withdrawal seizures or alcohol-induced seizures. He has been noncompliant with anticonvulsant therapy. We'll continue Keppra IV.   Reported syncopal episodes/"blackouts". These episodes may be related to seizures or intoxication from drugs. He is currently neurologically stable.  Recent history of traumatic intracranial hemorrhage/subdural hematoma. Followup CT of the head reveals resolution of  bleeding.  Mild hypokalemia. Repleting in IV fluids.  Pancytopenia. Decreases in his WBC platelet count, and hemoglobin are likely dilutional in origin. His BUN is also low, likely secondary to dilutional effects of IV fluids.    Plan:  1. When necessary IV Dilaudid frequency increased to every 3 hours.  2. Decreased IV fluids. 3. Add oral labetalol 3 times a day.     Time spent: 30 minutes    [facility]  [facility] Pager [phone]. If 7PM-7AM, please contact night-coverage at [email or web address], password [facility] 07/18/2013, 2:53 PM  LOS: 2 days

## 2013-07-18 NOTE — Progress Notes (Signed)
Patients BP elevated, clonidine patch fell off during bathing earlier today.  Order changed in order to place new clonidine patch now.  Patch placed to patients L arm.  Will continue to monitor BP, oncoming nurse made aware.

## 2013-07-19 ENCOUNTER — Ambulatory Visit: Payer: MEDICAID | Admitting: Diagnostic Neuroimaging

## 2013-07-19 DIAGNOSIS — G40909 Epilepsy, unspecified, not intractable, without status epilepticus: Secondary | ICD-10-CM

## 2013-07-19 LAB — BASIC METABOLIC PANEL
Calcium: 8.8 mg/dL (ref 8.4–10.5)
Creatinine, Ser: 0.82 mg/dL (ref 0.50–1.35)
GFR calc non Af Amer: >90 mL/min (ref >90)
Glucose, Bld: 78 mg/dL (ref 70–99)
Sodium: 136 mEq/L (ref 135–145)

## 2013-07-19 LAB — CBC
MCH: 33.5 pg (ref 26.0–34.0)
MCV: 97.1 fL (ref 78.0–100.0)
Platelets: 159 10*3/uL (ref 150–400)
RBC: 3.73 MIL/uL — ABNORMAL LOW (ref 4.22–5.81)
RDW: 13.2 % (ref 11.5–15.5)

## 2013-07-19 LAB — LIPASE, BLOOD: Lipase: 259 U/L — ABNORMAL HIGH (ref 11–59)

## 2013-07-19 MED ORDER — LEVETIRACETAM 500 MG PO TABS: 1000.0000 mg | ORAL_TABLET | Freq: Two times a day (BID) | ORAL | Status: DC

## 2013-07-19 MED ORDER — POLYETHYLENE GLYCOL 3350 17 G PO PACK
17.0000 g | PACK | Freq: Every day | ORAL | Status: DC
  Administered 2013-07-20: 17 g via ORAL
  Filled 2013-07-19: qty 1

## 2013-07-19 MED ORDER — LEVETIRACETAM 500 MG PO TABS: 750.0000 mg | ORAL_TABLET | Freq: Two times a day (BID) | ORAL | Status: DC

## 2013-07-19 MED ORDER — LEVETIRACETAM 500 MG PO TABS
750.0000 mg | ORAL_TABLET | Freq: Two times a day (BID) | ORAL | Status: DC
  Administered 2013-07-19 – 2013-07-20 (×2): 750 mg via ORAL
  Filled 2013-07-19 (×2): qty 2

## 2013-07-19 NOTE — Progress Notes (Signed)
UR chart review completed.  

## 2013-07-19 NOTE — Care Management Note (Unsigned)
    Page 1 of 1   07/19/2013     1:33:02 PM   CARE MANAGEMENT NOTE 07/19/2013  Patient:  Paul [name], Paul [name]   Account Number:  [id number]  Date Initiated:  07/19/2013  Documentation initiated by:  Sharrie Rothman  Subjective/Objective Assessment:   Pt admitted from home with pancreatitis. Pt lives with [name] friend and will return home at discharge. Pt is independent with ADL's. Pt has no insurance or PCP.     Action/Plan:   Pt follow up appt made at [facility]. Will assist with medications if not helped in the last year. Lubertha Basque notified of pt self pay status.   Anticipated DC Date:  07/21/2013   Anticipated DC Plan:  HOME/SELF CARE  In-house referral  Financial Counselor      DC Planning Services  CM consult  MATCH Program      Choice offered to / List presented to:             Status of service:  In process, will continue to follow Medicare Important Message given?   (If response is "NO", the following Medicare IM given date fields will be blank) Date Medicare IM given:   Date Additional Medicare IM given:    Discharge Disposition:    Per UR Regulation:    If discussed at Long Length of Stay Meetings, dates discussed:    Comments:  07/19/13 1330 Arlyss Queen, RN BSN CM

## 2013-07-19 NOTE — Progress Notes (Addendum)
[facility] PROGRESS NOTE  Paul [name] VQQ:[id number] DOB: 1966/05/24 DOA: 07/16/2013 PCP: No PCP Per Patient    Code Status: Full code Family Communication: No family available Disposition Plan: Discharge to home when clinically improved   Consultants:  None  Procedures:  None  Antibiotics:  None  Subjective: The patient says he felt a little dizzy this morning. He is still having some abdominal pain, but less. He tolerated clear liquids this morning without no significant increase in pain. He denies nausea or vomiting. He has not had a bowel movement in several days.    Objective: Filed Vitals:   07/18/13 1759 07/18/13 1945 07/18/13 2233 07/19/13 0637  BP: 175/121  152/101 157/111  Pulse: 85 89 86 85  Temp:   98.2 F (36.8 C) 98.3 F (36.8 C)  TempSrc:      Resp:   20 20  Height:      Weight:      SpO2:   98% 98%    Intake/Output Summary (Last 24 hours) at 07/19/13 1211 Last data filed at 07/18/13 1730  Gross per 24 hour  Intake      0 ml  Output      0 ml  Net      0 ml   Filed Weights   07/16/13 1054  Weight: 77.111 kg (170 lb)    Exam:   General:  Alert 47 year old African-American man laying in bed, in no acute distress.  Cardiovascular: S1, S2, with a soft systolic murmur.  Respiratory: Clear to auscultation bilaterally.  Abdomen: Positive bowel sounds, soft, mildly tender in the right lower quadrant. No masses palpated, no distention, no hepatosplenomegaly.  Musculoskeletal: No acute hot red joints. No pedal edema.  Neurologic: He is alert and oriented x3. Cranial nerves II through XII are intact. Strength is 5 over 5 throughout. Sensation is intact throughout.  Psych: He is pleasant. His speech is clear. No signs of tremulousness. He does not believe he has a problem with abusing alcohol or illicit drugs.   Data Reviewed: Basic Metabolic Panel:  Recent Labs Lab 07/16/13 1141 07/17/13 0558 07/18/13 0547 07/19/13 0605   NA 141 140 134* 136  K 3.5 3.2* 3.6 3.8  CL 101 105 101 103  CO2 29 27 26 24   GLUCOSE 109* 94 85 78  BUN 6 6 5* 4*  CREATININE 0.81 0.74 0.82 0.82  CALCIUM 9.4 8.6 8.6 8.8   Liver Function Tests:  Recent Labs Lab 07/16/13 1141 07/17/13 0558 07/18/13 0547  AST 16 12 15   ALT 8 6 7   ALKPHOS 69 59 52  BILITOT 0.8 0.9 1.5*  PROT 7.8 6.2 5.8*  ALBUMIN 3.7 2.9* 2.7*    Recent Labs Lab 07/16/13 1141 07/17/13 0558 07/18/13 0547 07/19/13 0605  LIPASE 254* 155* 237* 259*   No results found for this basename: AMMONIA,  in the last 168 hours CBC:  Recent Labs Lab 07/16/13 1141 07/17/13 0558 07/18/13 0547 07/19/13 0605  WBC 5.6 4.1 3.5* 3.1*  NEUTROABS 3.5  --   --   --   HGB 14.9 13.1 11.8* 12.5*  HCT 42.1 37.6* 34.6* 36.2*  MCV 96.6 97.4 98.3 97.1  PLT 199 154 140* 159   Cardiac Enzymes: No results found for this basename: CKTOTAL, CKMB, CKMBINDEX, TROPONINI,  in the last 168 hours BNP (last 3 results) No results found for this basename: PROBNP,  in the last 8760 hours CBG: No results found for this basename: GLUCAP,  in the last  168 hours  No results found for this or any previous visit (from the past 240 hour(s)).   Studies: No results found.  Scheduled Meds: . cloNIDine  0.3 mg Transdermal Weekly  . enoxaparin (LOVENOX) injection  40 mg Subcutaneous Q24H  . labetalol  100 mg Oral TID  . levETIRAcetam  1,000 mg Oral BID  . nicotine  21 mg Transdermal Daily  . pantoprazole (PROTONIX) IV  40 mg Intravenous Q12H  . thiamine  100 mg Oral Daily   Continuous Infusions: . 0.9 % NaCl with KCl 40 mEq / L 70 mL/hr at 07/18/13 1457     Assessment:  Principal Problem:   Pancreatitis, acute Active Problems:   Cocaine abuse   Alcohol abuse   Tobacco abuse   Malignant hypertension   Syncope   Seizure disorder   Noncompliance   Other pancytopenia   1. 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 His lipase has vacillated but  overall improved. Clear liquids started. Will transition some medications to by mouth. Continue IV proton X., when necessary IV Zofran, and IV analgesics as needed. We'll add MiraLax. We'll add Pancrease.  Mild hyperbilirubinemia. This will be monitored. Ultrasound of his abdomen on 07/16/2013 revealed gallbladder sludge.  Malignant hypertension. Accelerated blood pressure may be exacerbated by saline load and pain. He has been noncompliant with his antihypertensive medications. We'll continue Catapres patch and when necessary IV labetalol. Added 3 times a day oral labetalol.  Polysubstance abuse with alcohol/cocaine/and tobacco. The patient does not believe he has a dependency or addiction to alcohol or cocaine. He appears to have no interest in trying to stop although I informed him that it is likely that his substance abuse is causing recurrent pancreatitis and possibly exacerbating his seizure disorder and blackout spells. We'll continue thiamine, nicotine patch, and Ativan withdrawal protocol. Social worker, Ms. Cunningham's assessment and plan noted and appreciated.  Seizure disorder. It is not clear if he has alcohol withdrawal seizures or alcohol-induced seizures. He has been noncompliant with anticonvulsant therapy. We'll continue Keppra, but will transition to oral preparation. Discontinue IV Keppra. I wonder if 2 g of Keppra is more than he needs.  Reported syncopal episodes/"blackouts". These episodes may be related to seizures or intoxication from drugs. He is currently neurologically stable. No evidence of syncope or presyncope during hospitalization.  Recent history of traumatic intracranial hemorrhage/subdural hematoma. Followup CT of the head reveals resolution of bleeding.  Mild hypokalemia. Repleting in IV fluids.  Pancytopenia. Decreases in his WBC platelet count, and hemoglobin are likely dilutional in origin, but could also be secondary to chronic alcohol abuse. His BUN is also  low, likely secondary to dilutional effects of IV fluids. IV fluids were decreased.    Plan:  1. Continue clear liquid diet without advancement. 2. MiraLax for possible constipation. 3. Consider adding pancreatic enzyme replacement therapy. 4. Keppra changed to by mouth. Consider decreasing dose. May get curbside consult with neurology. 4. Check LFTs in the morning.     Time spent: 30 minutes    [facility]  [facility] Pager [phone]. If 7PM-7AM, please contact night-coverage at [email or web address], password [facility] 07/19/2013, 12:11 PM  LOS: 3 days
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
## 2013-07-20 ENCOUNTER — Other Ambulatory Visit (HOSPITAL_COMMUNITY): Payer: Self-pay

## 2013-07-20 LAB — HEPATIC FUNCTION PANEL
ALT: 7 U/L (ref 0–53)
AST: 13 U/L (ref 0–37)
Albumin: 3 g/dL — ABNORMAL LOW (ref 3.5–5.2)
Indirect Bilirubin: 0.8 mg/dL (ref 0.3–0.9)
Total Protein: 6.7 g/dL (ref 6.0–8.3)

## 2013-07-20 LAB — BASIC METABOLIC PANEL
CO2: 27 mEq/L (ref 19–32)
Chloride: 103 mEq/L (ref 96–112)
Creatinine, Ser: 0.81 mg/dL (ref 0.50–1.35)
Glucose, Bld: 106 mg/dL — ABNORMAL HIGH (ref 70–99)

## 2013-07-20 LAB — LIPASE, BLOOD: Lipase: 251 U/L — ABNORMAL HIGH (ref 11–59)

## 2013-07-20 MED ORDER — HYDROCODONE-ACETAMINOPHEN 10-325 MG PO TABS: 1.0000 | ORAL_TABLET | ORAL | Status: DC | PRN

## 2013-07-20 MED ORDER — CLONIDINE HCL 0.3 MG/24HR TD PTWK
0.3000 mg | MEDICATED_PATCH | TRANSDERMAL | Status: DC
  Administered 2013-07-20: 0.3 mg via TRANSDERMAL

## 2013-07-20 MED ORDER — LEVETIRACETAM 750 MG PO TABS: 750.0000 mg | ORAL_TABLET | Freq: Two times a day (BID) | ORAL | Status: DC

## 2013-07-20 MED ORDER — OXYCODONE-ACETAMINOPHEN 5-325 MG PO TABS
1.0000 | ORAL_TABLET | ORAL | Status: DC | PRN
  Administered 2013-07-20: 2 via ORAL
  Filled 2013-07-20: qty 2

## 2013-07-20 MED ORDER — OMEPRAZOLE 20 MG PO CPDR: 20.0000 mg | DELAYED_RELEASE_CAPSULE | Freq: Every day | ORAL | Status: DC

## 2013-07-20 MED ORDER — CLONIDINE HCL 0.2 MG PO TABS: 0.2000 mg | ORAL_TABLET | Freq: Two times a day (BID) | ORAL | Status: DC

## 2013-07-20 NOTE — Progress Notes (Signed)
Pt discharged home today per Dr. Sherrie Mustache. Pt's IV site D/C'd and WNL. Pt's VS stable at this time. Pt provided with home medication list, discharge instructions, prescriptions. Verbalized understanding. Pt states he is to call his ride.  Will make night shift RN aware of arrangements. Pt is to leave floor with assistance from staff.  Made aware of this. Verbalized understanding.

## 2013-07-20 NOTE — Discharge Summary (Signed)
Physician Discharge Summary  Paul [name]:[id number] DOB: [birth year] DOA: 07/16/2013  PCP: No PCP Per Patient  Admit date: 07/16/2013 Discharge date: 07/20/2013  Time spent: Greater than 30  minutes  Recommendations for Outpatient Follow-up:  1. The patient was advised to followup at the health department for continuity of care. He was strongly advised to stop abusing cocaine and drinking alcohol. Outpatient resources discussed with him by the social worker.  Discharge Diagnoses:   1. Recurrent acute pancreatitis, alcohol and cocaine induced. 2. Alcohol abuse, tobacco, and cocaine abuse. The patient does not admit to abuse and is not willing to stop drinking alcohol were using cocaine. He was encouraged to stop smoking. 3. Malignant hypertension. 4. Seizure disorder. 5. Reported syncope, possibly related to seizures or alcohol/cocaine. 6. Pancytopenia, secondary to alcohol abuse and hemodilution from IV fluids. 7. History of recent traumatic cerebral hemorrhage and skull fracture following a fall, treated at [name] June 2014. Followup CT of his head during this hospitalization revealed resolution of the bleeding. 8. Noncompliance.  Discharge Condition: Improved  Diet recommendation: Low-fat  Filed Weights   07/16/13 1054  Weight: 77.111 kg (170 lb)    History of present illness:   Paul [name] is a 47 y.o. male with a past medical history significant for polysubstance abuse, pancreatitis, noncompliance, hypertension. He was discharged from [facility] on 06/15/2013 after being treated for traumatic injury resulting in cerebral hemorrhage and open skull fracture from a ? fall. During that hospitalization he also suffered convulsions and seizures. Neurology was uncertain about whether they were from substance abuse or head trauma or both.  He presented to the [facility] ED complaining of abdominal pain that has become worse over the past week. It felt similar to his  pancreatitis pain. He had been nauseated and has been unable to vomit and unable to eat since yesterday. He reported that he last used cocaine June 17. He drinks daily both beer and hard liquor. When asked to quantify his drinking he replied "as much as I can". He reported taking NSAIDs daily for stomach pain. Further he complained of blackout spells that have happened to him approximately 2 or 3 times a week since his traumatic accident in June. Of note, his blood pressure in the emergency department was 197/135. The rest of his vital signs were normal. Labs were essentially normal except for his lipase of 254. The patient has had no radiological exams, but ultrasound of his abdomen was pending in the ED.   Hospital Course:   Acute pancreatitis, likely cocaine and alcohol induced.  The patient was started on bowel rest, IV fluids, IV Protonix, as needed IV antiemetics and as needed IV hydromorphone. His triglyceride level was within normal limits.  His lipase vacillated but overall improved. Clear liquids were started. He tolerated the clear liquids well, so his diet was advanced to a low-fat diet. He ate all of his lunch and dinner meals with no increase in pain, nausea, and vomiting. His lipase level was still in the 200s, but he was clearly symptomatically improved. He was discharged to home with instructions to stop drinking and to follow a low-fat diet.   Mild hyperbilirubinemia.  Ultrasound of his abdomen on 07/16/2013 revealed gallbladder sludge. The hyperbilirubinemia resolved.  Malignant hypertension. The patient has a known history of malignant hypertension. He continues to be noncompliant. For treatment of his accelerated blood pressure, Catapres patch and IV labetalol were given while he was n.p.o. IV fluids exacerbated his  high blood pressure some. When he was able to eat, IV fluids were tapered down. He was started on oral labetalol in addition to the Catapres patch. His blood pressure  improved. He was discharged on twice a day dosing of clonidine to try to improve compliance and because he would be able to get clonidine at [facility] for $4.    Polysubstance abuse with alcohol/cocaine/and tobacco.  The patient did not believe he had a dependency or addiction to alcohol or cocaine. He appeared to have no interest in trying to stop although I informed him that it is likely that his substance abuse was causing recurrent pancreatitis and possibly exacerbating his seizures and blackout spells. The clinical social worker was consulted and provided him with outpatient resources. Tobacco cessation counseling was also ordered. He was started on vitamin therapy, nicotine patch, and the alcohol withdrawal protocol. He demonstrated no signs or symptoms consistent with alcohol withdrawal syndrome.   Seizure disorder.  It was  not known  if he has alcohol withdrawal seizures or alcohol-induced seizures. He has been noncompliant with anticonvulsant therapy prescribed during the previous hospitalization at [facility]. Nevertheless, while he was n.p.o., he was given IV Keppra. He was discharged on 750 mg of Keppra twice a day. There were no seizures during hospitalization.   Reported syncopal episodes/"blackouts".  These episodes may be related to seizures or intoxication from drugs. He was  neurologically stable. No evidence of syncope or presyncope during the  hospitalization.   Recent history of traumatic intracranial hemorrhage/subdural hematoma. Followup CT of the head revealed resolution of bleeding.   Mild hypokalemia. Repleted in the IV fluids.   Pancytopenia. His platelet count, hemoglobin, and WBC all decreased during the hospitalization. This was likely the consequence of IV fluids. Bone marrow suppression from alcohol abuse may have also played a part.     Procedures:  None  Consultations:  None  Discharge Exam: Filed Vitals:   07/20/13 0117 07/20/13 0550 07/20/13 0906  07/20/13 1400  BP: 156/108 162/113 134/94 134/96  Pulse:  73 73 80  Temp:  97.9 F (36.6 C)  98 F (36.7 C)  TempSrc:  Oral  Oral  Resp:  20  20  Height:      Weight:      SpO2:  98%  100%    General:  No acute disease. Ate lunch with no increase in abdominal pain. Cardiovascular: S1, S2, with a soft systolic murmur. Respiratory: Clear to auscultation bilaterally. Abdomen: Positive bowel sounds, soft, nontender, nondistended.  Discharge Instructions  Discharge Orders   Future Appointments Provider Department Dept Phone   07/21/2013 3:00 PM [facility] [phone]   Future Orders Complete By Expires     Diet - low sodium heart healthy  As directed     Discharge instructions  As directed     Comments:      Seek help to stop using illicit drugs and drinking alcohol. Take seizure medicine and blood pressure medicine as prescribed. Take other medicines as prescribed as well. Do not drive.    Driving Restrictions  As directed     Comments:      No driving at all.    Increase activity slowly  As directed         Medication List         cloNIDine 0.2 MG tablet  Commonly known as:  CATAPRES  Take 1 tablet (0.2 mg total) by mouth 2 (two) times daily. For  treatment of high blood pressure.     HYDROcodone-acetaminophen 10-325 MG per tablet  Commonly known as:  NORCO  Take 1-2 tablets by mouth every 4 (four) hours as needed for pain.     levETIRAcetam 750 MG tablet  Commonly known as:  KEPPRA  Take 1 tablet (750 mg total) by mouth 2 (two) times daily. Seizure medication     omeprazole 20 MG capsule  Commonly known as:  PRILOSEC  Take 1 capsule (20 mg total) by mouth daily. For treatment of inflammation of your stomach lining.       No Known Allergies     Follow-up Information   Follow up with [facility]. Schedule an appointment as soon as possible for a visit on 07/22/2013. (at 10:00)        The results of significant  diagnostics from this hospitalization (including imaging, microbiology, ancillary and laboratory) are listed below for reference.    Significant Diagnostic Studies: Ct Head Wo Contrast  07/16/2013   *RADIOLOGY REPORT*  Clinical Data: Head trauma, intracranial hemorrhage 4 weeks ago. Hypertension, blackouts, altered mental status  CT HEAD WITHOUT CONTRAST  Technique:  Contiguous axial images were obtained from the base of the skull through the vertex without contrast.  Comparison: 06/12/2013, 06/13/2013  Findings: Resolved subarachnoid and subdural hemorrhage compared to the prior study.  No new intracranial hemorrhage, mass effect, mass lesion, definite acute infarction, midline shift, herniation or hydrocephalus.  The inferior frontal lobes demonstrate white matter hypoattenuation diffusely, images 8-11 compatible with evolutionary changes from bifrontal closed head injury.  The cisterns are patent.  No cerebellar abnormality.  No extra-axial fluid collection.  Mastoids and sinuses are clear.  No skull abnormality.  IMPRESSION: Resolved subarachnoid and subdural hemorrhage compared to 06/12/2013.  Evolutionary changes in the inferior bifrontal white matter related to the closed head injury.  No current acute process by noncontrast CT.   Original Report Authenticated By: Judie Petit. Miles Costain, M.D.   US Abdomen Limited Ruq  07/16/2013   *RADIOLOGY REPORT*  Clinical Data:  Abdominal pain, evaluate for acute cholecystitis, history of hypertension and hyperlipidemia  LIMITED ABDOMINAL ULTRASOUND - RIGHT UPPER QUADRANT  Comparison:  CT abdomen pelvis - 11/05/2012  Findings:  Gallbladder:  There is a minimal amount of layering echogenic debris (images 48 and 77) within an otherwise normal-appearing gallbladder.  No gallbladder wall thickening or pericholecystic fluid.  Negative sonographic Murphy's sign.  Common bile duct:  Normal in size measuring 5.6 mm in diameter  Liver:  No focal mass lesion seen.  Within normal limits in  parenchymal echogenicity.  IMPRESSION: Suspected gallbladder sludge with an otherwise normal-appearing gallbladder.  Further evaluation may be obtained with a HIDA scan as clinically indicated.                    Original Report Authenticated By: Tacey Ruiz, MD    Microbiology: No results found for this or any previous visit (from the past 240 hour(s)).   Labs: Basic Metabolic Panel:  Recent Labs Lab 07/16/13 1141 07/17/13 0558 07/18/13 0547 07/19/13 0605 07/20/13 0544  NA 141 140 134* 136 138  K 3.5 3.2* 3.6 3.8 3.7  CL 101 105 101 103 103  CO2 29 27 26 24 27   GLUCOSE 109* 94 85 78 106*  BUN 6 6 5* 4* 3*  CREATININE 0.81 0.74 0.82 0.82 0.81  CALCIUM 9.4 8.6 8.6 8.8 9.0   Liver Function Tests:  Recent Labs Lab 07/16/13 1141 07/17/13 0558 07/18/13  0547 07/20/13 0544  AST 16 12 15 13   ALT 8 6 7 7   ALKPHOS 69 59 52 58  BILITOT 0.8 0.9 1.5* 1.0  PROT 7.8 6.2 5.8* 6.7  ALBUMIN 3.7 2.9* 2.7* 3.0*    Recent Labs Lab 07/16/13 1141 07/17/13 0558 07/18/13 0547 07/19/13 0605 07/20/13 0544  LIPASE 254* 155* 237* 259* 251*   No results found for this basename: AMMONIA,  in the last 168 hours CBC:  Recent Labs Lab 07/16/13 1141 07/17/13 0558 07/18/13 0547 07/19/13 0605  WBC 5.6 4.1 3.5* 3.1*  NEUTROABS 3.5  --   --   --   HGB 14.9 13.1 11.8* 12.5*  HCT 42.1 37.6* 34.6* 36.2*  MCV 96.6 97.4 98.3 97.1  PLT 199 154 140* 159   Cardiac Enzymes: No results found for this basename: CKTOTAL, CKMB, CKMBINDEX, TROPONINI,  in the last 168 hours BNP: BNP (last 3 results) No results found for this basename: PROBNP,  in the last 8760 hours CBG: No results found for this basename: GLUCAP,  in the last 168 hours     Signed:  Rye Dorado  [facility] 07/20/2013, 8:04 PM

## 2013-07-21 ENCOUNTER — Encounter (HOSPITAL_COMMUNITY): Payer: Self-pay | Admitting: Internal Medicine

## 2013-07-21 ENCOUNTER — Other Ambulatory Visit (HOSPITAL_COMMUNITY): Payer: Self-pay

## 2013-08-01 ENCOUNTER — Emergency Department (HOSPITAL_COMMUNITY)
Admission: EM | Admit: 2013-08-01 | Discharge: 2013-08-01 | Disposition: A | Discharge status: Home / Self Care | Payer: Self-pay | Attending: Emergency Medicine | Admitting: Emergency Medicine

## 2013-08-01 ENCOUNTER — Encounter (HOSPITAL_COMMUNITY): Payer: Self-pay | Admitting: *Deleted

## 2013-08-01 DIAGNOSIS — Z8709 Personal history of other diseases of the respiratory system: Secondary | ICD-10-CM | POA: Insufficient documentation

## 2013-08-01 DIAGNOSIS — R112 Nausea with vomiting, unspecified: Secondary | ICD-10-CM | POA: Insufficient documentation

## 2013-08-01 DIAGNOSIS — Z862 Personal history of diseases of the blood and blood-forming organs and certain disorders involving the immune mechanism: Secondary | ICD-10-CM | POA: Insufficient documentation

## 2013-08-01 DIAGNOSIS — F17200 Nicotine dependence, unspecified, uncomplicated: Secondary | ICD-10-CM | POA: Insufficient documentation

## 2013-08-01 DIAGNOSIS — Z91199 Patient's noncompliance with other medical treatment and regimen due to unspecified reason: Secondary | ICD-10-CM | POA: Insufficient documentation

## 2013-08-01 DIAGNOSIS — Z87828 Personal history of other (healed) physical injury and trauma: Secondary | ICD-10-CM | POA: Insufficient documentation

## 2013-08-01 DIAGNOSIS — Z9119 Patient's noncompliance with other medical treatment and regimen: Secondary | ICD-10-CM | POA: Insufficient documentation

## 2013-08-01 DIAGNOSIS — Z8781 Personal history of (healed) traumatic fracture: Secondary | ICD-10-CM | POA: Insufficient documentation

## 2013-08-01 DIAGNOSIS — Z8639 Personal history of other endocrine, nutritional and metabolic disease: Secondary | ICD-10-CM | POA: Insufficient documentation

## 2013-08-01 DIAGNOSIS — K859 Acute pancreatitis without necrosis or infection, unspecified: Secondary | ICD-10-CM | POA: Insufficient documentation

## 2013-08-01 DIAGNOSIS — Z8659 Personal history of other mental and behavioral disorders: Secondary | ICD-10-CM | POA: Insufficient documentation

## 2013-08-01 DIAGNOSIS — I10 Essential (primary) hypertension: Secondary | ICD-10-CM | POA: Insufficient documentation

## 2013-08-01 DIAGNOSIS — Z87442 Personal history of urinary calculi: Secondary | ICD-10-CM | POA: Insufficient documentation

## 2013-08-01 DIAGNOSIS — R569 Unspecified convulsions: Secondary | ICD-10-CM | POA: Insufficient documentation

## 2013-08-01 DIAGNOSIS — F1010 Alcohol abuse, uncomplicated: Secondary | ICD-10-CM | POA: Insufficient documentation

## 2013-08-01 DIAGNOSIS — G8929 Other chronic pain: Secondary | ICD-10-CM | POA: Insufficient documentation

## 2013-08-01 DIAGNOSIS — Z87448 Personal history of other diseases of urinary system: Secondary | ICD-10-CM | POA: Insufficient documentation

## 2013-08-01 DIAGNOSIS — R109 Unspecified abdominal pain: Secondary | ICD-10-CM | POA: Insufficient documentation

## 2013-08-01 DIAGNOSIS — K861 Other chronic pancreatitis: Secondary | ICD-10-CM | POA: Insufficient documentation

## 2013-08-01 HISTORY — DX: Other psychoactive substance abuse, uncomplicated: F19.10

## 2013-08-01 LAB — COMPREHENSIVE METABOLIC PANEL
ALT: 75 U/L — ABNORMAL HIGH (ref 0–53)
ALT: 54 U/L — ABNORMAL HIGH (ref 0–53)
Alkaline Phosphatase: 422 U/L — ABNORMAL HIGH (ref 39–117)
Alkaline Phosphatase: 380 U/L — ABNORMAL HIGH (ref 39–117)
BUN: 4 mg/dL — ABNORMAL LOW (ref 6–23)
CO2: 27 mEq/L (ref 19–32)
CO2: 29 mEq/L (ref 19–32)
Calcium: 9.1 mg/dL (ref 8.4–10.5)
GFR calc Af Amer: >90 mL/min (ref >90)
GFR calc Af Amer: >90 mL/min (ref >90)
GFR calc non Af Amer: >90 mL/min (ref >90)
GFR calc non Af Amer: >90 mL/min (ref >90)
Glucose, Bld: 122 mg/dL — ABNORMAL HIGH (ref 70–99)
Glucose, Bld: 118 mg/dL — ABNORMAL HIGH (ref 70–99)
Potassium: 3.2 mEq/L — ABNORMAL LOW (ref 3.5–5.1)
Sodium: 138 mEq/L (ref 135–145)
Sodium: 137 mEq/L (ref 135–145)
Total Protein: 7.2 g/dL (ref 6.0–8.3)

## 2013-08-01 LAB — CBC WITH DIFFERENTIAL/PLATELET
Basophils Absolute: 0 10*3/uL (ref 0.0–0.1)
Basophils Relative: 0 % (ref 0–1)
Hemoglobin: 14.7 g/dL (ref 13.0–17.0)
Lymphocytes Relative: 19 % (ref 12–46)
Lymphs Abs: 1.2 10*3/uL (ref 0.7–4.0)
MCHC: 34.6 g/dL (ref 30.0–36.0)
Neutro Abs: 6 10*3/uL (ref 1.7–7.7)
Neutrophils Relative %: 83 % — ABNORMAL HIGH (ref 43–77)
Neutrophils Relative %: 74 % (ref 43–77)
Platelets: 233 10*3/uL (ref 150–400)
RBC: 4.35 MIL/uL (ref 4.22–5.81)
RDW: 13.4 % (ref 11.5–15.5)
WBC: 6.4 10*3/uL (ref 4.0–10.5)

## 2013-08-01 MED ORDER — OXYCODONE-ACETAMINOPHEN 5-325 MG PO TABS: ORAL_TABLET | ORAL | Status: DC

## 2013-08-01 MED ORDER — ONDANSETRON HCL 4 MG/2ML IJ SOLN
4.0000 mg | Freq: Once | INTRAMUSCULAR | Status: AC
  Administered 2013-08-01: 4 mg via INTRAVENOUS
  Filled 2013-08-01: qty 2

## 2013-08-01 MED ORDER — HYDROMORPHONE HCL PF 1 MG/ML IJ SOLN
1.0000 mg | Freq: Once | INTRAMUSCULAR | Status: AC
  Administered 2013-08-01: 1 mg via INTRAVENOUS
  Filled 2013-08-01: qty 1

## 2013-08-01 MED ORDER — OXYCODONE-ACETAMINOPHEN 5-325 MG PO TABS
2.0000 | ORAL_TABLET | Freq: Once | ORAL | Status: AC
  Administered 2013-08-01: 2 via ORAL
  Filled 2013-08-01: qty 2

## 2013-08-01 MED ORDER — CLONIDINE HCL 0.2 MG PO TABS
0.2000 mg | ORAL_TABLET | Freq: Once | ORAL | Status: AC
  Administered 2013-08-01: 0.2 mg via ORAL
  Filled 2013-08-01: qty 1

## 2013-08-01 MED ORDER — FENTANYL CITRATE 0.05 MG/ML IJ SOLN
100.0000 ug | Freq: Once | INTRAMUSCULAR | Status: AC
  Administered 2013-08-01: 100 ug via INTRAMUSCULAR
  Filled 2013-08-01: qty 2

## 2013-08-01 MED ORDER — SODIUM CHLORIDE 0.9 % IV BOLUS (SEPSIS)
1000.0000 mL | Freq: Once | INTRAVENOUS | Status: AC
  Administered 2013-08-01: 1000 mL via INTRAVENOUS

## 2013-08-01 MED ORDER — ONDANSETRON HCL 4 MG PO TABS: 4.0000 mg | ORAL_TABLET | Freq: Three times a day (TID) | ORAL | Status: DC | PRN

## 2013-08-01 MED ORDER — POTASSIUM CHLORIDE 20 MEQ/15ML (10%) PO LIQD
40.0000 meq | Freq: Once | ORAL | Status: AC
  Administered 2013-08-01: 40 meq via ORAL
  Filled 2013-08-01: qty 30

## 2013-08-01 NOTE — ED Provider Notes (Signed)
CSN: [id number]     Arrival date & time 08/01/13  1627 History     First MD Initiated Contact with Patient 08/01/13 1736     Chief Complaint  Patient presents with  . Abdominal Pain   HPI Pt was seen at 1855.  Per pt, c/o gradual onset and persistence of constant acute flair of his chronic upper abd "pain" since yesterday. Has been associated with several intermittent episodes of N/V. States the pain began after he drank a significant amount of etoh. Describes the abd pain as "sharp," and per his usual chronic pain pattern. Has hx of pancreatitis, continues to drink etoh and use cocaine. States he is not interested in stopping either. Pt was evaluated in the ED several hours ago for the same complaint, stating he "didn't get any pain meds" and is requesting same. Denies diarrhea, no fevers, no back pain, no rash, no CP/SOB, no black or blood in stools. The symptoms have been associated with no other complaints. The patient has a significant history of similar symptoms previously, recently being evaluated for this complaint and multiple prior evals for same.          Past Medical History  Diagnosis Date  . Pancreatitis, acute     First episode earlier in 2012, hospitalized again in 02/2012 and 09/2012  . Hypertension     noncompliance  . Hyperlipidemia     noncompliance  . Lung collapse     h/o  . ETOH abuse   . Cocaine abuse   . DTs (delirium tremens)     history of  . Nephrolithiasis   . Tobacco abuse   . Seizure disorder 07/17/2013  . Noncompliance 07/17/2013  . Traumatic subdural hematoma 06/15/2013  . 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 Open skull fracture 06/15/2013  . Polysubstance abuse     etoh, cocaine   Past Surgical History  Procedure Laterality Date  . Mandible fracture surgery  2006  . Left axillary      surgery for deep laceration   Family History  Problem Relation Age of Onset  . Diabetes type II Mother   . Liver disease Neg Hx   . Colon cancer  Neg Hx   . GI problems Neg Hx    History  Substance Use Topics  . Smoking status: Current Every Day Smoker -- 1.00 packs/day    Types: Cigarettes  . Smokeless tobacco: Not on file  . Alcohol Use: Yes     Comment: Three 40oz per day    Review of Systems ROS: Statement: All systems negative except as marked or noted in the HPI; Constitutional: Negative for fever and chills. ; ; Eyes: Negative for eye pain, redness and discharge. ; ; ENMT: Negative for ear pain, hoarseness, nasal congestion, sinus pressure and sore throat. ; ; Cardiovascular: Negative for chest pain, palpitations, diaphoresis, dyspnea and peripheral edema. ; ; Respiratory: Negative for cough, wheezing and stridor. ; ; Gastrointestinal: +abd pain, N/V. Negative for diarrhea, blood in stool, hematemesis, jaundice and rectal bleeding. . ; ; Genitourinary: Negative for dysuria, flank pain and hematuria. ; ; Musculoskeletal: Negative for back pain and neck pain. Negative for swelling and trauma.; ; Skin: Negative for pruritus, rash, abrasions, blisters, bruising and skin lesion.; ; Neuro: Negative for headache, lightheadedness and neck stiffness. Negative for weakness, altered level of consciousness , altered mental status, extremity weakness, paresthesias, involuntary movement, seizure and syncope.       Allergies  Review of patient's allergies indicates no  known allergies.  Home Medications   Current Outpatient Rx  Name  Route  Sig  Dispense  Refill  . HYDROcodone-acetaminophen (NORCO) 10-325 MG per tablet   Oral   Take 1-2 tablets by mouth every 4 (four) hours as needed for pain.   60 tablet   0   . omeprazole (PRILOSEC) 20 MG capsule   Oral   Take 1 capsule (20 mg total) by mouth daily. For treatment of inflammation of your stomach lining.   30 capsule   3   . cloNIDine (CATAPRES) 0.2 MG tablet   Oral   Take 1 tablet (0.2 mg total) by mouth 2 (two) times daily. For treatment of high blood pressure.   60 tablet    3   . levETIRAcetam (KEPPRA) 750 MG tablet   Oral   Take 1 tablet (750 mg total) by mouth 2 (two) times daily. Seizure medication   60 tablet   3    BP 201/136  Pulse 75  Temp(Src) 98.3 F (36.8 C) (Oral)  Resp 20  Ht 6' (1.829 m)  Wt 165 lb (74.844 kg)  BMI 22.37 kg/m2  SpO2 99% BP 190/106  Pulse 83  Temp(Src) 98.3 F (36.8 C) (Oral)  Resp 20  Ht 6' (1.829 m)  Wt 165 lb (74.844 kg)  BMI 22.37 kg/m2  SpO2 94%   Physical Exam 1900: Physical examination:  Nursing notes reviewed; Vital signs and O2 SAT reviewed;  Constitutional: Well developed, Well nourished, Well hydrated, In no acute distress. Watching TV on my arrival to exam room.; Head:  Normocephalic, atraumatic; Eyes: EOMI, PERRL, No scleral icterus; ENMT: Mouth and pharynx normal, Mucous membranes moist; Neck: Supple, Full range of motion, No lymphadenopathy; Cardiovascular: Regular rate and rhythm, No murmur, rub, or gallop; Respiratory: Breath sounds clear & equal bilaterally, No rales, rhonchi, wheezes.  Speaking full sentences with ease, Normal respiratory effort/excursion; Chest: Nontender, Movement normal; Abdomen: Soft, +mild mid-epigastric tenderness to palp. No rebound or guarding. Nondistended, Normal bowel sounds; Genitourinary: No CVA tenderness; Extremities: Pulses normal, No tenderness, No edema, No calf edema or asymmetry.; Neuro: AA&Ox3, Major CN grossly intact.  Speech clear. Climbs on and off stretcher easily by himself Gait steady. No gross focal motor or sensory deficits in extremities.; Skin: Color normal, Warm, Dry.   ED Course   Procedures     MDM  MDM Reviewed: previous chart, nursing note and vitals Reviewed previous: labs and ultrasound Interpretation: labs   Results for orders placed during the hospital encounter of 08/01/13  CBC WITH DIFFERENTIAL      Result Value Range   WBC 6.4  4.0 - 10.5 K/uL   RBC 4.35  4.22 - 5.81 MIL/uL   Hemoglobin 14.5  13.0 - 17.0 g/dL   HCT 09.8  11.9 -  14.7 %   MCV 97.0  78.0 - 100.0 fL   MCH 33.3  26.0 - 34.0 pg   MCHC 34.4  30.0 - 36.0 g/dL   RDW 82.9  56.2 - 13.0 %   Platelets 233  150 - 400 K/uL   Neutrophils Relative % 74  43 - 77 %   Neutro Abs 4.8  1.7 - 7.7 K/uL   Lymphocytes Relative 19  12 - 46 %   Lymphs Abs 1.2  0.7 - 4.0 K/uL   Monocytes Relative 6  3 - 12 %   Monocytes Absolute 0.4  0.1 - 1.0 K/uL   Eosinophils Relative 1  0 - 5 %  Eosinophils Absolute 0.0  0.0 - 0.7 K/uL   Basophils Relative 0  0 - 1 %   Basophils Absolute 0.0  0.0 - 0.1 K/uL  COMPREHENSIVE METABOLIC PANEL      Result Value Range   Sodium 137  135 - 145 mEq/L   Potassium 3.2 (*) 3.5 - 5.1 mEq/L   Chloride 99  96 - 112 mEq/L   CO2 29  19 - 32 mEq/L   Glucose, Bld 118 (*) 70 - 99 mg/dL   BUN 4 (*) 6 - 23 mg/dL   Creatinine, Ser 1.61  0.50 - 1.35 mg/dL   Calcium 9.0  8.4 - 09.6 mg/dL   Total Protein 7.2  6.0 - 8.3 g/dL   Albumin 3.5  3.5 - 5.2 g/dL   AST 81 (*) 0 - 37 U/L   ALT 54 (*) 0 - 53 U/L   Alkaline Phosphatase 380 (*) 39 - 117 U/L   Total Bilirubin 1.6 (*) 0.3 - 1.2 mg/dL   GFR calc non Af Amer >90  >90 mL/min   GFR calc Af Amer >90  >90 mL/min  LIPASE, BLOOD      Result Value Range   Lipase 232 (*) 11 - 59 U/L  ETHANOL      Result Value Range   Alcohol, Ethyl (B) <11  0 - 11 mg/dL   US Abdomen Limited Ruq 07/16/2013   *RADIOLOGY REPORT*  Clinical Data:  Abdominal pain, evaluate for acute cholecystitis, history of hypertension and hyperlipidemia  LIMITED ABDOMINAL ULTRASOUND - RIGHT UPPER QUADRANT  Comparison:  CT abdomen pelvis - 11/05/2012  Findings:  Gallbladder:  There is a minimal amount of layering echogenic debris (images 48 and 77) within an otherwise normal-appearing gallbladder.  No gallbladder wall thickening or pericholecystic fluid.  Negative sonographic Murphy's sign.  Common bile duct:  Normal in size measuring 5.6 mm in diameter  Liver:  No focal mass lesion seen.  Within normal limits in parenchymal echogenicity.   IMPRESSION: Suspected gallbladder sludge with an otherwise normal-appearing gallbladder.  Further evaluation may be obtained with a HIDA scan as clinically indicated.                    Original Report Authenticated By: Tacey Ruiz, MD    Results for Paul, [name] (MRN [id number]) as of 08/01/2013 20:34  Ref. Range 07/16/2013 11:41 07/17/2013 05:58 07/18/2013 05:47 07/20/2013 05:44 08/01/2013 10:19 08/01/2013 18:26  Alkaline Phosphatase Latest Range: 39-117 U/L 69 59 52 58 422 (H) 380 (H)  Lipase Latest Range: 11-59 U/L 254 (H) 155 (H) 237 (H) 251 (H) 228 (H) 232 (H)  AST Latest Range: 0-37 U/L 16 12 15 13  257 (H) 81 (H)  ALT Latest Range: 0-53 U/L 8 6 7 7  75 (H) 54 (H)  Total Bilirubin Latest Range: 0.3-1.2 mg/dL 0.8 0.9 1.5 (H) 1.0 1.1 1.6 (H)    2030:  Potassium repleted PO. Labs otherwise per baseline. Pt has long hx of non-compliance with medications, except pain meds, as well as he absolutely does not want to stop drinking etoh or using cocaine (which he knows causes flairs of his pancreatitis). Pt has tol PO well without N/V since arrival to the ED several hours ago. Continually requesting "something more for pain" and "some pain medicine by an IV." Will give another dose of pain meds as well as his usual clonidine.  2230:  Pt has tol PO well without N/V for the past 6+ hours while in the  ED. Abd benign. Pt is watching TV without any signs of distress. Pain improved and pt wants to go home now. Again strongly advised to stop etoh and cocaine; pt states he does not want to. Dx and testing d/w pt.  Questions answered.  Verb understanding, agreeable to d/c home with outpt f/u.  The patient appears reasonably screened and/or stabilized for discharge and I doubt any other medical condition or other [facility] requiring further screening, evaluation, or treatment in the ED at this time prior to discharge.      Laray Anger, DO 08/04/13 [phone]

## 2013-08-01 NOTE — ED Notes (Signed)
Pt denies any chest pain,dizziness, or a headache at this time. Mild diaphoresis noted.

## 2013-08-01 NOTE — ED Notes (Signed)
Pt c/o abd pain, was seen in er for same this am, states that he has pain medication at a friend's house and is unable to get his pain medication from them but then pt states that he stayed at the house last night but the medication given to him is too strong for him.

## 2013-08-01 NOTE — ED Provider Notes (Signed)
CSN: [id number]     Arrival date & time 08/01/13  4098 History    This chart was scribed for Joya Gaskins, MD by Quintella Reichert, ED scribe.  This patient was seen in room APA04/APA04 and the patient's care was started at 10:05 AM.     Chief Complaint  Patient presents with  . Abdominal Pain    Patient is a 47 y.o. male presenting with abdominal pain. The history is provided by the patient. No language interpreter was used.  Abdominal Pain This is a recurrent problem. The current episode started 12 to 24 hours ago. The problem occurs constantly. Associated symptoms include abdominal pain. Pertinent negatives include no chest pain, no headaches and no shortness of breath. Nothing aggravates the symptoms. Nothing relieves the symptoms. He has tried nothing for the symptoms.    HPI Comments: Paul [name] is a 47 y.o. male with h/o acute pancreatitis who presents to the Emergency Department complaining of constant, sharp, severe periumbilical abdominal pain that began yesterday, with associated emesis.  Pt states pain is "in my pancreas."  Pt has h/o similar symptoms and notes that he was hospitalized for pancreatitis 2 months ago.  He states pancreatitis episodes in the past have been brought on by drinking and he last drank 5 pints of wine yesterday.  He denies fever, diarrhea, blood in stool, melena, blood in vomit, dysuria, urinary frequency, or any other associated symptoms.  He denies h/o abdominal surgery.     Past Medical History  Diagnosis Date  . Pancreatitis, acute     First episode earlier in 2012, hospitalized again in 02/2012 and 09/2012  . Hypertension     noncompliance  . Hyperlipidemia     noncompliance  . Lung collapse     h/o  . ETOH abuse   . Cocaine abuse   . DTs (delirium tremens)     history of  . Nephrolithiasis   . Tobacco abuse   . Seizure disorder 07/17/2013  . Noncompliance 07/17/2013  . Traumatic subdural hematoma 06/15/2013  . Traumatic  intracerebral hemorrhage 06/15/2013  . Open skull fracture 06/15/2013    Past Surgical History  Procedure Laterality Date  . Mandible fracture surgery  2006  . Left axillary      surgery for deep laceration    Family History  Problem Relation Age of Onset  . Diabetes type II Mother   . Liver disease Neg Hx   . Colon cancer Neg Hx   . GI problems Neg Hx     History  Substance Use Topics  . Smoking status: Current Every Day Smoker -- 1.00 packs/day    Types: Cigarettes  . Smokeless tobacco: Not on file  . Alcohol Use: Yes     Comment: Three 40oz per day     Review of Systems  Respiratory: Negative for shortness of breath.   Cardiovascular: Negative for chest pain.  Gastrointestinal: Positive for nausea, vomiting and abdominal pain. Negative for diarrhea and blood in stool.  Genitourinary: Negative for dysuria and frequency.  Neurological: Negative for headaches.  All other systems reviewed and are negative.      Allergies  Review of patient's allergies indicates no known allergies.  Home Medications   Current Outpatient Rx  Name  Route  Sig  Dispense  Refill  . cloNIDine (CATAPRES) 0.2 MG tablet   Oral   Take 1 tablet (0.2 mg total) by mouth 2 (two) times daily. For treatment of high blood pressure.  60 tablet   3   . HYDROcodone-acetaminophen (NORCO) 10-325 MG per tablet   Oral   Take 1-2 tablets by mouth every 4 (four) hours as needed for pain.   60 tablet   0   . levETIRAcetam (KEPPRA) 750 MG tablet   Oral   Take 1 tablet (750 mg total) by mouth 2 (two) times daily. Seizure medication   60 tablet   3   . omeprazole (PRILOSEC) 20 MG capsule   Oral   Take 1 capsule (20 mg total) by mouth daily. For treatment of inflammation of your stomach lining.   30 capsule   3    BP 202/153  Pulse 74  Temp(Src) 98.2 F (36.8 C) (Oral)  Resp 20  Ht 6\' 1"  (1.854 m)  Wt 160 lb (72.576 kg)  BMI 21.11 kg/m2  SpO2 98%   Physical Exam  Nursing note and  vitals reviewed. CONSTITUTIONAL: Well developed/well nourished HEAD: Normocephalic/atraumatic EYES: EOMI/PERRL.  No icterus. ENMT: Mucous membranes dry NECK: supple no meningeal signs SPINE:entire spine nontender CV: S1/S2 noted, no murmurs/rubs/gallops noted LUNGS: Lungs are clear to auscultation bilaterally, no apparent distress ABDOMEN: soft, diffuse mild tenderness, no rebound or guarding GU:no cva tenderness.  No inguinal hernia noted.  Chaperone present. NEURO: Pt is awake/alert, moves all extremitiesx4  EXTREMITIES: pulses normal, full ROM SKIN: warm, color normal PSYCH: no abnormalities of mood noted   ED Course  Procedures   DIAGNOSTIC STUDIES: Oxygen Saturation is 98% on room air, normal by my interpretation.    COORDINATION OF CARE: 10:11 AM-Discussed treatment plan which includes pain medication and labs with pt at bedside and pt agreed to plan.   Pt improved, taking PO and watching TV.  His abdomen is soft on repeat exam I suspect mild pancreatitis but he can be managed at home Advised to stop drinking ETOH Stable for d/c  Labs Reviewed  COMPREHENSIVE METABOLIC PANEL - Abnormal; Notable for the following:    Glucose, Bld 122 (*)    BUN 4 (*)    AST 257 (*)    ALT 75 (*)    Alkaline Phosphatase 422 (*)    All other components within normal limits  CBC WITH DIFFERENTIAL - Abnormal; Notable for the following:    Neutrophils Relative % 83 (*)    All other components within normal limits  LIPASE, BLOOD - Abnormal; Notable for the following:    Lipase 228 (*)    All other components within normal limits    MDM  Nursing notes including past medical history and social history reviewed and considered in documentation Labs/vital reviewed and considered Previous records reviewed and considered     Date: 08/01/2013  Rate: 57  Rhythm: sinus bradycardia  QRS Axis: normal  Intervals: normal  ST/T Wave abnormalities: nonspecific ST changes  Conduction  Disutrbances:none  Narrative Interpretation:   Old EKG Reviewed: unchanged from prior     I personally performed the services described in this documentation, which was scribed in my presence. The recorded information has been reviewed and is accurate.       Joya Gaskins, MD 08/01/13 1304

## 2013-08-01 NOTE — ED Notes (Addendum)
Pt c/o abd pain, n/v that started yesterday, has hx pancreatitis, admits to drinking 5 pints of wine yesterday and not taking his blood pressure medication in 4 days due to the medicine making his sick

## 2013-08-03 ENCOUNTER — Observation Stay (HOSPITAL_COMMUNITY): Payer: Self-pay

## 2013-08-03 ENCOUNTER — Encounter (HOSPITAL_COMMUNITY): Payer: Self-pay

## 2013-08-03 ENCOUNTER — Inpatient Hospital Stay (HOSPITAL_COMMUNITY)
Admission: EM | Admit: 2013-08-03 | Discharge: 2013-08-05 | DRG: 440 | Disposition: A | Discharge status: Home / Self Care | Payer: Self-pay | Attending: Internal Medicine | Admitting: Internal Medicine

## 2013-08-03 DIAGNOSIS — F1010 Alcohol abuse, uncomplicated: Secondary | ICD-10-CM | POA: Diagnosis present

## 2013-08-03 DIAGNOSIS — R7989 Other specified abnormal findings of blood chemistry: Secondary | ICD-10-CM | POA: Diagnosis present

## 2013-08-03 DIAGNOSIS — Z91199 Patient's noncompliance with other medical treatment and regimen due to unspecified reason: Secondary | ICD-10-CM

## 2013-08-03 DIAGNOSIS — G40909 Epilepsy, unspecified, not intractable, without status epilepticus: Secondary | ICD-10-CM

## 2013-08-03 DIAGNOSIS — K852 Alcohol induced acute pancreatitis without necrosis or infection: Secondary | ICD-10-CM | POA: Diagnosis present

## 2013-08-03 DIAGNOSIS — R748 Abnormal levels of other serum enzymes: Secondary | ICD-10-CM | POA: Diagnosis present

## 2013-08-03 DIAGNOSIS — I10 Essential (primary) hypertension: Secondary | ICD-10-CM | POA: Diagnosis present

## 2013-08-03 DIAGNOSIS — F17200 Nicotine dependence, unspecified, uncomplicated: Secondary | ICD-10-CM | POA: Diagnosis present

## 2013-08-03 DIAGNOSIS — K860 Alcohol-induced chronic pancreatitis: Secondary | ICD-10-CM

## 2013-08-03 DIAGNOSIS — K861 Other chronic pancreatitis: Secondary | ICD-10-CM | POA: Diagnosis present

## 2013-08-03 DIAGNOSIS — Z720 Tobacco use: Secondary | ICD-10-CM

## 2013-08-03 DIAGNOSIS — E785 Hyperlipidemia, unspecified: Secondary | ICD-10-CM | POA: Diagnosis present

## 2013-08-03 DIAGNOSIS — R55 Syncope and collapse: Secondary | ICD-10-CM

## 2013-08-03 DIAGNOSIS — D61818 Other pancytopenia: Secondary | ICD-10-CM

## 2013-08-03 DIAGNOSIS — Z87898 Personal history of other specified conditions: Secondary | ICD-10-CM

## 2013-08-03 DIAGNOSIS — F10929 Alcohol use, unspecified with intoxication, unspecified: Secondary | ICD-10-CM

## 2013-08-03 DIAGNOSIS — R109 Unspecified abdominal pain: Secondary | ICD-10-CM

## 2013-08-03 DIAGNOSIS — Z79899 Other long term (current) drug therapy: Secondary | ICD-10-CM

## 2013-08-03 DIAGNOSIS — F1410 Cocaine abuse, uncomplicated: Secondary | ICD-10-CM | POA: Diagnosis present

## 2013-08-03 DIAGNOSIS — E876 Hypokalemia: Secondary | ICD-10-CM | POA: Diagnosis present

## 2013-08-03 DIAGNOSIS — K920 Hematemesis: Secondary | ICD-10-CM

## 2013-08-03 DIAGNOSIS — K859 Acute pancreatitis without necrosis or infection, unspecified: Principal | ICD-10-CM

## 2013-08-03 DIAGNOSIS — R569 Unspecified convulsions: Secondary | ICD-10-CM | POA: Diagnosis present

## 2013-08-03 DIAGNOSIS — Z9119 Patient's noncompliance with other medical treatment and regimen: Secondary | ICD-10-CM

## 2013-08-03 LAB — CBC WITH DIFFERENTIAL/PLATELET
Basophils Absolute: 0 10*3/uL (ref 0.0–0.1)
Eosinophils Absolute: 0.1 10*3/uL (ref 0.0–0.7)
Eosinophils Relative: 1 % (ref 0–5)
Lymphocytes Relative: 22 % (ref 12–46)
Lymphs Abs: 1.6 10*3/uL (ref 0.7–4.0)
MCH: 33.1 pg (ref 26.0–34.0)
MCV: 97.3 fL (ref 78.0–100.0)
Neutrophils Relative %: 66 % (ref 43–77)
Platelets: 233 10*3/uL (ref 150–400)
RBC: 4.41 MIL/uL (ref 4.22–5.81)
RDW: 13.2 % (ref 11.5–15.5)
WBC: 7.2 10*3/uL (ref 4.0–10.5)

## 2013-08-03 LAB — COMPREHENSIVE METABOLIC PANEL
ALT: 25 U/L (ref 0–53)
AST: 13 U/L (ref 0–37)
Alkaline Phosphatase: 280 U/L — ABNORMAL HIGH (ref 39–117)
Calcium: 9.3 mg/dL (ref 8.4–10.5)
Potassium: 3.3 mEq/L — ABNORMAL LOW (ref 3.5–5.1)
Sodium: 138 mEq/L (ref 135–145)
Total Protein: 7.4 g/dL (ref 6.0–8.3)

## 2013-08-03 LAB — LACTIC ACID, PLASMA: Lactic Acid, Venous: 1.1 mmol/L (ref 0.5–2.2)

## 2013-08-03 MED ORDER — SODIUM CHLORIDE 0.9 % IV SOLN
INTRAVENOUS | Status: DC
  Administered 2013-08-03 – 2013-08-05 (×6): via INTRAVENOUS

## 2013-08-03 MED ORDER — SODIUM CHLORIDE 0.9 % IV BOLUS (SEPSIS)
1000.0000 mL | Freq: Once | INTRAVENOUS | Status: AC
  Administered 2013-08-03: 1000 mL via INTRAVENOUS

## 2013-08-03 MED ORDER — ADULT MULTIVITAMIN W/MINERALS CH
1.0000 | ORAL_TABLET | Freq: Every day | ORAL | Status: DC
  Administered 2013-08-04 – 2013-08-05 (×2): 1 via ORAL
  Filled 2013-08-03 (×2): qty 1

## 2013-08-03 MED ORDER — LORAZEPAM 2 MG/ML IJ SOLN: 1.0000 mg | Freq: Four times a day (QID) | INTRAMUSCULAR | Status: DC | PRN

## 2013-08-03 MED ORDER — ONDANSETRON HCL 4 MG/2ML IJ SOLN
4.0000 mg | INTRAMUSCULAR | Status: AC | PRN
  Administered 2013-08-03 (×2): 4 mg via INTRAVENOUS
  Filled 2013-08-03 (×2): qty 2

## 2013-08-03 MED ORDER — LORAZEPAM 0.5 MG PO TABS: 1.0000 mg | ORAL_TABLET | Freq: Four times a day (QID) | ORAL | Status: DC | PRN

## 2013-08-03 MED ORDER — HYDROMORPHONE HCL PF 1 MG/ML IJ SOLN
1.0000 mg | INTRAMUSCULAR | Status: DC | PRN
  Administered 2013-08-03 (×3): 1 mg via INTRAVENOUS
  Filled 2013-08-03 (×3): qty 1

## 2013-08-03 MED ORDER — IOHEXOL 300 MG/ML  SOLN
100.0000 mL | Freq: Once | INTRAMUSCULAR | Status: AC | PRN
  Administered 2013-08-03: 100 mL via INTRAVENOUS

## 2013-08-03 MED ORDER — ONDANSETRON HCL 4 MG PO TABS: 4.0000 mg | ORAL_TABLET | Freq: Four times a day (QID) | ORAL | Status: DC | PRN

## 2013-08-03 MED ORDER — LABETALOL HCL 5 MG/ML IV SOLN: 10.0000 mg | INTRAVENOUS | Status: DC | PRN

## 2013-08-03 MED ORDER — ENOXAPARIN SODIUM 40 MG/0.4ML ~~LOC~~ SOLN
40.0000 mg | SUBCUTANEOUS | Status: DC
  Administered 2013-08-03 – 2013-08-04 (×2): 40 mg via SUBCUTANEOUS
  Filled 2013-08-03 (×2): qty 0.4

## 2013-08-03 MED ORDER — ONDANSETRON HCL 4 MG/2ML IJ SOLN: 4.0000 mg | Freq: Four times a day (QID) | INTRAMUSCULAR | Status: DC | PRN

## 2013-08-03 MED ORDER — IOHEXOL 300 MG/ML  SOLN
50.0000 mL | Freq: Once | INTRAMUSCULAR | Status: AC | PRN
  Administered 2013-08-03: 50 mL via ORAL

## 2013-08-03 MED ORDER — IOHEXOL 350 MG/ML SOLN: 100.0000 mL | Freq: Once | INTRAVENOUS | Status: AC | PRN

## 2013-08-03 MED ORDER — HYDROMORPHONE HCL PF 1 MG/ML IJ SOLN
1.0000 mg | INTRAMUSCULAR | Status: AC | PRN
  Administered 2013-08-03 – 2013-08-04 (×4): 1 mg via INTRAVENOUS
  Filled 2013-08-03 (×4): qty 1

## 2013-08-03 MED ORDER — VITAMIN B-1 100 MG PO TABS
100.0000 mg | ORAL_TABLET | Freq: Every day | ORAL | Status: DC
  Administered 2013-08-04 – 2013-08-05 (×2): 100 mg via ORAL
  Filled 2013-08-03 (×2): qty 1

## 2013-08-03 MED ORDER — LEVETIRACETAM 500 MG PO TABS
750.0000 mg | ORAL_TABLET | Freq: Every day | ORAL | Status: DC
  Administered 2013-08-04 – 2013-08-05 (×2): 750 mg via ORAL
  Filled 2013-08-03 (×2): qty 2

## 2013-08-03 MED ORDER — HYDRALAZINE HCL 25 MG PO TABS
25.0000 mg | ORAL_TABLET | Freq: Four times a day (QID) | ORAL | Status: DC | PRN
  Filled 2013-08-03: qty 1

## 2013-08-03 MED ORDER — SODIUM CHLORIDE 0.9 % IV SOLN: INTRAVENOUS | Status: AC

## 2013-08-03 MED ORDER — HYDRALAZINE HCL 25 MG PO TABS
ORAL_TABLET | ORAL | Status: AC
  Filled 2013-08-03: qty 1

## 2013-08-03 MED ORDER — CLONIDINE HCL 0.2 MG PO TABS
0.2000 mg | ORAL_TABLET | Freq: Every day | ORAL | Status: DC
  Administered 2013-08-03 – 2013-08-05 (×3): 0.2 mg via ORAL
  Filled 2013-08-03 (×3): qty 1

## 2013-08-03 MED ORDER — LABETALOL HCL 5 MG/ML IV SOLN
20.0000 mg | INTRAVENOUS | Status: AC | PRN
  Administered 2013-08-03 (×2): 20 mg via INTRAVENOUS
  Filled 2013-08-03 (×2): qty 4

## 2013-08-03 MED ORDER — HYDRALAZINE HCL 25 MG PO TABS
25.0000 mg | ORAL_TABLET | Freq: Once | ORAL | Status: AC
  Administered 2013-08-03: 25 mg via ORAL
  Filled 2013-08-03: qty 1

## 2013-08-03 MED ORDER — THIAMINE HCL 100 MG/ML IJ SOLN: 100.0000 mg | Freq: Every day | INTRAMUSCULAR | Status: DC

## 2013-08-03 MED ORDER — PANTOPRAZOLE SODIUM 40 MG IV SOLR
40.0000 mg | INTRAVENOUS | Status: DC
  Administered 2013-08-03 – 2013-08-04 (×2): 40 mg via INTRAVENOUS
  Filled 2013-08-03 (×2): qty 40

## 2013-08-03 MED ORDER — FOLIC ACID 1 MG PO TABS
1.0000 mg | ORAL_TABLET | Freq: Every day | ORAL | Status: DC
  Administered 2013-08-04 – 2013-08-05 (×2): 1 mg via ORAL
  Filled 2013-08-03 (×2): qty 1

## 2013-08-03 MED ORDER — METOPROLOL TARTRATE 25 MG PO TABS
25.0000 mg | ORAL_TABLET | Freq: Two times a day (BID) | ORAL | Status: DC
  Administered 2013-08-03 – 2013-08-05 (×4): 25 mg via ORAL
  Filled 2013-08-03 (×4): qty 1

## 2013-08-03 MED ORDER — POTASSIUM CHLORIDE CRYS ER 20 MEQ PO TBCR
40.0000 meq | EXTENDED_RELEASE_TABLET | ORAL | Status: AC
  Administered 2013-08-03 – 2013-08-04 (×2): 40 meq via ORAL
  Filled 2013-08-03 (×2): qty 2

## 2013-08-03 NOTE — ED Notes (Signed)
Pt reports was discharged from hospital 2 days ago.  Reports was admitted with pancreatitis.  Pt reports percocet is not helping his pain.  BP  160/136.  Pt also reports n/v x 3 days.

## 2013-08-03 NOTE — H&P (Addendum)
PCP:   No PCP Per Patient   Chief Complaint:  Abdominal pain  HPI: 47 year old male with a history of chronic pancreatitis came to the ED today with abdominal pain which started 3 days ago. Patient has a history of alcohol abuse and his last drink was 4 days ago he drank 5 pints of wine on that day. He also had nausea vomiting today. He says the pain is located in the pericervical region and radiates to back. He denies any chest pain no shortness of breath. The pain has improved since he came to the ED. Patient recently had ultrasound of the abdomen for elevated alkaline  phosphatase and LFTs, and radiology recommended HIDA scan due to gallbladder sludge.  Allergies:  No Known Allergies    Past Medical History  Diagnosis Date  . Pancreatitis, acute     First episode earlier in 2012, hospitalized again in 02/2012 and 09/2012  . Hypertension     noncompliance  . Hyperlipidemia     noncompliance  . Lung collapse     h/o  . ETOH abuse   . Cocaine abuse   . DTs (delirium tremens)     history of  . Nephrolithiasis   . Tobacco abuse   . Seizure disorder 07/17/2013  . Noncompliance 07/17/2013  . Traumatic subdural hematoma 06/15/2013  . 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 Open skull fracture 06/15/2013  . Polysubstance abuse     etoh, cocaine    Past Surgical History  Procedure Laterality Date  . Mandible fracture surgery  2006  . Left axillary      surgery for deep laceration    Prior to Admission medications   Medication Sig Start Date End Date Taking? Authorizing Provider  cloNIDine (CATAPRES) 0.2 MG tablet Take 0.2 mg by mouth daily. Prescribed one tablet twice daily but patient only takes once daily.   Yes Historical Provider, MD  HYDROcodone-acetaminophen (NORCO) 10-325 MG per tablet Take 1-2 tablets by mouth every 4 (four) hours as needed for pain. 07/20/13  Yes Elliot Cousin, MD  levETIRAcetam (KEPPRA) 750 MG tablet Take 750 mg by mouth daily. Prescribed one  tablet twice daily but the patient states that she takes one tablet daily   Yes Historical Provider, MD  omeprazole (PRILOSEC) 20 MG capsule Take 1 capsule (20 mg total) by mouth daily. For treatment of inflammation of your stomach lining. 07/20/13  Yes Elliot Cousin, MD  oxyCODONE-acetaminophen (PERCOCET/ROXICET) 5-325 MG per tablet Take 1-2 tablets by mouth every 4 (four) hours as needed for pain.   Yes Historical Provider, MD  ondansetron (ZOFRAN) 4 MG tablet Take 1 tablet (4 mg total) by mouth every 8 (eight) hours as needed for nausea. 08/01/13   Laray Anger, DO    Social History:  reports that he has been smoking Cigarettes.  He has been smoking about 1.00 pack per day. He does not have any smokeless tobacco history on file. He reports that  drinks alcohol. He reports that he uses illicit drugs (Cocaine) about 3 times per week.  Family History  Problem Relation Age of Onset  . Diabetes type II Mother   . Liver disease Neg Hx   . Colon cancer Neg Hx   . GI problems Neg Hx      All the positives are listed in BOLD  Review of Systems:  HEENT: Headache, blurred vision, runny nose, sore throat Neck: Hypothyroidism, hyperthyroidism,,lymphadenopathy Chest : Shortness of breath, history of COPD, Asthma Heart : Chest pain, history  of coronary arterey disease GI:  Nausea, vomiting, diarrhea, constipation, GERD GU: Dysuria, urgency, frequency of urination, hematuria Neuro: Stroke, seizures, syncope Psych: Depression, anxiety, hallucinations   Physical Exam: Blood pressure 149/101, pulse 86, temperature 98.9 F (37.2 C), temperature source Oral, resp. rate 15, height 6' (1.829 m), weight 74.844 kg (165 lb), SpO2 98.00%. Constitutional:   Patient is a well-developed and well-nourished male* in no acute distress and cooperative with exam. Head: Normocephalic and atraumatic Mouth: Mucus membranes moist Eyes: PERRL, EOMI, conjunctivae normal Neck: Supple, No  Thyromegaly Cardiovascular: RRR, S1 normal, S2 normal Pulmonary/Chest: CTAB, no wheezes, rales, or rhonchi Abdominal: Soft. Tenderness in the periumbilical region with guarding and rigidity non-distended, bowel sounds are normal, no masses, organomegaly Neurological: A&O x3, Strenght is normal and symmetric bilaterally, cranial nerve II-XII are grossly intact, no focal motor deficit, sensory intact to light touch bilaterally.  Extremities : No Cyanosis, Clubbing or Edema   Labs on Admission:  Results for orders placed during the hospital encounter of 08/03/13 (from the past 48 hour(s))  CBC WITH DIFFERENTIAL     Status: None   Collection Time    08/03/13  4:27 PM      Result Value Range   WBC 7.2  4.0 - 10.5 K/uL   RBC 4.41  4.22 - 5.81 MIL/uL   Hemoglobin 14.6  13.0 - 17.0 g/dL   HCT 16.1  09.6 - 04.5 %   MCV 97.3  78.0 - 100.0 fL   MCH 33.1  26.0 - 34.0 pg   MCHC 34.0  30.0 - 36.0 g/dL   RDW 40.9  81.1 - 91.4 %   Platelets 233  150 - 400 K/uL   Neutrophils Relative % 66  43 - 77 %   Neutro Abs 4.7  1.7 - 7.7 K/uL   Lymphocytes Relative 22  12 - 46 %   Lymphs Abs 1.6  0.7 - 4.0 K/uL   Monocytes Relative 11  3 - 12 %   Monocytes Absolute 0.8  0.1 - 1.0 K/uL   Eosinophils Relative 1  0 - 5 %   Eosinophils Absolute 0.1  0.0 - 0.7 K/uL   Basophils Relative 0  0 - 1 %   Basophils Absolute 0.0  0.0 - 0.1 K/uL  COMPREHENSIVE METABOLIC PANEL     Status: Abnormal   Collection Time    08/03/13  4:27 PM      Result Value Range   Sodium 138  135 - 145 mEq/L   Potassium 3.3 (*) 3.5 - 5.1 mEq/L   Chloride 96  96 - 112 mEq/L   CO2 33 (*) 19 - 32 mEq/L   Glucose, Bld 126 (*) 70 - 99 mg/dL   BUN 11  6 - 23 mg/dL   Creatinine, Ser 7.82  0.50 - 1.35 mg/dL   Calcium 9.3  8.4 - 95.6 mg/dL   Total Protein 7.4  6.0 - 8.3 g/dL   Albumin 3.4 (*) 3.5 - 5.2 g/dL   AST 13  0 - 37 U/L   ALT 25  0 - 53 U/L   Alkaline Phosphatase 280 (*) 39 - 117 U/L   Total Bilirubin 1.4 (*) 0.3 - 1.2 mg/dL    GFR calc non Af Amer >90  >90 mL/min   GFR calc Af Amer >90  >90 mL/min   Comment:            The eGFR has been calculated     using the CKD EPI equation.  This calculation has not been     validated in all clinical     situations.     eGFR's persistently     <90 mL/min signify     possible Chronic Kidney Disease.  LIPASE, BLOOD     Status: Abnormal   Collection Time    08/03/13  4:27 PM      Result Value Range   Lipase 226 (*) 11 - 59 U/L  ETHANOL     Status: None   Collection Time    08/03/13  4:27 PM      Result Value Range   Alcohol, Ethyl (B) <11  0 - 11 mg/dL   Comment:            LOWEST DETECTABLE LIMIT FOR     SERUM ALCOHOL IS 11 mg/dL     FOR MEDICAL PURPOSES ONLY  LACTIC ACID, PLASMA     Status: None   Collection Time    08/03/13  4:32 PM      Result Value Range   Lactic Acid, Venous 1.1  0.5 - 2.2 mmol/L    Radiological Exams on Admission: No results found.  Assessment/Plan Active Problems:   Acute alcoholic pancreatitis   HTN (hypertension)   Cocaine abuse   Convulsions/seizures   ETOH abuse  Hypokalemia  Abdominal pain Patient's pain is located in the  Periumbilical region with guarding and rigidity. Will obtain CT scan of the abdomen and pelvis to rule out any other abdominal pathology causing this pain  Acute alcoholic pancreatitis Patient has history of chronic alcoholic pancreatitis Lipase is elevated, he also has elevated alkaline phosphatase as well as LFTs Will obtain HIDA scan to rule out gallstone as etiology for pancreatitis We'll keep him n.p.o. start IV fluids at 125 mL per hour  Hypertension Will continue Catapres Also start hydralazine when necessary  Seizures We'll continue with Keppra  Hypokalemia We'll replace potassium  Alcohol abuse We'll start CIWA protocol for possibility of alcohol withdrawal  Code status: Full code  Family discussion: Discussed with patient in detail   Time Spent on Admission: 55  min  Hailey Miles S [facility] Pager: [phone] 08/03/2013, 7:53 PM  If 7PM-7AM, please contact night-coverage  [email or web address]  Password [email or web address]

## 2013-08-03 NOTE — ED Notes (Signed)
Dr. Sharl Ma aware of BP. Will given PO catapress and recheck bp in 1 hr then may go to floor if bp stable

## 2013-08-03 NOTE — ED Provider Notes (Signed)
CSN: [id number]     Arrival date & time 08/03/13  1515 History  This chart was scribed for Flint Melter, MD by Bennett Scrape, ED Scribe. This patient was seen in room APA18/APA18 and the patient's care was started at 3:36 PM.   Chief Complaint  Patient presents with  . Abdominal Pain    Patient is a 47 y.o. male presenting with abdominal pain. The history is provided by the patient. No language interpreter was used.  Abdominal Pain Pain location:  Periumbilical Pain quality: sharp   Duration:  3 days Timing:  Constant Progression:  Worsening Associated symptoms: nausea and vomiting   Associated symptoms: no cough, no diarrhea and no fever     HPI Comments: JAYDEEN ODOR is a 47 y.o. male brought in by ambulance, who presents to the Emergency Department complaining of 3 days of worsening periumbilical abdominal pain with associated nausea and emesis. He describes the pain as a constant sharp pain. He states that he has been unable to keep down liquids or food secondary to vomiting. He also reports diaphoresis, weakness, dizziness and bilateral lower back pain that is aggravated with ambulating and mildly improved with laying down. He was seen in the ED 2 days ago for the same. He denies any alcohol use or continued cocaine use. Per prior records, pt had an [place] one month ago that showed an inflamed liver. He denies cough, HA and fever.  Past Medical History  Diagnosis Date  . Pancreatitis, acute     First episode earlier in 2012, hospitalized again in 02/2012 and 09/2012  . Hypertension     noncompliance  . Hyperlipidemia     noncompliance  . Lung collapse     h/o  . ETOH abuse   . Cocaine abuse   . DTs (delirium tremens)     history of  . Nephrolithiasis   . Tobacco abuse   . Seizure disorder 07/17/2013  . Noncompliance 07/17/2013  . Traumatic subdural hematoma 06/15/2013  . 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 Open skull fracture 06/15/2013  . Polysubstance  abuse     etoh, cocaine   Past Surgical History  Procedure Laterality Date  . Mandible fracture surgery  2006  . Left axillary      surgery for deep laceration   Family History  Problem Relation Age of Onset  . Diabetes type II Mother   . Liver disease Neg Hx   . Colon cancer Neg Hx   . GI problems Neg Hx    History  Substance Use Topics  . Smoking status: Current Every Day Smoker -- 1.00 packs/day    Types: Cigarettes  . Smokeless tobacco: Not on file  . Alcohol Use: Yes     Comment: Three 40oz per day    Review of Systems  Constitutional: Positive for diaphoresis. Negative for fever.  Respiratory: Negative for cough.   Gastrointestinal: Positive for nausea, vomiting and abdominal pain. Negative for diarrhea.  Musculoskeletal: Positive for back pain.  Neurological: Positive for dizziness and weakness. Negative for headaches.  All other systems reviewed and are negative.    Allergies  Review of patient's allergies indicates no known allergies.  Home Medications   Current Outpatient Rx  Name  Route  Sig  Dispense  Refill  . cloNIDine (CATAPRES) 0.2 MG tablet   Oral   Take 0.2 mg by mouth daily. Prescribed one tablet twice daily but patient only takes once daily.         Marland Kitchen  HYDROcodone-acetaminophen (NORCO) 10-325 MG per tablet   Oral   Take 1-2 tablets by mouth every 4 (four) hours as needed for pain.   60 tablet   0   . levETIRAcetam (KEPPRA) 750 MG tablet   Oral   Take 750 mg by mouth daily. Prescribed one tablet twice daily but the patient states that she takes one tablet daily         . omeprazole (PRILOSEC) 20 MG capsule   Oral   Take 1 capsule (20 mg total) by mouth daily. For treatment of inflammation of your stomach lining.   30 capsule   3   . oxyCODONE-acetaminophen (PERCOCET/ROXICET) 5-325 MG per tablet   Oral   Take 1-2 tablets by mouth every 4 (four) hours as needed for pain.         Marland Kitchen ondansetron (ZOFRAN) 4 MG tablet   Oral   Take  1 tablet (4 mg total) by mouth every 8 (eight) hours as needed for nausea.   6 tablet   0    Triage Vitals: BP 173/128  Pulse 114  Temp(Src) 99.1 F (37.3 C) (Oral)  Resp 20  Ht 6' (1.829 m)  Wt 165 lb (74.844 kg)  BMI 22.37 kg/m2  SpO2 97%  Physical Exam  Nursing note and vitals reviewed. Constitutional: He is oriented to person, place, and time. He appears well-developed and well-nourished.  HENT:  Head: Normocephalic and atraumatic.  Right Ear: External ear normal.  Left Ear: External ear normal.  Eyes: Conjunctivae and EOM are normal. Pupils are equal, round, and reactive to light.  Neck: Normal range of motion and phonation normal. Neck supple.  Cardiovascular: Regular rhythm, normal heart sounds and intact distal pulses.  Tachycardia present.   Pulmonary/Chest: Effort normal and breath sounds normal. He exhibits no bony tenderness.  Abdominal: Soft. Normal appearance. He exhibits no mass. There is tenderness (diffusely, ore in the RUQ and LUQ bilaterally). There is no rebound and no guarding.  Musculoskeletal: Normal range of motion.  Neurological: He is alert and oriented to person, place, and time. He has normal strength. No cranial nerve deficit or sensory deficit. He exhibits normal muscle tone. Coordination normal.  Skin: Skin is warm, dry and intact.  Psychiatric: He has a normal mood and affect. His behavior is normal. Judgment and thought content normal.    ED Course   Procedures (including critical care time)  Medications  0.9 %  sodium chloride infusion ( Intravenous New Bag/Given 08/03/13 1831)  HYDROmorphone (DILAUDID) injection 1 mg (1 mg Intravenous Given 08/03/13 1803)  labetalol (NORMODYNE,TRANDATE) injection 20 mg (20 mg Intravenous Given 08/03/13 1830)  sodium chloride 0.9 % bolus 1,000 mL (0 mLs Intravenous Stopped 08/03/13 1659)  ondansetron (ZOFRAN) injection 4 mg (4 mg Intravenous Given 08/03/13 1803)   Patient Vitals for the past 24 hrs:  BP Temp Temp  src Pulse Resp SpO2 Height Weight  08/03/13 1900 149/101 mmHg - - 86 15 - - -  08/03/13 1837 152/106 mmHg - - 94 18 98 % - -  08/03/13 1805 199/133 mmHg - - 90 16 99 % - -  08/03/13 1700 186/132 mmHg 98.9 F (37.2 C) Oral - 18 98 % - -  08/03/13 1648 130/76 mmHg - - 90 18 100 % - -  08/03/13 1536 198/142 mmHg - - 103 18 - - -  08/03/13 1519 173/128 mmHg 99.1 F (37.3 C) Oral 114 20 97 % 6' (1.829 m) 165 lb (74.844 kg)  DIAGNOSTIC STUDIES: Oxygen Saturation is 97% on room air, normal by my interpretation.    COORDINATION OF CARE: 3:40 PM-Discussed treatment plan which includes medications, CBC panel, CMP and UA with pt at bedside and pt agreed to plan.  6:25 PM-Pt rechecked and reports improvement in pain with medications given. BP is 199/133. Pt states that he got his last BP medication prescription from the [facility] 2 months ago but stopped taking them because of the "groggy" side effects they cause. Advised pt of the dangers of not taking BP medications. Discussed treatment of BP which is persistently elevated and pt agreed. 7:28 PM- Bp improved with single dose of Labetolol.  7:30 PM-Consult complete with Dr. Sharl Ma. Patient case explained and discussed. He agrees to admit patient for further evaluation and treatment. Call ended at 1931   Date: 08/03/2013  Rate: 96  Rhythm: normal sinus rhythm  QRS Axis: normal  Intervals: QT prolonged  ST/T Wave abnormalities: nonspecific ST changes  Conduction Disutrbances:none  Narrative Interpretation: LVH, left atrial enlargement  Old EKG Reviewed: changes noted and QTs are slightly longer   Labs Reviewed  COMPREHENSIVE METABOLIC PANEL - Abnormal; Notable for the following:    Potassium 3.3 (*)    CO2 33 (*)    Glucose, Bld 126 (*)    Albumin 3.4 (*)    Alkaline Phosphatase 280 (*)    Total Bilirubin 1.4 (*)    All other components within normal limits  LIPASE, BLOOD - Abnormal; Notable for the following:    Lipase 226 (*)    All other  components within normal limits  CBC WITH DIFFERENTIAL  LACTIC ACID, PLASMA  ETHANOL  URINALYSIS, ROUTINE W REFLEX MICROSCOPIC  URINE RAPID DRUG SCREEN (HOSP PERFORMED)    1. Abdominal pain   2. Hypertension     MDM  Abdominal pain, recurrent, with recent abnormal ultrasound. The patient needs to have his gallbladder evaluated with a HIDA scan. This will have to be done, from the inpatient setting. Doubt metabolic instability, serious bacterial infection or impending vascular collapse. His blood pressure is improved with a single dose of labetalol. This will need ongoing management in hospital  Nursing Notes Reviewed/ Care Coordinated, and agree without changes. Applicable Imaging Reviewed.  Interpretation of Laboratory Data incorporated into ED treatment    I personally performed the services described in this documentation, which was scribed in my presence. The recorded information has been reviewed and is accurate.           Flint Melter, MD 08/03/13 [phone]

## 2013-08-03 NOTE — ED Notes (Signed)
Pt completed 1 bottle of oral contrast. CT notified. Pt currently working on 2nd bottle.

## 2013-08-04 ENCOUNTER — Encounter (HOSPITAL_COMMUNITY): Payer: Self-pay

## 2013-08-04 ENCOUNTER — Inpatient Hospital Stay (HOSPITAL_COMMUNITY)
Admit: 2013-08-04 | Discharge: 2013-08-04 | Disposition: A | Discharge status: Home / Self Care | Payer: Self-pay | Attending: Family Medicine | Admitting: Family Medicine

## 2013-08-04 DIAGNOSIS — I10 Essential (primary) hypertension: Secondary | ICD-10-CM

## 2013-08-04 LAB — CBC
HCT: 36 % — ABNORMAL LOW (ref 39.0–52.0)
Hemoglobin: 12.3 g/dL — ABNORMAL LOW (ref 13.0–17.0)
MCH: 33.2 pg (ref 26.0–34.0)
MCV: 97.3 fL (ref 78.0–100.0)
RBC: 3.7 MIL/uL — ABNORMAL LOW (ref 4.22–5.81)

## 2013-08-04 LAB — RAPID URINE DRUG SCREEN, HOSP PERFORMED
Amphetamines: NOT DETECTED
Benzodiazepines: NOT DETECTED
Cocaine: POSITIVE — AB
Opiates: POSITIVE — AB

## 2013-08-04 LAB — COMPREHENSIVE METABOLIC PANEL
ALT: 16 U/L (ref 0–53)
BUN: 9 mg/dL (ref 6–23)
CO2: 27 mEq/L (ref 19–32)
Calcium: 8.2 mg/dL — ABNORMAL LOW (ref 8.4–10.5)
Creatinine, Ser: 0.81 mg/dL (ref 0.50–1.35)
GFR calc Af Amer: >90 mL/min (ref >90)
GFR calc non Af Amer: >90 mL/min (ref >90)
Glucose, Bld: 106 mg/dL — ABNORMAL HIGH (ref 70–99)
Sodium: 138 mEq/L (ref 135–145)

## 2013-08-04 LAB — URINALYSIS, ROUTINE W REFLEX MICROSCOPIC
Bilirubin Urine: NEGATIVE
Glucose, UA: 100 mg/dL — AB
Hgb urine dipstick: NEGATIVE
Ketones, ur: NEGATIVE mg/dL
Specific Gravity, Urine: 1.02 (ref 1.005–1.030)
pH: 6.5 (ref 5.0–8.0)

## 2013-08-04 MED ORDER — HYDROMORPHONE HCL PF 1 MG/ML IJ SOLN
1.0000 mg | INTRAMUSCULAR | Status: DC | PRN
  Administered 2013-08-04 – 2013-08-05 (×4): 1 mg via INTRAVENOUS
  Filled 2013-08-04 (×4): qty 1

## 2013-08-04 MED ORDER — TECHNETIUM TC 99M MEBROFENIN IV KIT
5.0000 | PACK | Freq: Once | INTRAVENOUS | Status: AC | PRN
  Administered 2013-08-04: 4.9 via INTRAVENOUS

## 2013-08-04 NOTE — Progress Notes (Signed)
08/04/13 1452 Notified Dr. Kerry Hough of patient able to void but did use urinal for urine sample to be obtained. Stated okay. Discussed with patient to use urinal with next void and notify nursing staff so urine sample could be obtained. States will use urinal and notify. Earnstine Regal, RN

## 2013-08-04 NOTE — Progress Notes (Signed)
UR chart review completed.  

## 2013-08-04 NOTE — Progress Notes (Signed)
08/04/13 1355 Instructed patient to use urinal and notify nursing staff when he had voided since urine sample needed as ordered. Reminded patient multiple times this morning, urinal provided for use. Stated would notify. Patient voided on return from nuclear medicine study this afternoon, but did not use urinal. Unable to send urine sample per orders at that time. Pt aware of need for urine sample. Earnstine Regal, RN

## 2013-08-04 NOTE — Progress Notes (Signed)
   CARE MANAGEMENT ED NOTE 08/04/2013  Patient:  Paul [name],Paul [name]   Account Number:  [id number]  Date Initiated:  08/04/2013  Documentation initiated by:  Anibal Henderson  Subjective/Objective Assessment:   ED CM saw pt did not have [name] PCP, nor insurance     Subjective/Objective Assessment Detail:   Spoke with pt and he states he does not have insurance nor [name] PCP. Handouts given for [facility] with clinic information, food pantries, and obtaining insurance. Pt appreciative of this.     Action/Plan:   Action/Plan Detail:   Pt admitted   Anticipated DC Date:       Status Recommendation to Physician:   Result of Recommendation:        Choice offered to / List presented to:            Status of service:    ED Comments:   ED Comments Detail:     CARE MANAGEMENT ED NOTE 08/04/2013  Patient:  Paul [name],Paul [name]   Account Number:  [id number]  Date Initiated:  08/04/2013  Documentation initiated by:  Anibal Henderson  Subjective/Objective Assessment:   ED CM saw pt did not have [name] PCP, nor insurance     Subjective/Objective Assessment Detail:   Spoke with pt and he states he does not have insurance nor [name] PCP. Handouts given for [facility] with clinic information, food pantries, and obtaining insurance. Pt appreciative of this.     Action/Plan:   Action/Plan Detail:   Pt admitted   Anticipated DC Date:       Status Recommendation to Physician:   Result of Recommendation:        Choice offered to / List presented to:            Status of service:    ED Comments:   ED Comments Detail:

## 2013-08-04 NOTE — Progress Notes (Signed)
[facility] PROGRESS NOTE  Paul [name] ZOX:[id number] DOB: [birth year] DOA: 08/03/2013 PCP: No PCP Per Patient  Assessment/Plan: 1. Acute alcoholic pancreatitis.  Patient is currently npo.  Continue IV fluids and pain meds.  Advance diet as tolerated. Pseudocysts noted on CT.  Do not appear to be infected.  Will need outpatient follow up. 2. Alcohol abuse. Patient counseled on the importance of abstinence from alcohol and the harmful effects it is having on his pancreas. The patient does not feel that he has an addiction to alcohol and can quit whenever he wants.  He appears to be in denial about his addiction. He is on CIWA protocol 3. Elevated LFTs.  Patient is scheduled for HIDA scan today to further evaluate this, but I suspect this may have been related to alcohol. 4. HTN.  Patient was very hypertensive on arrival.  Likely worse due to pain.  This appears to have improved now. 5. Seizure disorder.  Continue keppra 6. Hypokalemia, replace.  Code Status: full code Family Communication: discussed with patient, no family present Disposition Plan: discharge home once tolerating po   Consultants:  none  Procedures:  none  Antibiotics:  none  HPI/Subjective: Feeling better, abdominal pain is improving, no nausea or vomiting, wants to start eating  Objective: Filed Vitals:   08/04/13 0736  BP:   Pulse:   Temp: 98.3 F (36.8 C)  Resp:     Intake/Output Summary (Last 24 hours) at 08/04/13 0833 Last data filed at 08/04/13 8119  Gross per 24 hour  Intake 1761.66 ml  Output      0 ml  Net 1761.66 ml   Filed Weights   08/03/13 1519 08/03/13 2240 08/04/13 0500  Weight: 74.844 kg (165 lb) 70.9 kg (156 lb 4.9 oz) 71 kg (156 lb 8.4 oz)    Exam:   General:  NAD  Cardiovascular: S1, S2 RRR  Respiratory: CTA B  Abdomen: soft, nt, nd, bs+  Musculoskeletal: no edema b/l   Data Reviewed: Basic Metabolic Panel:  Recent Labs Lab 08/01/13 1019  08/01/13 1826 08/03/13 1627 08/04/13 0440  NA 138 137 138 138  K 3.5 3.2* 3.3* 3.3*  CL 103 99 96 102  CO2 27 29 33* 27  GLUCOSE 122* 118* 126* 106*  BUN 4* 4* 11 9  CREATININE 0.87 0.71 0.96 0.81  CALCIUM 9.1 9.0 9.3 8.2*   Liver Function Tests:  Recent Labs Lab 08/01/13 1019 08/01/13 1826 08/03/13 1627 08/04/13 0440  AST 257* 81* 13 14  ALT 75* 54* 25 16  ALKPHOS 422* 380* 280* 205*  BILITOT 1.1 1.6* 1.4* 1.3*  PROT 7.6 7.2 7.4 5.9*  ALBUMIN 3.6 3.5 3.4* 2.7*    Recent Labs Lab 08/01/13 1019 08/01/13 1826 08/03/13 1627  LIPASE 228* 232* 226*   No results found for this basename: AMMONIA,  in the last 168 hours CBC:  Recent Labs Lab 08/01/13 1019 08/01/13 1826 08/03/13 1627 08/04/13 0440  WBC 7.2 6.4 7.2 5.1  NEUTROABS 6.0 4.8 4.7  --   HGB 14.7 14.5 14.6 12.3*  HCT 42.5 42.2 42.9 36.0*  MCV 97.3 97.0 97.3 97.3  PLT 255 233 233 182   Cardiac Enzymes: No results found for this basename: CKTOTAL, CKMB, CKMBINDEX, TROPONINI,  in the last 168 hours BNP (last 3 results) No results found for this basename: PROBNP,  in the last 8760 hours CBG: No results found for this basename: GLUCAP,  in the last 168 hours  Recent Results (from the past  240 hour(s))  MRSA PCR SCREENING     Status: None   Collection Time    08/03/13 10:43 PM      Result Value Range Status   MRSA by PCR NEGATIVE  NEGATIVE Final   Comment:            The GeneXpert MRSA Assay (FDA     approved for NASAL specimens     only), is one component of a     comprehensive MRSA colonization     surveillance program. It is not     intended to diagnose MRSA     infection nor to guide or     monitor treatment for     MRSA infections.     Studies: Ct Abdomen Pelvis W Contrast  08/03/2013   *RADIOLOGY REPORT*  Clinical Data: Recent hospitalization for pancreatitis.  Umbilical abdomen pain.  CT ABDOMEN AND PELVIS WITH CONTRAST  Technique:  Multidetector CT imaging of the abdomen and pelvis was  performed following the standard protocol during bolus administration of intravenous contrast.  Contrast: 50mL OMNIPAQUE IOHEXOL 300 MG/ML  SOLN, OMNIPAQUE IOHEXOL 300 MG/ML  SOLN  Comparison: CT abdomen pelvis November 05, 2012  Findings: There is 3 x 2.7 cm heterogeneous low density in the area of pancreatic head.  There is dilatation of pancreatic duct.  There is edema and thickening of duodenal wall.  There are multiple calcifications in the pancreas.  There is mild peri pancreatic inflammation particularly at the pancreatic head.  There is mild intrahepatic biliary ductal dilatation.  The liver is otherwise normal.  The gallbladder is normal.  The spleen is normal.  The bilateral adrenal glands are normal.  The previously described right kidney stone is not seen.  There is no hydronephrosis bilaterally.  The aorta is normal.  There is no small bowel obstruction or diverticulitis.  The appendix is normal.  Partial fluid filled bladder is normal.  There is mild atelectasis of the posterior lung bases.  There is no pleural effusion.  There are degenerative joint changes of the L5-S1 level with vacuum disc phenomenon, narrowed joint space and grade 1 anterior listhesis of L5 on S1. 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  IMPRESSION: Findings consistent with the patient's known recent pancreatitis. There is a 3 x 2.7 cm heterogeneous density in the pancreatic head could be due to pancreatic pseudocyst or pancreatic necrosis. There is a 1.8 cm low density between the pancreatic head and second portion of duodenum which is probably a developing pseudocyst.  There is inflammation of the adjacent duodenum with diffuse duodenal bowel wall thickening and surrounding inflammation.   Original Report Authenticated By: Sherian Rein, M.D.    Scheduled Meds: . sodium chloride   Intravenous STAT  . cloNIDine  0.2 mg Oral Daily  . enoxaparin (LOVENOX) injection  40 mg Subcutaneous Q24H  . folic acid  1 mg Oral Daily   . levETIRAcetam  750 mg Oral Daily  . metoprolol tartrate  25 mg Oral BID  . multivitamin with minerals  1 tablet Oral Daily  . pantoprazole (PROTONIX) IV  40 mg Intravenous Q24H  . thiamine  100 mg Oral Daily   Or  . thiamine  100 mg Intravenous Daily   Continuous Infusions: . sodium chloride 125 mL/hr at 08/04/13 0607    Active Problems:   Acute alcoholic pancreatitis   HTN (hypertension)   Cocaine abuse   Convulsions/seizures   ETOH abuse    Time spent:  [facility]  [facility] Pager [phone]. If 7PM-7AM, please contact night-coverage at [email or web address], password [facility] 08/04/2013, 8:33 AM  LOS: 1 day

## 2013-08-05 LAB — CBC
Hemoglobin: 11.5 g/dL — ABNORMAL LOW (ref 13.0–17.0)
MCH: 33.2 pg (ref 26.0–34.0)
MCV: 97.4 fL (ref 78.0–100.0)
RBC: 3.46 MIL/uL — ABNORMAL LOW (ref 4.22–5.81)

## 2013-08-05 LAB — COMPREHENSIVE METABOLIC PANEL
AST: 15 U/L (ref 0–37)
Albumin: 2.4 g/dL — ABNORMAL LOW (ref 3.5–5.2)
Chloride: 107 mEq/L (ref 96–112)
Creatinine, Ser: 0.66 mg/dL (ref 0.50–1.35)
Total Bilirubin: 1.2 mg/dL (ref 0.3–1.2)

## 2013-08-05 NOTE — Progress Notes (Signed)
08/05/13 1021 Patient discharged home. Reviewed discharge instructions with patient. Noted when medications due on home medication list. Reviewed pancreatitis and smoking cessation education, education handouts provided. Pt verbalizes understanding of instructions. Follow-up appointment set up with [facility]. Instructed patient to follow-up as instructed. States will attend appointment. No c/o pain or discomfort at discharge. IV site d/c'd per pam tate, RN. Site within normal limits. Pt refused nursing staff to accompany off floor for discharge. Stated "i'm going upstairs to visit my friend after I go smoke".  Pt left floor in stable condition via ambulation at his request. Hildred Alamin

## 2013-08-05 NOTE — Plan of Care (Signed)
Problem: Discharge Progression Outcomes Goal: Pt. accepted/substance abuse rehab Outcome: Not Applicable Date Met:  08/05/13 Discussed smoking cessation with patient. Patient states "i can't wait to go smoke a cigarette, i don't want to quit". Smoking cessation education handout given to patient. Morrie Sheldon Criss Pallone,RN

## 2013-08-05 NOTE — Discharge Summary (Signed)
Physician Discharge Summary  IVON OELKERS WUJ:[id number] DOB: 07-16-1966 DOA: 08/03/2013  PCP: No PCP Per Patient  Admit date: 08/03/2013 Discharge date: 08/05/2013  Time spent: Less than 30 minutes  Recommendations for Outpatient Follow-up:  1. Patient will need to find primary care physician. We can help him with this if he desires to accept help.  Discharge Diagnoses:  1. Acute alcoholic pancreatitis, resolving. 2. Hypertension. 3. Cocaine abuse. 4. Alcohol abuse.   Discharge Condition: Stable and improving.  Diet recommendation: Regular, as tolerated. No alcohol.  Filed Weights   08/03/13 2240 08/04/13 0500 08/05/13 0500  Weight: 70.9 kg (156 lb 4.9 oz) 71 kg (156 lb 8.4 oz) 74.9 kg (165 lb 2 oz)    History of present illness:  This 47 year old man, who is well-known to the hospital, presents once again with symptoms of abdominal pain. Please see initial history as outlined below: HPI:  47 year old male with a history of chronic pancreatitis came to the ED today with abdominal pain which started 3 days ago. Patient has a history of alcohol abuse and his last drink was 4 days ago he drank 5 pints of wine on that day. He also had nausea vomiting today. He says the pain is located in the pericervical region and radiates to back. He denies any chest pain no shortness of breath. The pain has improved since he came to the ED. Patient recently had ultrasound of the abdomen for elevated alkaline phosphatase and LFTs, and radiology recommended HIDA scan due to gallbladder sludge.  Hospital Course:  Patient was treated appropriately with intravenous fluids, initially n.p.o. and diet was advanced as tolerated. He was given analgesia as required. He was counseled once again regarding alcohol abuse. Unfortunately I do not think he thinks that he has an alcohol problem. He also was counseled against cocaine abuse. He seems to not think this is important to his health in terms of discontinuing  alcoholism and cocaine abuse. He has tolerated a clear fluid diet and I suspect she will continue to improve now. The rest of his treatment can be continued outside the hospital. He is now stable for discharge. We shall attempt to find him a primary care physician with whom he can followup with.  Procedures:  None.   Consultations:  None.  Discharge Exam: Filed Vitals:   08/05/13 0400  BP: 145/91  Pulse:   Temp: 98 F (36.7 C)  Resp: 18    General: He looks systemically well. Is not in pain. Is not toxic or septic. Cardiovascular: Heart sounds are present without murmurs or added sounds. Respiratory: Lung fields are clear. Abdomen is soft and nontender. Bowel sounds are heard. He is alert and orientated. There is no evidence of alcohol withdrawal symptoms/signs. There are no focal neurological signs.  Discharge Instructions  Discharge Orders   Future Orders Complete By Expires     Stool culture  08/04/2013 08/03/2014    Diet - low sodium heart healthy  As directed     Increase activity slowly  As directed     Ova and parasite examination  As directed         Medication List    STOP taking these medications       HYDROcodone-acetaminophen 10-325 MG per tablet  Commonly known as:  NORCO      TAKE these medications       cloNIDine 0.2 MG tablet  Commonly known as:  CATAPRES  Take 0.2 mg by mouth daily. Prescribed one tablet  twice daily but patient only takes once daily.     levETIRAcetam 750 MG tablet  Commonly known as:  KEPPRA  Take 750 mg by mouth daily. Prescribed one tablet twice daily but the patient states that she takes one tablet daily     omeprazole 20 MG capsule  Commonly known as:  PRILOSEC  Take 1 capsule (20 mg total) by mouth daily. For treatment of inflammation of your stomach lining.     ondansetron 4 MG tablet  Commonly known as:  ZOFRAN  Take 1 tablet (4 mg total) by mouth every 8 (eight) hours as needed for nausea.      oxyCODONE-acetaminophen 5-325 MG per tablet  Commonly known as:  PERCOCET/ROXICET  Take 1-2 tablets by mouth every 4 (four) hours as needed for pain.       No Known Allergies     Follow-up Information   Follow up with No PCP Per Patient.   Contact information:   [address] [place] [address] [phone]        The results of significant diagnostics from this hospitalization (including imaging, microbiology, ancillary and laboratory) are listed below for reference.    Significant Diagnostic Studies: Ct Head Wo Contrast  07/16/2013   *RADIOLOGY REPORT*  Clinical Data: Head trauma, intracranial hemorrhage 4 weeks ago. Hypertension, blackouts, altered mental status  CT HEAD WITHOUT CONTRAST  Technique:  Contiguous axial images were obtained from the base of the skull through the vertex without contrast.  Comparison: 06/12/2013, 06/13/2013  Findings: Resolved subarachnoid and subdural hemorrhage compared to the prior study.  No new intracranial hemorrhage, mass effect, mass lesion, definite acute infarction, midline shift, herniation or hydrocephalus.  The inferior frontal lobes demonstrate white matter hypoattenuation diffusely, images 8-11 compatible with evolutionary changes from bifrontal closed head injury.  The cisterns are patent.  No cerebellar abnormality.  No extra-axial fluid collection.  Mastoids and sinuses are clear.  No skull abnormality.  IMPRESSION: Resolved subarachnoid and subdural hemorrhage compared to 06/12/2013.  Evolutionary changes in the inferior bifrontal white matter related to the closed head injury.  No current acute process by noncontrast CT.   Original Report Authenticated By: Judie Petit. Miles Costain, M.D.   Ct Abdomen Pelvis W Contrast  08/03/2013   *RADIOLOGY REPORT*  Clinical Data: Recent hospitalization for pancreatitis.  Umbilical abdomen pain.  CT ABDOMEN AND PELVIS WITH CONTRAST  Technique:  Multidetector CT imaging of the abdomen and pelvis was performed  following the standard protocol during bolus administration of intravenous contrast.  Contrast: 50mL OMNIPAQUE IOHEXOL 300 MG/ML  SOLN, OMNIPAQUE IOHEXOL 300 MG/ML  SOLN  Comparison: CT abdomen pelvis November 05, 2012  Findings: There is 3 x 2.7 cm heterogeneous low density in the area of pancreatic head.  There is dilatation of pancreatic duct.  There is edema and thickening of duodenal wall.  There are multiple calcifications in the pancreas.  There is mild peri pancreatic inflammation particularly at the pancreatic head.  There is mild intrahepatic biliary ductal dilatation.  The liver is otherwise normal.  The gallbladder is normal.  The spleen is normal.  The bilateral adrenal glands are normal.  The previously described right kidney stone is not seen.  There is no hydronephrosis bilaterally.  The aorta is normal.  There is no small bowel obstruction or diverticulitis.  The appendix is normal.  Partial fluid filled bladder is normal.  There is mild atelectasis of the posterior lung bases.  There is no pleural effusion.  There are  degenerative joint changes of the L5-S1 level with vacuum disc phenomenon, narrowed joint space and grade 1 anterior listhesis of L5 on S1. 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  IMPRESSION: Findings consistent with the patient's known recent pancreatitis. There is a 3 x 2.7 cm heterogeneous density in the pancreatic head could be due to pancreatic pseudocyst or pancreatic necrosis. There is a 1.8 cm low density between the pancreatic head and second portion of duodenum which is probably a developing pseudocyst.  There is inflammation of the adjacent duodenum with diffuse duodenal bowel wall thickening and surrounding inflammation.   Original Report Authenticated By: Sherian Rein, M.D.   Nm Hepato W/eject Fract  08/04/2013   *RADIOLOGY REPORT*  Clinical Data:  Abdominal pain, history hypertension, pancreatitis, hyperlipidemia, ethanol, drug, and tobacco abuse  NUCLEAR  MEDICINE HEPATOBILIARY IMAGING WITH GALLBLADDER EF:  Technique: Sequential images of the abdomen were obtained for 60 minutes following intravenous administration of radiopharmaceutical.  Patient then ingested 8 ounces of commercially available Ensure Plus and imaging was continued for 60 minutes.  A time-activity curve was generated from tracer within the gallbladder following Ensure Plus ingestion, and the gallbladder ejection fraction was calculated.  Radiopharmaceutical:  4.9 mCi Tc-59m mebrofenin  Comparison: None  Findings: Prompt tracer extraction from bloodstream, indicating normal hepatocellular function. Prompt excretion of tracer into biliary tree. Gallbladder visualized at 30 minutes. Small bowel visualized at 15 minutes. No hepatic retention of tracer.  Early emptying of tracer from gallbladder at time of fatty meal ingestion. Gallbladder then demonstrates partial refilling and then empties towards the conclusion of the exam. Subjectively gallbladder emptying is normal. Calculated gallbladder ejection fraction is 37%, normal. Patient experienced no symptoms following fatty meal ingestion.  IMPRESSION: Normal exam as above.  Normal values for gallbladder ejection fraction: > 30% for exams utilizing sincalide (CCK) > 33% for exams utilizing fatty meal stimulation with Ensure Plus   Original Report Authenticated By: Ulyses Southward, M.D.   US Abdomen Limited Ruq  07/16/2013   *RADIOLOGY REPORT*  Clinical Data:  Abdominal pain, evaluate for acute cholecystitis, history of hypertension and hyperlipidemia  LIMITED ABDOMINAL ULTRASOUND - RIGHT UPPER QUADRANT  Comparison:  CT abdomen pelvis - 11/05/2012  Findings:  Gallbladder:  There is a minimal amount of layering echogenic debris (images 48 and 77) within an otherwise normal-appearing gallbladder.  No gallbladder wall thickening or pericholecystic fluid.  Negative sonographic Murphy's sign.  Common bile duct:  Normal in size measuring 5.6 mm in diameter   Liver:  No focal mass lesion seen.  Within normal limits in parenchymal echogenicity.  IMPRESSION: Suspected gallbladder sludge with an otherwise normal-appearing gallbladder.  Further evaluation may be obtained with a HIDA scan as clinically indicated.                    Original Report Authenticated By: Tacey Ruiz, MD    Microbiology: Recent Results (from the past 240 hour(s))  MRSA PCR SCREENING     Status: None   Collection Time    08/03/13 10:43 PM      Result Value Range Status   MRSA by PCR NEGATIVE  NEGATIVE Final   Comment:            The GeneXpert MRSA Assay (FDA     approved for NASAL specimens     only), is one component of a     comprehensive MRSA colonization     surveillance program. It is not     intended to  diagnose MRSA     infection nor to guide or     monitor treatment for     MRSA infections.     Labs: Basic Metabolic Panel:  Recent Labs Lab 08/01/13 1019 08/01/13 1826 08/03/13 1627 08/04/13 0440 08/05/13 0429  NA 138 137 138 138 137  K 3.5 3.2* 3.3* 3.3* 3.5  CL 103 99 96 102 107  CO2 27 29 33* 27 26  GLUCOSE 122* 118* 126* 106* 92  BUN 4* 4* 11 9 6   CREATININE 0.87 0.71 0.96 0.81 0.66  CALCIUM 9.1 9.0 9.3 8.2* 8.1*   Liver Function Tests:  Recent Labs Lab 08/01/13 1019 08/01/13 1826 08/03/13 1627 08/04/13 0440 08/05/13 0429  AST 257* 81* 13 14 15   ALT 75* 54* 25 16 13   ALKPHOS 422* 380* 280* 205* 160*  BILITOT 1.1 1.6* 1.4* 1.3* 1.2  PROT 7.6 7.2 7.4 5.9* 5.2*  ALBUMIN 3.6 3.5 3.4* 2.7* 2.4*    Recent Labs Lab 08/01/13 1019 08/01/13 1826 08/03/13 1627  LIPASE 228* 232* 226*    CBC:  Recent Labs Lab 08/01/13 1019 08/01/13 1826 08/03/13 1627 08/04/13 0440 08/05/13 0429  WBC 7.2 6.4 7.2 5.1 3.7*  NEUTROABS 6.0 4.8 4.7  --   --   HGB 14.7 14.5 14.6 12.3* 11.5*  HCT 42.5 42.2 42.9 36.0* 33.7*  MCV 97.3 97.0 97.3 97.3 97.4  PLT 255 233 233 182 162         Signed:  Fabienne Nolasco C  [facility] 08/05/2013, 8:58 AM

## 2013-08-05 NOTE — Progress Notes (Signed)
0115Patient is complaining of abdominal pain, not time for pain medication, text paged MD on call to see if he can have anything else 0150 Patient even regular dose of PRN dilaudid for pain, MD returned page and notified of pain medication given

## 2013-08-05 NOTE — Care Management Note (Signed)
    Page 1 of 1   08/05/2013     11:00:24 AM   CARE MANAGEMENT NOTE 08/05/2013  Patient:  CRISTEN, MURCIA A   Account Number:  [id number]  Date Initiated:  08/05/2013  Documentation initiated by:  Sharrie Rothman  Subjective/Objective Assessment:   Pt admitted with pancreatitis due to etoh abuse. Pt lives with friends and will return home at discharge. Pt has no insurance and no PCP.     Action/Plan:   Pt has followup appt at [facility]. Pt is not eligible for [facility] voucher due to just recently using one. Pt discharged today. No other CM needs noted. Pt not interested in drinking and smoking cessation.   Anticipated DC Date:  08/05/2013   Anticipated DC Plan:  HOME/SELF CARE  In-house referral  Financial Counselor      DC Planning Services  CM consult      Choice offered to / List presented to:             Status of service:  Completed, signed off Medicare Important Message given?   (If response is "NO", the following Medicare IM given date fields will be blank) Date Medicare IM given:   Date Additional Medicare IM given:    Discharge Disposition:  HOME/SELF CARE  Per UR Regulation:    If discussed at Long Length of Stay Meetings, dates discussed:    Comments:  08/05/13 1100 Arlyss Queen, RN BSN CM

## 2013-08-08 ENCOUNTER — Other Ambulatory Visit (INDEPENDENT_AMBULATORY_CARE_PROVIDER_SITE_OTHER): Payer: Self-pay | Admitting: Orthopedic Surgery

## 2013-08-09 ENCOUNTER — Telehealth (INDEPENDENT_AMBULATORY_CARE_PROVIDER_SITE_OTHER): Payer: Self-pay

## 2013-08-09 NOTE — Telephone Encounter (Signed)
I spoke with Paul [name] about this patients request for a refill on lisinopril. He informed me that patient needs to get this from his primary care doctor. I attempted to call the patient to make him aware, patient did not answer and his voicemail is not set up at this time.

## 2013-08-14 ENCOUNTER — Encounter (HOSPITAL_COMMUNITY): Payer: Self-pay

## 2013-08-14 ENCOUNTER — Emergency Department (HOSPITAL_COMMUNITY)
Admission: EM | Admit: 2013-08-14 | Discharge: 2013-08-14 | Disposition: A | Discharge status: Home / Self Care | Payer: Self-pay | Attending: Emergency Medicine | Admitting: Emergency Medicine

## 2013-08-14 DIAGNOSIS — Z87828 Personal history of other (healed) physical injury and trauma: Secondary | ICD-10-CM | POA: Insufficient documentation

## 2013-08-14 DIAGNOSIS — Z862 Personal history of diseases of the blood and blood-forming organs and certain disorders involving the immune mechanism: Secondary | ICD-10-CM | POA: Insufficient documentation

## 2013-08-14 DIAGNOSIS — Z8639 Personal history of other endocrine, nutritional and metabolic disease: Secondary | ICD-10-CM | POA: Insufficient documentation

## 2013-08-14 DIAGNOSIS — Z8659 Personal history of other mental and behavioral disorders: Secondary | ICD-10-CM | POA: Insufficient documentation

## 2013-08-14 DIAGNOSIS — Z8709 Personal history of other diseases of the respiratory system: Secondary | ICD-10-CM | POA: Insufficient documentation

## 2013-08-14 DIAGNOSIS — Z79899 Other long term (current) drug therapy: Secondary | ICD-10-CM | POA: Insufficient documentation

## 2013-08-14 DIAGNOSIS — Z8781 Personal history of (healed) traumatic fracture: Secondary | ICD-10-CM | POA: Insufficient documentation

## 2013-08-14 DIAGNOSIS — K859 Acute pancreatitis without necrosis or infection, unspecified: Secondary | ICD-10-CM | POA: Insufficient documentation

## 2013-08-14 DIAGNOSIS — F17200 Nicotine dependence, unspecified, uncomplicated: Secondary | ICD-10-CM | POA: Insufficient documentation

## 2013-08-14 DIAGNOSIS — Z87442 Personal history of urinary calculi: Secondary | ICD-10-CM | POA: Insufficient documentation

## 2013-08-14 DIAGNOSIS — F1410 Cocaine abuse, uncomplicated: Secondary | ICD-10-CM | POA: Insufficient documentation

## 2013-08-14 DIAGNOSIS — G40909 Epilepsy, unspecified, not intractable, without status epilepticus: Secondary | ICD-10-CM | POA: Insufficient documentation

## 2013-08-14 DIAGNOSIS — I10 Essential (primary) hypertension: Secondary | ICD-10-CM | POA: Insufficient documentation

## 2013-08-14 LAB — CBC WITH DIFFERENTIAL/PLATELET
Basophils Absolute: 0 10*3/uL (ref 0.0–0.1)
Basophils Relative: 0 % (ref 0–1)
Eosinophils Relative: 2 % (ref 0–5)
HCT: 41.7 % (ref 39.0–52.0)
MCH: 33.2 pg (ref 26.0–34.0)
MCHC: 34.5 g/dL (ref 30.0–36.0)
MCV: 96.1 fL (ref 78.0–100.0)
Monocytes Absolute: 0.6 10*3/uL (ref 0.1–1.0)
RDW: 13.3 % (ref 11.5–15.5)

## 2013-08-14 LAB — COMPREHENSIVE METABOLIC PANEL
ALT: 8 U/L (ref 0–53)
Alkaline Phosphatase: 151 U/L — ABNORMAL HIGH (ref 39–117)
BUN: 6 mg/dL (ref 6–23)
CO2: 27 mEq/L (ref 19–32)
Chloride: 100 mEq/L (ref 96–112)
GFR calc Af Amer: >90 mL/min (ref >90)
Glucose, Bld: 89 mg/dL (ref 70–99)
Potassium: 3.9 mEq/L (ref 3.5–5.1)
Sodium: 140 mEq/L (ref 135–145)
Total Bilirubin: 0.5 mg/dL (ref 0.3–1.2)
Total Protein: 7.7 g/dL (ref 6.0–8.3)

## 2013-08-14 LAB — LIPASE, BLOOD: Lipase: 427 U/L — ABNORMAL HIGH (ref 11–59)

## 2013-08-14 MED ORDER — OXYCODONE-ACETAMINOPHEN 5-325 MG PO TABS: 1.0000 | ORAL_TABLET | ORAL | Status: DC | PRN

## 2013-08-14 MED ORDER — ONDANSETRON HCL 4 MG/2ML IJ SOLN
4.0000 mg | Freq: Once | INTRAMUSCULAR | Status: AC
  Administered 2013-08-14: 4 mg via INTRAVENOUS
  Filled 2013-08-14: qty 2

## 2013-08-14 MED ORDER — HYDROMORPHONE HCL PF 1 MG/ML IJ SOLN
1.0000 mg | Freq: Once | INTRAMUSCULAR | Status: AC
  Administered 2013-08-14: 1 mg via INTRAVENOUS
  Filled 2013-08-14: qty 1

## 2013-08-14 MED ORDER — PROMETHAZINE HCL 25 MG PO TABS: 25.0000 mg | ORAL_TABLET | Freq: Four times a day (QID) | ORAL | Status: DC | PRN

## 2013-08-14 MED ORDER — LORAZEPAM 2 MG/ML IJ SOLN
0.5000 mg | Freq: Once | INTRAMUSCULAR | Status: AC
  Administered 2013-08-14: 0.5 mg via INTRAVENOUS
  Filled 2013-08-14: qty 1

## 2013-08-14 MED ORDER — ONDANSETRON HCL 4 MG/2ML IJ SOLN
INTRAMUSCULAR | Status: AC
  Administered 2013-08-14: 4 mg
  Filled 2013-08-14: qty 2

## 2013-08-14 NOTE — ED Notes (Signed)
Pt now apologies for his behavior. calmer

## 2013-08-14 NOTE — ED Notes (Signed)
Recurrent pain upper abd, now with vomiting.

## 2013-08-14 NOTE — ED Provider Notes (Signed)
CSN: [id number]     Arrival date & time 08/14/13  0015 History     First MD Initiated Contact with Patient 08/14/13 0038     Chief Complaint  Patient presents with  . Abdominal Pain   (Consider location/radiation/quality/duration/timing/severity/associated sxs/prior Treatment) Patient is a 47 y.o. male presenting with abdominal pain. The history is provided by the patient (pt complains of abd pain).  Abdominal Pain Pain location:  Epigastric Pain quality: aching   Pain radiates to:  Does not radiate Pain severity:  Moderate Onset quality:  Sudden Timing:  Constant Associated symptoms: no chest pain, no cough, no diarrhea, no fatigue and no hematuria     Past Medical History  Diagnosis Date  . Pancreatitis, acute     First episode earlier in 2012, hospitalized again in 02/2012 and 09/2012  . Hypertension     noncompliance  . Hyperlipidemia     noncompliance  . Lung collapse     h/o  . ETOH abuse   . Cocaine abuse   . DTs (delirium tremens)     history of  . Nephrolithiasis   . Tobacco abuse   . Seizure disorder 07/17/2013  . Noncompliance 07/17/2013  . Traumatic subdural hematoma 06/15/2013  . 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 Open skull fracture 06/15/2013  . Polysubstance abuse     etoh, cocaine   Past Surgical History  Procedure Laterality Date  . Mandible fracture surgery  2006  . Left axillary      surgery for deep laceration   Family History  Problem Relation Age of Onset  . Diabetes type II Mother   . Liver disease Neg Hx   . Colon cancer Neg Hx   . GI problems Neg Hx    History  Substance Use Topics  . Smoking status: Current Every Day Smoker -- 1.00 packs/day    Types: Cigarettes  . Smokeless tobacco: Not on file  . Alcohol Use: Yes     Comment: Three 40oz per day    Review of Systems  Constitutional: Negative for appetite change and fatigue.  HENT: Negative for congestion, sinus pressure and ear discharge.   Eyes: Negative for  discharge.  Respiratory: Negative for cough.   Cardiovascular: Negative for chest pain.  Gastrointestinal: Positive for abdominal pain. Negative for diarrhea.  Genitourinary: Negative for frequency and hematuria.  Musculoskeletal: Negative for back pain.  Skin: Negative for rash.  Neurological: Negative for seizures and headaches.  Psychiatric/Behavioral: Negative for hallucinations.    Allergies  Review of patient's allergies indicates no known allergies.  Home Medications   Current Outpatient Rx  Name  Route  Sig  Dispense  Refill  . cloNIDine (CATAPRES) 0.2 MG tablet   Oral   Take 0.2 mg by mouth daily. Prescribed one tablet twice daily but patient only takes once daily.         Marland Kitchen levETIRAcetam (KEPPRA) 750 MG tablet   Oral   Take 750 mg by mouth daily. Prescribed one tablet twice daily but the patient states that she takes one tablet daily         . omeprazole (PRILOSEC) 20 MG capsule   Oral   Take 1 capsule (20 mg total) by mouth daily. For treatment of inflammation of your stomach lining.   30 capsule   3   . ondansetron (ZOFRAN) 4 MG tablet   Oral   Take 1 tablet (4 mg total) by mouth every 8 (eight) hours as needed for nausea.  6 tablet   0   . oxyCODONE-acetaminophen (PERCOCET/ROXICET) 5-325 MG per tablet   Oral   Take 1-2 tablets by mouth every 4 (four) hours as needed for pain.         Marland Kitchen oxyCODONE-acetaminophen (PERCOCET/ROXICET) 5-325 MG per tablet   Oral   Take 1 tablet by mouth every 4 (four) hours as needed for pain.   20 tablet   0   . oxyCODONE-acetaminophen (PERCOCET/ROXICET) 5-325 MG per tablet   Oral   Take 1 tablet by mouth every 4 (four) hours as needed for pain.   6 tablet   0   . promethazine (PHENERGAN) 25 MG tablet   Oral   Take 1 tablet (25 mg total) by mouth every 6 (six) hours as needed for nausea.   15 tablet   0    BP 170/116  Pulse 93  Temp(Src) 97.8 F (36.6 C) (Oral)  Resp 20  Ht 6' (1.829 m)  Wt 167 lb  (75.751 kg)  BMI 22.64 kg/m2  SpO2 98% Physical Exam  Constitutional: He is oriented to person, place, and time. He appears well-developed.  HENT:  Head: Normocephalic.  Eyes: Conjunctivae and EOM are normal. No scleral icterus.  Neck: Neck supple. No thyromegaly present.  Cardiovascular: Normal rate and regular rhythm.  Exam reveals no gallop and no friction rub.   No murmur heard. Pulmonary/Chest: No stridor. He has no wheezes. He has no rales. He exhibits no tenderness.  Abdominal: He exhibits no distension. There is tenderness. There is no rebound.  Musculoskeletal: Normal range of motion. He exhibits no edema.  Lymphadenopathy:    He has no cervical adenopathy.  Neurological: He is oriented to person, place, and time. Coordination normal.  Skin: No rash noted. No erythema.  Psychiatric: He has a normal mood and affect. His behavior is normal.    ED Course   Procedures (including critical care time)  Labs Reviewed  COMPREHENSIVE METABOLIC PANEL - Abnormal; Notable for the following:    Alkaline Phosphatase 151 (*)    All other components within normal limits  LIPASE, BLOOD - Abnormal; Notable for the following:    Lipase 427 (*)    All other components within normal limits  CBC WITH DIFFERENTIAL   No results found. 1. Pancreatitis     MDM  Pancreatitis,  Pt improving with tx and wants out pt tx  Benny Lennert, MD 08/14/13 [phone]

## 2013-08-14 NOTE — ED Notes (Signed)
Pt remains argumentative, demanding. Security called to bedside. Pt calmer with physician exam.

## 2013-08-14 NOTE — ED Notes (Addendum)
Had to awaken patient to take vitals. BP is elevated , made  RN aware. Patient is now calling for pain medication at this time.

## 2013-08-14 NOTE — ED Notes (Signed)
Argumentative, states he has no family, no friends, no neighbors who can come pick him up. "you haven't done nothing for me, just throwing me back out in the world" I explained, again, that he's had multiple medications, and is being sent home with percocet to go and a prescription for more. Cautioned pt again to refrain from drinking alcohol. Pt states last alcohol was yesterday. Pt able to get up, dress self and go to bathroom unassisted. Was escorted to waiting room where he again apologized for "giving you a rough time". States he will sit in waiting room for awhile before going home. (pt lives in neighborhood)

## 2013-08-17 MED FILL — Oxycodone w/ Acetaminophen Tab 5-325 MG: ORAL | Qty: 6 | Status: AC

## 2013-08-22 ENCOUNTER — Encounter (HOSPITAL_COMMUNITY): Payer: Self-pay | Admitting: *Deleted

## 2013-08-22 ENCOUNTER — Emergency Department (HOSPITAL_COMMUNITY)
Admission: EM | Admit: 2013-08-22 | Discharge: 2013-08-23 | Disposition: A | Discharge status: Home / Self Care | Payer: Self-pay | Attending: Emergency Medicine | Admitting: Emergency Medicine

## 2013-08-22 DIAGNOSIS — F1410 Cocaine abuse, uncomplicated: Secondary | ICD-10-CM | POA: Insufficient documentation

## 2013-08-22 DIAGNOSIS — R300 Dysuria: Secondary | ICD-10-CM | POA: Insufficient documentation

## 2013-08-22 DIAGNOSIS — K859 Acute pancreatitis without necrosis or infection, unspecified: Secondary | ICD-10-CM | POA: Insufficient documentation

## 2013-08-22 DIAGNOSIS — Z79899 Other long term (current) drug therapy: Secondary | ICD-10-CM | POA: Insufficient documentation

## 2013-08-22 DIAGNOSIS — G40909 Epilepsy, unspecified, not intractable, without status epilepticus: Secondary | ICD-10-CM | POA: Insufficient documentation

## 2013-08-22 DIAGNOSIS — Z87442 Personal history of urinary calculi: Secondary | ICD-10-CM | POA: Insufficient documentation

## 2013-08-22 DIAGNOSIS — Z8659 Personal history of other mental and behavioral disorders: Secondary | ICD-10-CM | POA: Insufficient documentation

## 2013-08-22 DIAGNOSIS — K5900 Constipation, unspecified: Secondary | ICD-10-CM | POA: Insufficient documentation

## 2013-08-22 DIAGNOSIS — Z8719 Personal history of other diseases of the digestive system: Secondary | ICD-10-CM | POA: Insufficient documentation

## 2013-08-22 DIAGNOSIS — E876 Hypokalemia: Secondary | ICD-10-CM | POA: Insufficient documentation

## 2013-08-22 DIAGNOSIS — F17200 Nicotine dependence, unspecified, uncomplicated: Secondary | ICD-10-CM | POA: Insufficient documentation

## 2013-08-22 DIAGNOSIS — Z862 Personal history of diseases of the blood and blood-forming organs and certain disorders involving the immune mechanism: Secondary | ICD-10-CM | POA: Insufficient documentation

## 2013-08-22 DIAGNOSIS — R112 Nausea with vomiting, unspecified: Secondary | ICD-10-CM | POA: Insufficient documentation

## 2013-08-22 DIAGNOSIS — I10 Essential (primary) hypertension: Secondary | ICD-10-CM | POA: Insufficient documentation

## 2013-08-22 DIAGNOSIS — Z8639 Personal history of other endocrine, nutritional and metabolic disease: Secondary | ICD-10-CM | POA: Insufficient documentation

## 2013-08-22 DIAGNOSIS — M549 Dorsalgia, unspecified: Secondary | ICD-10-CM | POA: Insufficient documentation

## 2013-08-22 DIAGNOSIS — G8929 Other chronic pain: Secondary | ICD-10-CM | POA: Insufficient documentation

## 2013-08-22 DIAGNOSIS — Z8781 Personal history of (healed) traumatic fracture: Secondary | ICD-10-CM | POA: Insufficient documentation

## 2013-08-22 LAB — CBC
Hemoglobin: 12.3 g/dL — ABNORMAL LOW (ref 13.0–17.0)
MCH: 32.5 pg (ref 26.0–34.0)
MCV: 95 fL (ref 78.0–100.0)
RBC: 3.78 MIL/uL — ABNORMAL LOW (ref 4.22–5.81)

## 2013-08-22 MED ORDER — ONDANSETRON HCL 4 MG/2ML IJ SOLN
4.0000 mg | Freq: Once | INTRAMUSCULAR | Status: AC
Start: 2013-08-22 — End: 2013-08-22
  Administered 2013-08-22: 4 mg via INTRAVENOUS
  Filled 2013-08-22: qty 2

## 2013-08-22 MED ORDER — FENTANYL CITRATE 0.05 MG/ML IJ SOLN
50.0000 ug | INTRAMUSCULAR | Status: DC | PRN
  Administered 2013-08-22 – 2013-08-23 (×2): 50 ug via INTRAVENOUS
  Filled 2013-08-22 (×2): qty 2

## 2013-08-22 MED ORDER — SODIUM CHLORIDE 0.9 % IV BOLUS (SEPSIS)
1000.0000 mL | Freq: Once | INTRAVENOUS | Status: AC
  Administered 2013-08-22: 1000 mL via INTRAVENOUS

## 2013-08-22 NOTE — ED Notes (Signed)
Patient states he drank a pint of wine earlier this morning and now presents to ER with c/o abdominal pain; states his pancreas is hurting.  Patient states he has had some urinary retention; states he only sprinkles.  Patient states he had an episode of vomiting just before coming to ER.

## 2013-08-22 NOTE — ED Notes (Addendum)
Pt c/o abdominal pain and mid to lower back pain. Pt states he can barely urinate and his last BM was besides a very small amount this morning, he states it has been 5 days since he had a BM. Pt also reports n/v.

## 2013-08-22 NOTE — ED Provider Notes (Signed)
CSN: [id number]     Arrival date & time 08/22/13  2207 History  This chart was scribed for Paul Nielsen, MD, by Yevette Edwards, ED Scribe. This patient was seen in room [facility] and the patient's care was started at 11:04 PM.   First MD Initiated Contact with Patient 08/22/13 2251     Chief Complaint  Patient presents with  . Abdominal Pain  . Back Pain  . Nausea  . Emesis   Patient is a 47 y.o. male presenting with abdominal pain. The history is provided by the patient. No language interpreter was used.  Abdominal Pain Pain location:  Epigastric and LUQ Pain quality: sharp   Pain radiates to:  Back Pain severity:  Severe Onset quality:  Sudden Duration:  3 days Chronicity:  Chronic Context: alcohol use   Associated symptoms: constipation, dysuria, nausea and vomiting   Associated symptoms: no hematemesis   Risk factors: alcohol abuse    HPI Comments:  Paul [name] is a 47 y.o. male, with a h/o pancreatitis, who presents to the Emergency Department complaining of chronic and constant left-sided abdominal pain which began three days ago and which radiates to his lumbar back. The pt rates his pain as a 10/10, and he describes the pain as "sharp." He reports that he has also experienced  nausea, emesis, dysuria, and constipation as associated symptoms. The pt did not notice any hematemesis. He was treated at the ED approximately a week ago for similar symptoms. The pt's last drink was a pint of wine this morning. He denies a h/o abdominal surgeries.   He denies a PCP or gastroenterologist.   Past Medical History  Diagnosis Date  . Pancreatitis, acute     First episode earlier in 2012, hospitalized again in 02/2012 and 09/2012  . Hypertension     noncompliance  . Hyperlipidemia     noncompliance  . Lung collapse     h/o  . ETOH abuse   . Cocaine abuse   . DTs (delirium tremens)     history of  . Nephrolithiasis   . Tobacco abuse   . Seizure disorder 07/17/2013  .  Noncompliance 07/17/2013  . Traumatic subdural hematoma 06/15/2013  . 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 Open skull fracture 06/15/2013  . Polysubstance abuse     etoh, cocaine   Past Surgical History  Procedure Laterality Date  . Mandible fracture surgery  2006  . Left axillary      surgery for deep laceration   Family History  Problem Relation Age of Onset  . Diabetes type II Mother   . Liver disease Neg Hx   . Colon cancer Neg Hx   . GI problems Neg Hx    History  Substance Use Topics  . Smoking status: Current Every Day Smoker -- 1.00 packs/day    Types: Cigarettes  . Smokeless tobacco: Not on file  . Alcohol Use: Yes     Comment: Three 40oz per day    Review of Systems  Gastrointestinal: Positive for nausea, vomiting, abdominal pain and constipation. Negative for hematemesis.  Genitourinary: Positive for dysuria.    Allergies  Review of patient's allergies indicates no known allergies.  Home Medications   Current Outpatient Rx  Name  Route  Sig  Dispense  Refill  . cloNIDine (CATAPRES) 0.2 MG tablet   Oral   Take 0.2 mg by mouth daily. Prescribed one tablet twice daily but patient only takes once daily.         Marland Kitchen  levETIRAcetam (KEPPRA) 750 MG tablet   Oral   Take 750 mg by mouth daily. Prescribed one tablet twice daily but the patient states that she takes one tablet daily         . omeprazole (PRILOSEC) 20 MG capsule   Oral   Take 1 capsule (20 mg total) by mouth daily. For treatment of inflammation of your stomach lining.   30 capsule   3   . ondansetron (ZOFRAN) 4 MG tablet   Oral   Take 1 tablet (4 mg total) by mouth every 8 (eight) hours as needed for nausea.   6 tablet   0   . promethazine (PHENERGAN) 25 MG tablet   Oral   Take 1 tablet (25 mg total) by mouth every 6 (six) hours as needed for nausea.   15 tablet   0    Triage Vitals: BP 180/130  Pulse 114  Temp(Src) 98.2 F (36.8 C) (Oral)  Resp 20  Ht 6' (1.829 m)  Wt  167 lb (75.751 kg)  BMI 22.64 kg/m2  SpO2 99%  Physical Exam  Nursing note and vitals reviewed. Constitutional: He is oriented to person, place, and time. He appears well-developed and well-nourished. No distress.  HENT:  Head: Normocephalic and atraumatic.  Mouth/Throat: Oropharynx is clear and moist.  Moist mucous membranes.   Eyes: EOM are normal. No scleral icterus.  Neck: Neck supple. No tracheal deviation present.  Cardiovascular: Regular rhythm and normal heart sounds.   Tachycardic  Pulmonary/Chest: Effort normal and breath sounds normal. No respiratory distress.  Abdominal: There is tenderness.  LUQ epigastric tenderness.  Musculoskeletal: Normal range of motion.  Neurological: He is alert and oriented to person, place, and time.  Skin: Skin is warm and dry. No rash noted.  Psychiatric: He has a normal mood and affect. His behavior is normal.    ED Course   DIAGNOSTIC STUDIES:  Oxygen Saturation is 99% on room air, normal by my interpretation.    COORDINATION OF CARE:  11:06 PM- Discussed treatment plan with patient, and the patient agreed to the plan.   Procedures (including critical care time)  Results for orders placed during the hospital encounter of 08/22/13  LIPASE, BLOOD      Result Value Range   Lipase 199 (*) 11 - 59 U/L  CBC      Result Value Range   WBC 4.8  4.0 - 10.5 K/uL   RBC 3.78 (*) 4.22 - 5.81 MIL/uL   Hemoglobin 12.3 (*) 13.0 - 17.0 g/dL   HCT 16.1 (*) 09.6 - 04.5 %   MCV 95.0  78.0 - 100.0 fL   MCH 32.5  26.0 - 34.0 pg   MCHC 34.3  30.0 - 36.0 g/dL   RDW 40.9  81.1 - 91.4 %   Platelets 307  150 - 400 K/uL  COMPREHENSIVE METABOLIC PANEL      Result Value Range   Sodium 140  135 - 145 mEq/L   Potassium 3.2 (*) 3.5 - 5.1 mEq/L   Chloride 103  96 - 112 mEq/L   CO2 27  19 - 32 mEq/L   Glucose, Bld 127 (*) 70 - 99 mg/dL   BUN 8  6 - 23 mg/dL   Creatinine, Ser 7.82  0.50 - 1.35 mg/dL   Calcium 8.9  8.4 - 95.6 mg/dL   Total Protein 7.1   6.0 - 8.3 g/dL   Albumin 2.8 (*) 3.5 - 5.2 g/dL   AST 15  0 -  37 U/L   ALT 7  0 - 53 U/L   Alkaline Phosphatase 121 (*) 39 - 117 U/L   Total Bilirubin 0.2 (*) 0.3 - 1.2 mg/dL   GFR calc non Af Amer >90  >90 mL/min   GFR calc Af Amer >90  >90 mL/min   1:35 AM after IV fluids and IV narcotics is feeling better, still some pain. Patient is requesting be discharged home. On exam his abdomen has some mild left upper quadrant tenderness but significantly improved.  Patient states understanding all discharge and followup instructions. I offered referral but patient states that he has a list of phone numbers. He agrees to return precautions.  1. pancreatitis  MDM  Abdominal pain with history of pancreatitis and elevated lipase  Improved with IV fluids and IV narcotics  Labs obtained and reviewed as above. Potassium provided for mild hypokalemia.  Vital signs, previous records and nursing notes reviewed  I personally performed the services described in this documentation, which was scribed in my presence. The recorded information has been reviewed and is accurate.    Paul Nielsen, MD 08/23/13 [phone]

## 2013-08-23 ENCOUNTER — Inpatient Hospital Stay (HOSPITAL_COMMUNITY)
Admission: EM | Admit: 2013-08-23 | Discharge: 2013-08-26 | DRG: 439 | Disposition: A | Discharge status: Home / Self Care | Payer: MEDICAID | Attending: Internal Medicine | Admitting: Internal Medicine

## 2013-08-23 ENCOUNTER — Encounter (HOSPITAL_COMMUNITY): Payer: Self-pay | Admitting: *Deleted

## 2013-08-23 DIAGNOSIS — K852 Alcohol induced acute pancreatitis without necrosis or infection: Secondary | ICD-10-CM

## 2013-08-23 DIAGNOSIS — F17200 Nicotine dependence, unspecified, uncomplicated: Secondary | ICD-10-CM | POA: Diagnosis present

## 2013-08-23 DIAGNOSIS — R748 Abnormal levels of other serum enzymes: Secondary | ICD-10-CM | POA: Diagnosis present

## 2013-08-23 DIAGNOSIS — I10 Essential (primary) hypertension: Secondary | ICD-10-CM | POA: Diagnosis present

## 2013-08-23 DIAGNOSIS — F1010 Alcohol abuse, uncomplicated: Secondary | ICD-10-CM | POA: Diagnosis present

## 2013-08-23 DIAGNOSIS — Z720 Tobacco use: Secondary | ICD-10-CM | POA: Diagnosis present

## 2013-08-23 DIAGNOSIS — Z87898 Personal history of other specified conditions: Secondary | ICD-10-CM

## 2013-08-23 DIAGNOSIS — K5900 Constipation, unspecified: Secondary | ICD-10-CM | POA: Diagnosis present

## 2013-08-23 DIAGNOSIS — Z91199 Patient's noncompliance with other medical treatment and regimen due to unspecified reason: Secondary | ICD-10-CM

## 2013-08-23 DIAGNOSIS — K859 Acute pancreatitis without necrosis or infection, unspecified: Principal | ICD-10-CM | POA: Diagnosis present

## 2013-08-23 DIAGNOSIS — F1410 Cocaine abuse, uncomplicated: Secondary | ICD-10-CM

## 2013-08-23 DIAGNOSIS — K860 Alcohol-induced chronic pancreatitis: Secondary | ICD-10-CM

## 2013-08-23 DIAGNOSIS — K862 Cyst of pancreas: Secondary | ICD-10-CM | POA: Diagnosis present

## 2013-08-23 DIAGNOSIS — G40909 Epilepsy, unspecified, not intractable, without status epilepticus: Secondary | ICD-10-CM | POA: Diagnosis present

## 2013-08-23 DIAGNOSIS — F10229 Alcohol dependence with intoxication, unspecified: Secondary | ICD-10-CM

## 2013-08-23 DIAGNOSIS — Z9119 Patient's noncompliance with other medical treatment and regimen: Secondary | ICD-10-CM

## 2013-08-23 DIAGNOSIS — K861 Other chronic pancreatitis: Secondary | ICD-10-CM

## 2013-08-23 DIAGNOSIS — E785 Hyperlipidemia, unspecified: Secondary | ICD-10-CM | POA: Diagnosis present

## 2013-08-23 LAB — COMPREHENSIVE METABOLIC PANEL
ALT: 7 U/L (ref 0–53)
ALT: 12 U/L (ref 0–53)
AST: 46 U/L — ABNORMAL HIGH (ref 0–37)
Albumin: 2.9 g/dL — ABNORMAL LOW (ref 3.5–5.2)
Alkaline Phosphatase: 141 U/L — ABNORMAL HIGH (ref 39–117)
CO2: 27 mEq/L (ref 19–32)
Calcium: 8.9 mg/dL (ref 8.4–10.5)
Chloride: 103 mEq/L (ref 96–112)
Chloride: 103 mEq/L (ref 96–112)
Creatinine, Ser: 0.77 mg/dL (ref 0.50–1.35)
GFR calc Af Amer: >90 mL/min (ref >90)
GFR calc non Af Amer: >90 mL/min (ref >90)
Glucose, Bld: 127 mg/dL — ABNORMAL HIGH (ref 70–99)
Potassium: 4 mEq/L (ref 3.5–5.1)
Sodium: 140 mEq/L (ref 135–145)
Sodium: 140 mEq/L (ref 135–145)
Total Bilirubin: 0.2 mg/dL — ABNORMAL LOW (ref 0.3–1.2)
Total Bilirubin: 0.5 mg/dL (ref 0.3–1.2)
Total Protein: 7.1 g/dL (ref 6.0–8.3)

## 2013-08-23 LAB — CBC WITH DIFFERENTIAL/PLATELET
Basophils Absolute: 0 10*3/uL (ref 0.0–0.1)
Basophils Relative: 0 % (ref 0–1)
Eosinophils Absolute: 0.1 10*3/uL (ref 0.0–0.7)
Hemoglobin: 12.6 g/dL — ABNORMAL LOW (ref 13.0–17.0)
MCHC: 33.6 g/dL (ref 30.0–36.0)
Monocytes Relative: 6 % (ref 3–12)
Neutro Abs: 3.1 10*3/uL (ref 1.7–7.7)
Neutrophils Relative %: 61 % (ref 43–77)
Platelets: 331 10*3/uL (ref 150–400)

## 2013-08-23 MED ORDER — HYDROMORPHONE HCL PF 1 MG/ML IJ SOLN
1.0000 mg | INTRAMUSCULAR | Status: DC | PRN
  Administered 2013-08-23 – 2013-08-24 (×6): 1 mg via INTRAVENOUS
  Filled 2013-08-23 (×6): qty 1

## 2013-08-23 MED ORDER — ACETAMINOPHEN 650 MG RE SUPP: 650.0000 mg | Freq: Four times a day (QID) | RECTAL | Status: DC | PRN

## 2013-08-23 MED ORDER — CLONIDINE HCL 0.2 MG PO TABS
0.3000 mg | ORAL_TABLET | Freq: Two times a day (BID) | ORAL | Status: DC
  Administered 2013-08-23 – 2013-08-25 (×4): 0.3 mg via ORAL
  Filled 2013-08-23 (×4): qty 1

## 2013-08-23 MED ORDER — SODIUM CHLORIDE 0.9 % IV SOLN
INTRAVENOUS | Status: DC
  Administered 2013-08-23 – 2013-08-24 (×3): via INTRAVENOUS

## 2013-08-23 MED ORDER — CLONIDINE HCL 0.2 MG/24HR TD PTWK
0.2000 mg | MEDICATED_PATCH | TRANSDERMAL | Status: DC
  Administered 2013-08-23: 0.2 mg via TRANSDERMAL
  Filled 2013-08-23: qty 1

## 2013-08-23 MED ORDER — PANTOPRAZOLE SODIUM 40 MG IV SOLR
40.0000 mg | Freq: Two times a day (BID) | INTRAVENOUS | Status: DC
  Administered 2013-08-23 – 2013-08-24 (×3): 40 mg via INTRAVENOUS
  Filled 2013-08-23 (×3): qty 40

## 2013-08-23 MED ORDER — LORAZEPAM 2 MG/ML IJ SOLN: 1.0000 mg | Freq: Four times a day (QID) | INTRAMUSCULAR | Status: AC | PRN

## 2013-08-23 MED ORDER — SODIUM CHLORIDE 0.9 % IV BOLUS (SEPSIS)
1000.0000 mL | Freq: Once | INTRAVENOUS | Status: AC
  Administered 2013-08-23: 1000 mL via INTRAVENOUS

## 2013-08-23 MED ORDER — LEVETIRACETAM 500 MG PO TABS
750.0000 mg | ORAL_TABLET | Freq: Every day | ORAL | Status: DC
  Administered 2013-08-23 – 2013-08-26 (×4): 750 mg via ORAL
  Filled 2013-08-23 (×4): qty 2

## 2013-08-23 MED ORDER — VITAMIN B-1 100 MG PO TABS
100.0000 mg | ORAL_TABLET | Freq: Every day | ORAL | Status: DC
  Administered 2013-08-23 – 2013-08-26 (×4): 100 mg via ORAL
  Filled 2013-08-23 (×4): qty 1

## 2013-08-23 MED ORDER — ONDANSETRON HCL 4 MG/2ML IJ SOLN
4.0000 mg | Freq: Once | INTRAMUSCULAR | Status: AC
  Administered 2013-08-23: 4 mg via INTRAVENOUS
  Filled 2013-08-23: qty 2

## 2013-08-23 MED ORDER — HYDRALAZINE HCL 20 MG/ML IJ SOLN
2.0000 mg | INTRAMUSCULAR | Status: DC | PRN
  Administered 2013-08-23: 2 mg via INTRAVENOUS
  Filled 2013-08-23: qty 1

## 2013-08-23 MED ORDER — POTASSIUM CHLORIDE CRYS ER 20 MEQ PO TBCR
40.0000 meq | EXTENDED_RELEASE_TABLET | Freq: Once | ORAL | Status: AC
  Administered 2013-08-23: 40 meq via ORAL
  Filled 2013-08-23: qty 2

## 2013-08-23 MED ORDER — PROMETHAZINE HCL 25 MG PO TABS: 25.0000 mg | ORAL_TABLET | Freq: Four times a day (QID) | ORAL | Status: DC | PRN

## 2013-08-23 MED ORDER — ONDANSETRON HCL 4 MG/2ML IJ SOLN: 4.0000 mg | Freq: Four times a day (QID) | INTRAMUSCULAR | Status: DC | PRN

## 2013-08-23 MED ORDER — ADULT MULTIVITAMIN W/MINERALS CH
1.0000 | ORAL_TABLET | Freq: Every day | ORAL | Status: DC
  Administered 2013-08-23 – 2013-08-26 (×4): 1 via ORAL
  Filled 2013-08-23 (×4): qty 1

## 2013-08-23 MED ORDER — HYDROMORPHONE HCL PF 1 MG/ML IJ SOLN
1.0000 mg | Freq: Once | INTRAMUSCULAR | Status: AC
  Administered 2013-08-23: 1 mg via INTRAVENOUS
  Filled 2013-08-23: qty 1

## 2013-08-23 MED ORDER — SENNA 8.6 MG PO TABS
1.0000 | ORAL_TABLET | Freq: Two times a day (BID) | ORAL | Status: DC
  Administered 2013-08-23 – 2013-08-26 (×7): 8.6 mg via ORAL
  Filled 2013-08-23 (×7): qty 1

## 2013-08-23 MED ORDER — ONDANSETRON HCL 4 MG PO TABS
4.0000 mg | ORAL_TABLET | Freq: Four times a day (QID) | ORAL | Status: DC | PRN
  Filled 2013-08-23: qty 1

## 2013-08-23 MED ORDER — THIAMINE HCL 100 MG/ML IJ SOLN: 100.0000 mg | Freq: Every day | INTRAMUSCULAR | Status: DC

## 2013-08-23 MED ORDER — HYDRALAZINE HCL 20 MG/ML IJ SOLN
10.0000 mg | Freq: Four times a day (QID) | INTRAMUSCULAR | Status: DC | PRN
  Administered 2013-08-23 – 2013-08-25 (×2): 10 mg via INTRAVENOUS
  Filled 2013-08-23 (×2): qty 1

## 2013-08-23 MED ORDER — FLEET ENEMA 7-19 GM/118ML RE ENEM: 1.0000 | ENEMA | Freq: Once | RECTAL | Status: AC | PRN

## 2013-08-23 MED ORDER — ENOXAPARIN SODIUM 40 MG/0.4ML ~~LOC~~ SOLN
40.0000 mg | SUBCUTANEOUS | Status: DC
  Administered 2013-08-23 – 2013-08-26 (×4): 40 mg via SUBCUTANEOUS
  Filled 2013-08-23 (×4): qty 0.4

## 2013-08-23 MED ORDER — BISACODYL 10 MG RE SUPP: 10.0000 mg | Freq: Every day | RECTAL | Status: DC | PRN

## 2013-08-23 MED ORDER — HYDROMORPHONE HCL PF 1 MG/ML IJ SOLN: 1.0000 mg | INTRAMUSCULAR | Status: DC | PRN

## 2013-08-23 MED ORDER — TRAZODONE HCL 50 MG PO TABS: 25.0000 mg | ORAL_TABLET | Freq: Every evening | ORAL | Status: DC | PRN

## 2013-08-23 MED ORDER — LORAZEPAM 1 MG PO TABS
1.0000 mg | ORAL_TABLET | Freq: Four times a day (QID) | ORAL | Status: AC | PRN
  Administered 2013-08-25: 1 mg via ORAL
  Filled 2013-08-23 (×2): qty 1

## 2013-08-23 MED ORDER — FOLIC ACID 1 MG PO TABS
1.0000 mg | ORAL_TABLET | Freq: Every day | ORAL | Status: DC
  Administered 2013-08-23 – 2013-08-26 (×4): 1 mg via ORAL
  Filled 2013-08-23 (×4): qty 1

## 2013-08-23 MED ORDER — ACETAMINOPHEN 325 MG PO TABS: 650.0000 mg | ORAL_TABLET | Freq: Four times a day (QID) | ORAL | Status: DC | PRN

## 2013-08-23 MED ORDER — LISINOPRIL 10 MG PO TABS
10.0000 mg | ORAL_TABLET | Freq: Every day | ORAL | Status: DC
  Administered 2013-08-24 – 2013-08-25 (×2): 10 mg via ORAL
  Filled 2013-08-23 (×2): qty 1

## 2013-08-23 NOTE — H&P (Signed)
[facility] History and Physical  Paul [name] QIO:[id number] DOB: November 01, 1966 DOA: 08/23/2013  Referring physician:  PCP: No PCP Per Patient  Specialists:   Chief Complaint: abdominal pain  HPI: Paul [name] is a 47 y.o. male with a past medical history significant for polysubstance abuse, pancreatitis, noncompliance, hypertension. He was discharged from [facility] on 06/15/2013 after being treated for traumatic injury cerebral hemorrhage and open skull fracture. During that hospitalization he also suffered convulsions and seizures. Neurology was uncertain about whether they were from substance abuse or head trauma or both. In addition he was discharged after hospitalization for acute pancreatitis and uncontrolled HTN. He presents to [facility] today with cc abdominal pain. He reports pain began 3 days ago. He describes pain as constant, sharpe located epigastric area that radiates to chest and back. Associated symptoms include nausea with one episode vomiting that occurred yesterday. He denies coffee ground emesis. He reports drinking ETOH daily and most recently drank 1 pint of wine this am. He also reports using crack and last episode was yesterday. He complains of constipation. He denies fever, chills, palpitation, headache syncope or near syncope. Lab work in ED significant for lipase 274, AST 46, alk phos 141. Vital signs significant for  BP 196/124. Of note, patient underwent hepato biliary scan 08/05/13 that was unremarkable. In addition he had CT abdomen at that time that yielded findings consistent with the patient's known recent pancreatitis. There is a 3 x 2.7 cm heterogeneous density in the pancreatic head could be due to pancreatic pseudocyst or pancreatic necrosis. There is a 1.8 cm low density between the pancreatic head and second portion of duodenum which is probably a developing  pseudocyst. There is inflammation of the adjacent duodenum with  diffuse duodenal bowel wall  thickening and surrounding  Inflammation. We are asked to admit for pain management and BP controll    Review of Systems: The patient denies  fever, weight loss,, vision loss, decreased hearing, hoarseness, chest pain, syncope, dyspnea on exertion, peripheral edema, balance deficits, hemoptysis,  melena, hematochezia, severe indigestion/heartburn, hematuria, incontinence, genital sores, muscle weakness, suspicious skin lesions, transient blindness, difficulty walking, depression, unusual weight change, abnormal bleeding, enlarged lymph nodes, angioedema, and breast masses.    Past Medical History  Diagnosis Date  . Pancreatitis, acute     First episode earlier in 2012, hospitalized again in 02/2012 and 09/2012  . Hypertension     noncompliance  . Hyperlipidemia     noncompliance  . Lung collapse     h/o  . ETOH abuse   . Cocaine abuse   . DTs (delirium tremens)     history of  . Nephrolithiasis   . Tobacco abuse   . Seizure disorder 07/17/2013  . Noncompliance 07/17/2013  . Traumatic subdural hematoma 06/15/2013  . 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 Open skull fracture 06/15/2013  . Polysubstance abuse     etoh, cocaine   Past Surgical History  Procedure Laterality Date  . Mandible fracture surgery  2006  . Left axillary      surgery for deep laceration   Social History:  reports that he has been smoking Cigarettes.  He has been smoking about 1.00 pack per day. He does not have any smokeless tobacco history on file. He reports that  drinks alcohol. He reports that he uses illicit drugs (Cocaine) about 3 times per week.  No Known Allergies  Family History  Problem Relation Age of Onset  . Diabetes type II Mother   .  Liver disease Neg Hx   . Colon cancer Neg Hx   . GI problems Neg Hx      Prior to Admission medications   Medication Sig Start Date End Date Taking? Authorizing Provider  cloNIDine (CATAPRES) 0.2 MG tablet Take 0.2 mg by mouth daily. Prescribed  one tablet twice daily but patient only takes once daily.    Historical Provider, MD  levETIRAcetam (KEPPRA) 750 MG tablet Take 750 mg by mouth daily. Prescribed one tablet twice daily but the patient states that she takes one tablet daily    Historical Provider, MD  omeprazole (PRILOSEC) 20 MG capsule Take 1 capsule (20 mg total) by mouth daily. For treatment of inflammation of your stomach lining. 07/20/13   Elliot Cousin, MD  ondansetron (ZOFRAN) 4 MG tablet Take 1 tablet (4 mg total) by mouth every 8 (eight) hours as needed for nausea. 08/01/13   Laray Anger, DO  promethazine (PHENERGAN) 25 MG tablet Take 1 tablet (25 mg total) by mouth every 6 (six) hours as needed for nausea. 08/23/13   Sunnie Nielsen, MD   Physical Exam: Filed Vitals:   08/23/13 1318  BP: 191/124  Pulse:   Temp:   Resp:      General:  Well developed NAD  Eyes: PERRL EOMI no scleral icterus  ENT: ears clear nose without drainage oropharynx without erythema or exudate. Mucus membrane mouth slightly dry but pink  Neck: supple no lymphadenopathy  Cardiovascular: RRR No MGR No LE edema  Respiratory: normal effort BS clear bilaterally to auscultation no wheeze no rhonchi  Abdomen: flat soft mild tenderness to palpation in epigastric area as well as right quadrants. +BS non-distende  Skin: warm and dry no rash  Musculoskeletal: no clubbing no cyanosis MOA  Psychiatric: alert, reluctantly cooperative with exam. Slightly irritable  Neurologic: cranial nerve II-XII intact  Labs on Admission:  Basic Metabolic Panel:  Recent Labs Lab 08/22/13 2315 08/23/13 1149  NA 140 140  K 3.2* 4.0  CL 103 103  CO2 27 29  GLUCOSE 127* 111*  BUN 8 9  CREATININE 0.77 0.87  CALCIUM 8.9 9.2   Liver Function Tests:  Recent Labs Lab 08/22/13 2315 08/23/13 1149  AST 15 46*  ALT 7 12  ALKPHOS 121* 141*  BILITOT 0.2* 0.5  PROT 7.1 7.1  ALBUMIN 2.8* 2.9*    Recent Labs Lab 08/22/13 2315 08/23/13 1149   LIPASE 199* 274*   No results found for this basename: AMMONIA,  in the last 168 hours CBC:  Recent Labs Lab 08/22/13 2315 08/23/13 1149  WBC 4.8 5.1  NEUTROABS  --  3.1  HGB 12.3* 12.6*  HCT 35.9* 37.5*  MCV 95.0 96.6  PLT 307 331   Cardiac Enzymes: No results found for this basename: CKTOTAL, CKMB, CKMBINDEX, TROPONINI,  in the last 168 hours  BNP (last 3 results) No results found for this basename: PROBNP,  in the last 8760 hours CBG: No results found for this basename: GLUCAP,  in the last 168 hours  Radiological Exams on Admission: No results found.  EKg  Assessment/Plan Active Problems:   Acute alcoholic pancreatitis:likely related to continued ETOH abuse. Will admit to medical floor. Will provide supportive therapy in form of IV hydration, anti-emetic and pain medicine. Will provide clear liquid as pt seems able to tolerate at time of my exam. Will give IV protonix. Will recheck lipase in am.   Abdominal pain/nausea: related to #1. Recent CT abdomen yielded findings consistent with the  patient's known recent pancreatitis. There is a 3 x 2.7 cm heterogeneous density in the pancreatic head could be due to pancreatic pseudocyst or pancreatic necrosis. There is a 1.8 cm low density between the pancreatic head and second portion of duodenum which is probably a developing pseudocyst. There is inflammation of the adjacent duodenum with diffuse duodenal bowel wall thickening and surrounding inflammation. Will monitor closely and if no improvement will consider further imaging and GI consult.    Hypertension: likely related to non-compliance in setting of polysubstance abuse. Patient admits to not taking medications. Will provide clonidine patch and prn hydralazine on admission. As #1 resolves will consider anti-hypertensive medication with less rebound when doses missed.     Alcohol abuse: no s/sx withdrawal on admission. Reports last drink this am. Patient does have hx of  withdrawal symptoms according to chart. Will use CIWA protocol and request SW consult    Tobacco abuse: nicotine patch. Counseled to stop smoking     History of seizures: etiology likely related to recent head trauma (6/14) in setting of polysubstance abuse and ETOH withdrawal. Will continue keppra. Monitor    Noncompliance: will request SW consult.      Code Status: full Family Communication: none available Disposition Plan: home when ready. Hopefully 24-48 hours  Time spent: 65 minutes  Gwenyth Bender [facility] Pager [phone]  If 7PM-7AM, please contact night-coverage [email or web address] Password [facility] 08/23/2013, 2:41 PM

## 2013-08-23 NOTE — ED Provider Notes (Signed)
CSN: [id number]     Arrival date & time 08/23/13  1045 History  This chart was scribed for Donnetta Hutching, MD by Leone Payor, ED Scribe. This patient was seen in room APA01/APA01 and the patient's care was started 11:46 AM.    Chief Complaint  Patient presents with  . Abdominal Pain   The history is provided by the patient. No language interpreter was used.    HPI Comments: Paul [name] is a 47 y.o. male with past medical history of pancreatitis, ETOH abuse who presents to the Emergency Department complaining of an ongoing episode of constant, worsened abdominal pain that started about 3 days ago. Pt has associated nausea and vomiting and reports the pain radiates to chest and back. Pt was seen last night for the same symptoms.   No PCP Past Medical History  Diagnosis Date  . Pancreatitis, acute     First episode earlier in 2012, hospitalized again in 02/2012 and 09/2012  . Hypertension     noncompliance  . Hyperlipidemia     noncompliance  . Lung collapse     h/o  . ETOH abuse   . Cocaine abuse   . DTs (delirium tremens)     history of  . Nephrolithiasis   . Tobacco abuse   . Seizure disorder 07/17/2013  . Noncompliance 07/17/2013  . Traumatic subdural hematoma 06/15/2013  . 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 Open skull fracture 06/15/2013  . Polysubstance abuse     etoh, cocaine   Past Surgical History  Procedure Laterality Date  . Mandible fracture surgery  2006  . Left axillary      surgery for deep laceration   Family History  Problem Relation Age of Onset  . Diabetes type II Mother   . Liver disease Neg Hx   . Colon cancer Neg Hx   . GI problems Neg Hx    History  Substance Use Topics  . Smoking status: Current Every Day Smoker -- 1.00 packs/day    Types: Cigarettes  . Smokeless tobacco: Not on file  . Alcohol Use: Yes     Comment: Three 40oz per day    Review of Systems A complete 10 system review of systems was obtained and all systems  are negative except as noted in the HPI and PMH.   Allergies  Review of patient's allergies indicates no known allergies.  Home Medications   Current Outpatient Rx  Name  Route  Sig  Dispense  Refill  . cloNIDine (CATAPRES) 0.2 MG tablet   Oral   Take 0.2 mg by mouth daily. Prescribed one tablet twice daily but patient only takes once daily.         Marland Kitchen levETIRAcetam (KEPPRA) 750 MG tablet   Oral   Take 750 mg by mouth daily. Prescribed one tablet twice daily but the patient states that she takes one tablet daily         . omeprazole (PRILOSEC) 20 MG capsule   Oral   Take 1 capsule (20 mg total) by mouth daily. For treatment of inflammation of your stomach lining.   30 capsule   3   . ondansetron (ZOFRAN) 4 MG tablet   Oral   Take 1 tablet (4 mg total) by mouth every 8 (eight) hours as needed for nausea.   6 tablet   0   . promethazine (PHENERGAN) 25 MG tablet   Oral   Take 1 tablet (25 mg total) by mouth every 6 (six)  hours as needed for nausea.   30 tablet   0    BP 177/133  Pulse 86  Temp(Src) 98.2 F (36.8 C) (Oral)  Resp 18  Ht 6' (1.829 m)  Wt 167 lb (75.751 kg)  BMI 22.64 kg/m2  SpO2 100% Physical Exam  Nursing note and vitals reviewed. Constitutional: He is oriented to person, place, and time. He appears well-developed and well-nourished.  HENT:  Head: Normocephalic and atraumatic.  Eyes: Conjunctivae and EOM are normal. Pupils are equal, round, and reactive to light.  Neck: Normal range of motion. Neck supple.  Cardiovascular: Normal rate, regular rhythm and normal heart sounds.   Pulmonary/Chest: Effort normal and breath sounds normal.  Abdominal: Soft. Bowel sounds are normal. There is tenderness.  Minimal epigastric tenderness.   Musculoskeletal: Normal range of motion.  Neurological: He is alert and oriented to person, place, and time.  Skin: Skin is warm and dry.  Psychiatric: He has a normal mood and affect.    ED Course  DIAGNOSTIC  STUDIES: Oxygen Saturation is 99% on RA, normal by my interpretation.    COORDINATION OF CARE: 11:52 AM Discussed treatment plan with pt at bedside and pt agreed to plan.   Procedures (including critical care time)   Results for orders placed during the hospital encounter of 08/23/13  CBC WITH DIFFERENTIAL      Result Value Range   WBC 5.1  4.0 - 10.5 K/uL   RBC 3.88 (*) 4.22 - 5.81 MIL/uL   Hemoglobin 12.6 (*) 13.0 - 17.0 g/dL   HCT 13.2 (*) 44.0 - 10.2 %   MCV 96.6  78.0 - 100.0 fL   MCH 32.5  26.0 - 34.0 pg   MCHC 33.6  30.0 - 36.0 g/dL   RDW 72.5  36.6 - 44.0 %   Platelets 331  150 - 400 K/uL   Neutrophils Relative % 61  43 - 77 %   Neutro Abs 3.1  1.7 - 7.7 K/uL   Lymphocytes Relative 32  12 - 46 %   Lymphs Abs 1.6  0.7 - 4.0 K/uL   Monocytes Relative 6  3 - 12 %   Monocytes Absolute 0.3  0.1 - 1.0 K/uL   Eosinophils Relative 1  0 - 5 %   Eosinophils Absolute 0.1  0.0 - 0.7 K/uL   Basophils Relative 0  0 - 1 %   Basophils Absolute 0.0  0.0 - 0.1 K/uL  COMPREHENSIVE METABOLIC PANEL      Result Value Range   Sodium 140  135 - 145 mEq/L   Potassium 4.0  3.5 - 5.1 mEq/L   Chloride 103  96 - 112 mEq/L   CO2 29  19 - 32 mEq/L   Glucose, Bld 111 (*) 70 - 99 mg/dL   BUN 9  6 - 23 mg/dL   Creatinine, Ser 3.47  0.50 - 1.35 mg/dL   Calcium 9.2  8.4 - 42.5 mg/dL   Total Protein 7.1  6.0 - 8.3 g/dL   Albumin 2.9 (*) 3.5 - 5.2 g/dL   AST 46 (*) 0 - 37 U/L   ALT 12  0 - 53 U/L   Alkaline Phosphatase 141 (*) 39 - 117 U/L   Total Bilirubin 0.5  0.3 - 1.2 mg/dL   GFR calc non Af Amer >90  >90 mL/min   GFR calc Af Amer >90  >90 mL/min  LIPASE, BLOOD      Result Value Range   Lipase 274 (*)  11 - 59 U/L     No diagnosis found.  MDM  Lipase, alkaline phosphatase, AST elevated.    IV hydration, pain management, admit.   Patient was seen in the emergency department last night and discharged home.  I personally performed the services described in this documentation, which was  scribed in my presence. The recorded information has been reviewed and is accurate.   Donnetta Hutching, MD 08/23/13 1407

## 2013-08-23 NOTE — H&P (Signed)
Patient seen, independently examined and chart reviewed. I agree with exam, assessment and plan discussed with Toya Smothers, NP.  47 year old man with history of alcohol abuse and cocaine abuse with 2 recent admissions to the hospital for alcoholic pancreatitis who presented to the emergency department today with abdominal pain. Last drink alcohol yesterday. Also has been using cocaine. Despite repeated counseling he has expressed no interest to stop this behavior per chart review. In the emergency department history, exam and laboratory evaluation suggested recurrent alcohol-induced pancreatitis and he was noted be hypertensive and referred for admission.  On previous admission he underwent nuclear medicine hepatobiliary scan which was unremarkable.  I evaluated the patient emergency department where he felt better after treatment of his pain. His epigastrium was somewhat tender but abdominal exam overall benign. He appeared well. Cardiovascular regular rate and rhythm. Respiratory clear to auscultation bilaterally.  We will admit for recurrent alcoholic pancreatitis, IV hydration, antiemetics and pain control. Strict n.p.o. until no longer requiring IV pain medication. His CT of the abdomen and pelvis 20 days ago suggested the possibility of a pseudocyst or even necrosis. Clinically he appears overall well and I doubt a severe pathology. Depending on his clinical course consideration could be given to repeat imaging.  His accelerated hypertension is likely secondary to noncompliance with clonidine and treatment in general. For now will resume clonidine but given his noncompliance it would be beneficial to transition to an alternative agent, likely two agents. Avoid beta-blocker with history of cocaine use. Alcohol and drug counseling will again be provided. Monitor for withdrawal.  Brendia Sacks, MD [facility] [phone]

## 2013-08-23 NOTE — ED Notes (Signed)
abd pain, N/V, for 3 days, Seen here yesterday for same

## 2013-08-23 NOTE — ED Notes (Signed)
Pt walking around in room, cursing. Attemped to recheck pts BP and pt refused. Pt stated,"Why can't a fucking doctor get in here." Explained that there is only one EDP here at the present time and the ED if full. Pt continued to curse.

## 2013-08-23 NOTE — ED Notes (Signed)
Pt states he drank alcohol yesterday morning. States pain to abdomen and back, states it feels like a flare up of pancreatitis. Seen here for same last night. Pt states he did not take his blood pressure medication last night.

## 2013-08-24 DIAGNOSIS — Z9119 Patient's noncompliance with other medical treatment and regimen: Secondary | ICD-10-CM

## 2013-08-24 DIAGNOSIS — R748 Abnormal levels of other serum enzymes: Secondary | ICD-10-CM | POA: Diagnosis present

## 2013-08-24 LAB — COMPREHENSIVE METABOLIC PANEL
ALT: 13 U/L (ref 0–53)
AST: 22 U/L (ref 0–37)
Calcium: 8.6 mg/dL (ref 8.4–10.5)
GFR calc Af Amer: >90 mL/min (ref >90)
Sodium: 136 mEq/L (ref 135–145)
Total Protein: 6.2 g/dL (ref 6.0–8.3)

## 2013-08-24 LAB — CBC
HCT: 37 % — ABNORMAL LOW (ref 39.0–52.0)
Hemoglobin: 12.5 g/dL — ABNORMAL LOW (ref 13.0–17.0)
MCH: 32.6 pg (ref 26.0–34.0)
MCHC: 33.8 g/dL (ref 30.0–36.0)
RBC: 3.84 MIL/uL — ABNORMAL LOW (ref 4.22–5.81)

## 2013-08-24 MED ORDER — HYDROMORPHONE HCL PF 1 MG/ML IJ SOLN
0.5000 mg | INTRAMUSCULAR | Status: DC | PRN
  Administered 2013-08-24: 0.5 mg via INTRAVENOUS
  Filled 2013-08-24: qty 1

## 2013-08-24 MED ORDER — PANTOPRAZOLE SODIUM 40 MG PO TBEC
40.0000 mg | DELAYED_RELEASE_TABLET | Freq: Two times a day (BID) | ORAL | Status: DC
  Administered 2013-08-24 – 2013-08-26 (×5): 40 mg via ORAL
  Filled 2013-08-24 (×5): qty 1

## 2013-08-24 MED ORDER — OXYCODONE HCL 5 MG PO TABS
5.0000 mg | ORAL_TABLET | ORAL | Status: DC | PRN
  Administered 2013-08-24 – 2013-08-26 (×15): 5 mg via ORAL
  Filled 2013-08-24 (×15): qty 1

## 2013-08-24 NOTE — Care Management Note (Signed)
    Page 1 of 1   08/24/2013     1:45:35 PM   CARE MANAGEMENT NOTE 08/24/2013  Patient:  Paul [name], Paul [name]   Account Number:  [id number]  Date Initiated:  08/24/2013  Documentation initiated by:  Rosemary Holms  Subjective/Objective Assessment:   Pt states that he lives with friends. Has no PCP but was given [name] list of MD and resources by [facility]. Wishes to speak to [name] financial counselor     Action/Plan:   Anticipated DC Date:  08/25/2013   Anticipated DC Plan:  HOME/SELF CARE  In-house referral  Financial Counselor      DC Planning Services  CM consult      Choice offered to / List presented to:             Status of service:  In process, will continue to follow Medicare Important Message given?   (If response is "NO", the following Medicare IM given date fields will be blank) Date Medicare IM given:   Date Additional Medicare IM given:    Discharge Disposition:    Per UR Regulation:    If discussed at Long Length of Stay Meetings, dates discussed:    Comments:  08/24/13 Rosemary Holms RN BSN CM

## 2013-08-24 NOTE — Progress Notes (Signed)
[facility] PROGRESS NOTE  Paul [name] ZOX:[id number] DOB: Dec 02, 1966 DOA: 08/23/2013 PCP: No PCP Per Patient  Assessment/Plan: Acute alcoholic pancreatitis:likely related to continued ETOH abuse. Improved this am but still complains of pain but wants food and to continue IV pain medicine. Lipase trending downward. Will advance diet to full liquids. Continue supportive therapy in form of IV hydration, anti-emetic and pain medicine. Will give IV po.  If tolerating full liquid and pain managed with po meds may discharge this afternoon.  Abdominal pain/nausea: related to #1. Recent CT abdomen yielded findings consistent with the patient's known recent pancreatitis. There is a 3 x 2.7 cm heterogeneous density in the pancreatic head could be due to pancreatic pseudocyst or pancreatic necrosis. There is a 1.8 cm low density between the pancreatic head and second portion of duodenum which is probably a developing pseudocyst. There is inflammation of the adjacent duodenum with diffuse duodenal bowel wall thickening and surrounding inflammation. No vomiting and requesting food. Will advance diet to full liquid and monitor.    Hypertension: likely related to non-compliance in setting of polysubstance abuse. Patient admits to not taking medications. Fair control. Continue clonidine 0,3mg  bid and lisinopril. Will consider HCTZ as well for optimal control.    Alcohol abuse: no s/sx withdrawal on admission. No s/sx withdrawal.  Patient does have hx of withdrawal symptoms according to chart. Continue CIWA protocol.  Elevated alk phos: likely related to ETOH. Recent nuclear medicin hepatobiliary scan was unremarkable. Will need OP monitoring. AST and ALT within the limits of normal. Total bili within the limits of normal.   Tobacco abuse: nicotine patch. Counseled to stop smoking   History of seizures: etiology likely related to recent head trauma (6/14) in setting of polysubstance abuse and ETOH  withdrawal. No seizure activity.  Will continue keppra.   Noncompliance: will request SW consult.    Code Status: full Family Communication: none present Disposition Plan: home hopefully this afternoon   Consultants:  none  Procedures:  none  Antibiotics:  none  HPI/Subjective: Sitting up in bed watching TV and using smart phone. Reports hunger and requests food, but not at expense of IV pain medicine as he still reports pain with some nausea. No vomiting.   Objective: Filed Vitals:   08/24/13 0930  BP: 166/113  Pulse:   Temp:   Resp:     Intake/Output Summary (Last 24 hours) at 08/24/13 1130 Last data filed at 08/24/13 0913  Gross per 24 hour  Intake    240 ml  Output   1275 ml  Net  -1035 ml   Filed Weights   08/23/13 1101 08/23/13 1454  Weight: 75.751 kg (167 lb) 75.751 kg (167 lb)    Exam:   General:  Well nourished NAD  Cardiovascular: RRR No MGR No LE edema PPP  Respiratory: normal effort BS clear to auscultation bilaterally no wheeze no crackles  Abdomen: non-distended, +BS x4, mild tenderness right quadrants to palpation, no guarding.   Musculoskeletal: MAE no clubbing no cyanosis  Data Reviewed: Basic Metabolic Panel:  Recent Labs Lab 08/22/13 2315 08/23/13 1149 08/24/13 0541  NA 140 140 136  K 3.2* 4.0 3.8  CL 103 103 102  CO2 27 29 26   GLUCOSE 127* 111* 110*  BUN 8 9 4*  CREATININE 0.77 0.87 0.72  CALCIUM 8.9 9.2 8.6   Liver Function Tests:  Recent Labs Lab 08/22/13 2315 08/23/13 1149 08/24/13 0541  AST 15 46* 22  ALT 7 12 13   ALKPHOS  121* 141* 184*  BILITOT 0.2* 0.5 0.7  PROT 7.1 7.1 6.2  ALBUMIN 2.8* 2.9* 2.4*    Recent Labs Lab 08/22/13 2315 08/23/13 1149 08/24/13 0541  LIPASE 199* 274* 150*   No results found for this basename: AMMONIA,  in the last 168 hours CBC:  Recent Labs Lab 08/22/13 2315 08/23/13 1149 08/24/13 0541  WBC 4.8 5.1 3.9*  NEUTROABS  --  3.1  --   HGB 12.3* 12.6* 12.5*  HCT  35.9* 37.5* 37.0*  MCV 95.0 96.6 96.4  PLT 307 331 259   Cardiac Enzymes: No results found for this basename: CKTOTAL, CKMB, CKMBINDEX, TROPONINI,  in the last 168 hours BNP (last 3 results) No results found for this basename: PROBNP,  in the last 8760 hours CBG: No results found for this basename: GLUCAP,  in the last 168 hours  No results found for this or any previous visit (from the past 240 hour(s)).   Studies: No results found.  Scheduled Meds: . cloNIDine  0.3 mg Oral BID  . enoxaparin (LOVENOX) injection  40 mg Subcutaneous Q24H  . folic acid  1 mg Oral Daily  . levETIRAcetam  750 mg Oral Daily  . lisinopril  10 mg Oral Daily  . multivitamin with minerals  1 tablet Oral Daily  . pantoprazole (PROTONIX) IV  40 mg Intravenous Q12H  . senna  1 tablet Oral BID  . thiamine  100 mg Oral Daily   Or  . thiamine  100 mg Intravenous Daily   Continuous Infusions: . sodium chloride 75 mL/hr at 08/24/13 0102    Active Problems:   Acute alcoholic pancreatitis   Alcohol abuse   Tobacco abuse   Hypertension   History of seizures   Noncompliance   Elevated alkaline phosphatase level    Time spent: 30 minutes    Gwenyth Bender  [facility] Pager [phone] If 7PM-7AM, please contact night-coverage at [email or web address], password [facility] 08/24/2013, 11:30 AM  LOS: 1 day

## 2013-08-24 NOTE — Progress Notes (Signed)
Patient seen, independently examined and chart reviewed. I agree with exam, assessment and plan discussed with Toya Smothers, NP.  He is hungry but complains of continue epigastric pain without change.  He appears calm and comfortable. Epigastrium is moderately tender. Otherwise abdomen is benign. He remains afebrile. Blood pressure is labile. Lipase has improved. CBC is unremarkable.  Overall he appears clinically improved. Advance diet as tolerated, wean off pain medications. Once able to tolerate a diet with minimal to no pain would anticipate discharge home, likely in the next 24 hours. Discussed with case management. Patient will be encouraged to establish with primary care physician of choice. He should also be referred to GI as an outpatient for consideration of further evaluation and possibly repeat scan in the next 2-4 weeks.  Compliance and substance abuse will likely continue to be issues.  Brendia Sacks, MD [facility] [phone]

## 2013-08-24 NOTE — Progress Notes (Signed)
Clinical Social Work Department BRIEF PSYCHOSOCIAL ASSESSMENT 08/24/2013  Patient:  Paul [name], Paul [name]     Account Number:  [id number]     Admit date:  08/23/2013  Clinical Social Worker:  Robin Searing  Date/Time:  08/24/2013 11:03 AM  Referred by:  Physician  Date Referred:  08/24/2013 Referred for  Substance Abuse   Other Referral:   Interview type:  Patient Other interview type:    PSYCHOSOCIAL DATA Living Status:  FRIEND(S) Admitted from facility:   Level of care:   Primary support name:  friend Primary support relationship to patient:  FRIEND Degree of support available:   minimal    CURRENT CONCERNS Current Concerns  Substance Abuse   Other Concerns:    SOCIAL WORK ASSESSMENT / PLAN Met with patient to discuss his etoh use. Patient reports that he drinks "as much as I can get". He states he drinks daily and most recently the day of admission he drink a pint of wine. Spoke with patient about his pancreatitis and the negative impact drinking has on this- he concurs and acknowledges that drinking makes his pancreatitis worse-  Patient also reports that he has been to rehab in the past- his answers are short and show a disinterest in talking about his etoh abuse- Patient offered resources for etoh detox/rehab- he declines saying he has the information already.   Assessment/plan status:  Other - See comment Other assessment/ plan:   Will f/u with patient again tomorrow to further discuss and encourage him to abstain and link him with resoources.   Information/referral to community resources:   Patient declines at this time.    PATIENT'S/FAMILY'S RESPONSE TO PLAN OF CARE: Patient was not very engaged or interested in CSW visit to discuss his etoh abuse- encouraged him to consider detox/rehab and will f/u tomorrow to discuss further with him.       Reece Levy, MSW [phone]

## 2013-08-24 NOTE — Progress Notes (Signed)
Utilization Review Complete  

## 2013-08-25 DIAGNOSIS — I10 Essential (primary) hypertension: Secondary | ICD-10-CM

## 2013-08-25 MED ORDER — LISINOPRIL 10 MG PO TABS
20.0000 mg | ORAL_TABLET | Freq: Every day | ORAL | Status: DC
  Administered 2013-08-26: 20 mg via ORAL
  Filled 2013-08-25: qty 2

## 2013-08-25 MED ORDER — CLONIDINE HCL 0.3 MG PO TABS: 0.3000 mg | ORAL_TABLET | Freq: Two times a day (BID) | ORAL | Status: DC

## 2013-08-25 MED ORDER — OXYCODONE HCL 5 MG PO TABS: 5.0000 mg | ORAL_TABLET | ORAL | Status: DC | PRN

## 2013-08-25 MED ORDER — PANTOPRAZOLE SODIUM 40 MG PO TBEC: 40.0000 mg | DELAYED_RELEASE_TABLET | Freq: Two times a day (BID) | ORAL | Status: DC

## 2013-08-25 MED ORDER — LISINOPRIL 10 MG PO TABS: 10.0000 mg | ORAL_TABLET | Freq: Every day | ORAL | Status: DC

## 2013-08-25 MED ORDER — HYDRALAZINE HCL 25 MG PO TABS
25.0000 mg | ORAL_TABLET | Freq: Three times a day (TID) | ORAL | Status: DC
  Administered 2013-08-25 – 2013-08-26 (×4): 25 mg via ORAL
  Filled 2013-08-25 (×4): qty 1

## 2013-08-25 MED ORDER — CLONIDINE HCL 0.1 MG PO TABS
0.1000 mg | ORAL_TABLET | Freq: Two times a day (BID) | ORAL | Status: DC
  Administered 2013-08-25 – 2013-08-26 (×2): 0.1 mg via ORAL
  Filled 2013-08-25 (×2): qty 1

## 2013-08-25 NOTE — Progress Notes (Signed)
Pt complaining of pain medication not relieving pain like dilaudid. MD called and made aware of pt complaint and stated to keep current orders. No new orders. Pt stable and safe. Will continue to monitor.

## 2013-08-25 NOTE — Progress Notes (Signed)
Anticipated discharge today however his blood pressure medication needs to be adjusted for optimal control in setting of frequent non-compliance.   Have decreased clonidine, increased lisinopril and added hydralzine.   Will monitor BP closely and if control adequate will likely discharge in am.       Lesle Chris. Caston Coopersmith, NP

## 2013-08-25 NOTE — Discharge Summary (Signed)
Patient seen and examined.  Above note reviewed.  Patient reports worsening abdominal pain after eating solid food.  Will likely need to scale back on diet to clear liquids for now. Continue oral pain meds.   He is on clonidine for blood pressure control in the outpatient setting.  In the setting of his noncompliance, this medication would not be optimal.  We have weaned down clonidine and started him on hydralazine and increased lisinopril.  He was counseled on the importance of alcohol cessation and how it relates to his recurrent admissions for acute pancreatitis.  He voices that he is not ready to quit drinking.  MEMON,JEHANZEB

## 2013-08-25 NOTE — Progress Notes (Signed)
Late entry for 08/23/13-- ED/CM noted patient did not have health insurance and/or PCP listed in the computer.  Patient was given the [facility] with information on the clinics, food pantries, and the handout for new health insurance sign-up.  Patient expressed appreciation for this.

## 2013-08-25 NOTE — Discharge Summary (Signed)
Physician Discharge Summary  [name] AVW:[id number] DOB: [birth year] DOA: 08/23/2013  PCP: No PCP Per Patient  Admit date: 08/23/2013 Discharge date: 08/25/2013  Time spent: 40 minutes  Recommendations for Outpatient Follow-up:  1. Pt provided with information regarding [facility] department. Instructed to follow up in 1 week for evaluation of symptoms and monitoring of BP  Discharge Diagnoses:  Active Problems:   Acute alcoholic pancreatitis   Alcohol abuse   Tobacco abuse   Hypertension   History of seizures   Noncompliance   Elevated alkaline phosphatase level   Discharge Condition: stable  Diet recommendation: regular  Filed Weights   08/23/13 1101 08/23/13 1454  Weight: 75.751 kg (167 lb) 75.751 kg (167 lb)    History of present illness:  Paul [name] is a 47 y.o. male with a past medical history significant for polysubstance abuse, pancreatitis, noncompliance, hypertension. He was discharged from [facility] on 06/15/2013 after being treated for traumatic injury cerebral hemorrhage and open skull fracture. During that hospitalization he also suffered convulsions and seizures. Neurology was uncertain about whether they were from substance abuse or head trauma or both. In addition he was discharged after hospitalization for acute pancreatitis and uncontrolled HTN. He presented to [facility] 08/23/13 with cc abdominal pain. He reported pain began 3 days prior. He described pain as constant, sharpe located epigastric area that radiates to chest and back. Associated symptoms included nausea with one episode vomiting that occurred the day before. He denied coffee ground emesis. He reported drinking ETOH daily and most recently drank 1 pint of wine the am of admission. He also reported using crack and last episode was 1 day prior to admission. He complained of constipation. He denied fever, chills, palpitation, headache syncope or near syncope. Lab work in ED significant  for lipase 274, AST 46, alk phos 141. Vital signs significant for BP 196/124. Of note, patient underwent hepato biliary scan 08/05/13 that was unremarkable. In addition he had CT abdomen at that time that yielded findings consistent with the patient's known recent pancreatitis. There is a 3 x 2.7 cm heterogeneous density in the pancreatic head could be due to pancreatic pseudocyst or pancreatic necrosis. There is a 1.8 cm low density between the pancreatic head and second portion of duodenum which is probably a developing pseudocyst. There is inflammation of the adjacent duodenum with diffuse duodenal bowel wall thickening and surrounding Inflammation.       Hospital Course:  Acute alcoholic pancreatitis:likely related to continued ETOH abuse.  Admitted for recurrent alcoholic pancreatitis. He was provided with IV hydration and antiemetics and IV pain medicine. He quickly improved requesting food but resisted transitioning pain med to po. His lipase trended downward. At discharge he was tolerating regular diet and taking po pain meds with complaints of "i don't like those pills".    Abdominal pain/nausea: related to #1. CT abdomen done 20 days ago yielded findings consistent with the patient's known recent pancreatitis. There is a 3 x 2.7 cm heterogeneous density in the pancreatic head could be due to pancreatic pseudocyst or pancreatic necrosis. There is a 1.8 cm low density between the pancreatic head and second portion of duodenum which is probably a developing pseudocyst. There is inflammation of the adjacent duodenum with diffuse duodenal bowel wall thickening and surrounding inflammation. Pt had no episodes of vomiting during hospitalization and requested food on admission. At discharge, abdominal exam benign with soft abdomen, improved BS and less tenderness. Recommend follow up with PCP to  trend symptoms and follow CT results as indicated. Of note, pt counseled that if he continued ETOH consumption, it  was likely his abdominal pain would persist. He replied "mam, my drinking does not have a thing to do with my stomach pain".   Hypertension: uncontrolled.  likely related to non-compliance in setting of polysubstance abuse. Patient admits to not taking medications. He was given clonidine and lisinopril.  Fair control at discharge.    Alcohol abuse: no s/sx withdrawal on admission. No s/sx withdrawal this hospitalization. Patient does have hx of withdrawal symptoms according to chart. Continue CIWA protocol.   Elevated alk phos: likely related to ETOH. Recent nuclear medicin hepatobiliary scan was unremarkable. Will need OP monitoring. AST and ALT within the limits of normal. Total bili within the limits of normal.   Tobacco abuse: nicotine patch. Counseled to stop smoking  History of seizures: etiology likely related to recent head trauma (6/14) in setting of polysubstance abuse and ETOH withdrawal. No seizure activity. Will continue keppra.  Noncompliance: will request SW consult.      Procedures:  none  Consultations:  none  Discharge Exam: Filed Vitals:   08/25/13 0642  BP: 149/95  Pulse: 65  Temp: 97.8 F (36.6 C)  Resp: 18    General: watching TV. NAD Cardiovascular: RRR No MGR No LE edema Respiratory: normal effort BS clear bilaterally no wheeze no rhonchi Abdomen: non-distended, soft only mildly tender epigastric area. +BS x4.   Discharge Instructions     Medication List         cloNIDine 0.3 MG tablet  Commonly known as:  CATAPRES  Take 1 tablet (0.3 mg total) by mouth 2 (two) times daily.     levETIRAcetam 750 MG tablet  Commonly known as:  KEPPRA  Take 750 mg by mouth daily. Prescribed one tablet twice daily but the patient states that she takes one tablet daily     lisinopril 10 MG tablet  Commonly known as:  PRINIVIL,ZESTRIL  Take 1 tablet (10 mg total) by mouth daily.     omeprazole 20 MG capsule  Commonly known as:  PRILOSEC  Take 1 capsule (20  mg total) by mouth daily. For treatment of inflammation of your stomach lining.     ondansetron 4 MG tablet  Commonly known as:  ZOFRAN  Take 1 tablet (4 mg total) by mouth every 8 (eight) hours as needed for nausea.     oxyCODONE 5 MG immediate release tablet  Commonly known as:  Oxy IR/ROXICODONE  Take 1 tablet (5 mg total) by mouth every 3 (three) hours as needed for pain.     pantoprazole 40 MG tablet  Commonly known as:  PROTONIX  Take 1 tablet (40 mg total) by mouth 2 (two) times daily before a meal.     promethazine 25 MG tablet  Commonly known as:  PHENERGAN  Take 1 tablet (25 mg total) by mouth every 6 (six) hours as needed for nausea.       No Known Allergies    The results of significant diagnostics from this hospitalization (including imaging, microbiology, ancillary and laboratory) are listed below for reference.    Significant Diagnostic Studies: Ct Abdomen Pelvis W Contrast  08/03/2013   *RADIOLOGY REPORT*  Clinical Data: Recent hospitalization for pancreatitis.  Umbilical abdomen pain.  CT ABDOMEN AND PELVIS WITH CONTRAST  Technique:  Multidetector CT imaging of the abdomen and pelvis was performed following the standard protocol during bolus administration of intravenous contrast.  Contrast:  50mL OMNIPAQUE IOHEXOL 300 MG/ML  SOLN, OMNIPAQUE IOHEXOL 300 MG/ML  SOLN  Comparison: CT abdomen pelvis November 05, 2012  Findings: There is 3 x 2.7 cm heterogeneous low density in the area of pancreatic head.  There is dilatation of pancreatic duct.  There is edema and thickening of duodenal wall.  There are multiple calcifications in the pancreas.  There is mild peri pancreatic inflammation particularly at the pancreatic head.  There is mild intrahepatic biliary ductal dilatation.  The liver is otherwise normal.  The gallbladder is normal.  The spleen is normal.  The bilateral adrenal glands are normal.  The previously described right kidney stone is not seen.  There is no  hydronephrosis bilaterally.  The aorta is normal.  There is no small bowel obstruction or diverticulitis.  The appendix is normal.  Partial fluid filled bladder is normal.  There is mild atelectasis of the posterior lung bases.  There is no pleural effusion.  There are degenerative joint changes of the L5-S1 level with vacuum disc phenomenon, narrowed joint space and grade 1 anterior listhesis of L5 on S1. 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  IMPRESSION: Findings consistent with the patient's known recent pancreatitis. There is a 3 x 2.7 cm heterogeneous density in the pancreatic head could be due to pancreatic pseudocyst or pancreatic necrosis. There is a 1.8 cm low density between the pancreatic head and second portion of duodenum which is probably a developing pseudocyst.  There is inflammation of the adjacent duodenum with diffuse duodenal bowel wall thickening and surrounding inflammation.   Original Report Authenticated By: Sherian Rein, M.D.   Nm Hepato W/eject Fract  08/04/2013   *RADIOLOGY REPORT*  Clinical Data:  Abdominal pain, history hypertension, pancreatitis, hyperlipidemia, ethanol, drug, and tobacco abuse  NUCLEAR MEDICINE HEPATOBILIARY IMAGING WITH GALLBLADDER EF:  Technique: Sequential images of the abdomen were obtained for 60 minutes following intravenous administration of radiopharmaceutical.  Patient then ingested 8 ounces of commercially available Ensure Plus and imaging was continued for 60 minutes.  A time-activity curve was generated from tracer within the gallbladder following Ensure Plus ingestion, and the gallbladder ejection fraction was calculated.  Radiopharmaceutical:  4.9 mCi Tc-43m mebrofenin  Comparison: None  Findings: Prompt tracer extraction from bloodstream, indicating normal hepatocellular function. Prompt excretion of tracer into biliary tree. Gallbladder visualized at 30 minutes. Small bowel visualized at 15 minutes. No hepatic retention of tracer.  Early emptying of  tracer from gallbladder at time of fatty meal ingestion. Gallbladder then demonstrates partial refilling and then empties towards the conclusion of the exam. Subjectively gallbladder emptying is normal. Calculated gallbladder ejection fraction is 37%, normal. Patient experienced no symptoms following fatty meal ingestion.  IMPRESSION: Normal exam as above.  Normal values for gallbladder ejection fraction: > 30% for exams utilizing sincalide (CCK) > 33% for exams utilizing fatty meal stimulation with Ensure Plus   Original Report Authenticated By: Ulyses Southward, M.D.    Microbiology: No results found for this or any previous visit (from the past 240 hour(s)).   Labs: Basic Metabolic Panel:  Recent Labs Lab 08/22/13 2315 08/23/13 1149 08/24/13 0541  NA 140 140 136  K 3.2* 4.0 3.8  CL 103 103 102  CO2 27 29 26   GLUCOSE 127* 111* 110*  BUN 8 9 4*  CREATININE 0.77 0.87 0.72  CALCIUM 8.9 9.2 8.6   Liver Function Tests:  Recent Labs Lab 08/22/13 2315 08/23/13 1149 08/24/13 0541  AST 15 46* 22  ALT 7 12  13  ALKPHOS 121* 141* 184*  BILITOT 0.2* 0.5 0.7  PROT 7.1 7.1 6.2  ALBUMIN 2.8* 2.9* 2.4*    Recent Labs Lab 08/22/13 2315 08/23/13 1149 08/24/13 0541  LIPASE 199* 274* 150*   No results found for this basename: AMMONIA,  in the last 168 hours CBC:  Recent Labs Lab 08/22/13 2315 08/23/13 1149 08/24/13 0541  WBC 4.8 5.1 3.9*  NEUTROABS  --  3.1  --   HGB 12.3* 12.6* 12.5*  HCT 35.9* 37.5* 37.0*  MCV 95.0 96.6 96.4  PLT 307 331 259   Cardiac Enzymes: No results found for this basename: CKTOTAL, CKMB, CKMBINDEX, TROPONINI,  in the last 168 hours BNP: BNP (last 3 results) No results found for this basename: PROBNP,  in the last 8760 hours CBG: No results found for this basename: GLUCAP,  in the last 168 hours     Signed:  Gwenyth Bender  [facility] 08/25/2013, 12:47 PM

## 2013-08-25 NOTE — Progress Notes (Signed)
Reviewed.  Please see note from earlier today.  Paul [name]

## 2013-08-26 LAB — LIPASE, BLOOD: Lipase: 44 U/L (ref 11–59)

## 2013-08-26 MED ORDER — HYDRALAZINE HCL 25 MG PO TABS: 25.0000 mg | ORAL_TABLET | Freq: Three times a day (TID) | ORAL | Status: DC

## 2013-08-26 MED ORDER — LISINOPRIL 20 MG PO TABS: 20.0000 mg | ORAL_TABLET | Freq: Every day | ORAL | Status: DC

## 2013-08-26 MED ORDER — OXYCODONE HCL 5 MG PO TABS: 5.0000 mg | ORAL_TABLET | ORAL | Status: DC | PRN

## 2013-08-26 NOTE — Discharge Summary (Signed)
Physician Discharge Summary  Paul [name] AVW:[id number] DOB: May 19, 1966 DOA: 08/23/2013  PCP: No PCP Per Patient  Admit date: 08/23/2013 Discharge date: 08/26/2013  Time spent: 30 minutes  Recommendations for Outpatient Follow-up:  Pt provided with information regarding [facility] department. Instructed to follow up in 1 week for evaluation of symptoms and monitoring of BP  He will need outpatient follow up with GI regarding possible pancreatic pseudocyst  Discharge Diagnoses:  Active Problems:   Acute alcoholic pancreatitis   Alcohol abuse   Tobacco abuse   Hypertension   History of seizures   Noncompliance   Elevated alkaline phosphatase level   Discharge Condition: medically stable  Diet recommendation: heart healthy  Filed Weights   08/23/13 1101 08/23/13 1454  Weight: 75.751 kg (167 lb) 75.751 kg (167 lb)    History of present illness:  See discharge summary dated 08/25/13  Hospital Course:  Acute alcoholic pancreatitis:likely related to continued ETOH abuse. Admitted for recurrent alcoholic pancreatitis. He was provided with IV hydration and antiemetics and IV pain medicine. He quickly improved requesting food but resisted transitioning pain med to po. His lipase trended downward. At discharge he was tolerating regular diet and taking po pain meds with complaints of "i don't like those pills". Developed worsening pain 08/25/13 late in day and discharge delayed. Diet back to clear liquids. On day of discharge diet advanced to low fat which he tolerated well.    Abdominal pain/nausea: related to #1. CT abdomen done 20 days ago yielded findings consistent with the patient's known recent pancreatitis. There is a 3 x 2.7 cm heterogeneous density in the pancreatic head could be due to pancreatic pseudocyst or pancreatic necrosis. There is a 1.8 cm low density between the pancreatic head and second portion of duodenum which is probably a developing pseudocyst.  There is inflammation of the adjacent duodenum with diffuse duodenal bowel wall thickening and surrounding inflammation. Pt had no episodes of vomiting during hospitalization and requested food on admission. At discharge, abdominal exam benign with soft abdomen, improved BS and less tenderness. Recommend follow up with PCP to trend symptoms and follow CT results as indicated. Of note, pt counseled that if he continued ETOH consumption, it was likely his abdominal pain would persist. He replied "mam, my drinking does not have a thing to do with my stomach pain".   Hypertension: uncontrolled. likely related to non-compliance in setting of polysubstance abuse. Patient admits to not taking medications. He was given clonidine and lisinopril initially with only fair control. Medications adjusted to increase lisinopril and discontinue clonidine and start hydralazine. At discharge BP control fair. Recommend close follow up with [facility] department.   Alcohol abuse: no s/sx withdrawal on admission. No s/sx withdrawal this hospitalization. Patient does have hx of withdrawal symptoms according to chart. Continue CIWA protocol.   Elevated alk phos: likely related to ETOH. Recent nuclear medicin hepatobiliary scan was unremarkable. Will need OP monitoring. AST and ALT within the limits of normal. Total bili within the limits of normal.   Tobacco abuse: nicotine patch. Counseled to stop smoking   History of seizures: etiology likely related to recent head trauma (6/14) in setting of polysubstance abuse and ETOH withdrawal. No seizure activity. Will continue keppra.   Noncompliance:   Procedures:  none  Consultations:  none  Discharge Exam: Filed Vitals:   08/26/13 0500  BP: 148/98  Pulse: 57  Temp: 97.8 F (36.6 C)  Resp: 20    General: awake watching TV, smiling,  pleasant Cardiovascular: RRR No MGR No LE edema Respiratory: normal effort BS clear bilaterally no wheeze Abdomen: flat soft  +BS non-tender to palpation  Discharge Instructions     Medication List    STOP taking these medications       cloNIDine 0.2 MG tablet  Commonly known as:  CATAPRES      TAKE these medications       hydrALAZINE 25 MG tablet  Commonly known as:  APRESOLINE  Take 1 tablet (25 mg total) by mouth every 8 (eight) hours.     levETIRAcetam 750 MG tablet  Commonly known as:  KEPPRA  Take 750 mg by mouth daily. Prescribed one tablet twice daily but the patient states that she takes one tablet daily     lisinopril 20 MG tablet  Commonly known as:  PRINIVIL,ZESTRIL  Take 1 tablet (20 mg total) by mouth daily.     omeprazole 20 MG capsule  Commonly known as:  PRILOSEC  Take 1 capsule (20 mg total) by mouth daily. For treatment of inflammation of your stomach lining.     ondansetron 4 MG tablet  Commonly known as:  ZOFRAN  Take 1 tablet (4 mg total) by mouth every 8 (eight) hours as needed for nausea.     oxyCODONE 5 MG immediate release tablet  Commonly known as:  Oxy IR/ROXICODONE  Take 1 tablet (5 mg total) by mouth every 3 (three) hours as needed for pain.     promethazine 25 MG tablet  Commonly known as:  PHENERGAN  Take 1 tablet (25 mg total) by mouth every 6 (six) hours as needed for nausea.       No Known Allergies    The results of significant diagnostics from this hospitalization (including imaging, microbiology, ancillary and laboratory) are listed below for reference.    Significant Diagnostic Studies: Ct Abdomen Pelvis W Contrast  08/03/2013   *RADIOLOGY REPORT*  Clinical Data: Recent hospitalization for pancreatitis.  Umbilical abdomen pain.  CT ABDOMEN AND PELVIS WITH CONTRAST  Technique:  Multidetector CT imaging of the abdomen and pelvis was performed following the standard protocol during bolus administration of intravenous contrast.  Contrast: 50mL OMNIPAQUE IOHEXOL 300 MG/ML  SOLN, OMNIPAQUE IOHEXOL 300 MG/ML  SOLN  Comparison: CT abdomen pelvis  November 05, 2012  Findings: There is 3 x 2.7 cm heterogeneous low density in the area of pancreatic head.  There is dilatation of pancreatic duct.  There is edema and thickening of duodenal wall.  There are multiple calcifications in the pancreas.  There is mild peri pancreatic inflammation particularly at the pancreatic head.  There is mild intrahepatic biliary ductal dilatation.  The liver is otherwise normal.  The gallbladder is normal.  The spleen is normal.  The bilateral adrenal glands are normal.  The previously described right kidney stone is not seen.  There is no hydronephrosis bilaterally.  The aorta is normal.  There is no small bowel obstruction or diverticulitis.  The appendix is normal.  Partial fluid filled bladder is normal.  There is mild atelectasis of the posterior lung bases.  There is no pleural effusion.  There are degenerative joint changes of the L5-S1 level with vacuum disc phenomenon, narrowed joint space and grade 1 anterior listhesis of L5 on S1. 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  IMPRESSION: Findings consistent with the patient's known recent pancreatitis. There is a 3 x 2.7 cm heterogeneous density in the pancreatic head could be due to pancreatic pseudocyst or  pancreatic necrosis. There is a 1.8 cm low density between the pancreatic head and second portion of duodenum which is probably a developing pseudocyst.  There is inflammation of the adjacent duodenum with diffuse duodenal bowel wall thickening and surrounding inflammation.   Original Report Authenticated By: Sherian Rein, M.D.   Nm Hepato W/eject Fract  08/04/2013   *RADIOLOGY REPORT*  Clinical Data:  Abdominal pain, history hypertension, pancreatitis, hyperlipidemia, ethanol, drug, and tobacco abuse  NUCLEAR MEDICINE HEPATOBILIARY IMAGING WITH GALLBLADDER EF:  Technique: Sequential images of the abdomen were obtained for 60 minutes following intravenous administration of radiopharmaceutical.  Patient then ingested 8  ounces of commercially available Ensure Plus and imaging was continued for 60 minutes.  A time-activity curve was generated from tracer within the gallbladder following Ensure Plus ingestion, and the gallbladder ejection fraction was calculated.  Radiopharmaceutical:  4.9 mCi Tc-71m mebrofenin  Comparison: None  Findings: Prompt tracer extraction from bloodstream, indicating normal hepatocellular function. Prompt excretion of tracer into biliary tree. Gallbladder visualized at 30 minutes. Small bowel visualized at 15 minutes. No hepatic retention of tracer.  Early emptying of tracer from gallbladder at time of fatty meal ingestion. Gallbladder then demonstrates partial refilling and then empties towards the conclusion of the exam. Subjectively gallbladder emptying is normal. Calculated gallbladder ejection fraction is 37%, normal. Patient experienced no symptoms following fatty meal ingestion.  IMPRESSION: Normal exam as above.  Normal values for gallbladder ejection fraction: > 30% for exams utilizing sincalide (CCK) > 33% for exams utilizing fatty meal stimulation with Ensure Plus   Original Report Authenticated By: Ulyses Southward, M.D.    Microbiology: No results found for this or any previous visit (from the past 240 hour(s)).   Labs: Basic Metabolic Panel:  Recent Labs Lab 08/22/13 2315 08/23/13 1149 08/24/13 0541  NA 140 140 136  K 3.2* 4.0 3.8  CL 103 103 102  CO2 27 29 26   GLUCOSE 127* 111* 110*  BUN 8 9 4*  CREATININE 0.77 0.87 0.72  CALCIUM 8.9 9.2 8.6   Liver Function Tests:  Recent Labs Lab 08/22/13 2315 08/23/13 1149 08/24/13 0541  AST 15 46* 22  ALT 7 12 13   ALKPHOS 121* 141* 184*  BILITOT 0.2* 0.5 0.7  PROT 7.1 7.1 6.2  ALBUMIN 2.8* 2.9* 2.4*    Recent Labs Lab 08/22/13 2315 08/23/13 1149 08/24/13 0541 08/26/13 0605  LIPASE 199* 274* 150* 44   No results found for this basename: AMMONIA,  in the last 168 hours CBC:  Recent Labs Lab 08/22/13 2315  08/23/13 1149 08/24/13 0541  WBC 4.8 5.1 3.9*  NEUTROABS  --  3.1  --   HGB 12.3* 12.6* 12.5*  HCT 35.9* 37.5* 37.0*  MCV 95.0 96.6 96.4  PLT 307 331 259   Cardiac Enzymes: No results found for this basename: CKTOTAL, CKMB, CKMBINDEX, TROPONINI,  in the last 168 hours BNP: BNP (last 3 results) No results found for this basename: PROBNP,  in the last 8760 hours CBG: No results found for this basename: GLUCAP,  in the last 168 hours     Signed:  Gwenyth Bender  [facility] 08/26/2013, 11:36 AM  Attending note:  Patient seen and examined. Above note reviewed.  Patient reports he is tolerating diet and pain is manageable.  He is requesting discharge home.  I am doubtful that he will comply with recommendations to abstain from further alcohol consumption.  MEMON,JEHANZEB ]

## 2013-08-26 NOTE — Progress Notes (Signed)
D/c instructions reviewed with patient.  Verbalized understanding.  Pt dc'd to home.  Schonewitz, Candelaria Stagers 08/26/2013

## 2013-09-17 ENCOUNTER — Encounter (HOSPITAL_COMMUNITY): Payer: Self-pay | Admitting: *Deleted

## 2013-09-17 ENCOUNTER — Emergency Department (HOSPITAL_COMMUNITY)
Admission: EM | Admit: 2013-09-17 | Discharge: 2013-09-17 | Disposition: A | Discharge status: Home / Self Care | Payer: Self-pay | Attending: Emergency Medicine | Admitting: Emergency Medicine

## 2013-09-17 DIAGNOSIS — Z862 Personal history of diseases of the blood and blood-forming organs and certain disorders involving the immune mechanism: Secondary | ICD-10-CM | POA: Insufficient documentation

## 2013-09-17 DIAGNOSIS — F1021 Alcohol dependence, in remission: Secondary | ICD-10-CM | POA: Insufficient documentation

## 2013-09-17 DIAGNOSIS — K859 Acute pancreatitis without necrosis or infection, unspecified: Secondary | ICD-10-CM | POA: Insufficient documentation

## 2013-09-17 DIAGNOSIS — R112 Nausea with vomiting, unspecified: Secondary | ICD-10-CM | POA: Insufficient documentation

## 2013-09-17 DIAGNOSIS — Z8709 Personal history of other diseases of the respiratory system: Secondary | ICD-10-CM | POA: Insufficient documentation

## 2013-09-17 DIAGNOSIS — I10 Essential (primary) hypertension: Secondary | ICD-10-CM | POA: Insufficient documentation

## 2013-09-17 DIAGNOSIS — Z87828 Personal history of other (healed) physical injury and trauma: Secondary | ICD-10-CM | POA: Insufficient documentation

## 2013-09-17 DIAGNOSIS — Z79899 Other long term (current) drug therapy: Secondary | ICD-10-CM | POA: Insufficient documentation

## 2013-09-17 DIAGNOSIS — F17200 Nicotine dependence, unspecified, uncomplicated: Secondary | ICD-10-CM | POA: Insufficient documentation

## 2013-09-17 DIAGNOSIS — Z8639 Personal history of other endocrine, nutritional and metabolic disease: Secondary | ICD-10-CM | POA: Insufficient documentation

## 2013-09-17 DIAGNOSIS — G40909 Epilepsy, unspecified, not intractable, without status epilepticus: Secondary | ICD-10-CM | POA: Insufficient documentation

## 2013-09-17 LAB — COMPREHENSIVE METABOLIC PANEL
AST: 36 U/L (ref 0–37)
Albumin: 3.3 g/dL — ABNORMAL LOW (ref 3.5–5.2)
Chloride: 104 mEq/L (ref 96–112)
Creatinine, Ser: 0.76 mg/dL (ref 0.50–1.35)
Potassium: 3.4 mEq/L — ABNORMAL LOW (ref 3.5–5.1)
Sodium: 139 mEq/L (ref 135–145)
Total Bilirubin: 0.8 mg/dL (ref 0.3–1.2)

## 2013-09-17 LAB — CBC WITH DIFFERENTIAL/PLATELET
Basophils Absolute: 0 10*3/uL (ref 0.0–0.1)
Basophils Relative: 0 % (ref 0–1)
MCHC: 34.5 g/dL (ref 30.0–36.0)
Monocytes Absolute: 0.5 10*3/uL (ref 0.1–1.0)
Neutro Abs: 5.2 10*3/uL (ref 1.7–7.7)
Neutrophils Relative %: 74 % (ref 43–77)
Platelets: 225 10*3/uL (ref 150–400)
RDW: 15.1 % (ref 11.5–15.5)

## 2013-09-17 MED ORDER — SODIUM CHLORIDE 0.9 % IV BOLUS (SEPSIS)
1000.0000 mL | Freq: Once | INTRAVENOUS | Status: AC
  Administered 2013-09-17: 1000 mL via INTRAVENOUS

## 2013-09-17 MED ORDER — PANTOPRAZOLE SODIUM 40 MG IV SOLR
40.0000 mg | Freq: Once | INTRAVENOUS | Status: AC
  Administered 2013-09-17: 40 mg via INTRAVENOUS
  Filled 2013-09-17: qty 40

## 2013-09-17 MED ORDER — PROMETHAZINE HCL 25 MG PO TABS: 25.0000 mg | ORAL_TABLET | Freq: Four times a day (QID) | ORAL | Status: DC | PRN

## 2013-09-17 MED ORDER — HYDROMORPHONE HCL PF 1 MG/ML IJ SOLN
1.0000 mg | Freq: Once | INTRAMUSCULAR | Status: AC
  Administered 2013-09-17: 1 mg via INTRAVENOUS
  Filled 2013-09-17: qty 1

## 2013-09-17 MED ORDER — OXYCODONE-ACETAMINOPHEN 5-325 MG PO TABS: 2.0000 | ORAL_TABLET | ORAL | Status: DC | PRN

## 2013-09-17 MED ORDER — ONDANSETRON HCL 4 MG/2ML IJ SOLN
4.0000 mg | Freq: Once | INTRAMUSCULAR | Status: AC
  Administered 2013-09-17: 4 mg via INTRAVENOUS
  Filled 2013-09-17: qty 2

## 2013-09-17 NOTE — ED Provider Notes (Signed)
CSN: [id number]     Arrival date & time 09/17/13  1639 History  This chart was scribed for Donnetta Hutching, MD by Shari Heritage, ED Scribe. The patient was seen in room APA04/APA04. Patient's care was started at 5:28 PM.    Chief Complaint  Patient presents with  . Abdominal Pain    The history is provided by the patient. No language interpreter was used.    HPI Comments: Paul [name] is a 47 y.o. male with a history of pancreatitis who presents to the Emergency Department complaining of moderate, constant mid lower abdominal pain onset 13.5 hours ago. There is associated emesis and nausea. He states that prior to arrival, he began to notice blood in the emesis. He admits to drinking liquor today. He reports that he has quit drinking in the past and was sober for 7 months. He denies any other symptoms at this time. His other medical history includes HTN, hyperlipidemia, and polysubstance abuse.    Past Medical History  Diagnosis Date  . Pancreatitis, acute     First episode earlier in 2012, hospitalized again in 02/2012 and 09/2012  . Hypertension     noncompliance  . Hyperlipidemia     noncompliance  . Lung collapse     h/o  . ETOH abuse   . Cocaine abuse   . DTs (delirium tremens)     history of  . Nephrolithiasis   . Tobacco abuse   . Seizure disorder 07/17/2013  . Noncompliance 07/17/2013  . Traumatic subdural hematoma 06/15/2013  . 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 Open skull fracture 06/15/2013  . Polysubstance abuse     etoh, cocaine   Past Surgical History  Procedure Laterality Date  . Mandible fracture surgery  2006  . Left axillary      surgery for deep laceration   Family History  Problem Relation Age of Onset  . Diabetes type II Mother   . Liver disease Neg Hx   . Colon cancer Neg Hx   . GI problems Neg Hx    History  Substance Use Topics  . Smoking status: Current Every Day Smoker -- 1.00 packs/day    Types: Cigarettes  . Smokeless tobacco:  Not on file  . Alcohol Use: Yes     Comment: Three 40oz per day    Review of Systems A complete 10 system review of systems was obtained and all systems are negative except as noted in the HPI and PMH.   Allergies  Review of patient's allergies indicates no known allergies.  Home Medications   Current Outpatient Rx  Name  Route  Sig  Dispense  Refill  . hydrALAZINE (APRESOLINE) 25 MG tablet   Oral   Take 1 tablet (25 mg total) by mouth every 8 (eight) hours.   90 tablet   0   . levETIRAcetam (KEPPRA) 750 MG tablet   Oral   Take 750 mg by mouth daily. Prescribed one tablet twice daily but the patient states that she takes one tablet daily         . lisinopril (PRINIVIL,ZESTRIL) 20 MG tablet   Oral   Take 1 tablet (20 mg total) by mouth daily.   30 tablet   0   . omeprazole (PRILOSEC) 20 MG capsule   Oral   Take 1 capsule (20 mg total) by mouth daily. For treatment of inflammation of your stomach lining.   30 capsule   3   . ondansetron (ZOFRAN) 4 MG  tablet   Oral   Take 1 tablet (4 mg total) by mouth every 8 (eight) hours as needed for nausea.   6 tablet   0   . oxyCODONE (OXY IR/ROXICODONE) 5 MG immediate release tablet   Oral   Take 1 tablet (5 mg total) by mouth every 3 (three) hours as needed for pain.   30 tablet   0   . promethazine (PHENERGAN) 25 MG tablet   Oral   Take 1 tablet (25 mg total) by mouth every 6 (six) hours as needed for nausea.   30 tablet   0    Triage Vitals: BP 218/130  Pulse 68  Temp(Src) 98.1 F (36.7 C)  Resp 20  Ht 6' (1.829 m)  Wt 167 lb (75.751 kg)  BMI 22.64 kg/m2  SpO2 100% Physical Exam  Nursing note and vitals reviewed. Constitutional: He is oriented to person, place, and time. He appears well-developed and well-nourished.  HENT:  Head: Normocephalic and atraumatic.  Eyes: Conjunctivae and EOM are normal. Pupils are equal, round, and reactive to light.  Neck: Normal range of motion. Neck supple.   Cardiovascular: Normal rate, regular rhythm and normal heart sounds.   Pulmonary/Chest: Effort normal and breath sounds normal.  Abdominal: Soft. Bowel sounds are normal. There is tenderness.  Tenderness to midline lower abdomen  Musculoskeletal: Normal range of motion.  Neurological: He is alert and oriented to person, place, and time.  Skin: Skin is warm and dry.  Psychiatric: He has a normal mood and affect.    ED Course  Procedures (including critical care time) DIAGNOSTIC STUDIES: Oxygen Saturation is 100% on room air, normal by my interpretation.    COORDINATION OF CARE: 5:42 PM- Patient is known to me and has history of alcohol abuse and related pancreatitis flare ups. Will give IV fluids, Zofran, and Protonix. Will order CBC with diff, blood lipase and CMP. Patient informed of current plan for treatment and evaluation and agrees with plan at this time.     Labs Review Labs Reviewed  COMPREHENSIVE METABOLIC PANEL - Abnormal; Notable for the following:    Potassium 3.4 (*)    Glucose, Bld 103 (*)    Albumin 3.3 (*)    Alkaline Phosphatase 205 (*)    All other components within normal limits  LIPASE, BLOOD - Abnormal; Notable for the following:    Lipase 105 (*)    All other components within normal limits  CBC WITH DIFFERENTIAL    Imaging Review No results found.  MDM  No diagnosis found. 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   Complains of typical abdominal pain associated with his pancreatitis.   Patient feels better after IV fluids and pain management. Lipase 105.   D/C meds percocet, phenergan 25 mg  I personally performed the services described in this documentation, which was scribed in my presence. The recorded information has been reviewed and is accurate.    Donnetta Hutching, MD 09/17/13 [phone]

## 2013-09-17 NOTE — ED Notes (Signed)
Abdominal pain with nausea and vomiting, states he vomited blood

## 2013-09-27 ENCOUNTER — Ambulatory Visit: Payer: Self-pay | Admitting: Gastroenterology

## 2013-09-27 ENCOUNTER — Telehealth: Payer: Self-pay | Admitting: Gastroenterology

## 2013-09-27 NOTE — Telephone Encounter (Signed)
Pt was a no show

## 2013-10-01 ENCOUNTER — Inpatient Hospital Stay (HOSPITAL_COMMUNITY)
Admission: EM | Admit: 2013-10-01 | Discharge: 2013-10-04 | DRG: 439 | Disposition: A | Discharge status: Home / Self Care | Payer: MEDICAID | Attending: Family Medicine | Admitting: Family Medicine

## 2013-10-01 ENCOUNTER — Encounter (HOSPITAL_COMMUNITY): Payer: Self-pay | Admitting: *Deleted

## 2013-10-01 DIAGNOSIS — F17200 Nicotine dependence, unspecified, uncomplicated: Secondary | ICD-10-CM | POA: Diagnosis present

## 2013-10-01 DIAGNOSIS — Z91199 Patient's noncompliance with other medical treatment and regimen due to unspecified reason: Secondary | ICD-10-CM

## 2013-10-01 DIAGNOSIS — F1410 Cocaine abuse, uncomplicated: Secondary | ICD-10-CM | POA: Diagnosis present

## 2013-10-01 DIAGNOSIS — E785 Hyperlipidemia, unspecified: Secondary | ICD-10-CM | POA: Diagnosis present

## 2013-10-01 DIAGNOSIS — F1010 Alcohol abuse, uncomplicated: Secondary | ICD-10-CM

## 2013-10-01 DIAGNOSIS — K861 Other chronic pancreatitis: Secondary | ICD-10-CM | POA: Diagnosis present

## 2013-10-01 DIAGNOSIS — K859 Acute pancreatitis without necrosis or infection, unspecified: Principal | ICD-10-CM | POA: Diagnosis present

## 2013-10-01 DIAGNOSIS — K852 Alcohol induced acute pancreatitis without necrosis or infection: Secondary | ICD-10-CM

## 2013-10-01 DIAGNOSIS — Z720 Tobacco use: Secondary | ICD-10-CM | POA: Diagnosis present

## 2013-10-01 DIAGNOSIS — G40909 Epilepsy, unspecified, not intractable, without status epilepticus: Secondary | ICD-10-CM | POA: Diagnosis present

## 2013-10-01 DIAGNOSIS — F1020 Alcohol dependence, uncomplicated: Secondary | ICD-10-CM | POA: Diagnosis present

## 2013-10-01 DIAGNOSIS — Z9119 Patient's noncompliance with other medical treatment and regimen: Secondary | ICD-10-CM

## 2013-10-01 DIAGNOSIS — I10 Essential (primary) hypertension: Secondary | ICD-10-CM | POA: Diagnosis present

## 2013-10-01 DIAGNOSIS — K920 Hematemesis: Secondary | ICD-10-CM | POA: Diagnosis present

## 2013-10-01 DIAGNOSIS — Z833 Family history of diabetes mellitus: Secondary | ICD-10-CM

## 2013-10-01 HISTORY — DX: Unspecified convulsions: R56.9

## 2013-10-01 LAB — COMPREHENSIVE METABOLIC PANEL
ALT: 36 U/L (ref 0–53)
AST: 43 U/L — ABNORMAL HIGH (ref 0–37)
Albumin: 3.7 g/dL (ref 3.5–5.2)
Alkaline Phosphatase: 476 U/L — ABNORMAL HIGH (ref 39–117)
BUN: 9 mg/dL (ref 6–23)
CO2: 30 mEq/L (ref 19–32)
Calcium: 8.9 mg/dL (ref 8.4–10.5)
Creatinine, Ser: 0.98 mg/dL (ref 0.50–1.35)
Potassium: 3.7 mEq/L (ref 3.5–5.1)
Total Bilirubin: 0.4 mg/dL (ref 0.3–1.2)
Total Protein: 8 g/dL (ref 6.0–8.3)

## 2013-10-01 LAB — CBC WITH DIFFERENTIAL/PLATELET
Basophils Absolute: 0 10*3/uL (ref 0.0–0.1)
Basophils Relative: 0 % (ref 0–1)
Eosinophils Absolute: 0.1 10*3/uL (ref 0.0–0.7)
Eosinophils Relative: 1 % (ref 0–5)
HCT: 40.1 % (ref 39.0–52.0)
Hemoglobin: 13.6 g/dL (ref 13.0–17.0)
Lymphs Abs: 2.1 10*3/uL (ref 0.7–4.0)
MCH: 32.5 pg (ref 26.0–34.0)
MCHC: 33.9 g/dL (ref 30.0–36.0)
MCV: 95.7 fL (ref 78.0–100.0)
Monocytes Absolute: 0.4 10*3/uL (ref 0.1–1.0)
Monocytes Relative: 7 % (ref 3–12)
Neutro Abs: 3.4 10*3/uL (ref 1.7–7.7)
Neutrophils Relative %: 56 % (ref 43–77)
RDW: 15.1 % (ref 11.5–15.5)

## 2013-10-01 LAB — SAMPLE TO BLOOD BANK

## 2013-10-01 LAB — ETHANOL: Alcohol, Ethyl (B): 224 mg/dL — ABNORMAL HIGH (ref 0–11)

## 2013-10-01 LAB — LIPASE, BLOOD: Lipase: 465 U/L — ABNORMAL HIGH (ref 11–59)

## 2013-10-01 LAB — AMYLASE: Amylase: 269 U/L — ABNORMAL HIGH (ref 0–105)

## 2013-10-01 MED ORDER — ONDANSETRON HCL 4 MG/2ML IJ SOLN
4.0000 mg | Freq: Once | INTRAMUSCULAR | Status: AC
  Administered 2013-10-01: 4 mg via INTRAVENOUS
  Filled 2013-10-01: qty 2

## 2013-10-01 MED ORDER — PANTOPRAZOLE SODIUM 40 MG IV SOLR
40.0000 mg | Freq: Once | INTRAVENOUS | Status: AC
  Administered 2013-10-01: 40 mg via INTRAVENOUS
  Filled 2013-10-01: qty 40

## 2013-10-01 MED ORDER — HYDRALAZINE HCL 20 MG/ML IJ SOLN
10.0000 mg | Freq: Once | INTRAMUSCULAR | Status: AC
  Administered 2013-10-01: 10 mg via INTRAVENOUS
  Filled 2013-10-01: qty 1

## 2013-10-01 MED ORDER — SODIUM CHLORIDE 0.9 % IV BOLUS (SEPSIS)
1000.0000 mL | Freq: Once | INTRAVENOUS | Status: AC
  Administered 2013-10-01: 1000 mL via INTRAVENOUS

## 2013-10-01 MED ORDER — FAMOTIDINE IN NACL 20-0.9 MG/50ML-% IV SOLN
20.0000 mg | Freq: Once | INTRAVENOUS | Status: AC
  Administered 2013-10-01: 20 mg via INTRAVENOUS
  Filled 2013-10-01: qty 50

## 2013-10-01 MED ORDER — HYDROMORPHONE HCL PF 1 MG/ML IJ SOLN
1.0000 mg | Freq: Once | INTRAMUSCULAR | Status: AC
  Administered 2013-10-01: 1 mg via INTRAVENOUS
  Filled 2013-10-01: qty 1

## 2013-10-01 MED ORDER — HYDROMORPHONE HCL PF 1 MG/ML IJ SOLN: 1.0000 mg | Freq: Once | INTRAMUSCULAR | Status: DC

## 2013-10-01 NOTE — ED Notes (Signed)
Pt advised of NPO status

## 2013-10-01 NOTE — ED Notes (Signed)
abd pain, hemetemesis,  Drank wine today.

## 2013-10-01 NOTE — ED Notes (Signed)
Pt BP is elevated, pt states he has not taking meds in 2 days, states it makes him sick.

## 2013-10-01 NOTE — ED Provider Notes (Signed)
Medical screening examination/treatment/procedure(s) were conducted as a shared visit with non-physician practitioner(s) and myself.  I personally evaluated the patient during the encounter.  Patient has no history of pancreatitis which has been exacerbated by drinking alcohol.  Tender epigastrium. Admit  Donnetta Hutching, MD 10/01/13 [phone]

## 2013-10-01 NOTE — ED Provider Notes (Signed)
CSN: [id number]     Arrival date & time 10/01/13  1955 History   First MD Initiated Contact with Patient 10/01/13 2109     Chief Complaint  Patient presents with  . Abdominal Pain   (Consider location/radiation/quality/duration/timing/severity/associated sxs/prior Treatment) HPI Comments: Paul [name] is a 47 y.o. Male presenting with acute onset of left upper quadrant abdominal pain, nausea and vomiting which is consistent with his chronic pancreatitis.  He is a heavy drinker, and states he drank a full bottle of wine this morning.  He denies fevers or chills.  He had dark red streaks of blood mixed with the 2nd and third episode of vomiting similar to the last time he was seen for this condition last month.  He has had no shortness of breath or chest pain.  He denies current intoxication.       The history is provided by the patient.    Past Medical History  Diagnosis Date  . Pancreatitis, acute     First episode earlier in 2012, hospitalized again in 02/2012 and 09/2012  . Hypertension     noncompliance  . Hyperlipidemia     noncompliance  . Lung collapse     h/o  . ETOH abuse   . Cocaine abuse   . DTs (delirium tremens)     history of  . Tobacco abuse   . Seizure disorder 07/17/2013  . Noncompliance 07/17/2013  . Traumatic subdural hematoma 06/15/2013  . 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 Open skull fracture 06/15/2013  . Polysubstance abuse     etoh, cocaine  . Nephrolithiasis   . Seizures    Past Surgical History  Procedure Laterality Date  . Mandible fracture surgery  2006  . Left axillary      surgery for deep laceration   Family History  Problem Relation Age of Onset  . Diabetes type II Mother   . Liver disease Neg Hx   . Colon cancer Neg Hx   . GI problems Neg Hx    History  Substance Use Topics  . Smoking status: Current Every Day Smoker -- 1.00 packs/day    Types: Cigarettes  . Smokeless tobacco: Not on file  . Alcohol Use: Yes   Comment: Three 40oz per day    Review of Systems  Constitutional: Negative for fever and chills.  HENT: Negative for congestion, sore throat and neck pain.   Eyes: Negative.   Respiratory: Negative for chest tightness and shortness of breath.   Cardiovascular: Negative for chest pain.  Gastrointestinal: Positive for nausea, vomiting and abdominal pain. Negative for diarrhea and blood in stool.  Genitourinary: Negative.   Musculoskeletal: Negative for joint swelling and arthralgias.  Skin: Negative.  Negative for rash and wound.  Neurological: Negative for dizziness, weakness, light-headedness, numbness and headaches.  Psychiatric/Behavioral: Negative.     Allergies  Review of patient's allergies indicates no known allergies.  Home Medications   Current Outpatient Rx  Name  Route  Sig  Dispense  Refill  . hydrALAZINE (APRESOLINE) 25 MG tablet   Oral   Take 1 tablet (25 mg total) by mouth every 8 (eight) hours.   90 tablet   0   . levETIRAcetam (KEPPRA) 750 MG tablet   Oral   Take 750 mg by mouth daily.          Marland Kitchen lisinopril (PRINIVIL,ZESTRIL) 20 MG tablet   Oral   Take 1 tablet (20 mg total) by mouth daily.   30 tablet  0   . omeprazole (PRILOSEC) 20 MG capsule   Oral   Take 1 capsule (20 mg total) by mouth daily. For treatment of inflammation of your stomach lining.   30 capsule   3   . promethazine (PHENERGAN) 25 MG tablet   Oral   Take 1 tablet (25 mg total) by mouth every 6 (six) hours as needed for nausea.   20 tablet   0    BP 211/139  Pulse 101  Temp(Src) 97.1 F (36.2 C) (Oral)  Resp 18  Ht 6' (1.829 m)  Wt 167 lb (75.751 kg)  BMI 22.64 kg/m2  SpO2 98% Physical Exam  Nursing note and vitals reviewed. Constitutional: He appears well-developed and well-nourished.  HENT:  Head: Normocephalic and atraumatic.  Eyes: Conjunctivae are normal.  Neck: Normal range of motion.  Cardiovascular: Regular rhythm, normal heart sounds and intact distal  pulses.  Tachycardia present.   Pulmonary/Chest: Effort normal and breath sounds normal. He has no wheezes.  Abdominal: Soft. Bowel sounds are normal. There is tenderness in the epigastric area, periumbilical area and left upper quadrant. There is no guarding and negative Murphy's sign.  Musculoskeletal: Normal range of motion.  Neurological: He is alert.  Skin: Skin is warm and dry.  Psychiatric: He has a normal mood and affect.    ED Course  Procedures (including critical care time) Labs Review Labs Reviewed  AMYLASE - Abnormal; Notable for the following:    Amylase 269 (*)    All other components within normal limits  CBC WITH DIFFERENTIAL - Abnormal; Notable for the following:    RBC 4.19 (*)    All other components within normal limits  COMPREHENSIVE METABOLIC PANEL - Abnormal; Notable for the following:    Glucose, Bld 103 (*)    AST 43 (*)    Alkaline Phosphatase 476 (*)    All other components within normal limits  ETHANOL - Abnormal; Notable for the following:    Alcohol, Ethyl (B) 224 (*)    All other components within normal limits  LIPASE, BLOOD - Abnormal; Notable for the following:    Lipase 465 (*)    All other components within normal limits  SAMPLE TO BLOOD BANK   Imaging Review No results found.  MDM   1. Acute alcoholic pancreatitis   2. Hypertension    Spoke with Dr Orvan Falconer who will admit patient.  Temp admit orders completed.    Burgess Amor, PA-C 10/01/13 2309

## 2013-10-02 DIAGNOSIS — I10 Essential (primary) hypertension: Secondary | ICD-10-CM

## 2013-10-02 DIAGNOSIS — K859 Acute pancreatitis without necrosis or infection, unspecified: Principal | ICD-10-CM

## 2013-10-02 DIAGNOSIS — F1410 Cocaine abuse, uncomplicated: Secondary | ICD-10-CM

## 2013-10-02 DIAGNOSIS — F1010 Alcohol abuse, uncomplicated: Secondary | ICD-10-CM

## 2013-10-02 LAB — URINALYSIS, ROUTINE W REFLEX MICROSCOPIC
Glucose, UA: 100 mg/dL — AB
Hgb urine dipstick: NEGATIVE
Leukocytes, UA: NEGATIVE
Nitrite: NEGATIVE
Protein, ur: NEGATIVE mg/dL
Specific Gravity, Urine: >1.03 — ABNORMAL HIGH (ref 1.005–1.030)
pH: 6 (ref 5.0–8.0)

## 2013-10-02 LAB — COMPREHENSIVE METABOLIC PANEL
Albumin: 3.3 g/dL — ABNORMAL LOW (ref 3.5–5.2)
BUN: 9 mg/dL (ref 6–23)
Calcium: 8.7 mg/dL (ref 8.4–10.5)
Creatinine, Ser: 0.81 mg/dL (ref 0.50–1.35)
GFR calc Af Amer: >90 mL/min (ref >90)
Glucose, Bld: 60 mg/dL — ABNORMAL LOW (ref 70–99)
Potassium: 3.9 mEq/L (ref 3.5–5.1)
Sodium: 144 mEq/L (ref 135–145)
Total Bilirubin: 0.7 mg/dL (ref 0.3–1.2)
Total Protein: 7.3 g/dL (ref 6.0–8.3)

## 2013-10-02 LAB — RAPID URINE DRUG SCREEN, HOSP PERFORMED
Barbiturates: NOT DETECTED
Benzodiazepines: NOT DETECTED
Cocaine: POSITIVE — AB
Opiates: NOT DETECTED
Tetrahydrocannabinol: NOT DETECTED

## 2013-10-02 LAB — APTT: aPTT: 35 seconds (ref 24–37)

## 2013-10-02 LAB — PROTIME-INR: INR: 1.07 (ref 0.00–1.49)

## 2013-10-02 LAB — PREPARE RBC (CROSSMATCH)

## 2013-10-02 LAB — CBC
HCT: 40.1 % (ref 39.0–52.0)
Hemoglobin: 13.7 g/dL (ref 13.0–17.0)
MCHC: 34.2 g/dL (ref 30.0–36.0)
RBC: 4.2 MIL/uL — ABNORMAL LOW (ref 4.22–5.81)
RDW: 15.1 % (ref 11.5–15.5)
WBC: 5.9 10*3/uL (ref 4.0–10.5)

## 2013-10-02 LAB — LIPASE, BLOOD: Lipase: 143 U/L — ABNORMAL HIGH (ref 11–59)

## 2013-10-02 LAB — ABO/RH: ABO/RH(D): B POS

## 2013-10-02 MED ORDER — LORAZEPAM 2 MG/ML IJ SOLN
0.0000 mg | Freq: Four times a day (QID) | INTRAMUSCULAR | Status: AC
  Administered 2013-10-02: 1 mg via INTRAVENOUS
  Administered 2013-10-02 – 2013-10-03 (×2): 2 mg via INTRAVENOUS
  Filled 2013-10-02 (×3): qty 1

## 2013-10-02 MED ORDER — ADULT MULTIVITAMIN W/MINERALS CH
1.0000 | ORAL_TABLET | Freq: Every day | ORAL | Status: DC
  Administered 2013-10-02 – 2013-10-03 (×2): 1 via ORAL
  Filled 2013-10-02 (×3): qty 1

## 2013-10-02 MED ORDER — HYDRALAZINE HCL 20 MG/ML IJ SOLN
10.0000 mg | INTRAMUSCULAR | Status: DC | PRN
  Administered 2013-10-02: 10 mg via INTRAVENOUS
  Filled 2013-10-02: qty 1

## 2013-10-02 MED ORDER — HYDRALAZINE HCL 20 MG/ML IJ SOLN: 10.0000 mg | INTRAMUSCULAR | Status: DC | PRN

## 2013-10-02 MED ORDER — FOLIC ACID 1 MG PO TABS
1.0000 mg | ORAL_TABLET | Freq: Every day | ORAL | Status: DC
  Administered 2013-10-02 – 2013-10-03 (×2): 1 mg via ORAL
  Filled 2013-10-02 (×3): qty 1

## 2013-10-02 MED ORDER — ONDANSETRON HCL 4 MG/2ML IJ SOLN: 4.0000 mg | INTRAMUSCULAR | Status: DC | PRN

## 2013-10-02 MED ORDER — LEVETIRACETAM 500 MG PO TABS
750.0000 mg | ORAL_TABLET | Freq: Every day | ORAL | Status: DC
  Administered 2013-10-02 – 2013-10-03 (×2): 750 mg via ORAL
  Filled 2013-10-02 (×3): qty 2

## 2013-10-02 MED ORDER — HYDRALAZINE HCL 20 MG/ML IJ SOLN
10.0000 mg | Freq: Four times a day (QID) | INTRAMUSCULAR | Status: DC | PRN
  Administered 2013-10-02 – 2013-10-04 (×6): 10 mg via INTRAVENOUS
  Filled 2013-10-02 (×6): qty 1

## 2013-10-02 MED ORDER — ONDANSETRON HCL 4 MG/2ML IJ SOLN: 4.0000 mg | Freq: Three times a day (TID) | INTRAMUSCULAR | Status: DC | PRN

## 2013-10-02 MED ORDER — LISINOPRIL 10 MG PO TABS
20.0000 mg | ORAL_TABLET | Freq: Once | ORAL | Status: AC
  Administered 2013-10-02: 20 mg via ORAL
  Filled 2013-10-02: qty 2

## 2013-10-02 MED ORDER — LORAZEPAM 1 MG PO TABS
1.0000 mg | ORAL_TABLET | Freq: Four times a day (QID) | ORAL | Status: DC | PRN
  Administered 2013-10-02: 1 mg via ORAL
  Filled 2013-10-02: qty 1

## 2013-10-02 MED ORDER — DIPHENHYDRAMINE HCL 50 MG/ML IJ SOLN
25.0000 mg | Freq: Once | INTRAMUSCULAR | Status: AC
  Administered 2013-10-02: 25 mg via INTRAVENOUS
  Filled 2013-10-02: qty 1

## 2013-10-02 MED ORDER — HYDRALAZINE HCL 25 MG PO TABS
25.0000 mg | ORAL_TABLET | Freq: Four times a day (QID) | ORAL | Status: DC
  Administered 2013-10-02 – 2013-10-03 (×6): 25 mg via ORAL
  Filled 2013-10-02 (×7): qty 1

## 2013-10-02 MED ORDER — THIAMINE HCL 100 MG/ML IJ SOLN: 100.0000 mg | Freq: Every day | INTRAMUSCULAR | Status: DC

## 2013-10-02 MED ORDER — ACETAMINOPHEN 325 MG PO TABS
650.0000 mg | ORAL_TABLET | Freq: Once | ORAL | Status: DC
  Filled 2013-10-02: qty 2

## 2013-10-02 MED ORDER — PANTOPRAZOLE SODIUM 40 MG IV SOLR
40.0000 mg | Freq: Two times a day (BID) | INTRAVENOUS | Status: DC
  Administered 2013-10-02: 40 mg via INTRAVENOUS
  Filled 2013-10-02: qty 40

## 2013-10-02 MED ORDER — LORAZEPAM 2 MG/ML IJ SOLN: 0.0000 mg | Freq: Two times a day (BID) | INTRAMUSCULAR | Status: DC

## 2013-10-02 MED ORDER — SODIUM CHLORIDE 0.9 % IV SOLN: INTRAVENOUS | Status: DC

## 2013-10-02 MED ORDER — PANTOPRAZOLE SODIUM 40 MG PO TBEC
40.0000 mg | DELAYED_RELEASE_TABLET | Freq: Two times a day (BID) | ORAL | Status: DC
  Administered 2013-10-02 – 2013-10-03 (×3): 40 mg via ORAL
  Filled 2013-10-02 (×4): qty 1

## 2013-10-02 MED ORDER — ACETAMINOPHEN 325 MG PO TABS
650.0000 mg | ORAL_TABLET | Freq: Four times a day (QID) | ORAL | Status: DC | PRN
  Administered 2013-10-03: 650 mg via ORAL
  Filled 2013-10-02: qty 2

## 2013-10-02 MED ORDER — LISINOPRIL 10 MG PO TABS
20.0000 mg | ORAL_TABLET | Freq: Every day | ORAL | Status: DC
  Administered 2013-10-02: 20 mg via ORAL
  Filled 2013-10-02: qty 2

## 2013-10-02 MED ORDER — VITAMIN B-1 100 MG PO TABS
100.0000 mg | ORAL_TABLET | Freq: Every day | ORAL | Status: DC
  Administered 2013-10-02 – 2013-10-03 (×2): 100 mg via ORAL
  Filled 2013-10-02 (×3): qty 1

## 2013-10-02 MED ORDER — HYDROMORPHONE HCL PF 1 MG/ML IJ SOLN
0.5000 mg | INTRAMUSCULAR | Status: DC | PRN
  Administered 2013-10-02 – 2013-10-03 (×5): 0.5 mg via INTRAVENOUS
  Filled 2013-10-02 (×6): qty 1

## 2013-10-02 MED ORDER — FOLIC ACID 5 MG/ML IJ SOLN
1.0000 mg | Freq: Every day | INTRAMUSCULAR | Status: DC
Start: 2013-10-02 — End: 2013-10-02
  Filled 2013-10-02 (×3): qty 0.2

## 2013-10-02 MED ORDER — LORAZEPAM 2 MG/ML IJ SOLN: 1.0000 mg | Freq: Four times a day (QID) | INTRAMUSCULAR | Status: DC | PRN

## 2013-10-02 MED ORDER — DIPHENHYDRAMINE HCL 25 MG PO CAPS
25.0000 mg | ORAL_CAPSULE | Freq: Once | ORAL | Status: DC
  Filled 2013-10-02: qty 1

## 2013-10-02 MED ORDER — THIAMINE HCL 100 MG/ML IJ SOLN
100.0000 mg | Freq: Every day | INTRAMUSCULAR | Status: DC
  Filled 2013-10-02: qty 2

## 2013-10-02 MED ORDER — POTASSIUM CHLORIDE IN NACL 20-0.9 MEQ/L-% IV SOLN
INTRAVENOUS | Status: DC
  Administered 2013-10-02 (×2): via INTRAVENOUS

## 2013-10-02 MED ORDER — HYDRALAZINE HCL 25 MG PO TABS
25.0000 mg | ORAL_TABLET | Freq: Three times a day (TID) | ORAL | Status: DC
  Administered 2013-10-02: 25 mg via ORAL
  Filled 2013-10-02: qty 1

## 2013-10-02 MED ORDER — LISINOPRIL 10 MG PO TABS
40.0000 mg | ORAL_TABLET | Freq: Every day | ORAL | Status: DC
  Administered 2013-10-03: 40 mg via ORAL
  Filled 2013-10-02 (×2): qty 4

## 2013-10-02 MED ORDER — SODIUM CHLORIDE 0.9 % IJ SOLN
3.0000 mL | Freq: Two times a day (BID) | INTRAMUSCULAR | Status: DC
  Administered 2013-10-02 – 2013-10-03 (×4): 3 mL via INTRAVENOUS

## 2013-10-02 MED ORDER — FLEET ENEMA 7-19 GM/118ML RE ENEM: 1.0000 | ENEMA | Freq: Once | RECTAL | Status: AC | PRN

## 2013-10-02 MED ORDER — ACETAMINOPHEN 650 MG RE SUPP: 650.0000 mg | Freq: Four times a day (QID) | RECTAL | Status: DC | PRN

## 2013-10-02 MED ORDER — SODIUM CHLORIDE 0.9 % IV BOLUS (SEPSIS)
1000.0000 mL | Freq: Once | INTRAVENOUS | Status: AC
  Administered 2013-10-02: 1000 mL via INTRAVENOUS

## 2013-10-02 MED ORDER — HYDROMORPHONE HCL PF 1 MG/ML IJ SOLN
0.5000 mg | INTRAMUSCULAR | Status: DC | PRN
  Administered 2013-10-02 (×3): 1 mg via INTRAVENOUS
  Filled 2013-10-02 (×3): qty 1

## 2013-10-02 NOTE — Progress Notes (Signed)
[facility] PROGRESS NOTE  KENDON SEDENO ZOX:[id number] DOB: Jul 02, 1966 DOA: 10/01/2013 PCP: No PCP Per Patient  Assessment/Plan: 1. Acute alcoholic pancreatitis with associated abdominal pain, hematemesis, nausea and vomiting: Much improved clinically. Lipase trending downwards. Clear liquids. 2. Alcohol intoxication: Appears resolved. 3. Polysubstance abuse: Alcohol, cocaine, cigarette smoker.  Monitor for withdrawal. CIWA. Recommend cessation. I discussed alcohol and cocaine will make this problem worse. No interest displayed. 4. Hypertension: Likely rebound hypertension secondary to noncompliance secondary to vomiting. Adjust antihypertensives. 5. Chronic pancreatitis   Advanced to clear liquids.  Repeat lipase in the morning  Decrease IV Dilaudid dose and frequency  Monitor for withdrawal  Anticipate discharge within 48 hours  Pending studies:   Urine drug screen  Urinalysis  Code Status: full code DVT prophylaxis: SCDs Family Communication: None present Disposition Plan: Home when improved  Brendia Sacks, MD  [facility]  Pager [phone] If 7PM-7AM, please contact night-coverage at [email or web address], password [email or web address] 10/02/2013, 10:20 AM  LOS: 1 day   Summary: 47 year old man with history of polysubstance abuse including cocaine, alcohol and tobacco. He did to the emergency department with abdominal pain. Admitted for acute pancreatitis.  Consultants:    Procedures:     HPI/Subjective: Feels better today. No vomiting. He has been drinking soda this morning. Pain much improved.  Objective: Filed Vitals:   10/02/13 0044 10/02/13 0437 10/02/13 0438 10/02/13 0440  BP: 177/120 180/109 198/126 175/138  Pulse: 106 96 79 109  Temp: 97.9 F (36.6 C) 97.4 F (36.3 C)    TempSrc: Oral Oral    Resp: 20 20    Height:      Weight: 67.2 kg (148 lb 2.4 oz) 68.3 kg (150 lb 9.2 oz)    SpO2: 100% 100% 100% 99%    Intake/Output Summary (Last 24 hours)  at 10/02/13 1020 Last data filed at 10/01/13 2335  Gross per 24 hour  Intake   1050 ml  Output      0 ml  Net   1050 ml     Filed Weights   10/01/13 2017 10/02/13 0044 10/02/13 0437  Weight: 75.751 kg (167 lb) 67.2 kg (148 lb 2.4 oz) 68.3 kg (150 lb 9.2 oz)    Exam:   Afebrile, hypertensive. Vitals otherwise unremarkable.  Appears calm and comfortable.   Cardiovascular: Regular rate and rhythm. No murmur, rub, gallop.  Respiratory: Clear to auscultation bilaterally. No wheezes, rales, rhonchi. Normal respiratory effort.   Abdomen: Soft, nondistended, mild epigastric tenderness  Data Reviewed:  Basic metabolic panel unremarkable.  Lipase 465 >> 143  AST now normal.  CBC within normal limits.  Hypoglycemia blood sugar 60  Scheduled Meds: . acetaminophen  650 mg Oral Once  . diphenhydrAMINE  25 mg Oral Once  . folic acid  1 mg Oral Daily  . folic acid  1 mg Intravenous Daily  . LORazepam  0-4 mg Intravenous Q6H   Followed by  . [START ON 10/04/2013] LORazepam  0-4 mg Intravenous Q12H  . multivitamin with minerals  1 tablet Oral Daily  . pantoprazole (PROTONIX) IV  40 mg Intravenous Q12H  . sodium chloride  3 mL Intravenous Q12H  . thiamine  100 mg Oral Daily   Or  . thiamine  100 mg Intravenous Daily   Continuous Infusions: . 0.9 % NaCl with KCl 20 mEq / L 150 mL/hr at 10/02/13 0230    Active Problems:   Cocaine abuse   Tobacco abuse   Pancreatitis, alcoholic, acute   Hypertension  ETOH abuse   Noncompliance   Time spent 20 minutes

## 2013-10-02 NOTE — Plan of Care (Signed)
Problem: Phase I Progression Outcomes Goal: Hemodynamically stable Outcome: Progressing Pt is hypertensive Goal: Other Phase I Outcomes/Goals Outcome: Completed/Met Date Met:  10/02/13 CIWA q6hours

## 2013-10-02 NOTE — H&P (Signed)
[facility] History and Physical  Paul [name]  UJW:[id number]  DOB: [birth year]   DOA: 10/02/2013   PCP:   No PCP Per Patient   Chief Complaint:  abd pain and vomiting since this evening (9pm)   HPI: Paul [name] is a 47 y.o. male.   Middle-aged [place] American gentleman with known polysubstance abuse namely tobacco alcohol and cocaine, and a history of recurrent pancreatitis, presents with acute abdominal pain and vomiting up dark material since this this evening.  He reports one pint of wine and a 16 ounce beer today. Last cocaine use today  In the emergency room he was found to be intoxicated with elevated serum lipase and hospitalist called for admission. He has not vomited since arrival to the emergency room  He denies dizziness or lightheadedness; denies bloody or black stool     Rewiew of Systems:   All systems negative except as marked bold or noted in the HPI;  Constitutional:    malaise, fever and chills. ;  Eyes:   eye pain, redness and discharge. ;  ENMT:   ear pain, hoarseness, nasal congestion, sinus pressure and sore throat. ;  Cardiovascular:    chest pain, palpitations, diaphoresis, dyspnea and peripheral edema.  Respiratory:   cough, hemoptysis, wheezing and stridor. ;  Gastrointestinal:  nausea, vomiting, diarrhea, constipation, abdominal pain, melena, blood in stool, hematemesis, jaundice and rectal bleeding. unusual weight loss..  30 lbs in 30 days Genitourinary:    frequency, dysuria, incontinence,flank pain and hematuria; Musculoskeletal:   Episodic back pain and neck pain.  swelling and trauma.;  Skin: .  pruritus, rash, abrasions, bruising and skin lesion.; ulcerations Neuro:    headache, lightheadedness tonight and neck stiffness.  weakness, altered level of consciousness, altered mental status, extremity weakness, burning feet, involuntary movement, seizure and syncope.  Psych:    anxiety, depression, insomnia, tearfulness, panic attacks,  hallucinations, paranoia, suicidal or homicidal ideation    Past Medical History  Diagnosis Date  . Pancreatitis, acute     First episode earlier in 2012, hospitalized again in 02/2012 and 09/2012  . Hypertension     noncompliance  . Hyperlipidemia     noncompliance  . Lung collapse     h/o  . ETOH abuse   . Cocaine abuse   . DTs (delirium tremens)     history of  . Tobacco abuse   . Seizure disorder 07/17/2013  . Noncompliance 07/17/2013  . Traumatic subdural hematoma 06/15/2013  . 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 Open skull fracture 06/15/2013  . Polysubstance abuse     etoh, cocaine  . Nephrolithiasis   . Seizures     Past Surgical History  Procedure Laterality Date  . Mandible fracture surgery  2006  . Left axillary      surgery for deep laceration    Medications:  HOME MEDS: Prior to Admission medications   Medication Sig Start Date End Date Taking? Authorizing Provider  hydrALAZINE (APRESOLINE) 25 MG tablet Take 1 tablet (25 mg total) by mouth every 8 (eight) hours. 08/26/13   Gwenyth Bender, NP  levETIRAcetam (KEPPRA) 750 MG tablet Take 750 mg by mouth daily.     Historical Provider, MD  lisinopril (PRINIVIL,ZESTRIL) 20 MG tablet Take 1 tablet (20 mg total) by mouth daily. 08/26/13   Gwenyth Bender, NP  omeprazole (PRILOSEC) 20 MG capsule Take 1 capsule (20 mg total) by mouth daily. For treatment of inflammation of your stomach lining. 07/20/13   Angelique Blonder  Fisher, MD  promethazine (PHENERGAN) 25 MG tablet Take 1 tablet (25 mg total) by mouth every 6 (six) hours as needed for nausea. 09/17/13   Donnetta Hutching, MD     Allergies:  No Known Allergies  Social History:   reports that he has been smoking Cigarettes.  He has been smoking about 1.00 pack per day. He does not have any smokeless tobacco history on file. He reports that  drinks alcohol. He reports that he uses illicit drugs (Cocaine) about 3 times per week.  ippd; last used cocaine today Family  History: Family History  Problem Relation Age of Onset  . Diabetes type II Mother   . Liver disease Neg Hx   . Colon cancer Neg Hx   . GI problems Neg Hx      Physical Exam: Filed Vitals:   10/01/13 2017 10/01/13 2237 10/01/13 2355 10/02/13 0044  BP: 194/137 211/139 188/125 177/120  Pulse: 107 101 100 106  Temp: 97.1 F (36.2 C)   97.9 F (36.6 C)  TempSrc: Oral   Oral  Resp: 20 18 20 20   Height: 6' (1.829 m)     Weight: 75.751 kg (167 lb)   67.2 kg (148 lb 2.4 oz)  SpO2: 100% 98% 98% 100%   Blood pressure 177/120, pulse 106, temperature 97.9 F (36.6 C), temperature source Oral, resp. rate 20, height 6' (1.829 m), weight 67.2 kg (148 lb 2.4 oz), SpO2 100.00%. Body mass index is 20.09 kg/(m^2).   GEN:  Pleasant  but intoxicated [place] gentleman complaining of abdominal pain ; cooperative with exam PSYCH:  alert and oriented ;  ; affect is appropriate. HEENT: Mucous membranes pink dry  and anicteric; Breasts:: Not examined CHEST WALL: No tenderness CHEST: Normal respiration, clear to auscultation bilaterally HEART: R tachycardic regular BACK: No kyphosis no scoliosis; no CVA tenderness ABDOMEN: soft  epigastric tenderness ; no masses, no organomegaly, normal abdominal bowel sounds; no pannus; no intertriginous candida. Rectal Exam: Not done EXTREMITIES: ; no edema; no ulcerations. Genitalia: not examined PULSES: 2+ and symmetric SKIN:flushed skin especially upper chest  CNS: Cranial nerves 2-12 grossly intact no gross  focal lateralizing neurologic deficit   Labs on Admission:  Basic Metabolic Panel:  Recent Labs Lab 10/01/13 2023  NA 144  K 3.7  CL 101  CO2 30  GLUCOSE 103*  BUN 9  CREATININE 0.98  CALCIUM 8.9   Liver Function Tests:  Recent Labs Lab 10/01/13 2023  AST 43*  ALT 36  ALKPHOS 476*  BILITOT 0.4  PROT 8.0  ALBUMIN 3.7    Recent Labs Lab 10/01/13 2023  LIPASE 465*  AMYLASE 269*   No results found for this basename:  AMMONIA,  in the last 168 hours CBC:  Recent Labs Lab 10/01/13 2023  WBC 6.0  NEUTROABS 3.4  HGB 13.6  HCT 40.1  MCV 95.7  PLT 273   Cardiac Enzymes: No results found for this basename: CKTOTAL, CKMB, CKMBINDEX, TROPONINI,  in the last 168 hours BNP: No components found with this basename: POCBNP,  D-dimer: No components found with this basename: D-DIMER,  CBG: No results found for this basename: GLUCAP,  in the last 168 hours  Radiological Exams on Admission: No results found.    Assessment/Plan    Active Problems:   Cocaine abuse   Tobacco abuse   Pancreatitis, alcoholic, acute   Hypertension   ETOH abuse   Noncompliance   PLAN:  standard treatment for acute pancreatitis: N.p.o. and IV  fluid and pain medication; Alcohol withdrawal protocol including initiation of supplemental B. Vitamins; check minerals   attempt drug cessation counseling   nicotine replacement Parenteral antihypertensive   Other plans as per orders.  Code Status:  full code    Critical care time: 60 minutes.   Altie Savard Nocturnist [facility] Pager [phone]   10/02/2013, 2:08 AM

## 2013-10-03 DIAGNOSIS — Z9119 Patient's noncompliance with other medical treatment and regimen: Secondary | ICD-10-CM

## 2013-10-03 MED ORDER — HYDROCHLOROTHIAZIDE 25 MG PO TABS
25.0000 mg | ORAL_TABLET | Freq: Every day | ORAL | Status: DC
  Administered 2013-10-03: 25 mg via ORAL
  Filled 2013-10-03 (×2): qty 1

## 2013-10-03 MED ORDER — HYDROCODONE-ACETAMINOPHEN 5-325 MG PO TABS
1.0000 | ORAL_TABLET | Freq: Four times a day (QID) | ORAL | Status: DC | PRN
  Administered 2013-10-03 – 2013-10-04 (×3): 1 via ORAL
  Filled 2013-10-03 (×3): qty 1

## 2013-10-03 NOTE — Progress Notes (Signed)
[facility] PROGRESS NOTE  Paul [name] ZOX:[id number] DOB: 08-28-1966 DOA: 10/01/2013 PCP: No PCP Per Patient  Assessment/Plan: 1. Acute alcoholic pancreatitis with associated abdominal pain, hematemesis, nausea and vomiting: Slowly improving. Advance diet. Repeat lipase in the morning. 2. Alcohol intoxication: Appears resolved. 3. Polysubstance abuse: Alcohol, cocaine, cigarette smoker.  Monitor for withdrawal. CIWA. Recommended cessation. I discussed alcohol and cocaine will make this problem worse.  4. Hypertension: Likely rebound hypertension secondary to noncompliance secondary to vomiting. Control somewhat improved. Continue lisinopril at higher dose, hydralazine 4 times a day. Avoid beta blockers secondary to cocaine use. Add diuretic. Long-standing in nature, goal will be gradual reduction over the next several weeks. 5. Chronic pancreatitis   Advance to full liquids.  Repeat lipase in the morning  Discontinue Dilaudid  Monitor for withdrawal  Anticipate discharge 10/6  Pending studies:   None  Code Status: full code DVT prophylaxis: SCDs Family Communication: None present Disposition Plan: Home when improved  Brendia Sacks, MD  [facility]  Pager [phone] If 7PM-7AM, please contact night-coverage at [email or web address], password [facility] 10/03/2013, 12:26 PM  LOS: 2 days   Summary: 47 year old man with history of polysubstance abuse including cocaine, alcohol and tobacco. He did to the emergency department with abdominal pain. Admitted for acute pancreatitis.  Consultants:    Procedures:    HPI/Subjective: A little dizzy today. Tolerating liquids. Would like to advance diet. Some of abdominal pain. Overall better.  Objective: Filed Vitals:   10/03/13 0559 10/03/13 0601 10/03/13 0603 10/03/13 1118  BP: 182/116 175/119 148/111 195/137  Pulse: 91   86  Temp: 97.6 F (36.4 C)     TempSrc: Oral     Resp: 18     Height:      Weight:       SpO2: 99%   99%    Intake/Output Summary (Last 24 hours) at 10/03/13 1226 Last data filed at 10/03/13 1050  Gross per 24 hour  Intake    600 ml  Output   1100 ml  Net   -500 ml     Filed Weights   10/02/13 0044 10/02/13 0437 10/03/13 0500  Weight: 67.2 kg (148 lb 2.4 oz) 68.3 kg (150 lb 9.2 oz) 70.8 kg (156 lb 1.4 oz)    Exam:   Afebrile, remains hypertensive but improved blood pressures. Normal oxygenation.  Appears calm and comfortable.   Cardiovascular: Regular rate and rhythm. No murmur, rub, gallop.  Respiratory: Clear to auscultation bilaterally. No wheezes, rales, rhonchi. Normal respiratory effort.  Abdomen: Soft, nondistended, mild epigastric tenderness  Exam current 10/5  Data Reviewed:  Urine drug screen positive for cocaine  Urinalysis unremarkable  Scheduled Meds: . acetaminophen  650 mg Oral Once  . diphenhydrAMINE  25 mg Oral Once  . folic acid  1 mg Oral Daily  . hydrALAZINE  25 mg Oral QID  . levETIRAcetam  750 mg Oral Daily  . lisinopril  40 mg Oral Daily  . LORazepam  0-4 mg Intravenous Q6H   Followed by  . [START ON 10/04/2013] LORazepam  0-4 mg Intravenous Q12H  . multivitamin with minerals  1 tablet Oral Daily  . pantoprazole  40 mg Oral BID  . sodium chloride  3 mL Intravenous Q12H  . thiamine  100 mg Oral Daily   Continuous Infusions:    Principal Problem:   Pancreatitis, alcoholic, acute Active Problems:   Cocaine abuse   Tobacco abuse   Hypertension   ETOH abuse   Noncompliance  Time spent  15 minutes

## 2013-10-04 LAB — COMPREHENSIVE METABOLIC PANEL
ALT: 16 U/L (ref 0–53)
AST: 13 U/L (ref 0–37)
Albumin: 3.1 g/dL — ABNORMAL LOW (ref 3.5–5.2)
Calcium: 9.2 mg/dL (ref 8.4–10.5)
Creatinine, Ser: 0.75 mg/dL (ref 0.50–1.35)
GFR calc Af Amer: >90 mL/min (ref >90)
GFR calc non Af Amer: >90 mL/min (ref >90)
Potassium: 3.5 mEq/L (ref 3.5–5.1)
Sodium: 135 mEq/L (ref 135–145)
Total Protein: 7.2 g/dL (ref 6.0–8.3)

## 2013-10-04 LAB — LIPASE, BLOOD: Lipase: 54 U/L (ref 11–59)

## 2013-10-04 MED ORDER — ADULT MULTIVITAMIN W/MINERALS CH: 1.0000 | ORAL_TABLET | Freq: Every day | ORAL | Status: DC

## 2013-10-04 MED ORDER — FOLIC ACID 1 MG PO TABS: 1.0000 mg | ORAL_TABLET | Freq: Every day | ORAL | Status: DC

## 2013-10-04 MED ORDER — HYDRALAZINE HCL 25 MG PO TABS: 25.0000 mg | ORAL_TABLET | Freq: Four times a day (QID) | ORAL | Status: DC

## 2013-10-04 MED ORDER — THIAMINE HCL 100 MG PO TABS: 100.0000 mg | ORAL_TABLET | Freq: Every day | ORAL | Status: DC

## 2013-10-04 MED ORDER — LISINOPRIL 40 MG PO TABS: 40.0000 mg | ORAL_TABLET | Freq: Every day | ORAL | Status: DC

## 2013-10-04 MED ORDER — HYDROCHLOROTHIAZIDE 25 MG PO TABS: 25.0000 mg | ORAL_TABLET | Freq: Every day | ORAL | Status: DC

## 2013-10-04 NOTE — Clinical Social Work Note (Signed)
Pt referred due to lack of insurance. CSW notified Artist.  Derenda Fennel, [place] [phone]

## 2013-10-04 NOTE — Progress Notes (Signed)
Patient's brother Paul [name] called, asked brother to notify hospital if talk to patient to get discharge instructions and prescriptions. Researched room again, found IV catheter in trash.

## 2013-10-04 NOTE — Progress Notes (Signed)
UR chart review completed.  

## 2013-10-04 NOTE — Discharge Summary (Addendum)
Physician Discharge Summary  Paul [name] UJW:[id number] DOB: 04/21/1966 DOA: 10/01/2013  PCP: No PCP Per Patient [facility]  Admit date: 10/01/2013 Discharge date: 10/04/2013  Recommendations for Outpatient Follow-up:  1. Followup polysubstance abuse. Continue to encourage cessation. 2. Followup chronic pancreatitis secondary to substance abuse 3. Followup malignant hypertension, titrate medications as clinically indicated 4. Noncompliance--please note that although the patient was discharged 10/6, he removed his own IV catheter and left the hospital prior to receiving discharge instructions and prescriptions.  Discharge Diagnoses:  1. Acute alcoholic pancreatitis 2. Alcohol intoxication 3. Polysubstance abuse: Alcohol, cocaine, cigarette smoker 4. Malignant hypertension, asymptomatic 5. Chronic pancreatitis  Discharge Condition: Improved Disposition: Home  Diet recommendation: Heart healthy  Filed Weights   10/02/13 0437 10/03/13 0500 10/04/13 0521  Weight: 68.3 kg (150 lb 9.2 oz) 70.8 kg (156 lb 1.4 oz) 70.6 kg (155 lb 10.3 oz)    History of present illness:  47 year old man with history of polysubstance abuse including cocaine, alcohol and tobacco. He presented to the emergency department with abdominal pain. Admitted for acute pancreatitis.  Hospital Course:  Paul [name] was treated for recurrent alcoholic pancreatitis with supportive care and standard treatment. He rapidly improved and at this point lipase has returned to normal, he is tolerating a diet and is stable for discharge. He was counseled on the connection between his substance abuse and pancreatitis and cessation was recommended, he displayed no interest. Also noted to have malignant hypertension which is long-standing and complicated by both substance abuse and noncompliance. He remained asymptomatic, medications have been adjusted. This issue is long-standing in nature and goal would be gradual  reduction over the next several weeks. Fortunately head he has no signs or symptoms of end organ compromise at this point.  1. Malignant Hypertension: Asymptomatic. Control overall improved. Likely complicated by rebound hypertension secondary to noncompliance and substance abuse. Continue lisinopril at higher dose, hydralazine 4 times a day. Avoid beta blockers secondary to cocaine use. Add diuretic. Long-standing in nature, goal will be gradual reduction over the next several weeks. 2. Chronic pancreatitis: Stable.  Consultants:  None Procedures:  None  Discharge Instructions  Discharge Orders   Future Orders Complete By Expires   Activity as tolerated - No restrictions  As directed    Diet - low sodium heart healthy  As directed    Discharge instructions  As directed    Comments:     It is critical to stop alcohol consumption, cocaine use and smoking. Continuing to use these substances will cause recurrent pancreatitis, worsen your blood pressure and can cause severe illness and death. Your blood pressure medications have been adjusted to improve control and you have been started on additional medication called hydrochlorothiazide for your blood pressure. Please followup with your primary care provider in one to 2 weeks. Call your physician or seek and mania medical attention for recurrent pain, vomiting or worsening of your condition.       Medication List         folic acid 1 MG tablet  Commonly known as:  FOLVITE  Take 1 tablet (1 mg total) by mouth daily.     hydrALAZINE 25 MG tablet  Commonly known as:  APRESOLINE  Take 1 tablet (25 mg total) by mouth 4 (four) times daily.     hydrochlorothiazide 25 MG tablet  Commonly known as:  HYDRODIURIL  Take 1 tablet (25 mg total) by mouth daily.     levETIRAcetam 750 MG tablet  Commonly  known as:  KEPPRA  Take 750 mg by mouth daily.     lisinopril 40 MG tablet  Commonly known as:  PRINIVIL,ZESTRIL  Take 1 tablet (40 mg total)  by mouth daily.     multivitamin with minerals Tabs tablet  Take 1 tablet by mouth daily.     omeprazole 20 MG capsule  Commonly known as:  PRILOSEC  Take 1 capsule (20 mg total) by mouth daily. For treatment of inflammation of your stomach lining.     promethazine 25 MG tablet  Commonly known as:  PHENERGAN  Take 1 tablet (25 mg total) by mouth every 6 (six) hours as needed for nausea.     thiamine 100 MG tablet  Take 1 tablet (100 mg total) by mouth daily.       No Known Allergies  The results of significant diagnostics from this hospitalization (including imaging, microbiology, ancillary and laboratory) are listed below for reference.    Labs: Basic Metabolic Panel:  Recent Labs Lab 10/01/13 2023 10/02/13 0636 10/04/13 0535  NA 144 144 135  K 3.7 3.9 3.5  CL 101 106 98  CO2 30 25 26   GLUCOSE 103* 60* 117*  BUN 9 9 4*  CREATININE 0.98 0.81 0.75  CALCIUM 8.9 8.7 9.2  MG 2.2  --   --    Liver Function Tests:  Recent Labs Lab 10/01/13 2023 10/02/13 0636 10/04/13 0535  AST 43* 26 13  ALT 36 28 16  ALKPHOS 476* 411* 291*  BILITOT 0.4 0.7 0.8  PROT 8.0 7.3 7.2  ALBUMIN 3.7 3.3* 3.1*    Recent Labs Lab 10/01/13 2023 10/02/13 0636 10/04/13 0535  LIPASE 465* 143* 54  AMYLASE 269*  --   --    CBC:  Recent Labs Lab 10/01/13 2023 10/02/13 0636  WBC 6.0 5.9  NEUTROABS 3.4  --   HGB 13.6 13.7  HCT 40.1 40.1  MCV 95.7 95.5  PLT 273 227   Principal Problem:   Pancreatitis, alcoholic, acute Active Problems:   Cocaine abuse   Tobacco abuse   Hypertension   ETOH abuse   Noncompliance   Time coordinating discharge: 25 minutes  Signed:  Brendia Sacks, MD [facility] 10/04/2013, 10:13 AM

## 2013-10-04 NOTE — Progress Notes (Signed)
[facility] PROGRESS NOTE  RACHID PARHAM RUE:[id number] DOB: 29-May-1966 DOA: 10/01/2013 PCP: No PCP Per Patient  Assessment/Plan: 1. Acute alcoholic pancreatitis with associated abdominal pain, hematemesis, nausea and vomiting: Appears resolved. Lipase normal. Tolerating diet. 2. Alcohol intoxication: Resolved. 3. Polysubstance abuse: Alcohol, cocaine, cigarette smoker.  No evidence of alcohol withdrawal. Recommended cessation. I discussed alcohol and cocaine will make this problem worse. He did not appear to be interested in stopping. 4. Malignant Hypertension: Asymptomatic. Control overall improved. Likely complicated by rebound hypertension secondary to noncompliance and substance abuse. Continue lisinopril at higher dose, hydralazine 4 times a day. Avoid beta blockers secondary to cocaine use. Add diuretic. Long-standing in nature, goal will be gradual reduction over the next several weeks. 5. Chronic pancreatitis: Stable.   Home today with outpatient followup with the [facility]  Followup blood pressure as an outpatient. Continue lisinopril at higher dose, hydralazine increased to 4 times a day, hydrochlorothiazide added. Avoid beta blockers secondary to cocaine use. Add diuretic.   Brendia Sacks, MD  [facility]  Pager [phone] If 7PM-7AM, please contact night-coverage at [email or web address], password [email or web address] 10/04/2013, 10:00 AM  LOS: 3 days   Summary: 47 year old man with history of polysubstance abuse including cocaine, alcohol and tobacco. He did to the emergency department with abdominal pain. Admitted for acute pancreatitis.  Consultants:  None  Procedures:  None  HPI/Subjective: Overall better. Tolerating diet. Minimal abdominal pain. Feels ready to go home.  Objective: Filed Vitals:   10/04/13 0519 10/04/13 0520 10/04/13 0521 10/04/13 0914  BP: 187/108 180/120 156/107 192/114  Pulse: 96 104 131 108  Temp: 98 F (36.7 C)   98.2 F  (36.8 C)  TempSrc: Oral   Oral  Resp: 18     Height:      Weight:   70.6 kg (155 lb 10.3 oz)   SpO2: 99% 96% 93% 99%    Intake/Output Summary (Last 24 hours) at 10/04/13 1000 Last data filed at 10/03/13 2100  Gross per 24 hour  Intake    980 ml  Output   1100 ml  Net   -120 ml     Filed Weights   10/02/13 0437 10/03/13 0500 10/04/13 0521  Weight: 68.3 kg (150 lb 9.2 oz) 70.8 kg (156 lb 1.4 oz) 70.6 kg (155 lb 10.3 oz)    Exam:   Afebrile, remains hypertensive but overall improved blood pressures. Normal oxygenation.  Appears calm and comfortable.   Cardiovascular: Regular rate and rhythm. No murmur, rub, gallop.  Respiratory: Clear to auscultation bilaterally. No wheezes, rales, rhonchi. Normal respiratory effort.  Abdomen: Soft, nondistended, no abdominal pain.  Data Reviewed:  Complete metabolic panel unremarkable.  Lipase 143 >> 54  Scheduled Meds: . acetaminophen  650 mg Oral Once  . diphenhydrAMINE  25 mg Oral Once  . folic acid  1 mg Oral Daily  . hydrALAZINE  25 mg Oral QID  . hydrochlorothiazide  25 mg Oral Daily  . levETIRAcetam  750 mg Oral Daily  . lisinopril  40 mg Oral Daily  . LORazepam  0-4 mg Intravenous Q12H  . multivitamin with minerals  1 tablet Oral Daily  . pantoprazole  40 mg Oral BID  . sodium chloride  3 mL Intravenous Q12H  . thiamine  100 mg Oral Daily   Continuous Infusions:    Principal Problem:   Pancreatitis, alcoholic, acute Active Problems:   Cocaine abuse   Tobacco abuse   Hypertension   ETOH abuse   Noncompliance

## 2013-10-04 NOTE — Progress Notes (Signed)
Patient with orders to be discharge home. This nurse went to patient's room to assess patient, give patient morning medications, and to give patient discharge instructions and prescriptions. Observed patient not in room, asked Mardene Celeste, tech if she has seen patient or taken IV access out. Mardene Celeste stated, I have not taken IV out." This nurse did not take IV out. No IV catheter noted in room. Patient's house number called, went straight to voicemail.Peggye Fothergill, Director notified. Dr. Irene Limbo notified. Risk management notified. Security notified.

## 2013-10-05 LAB — TYPE AND SCREEN
ABO/RH(D): B POS
Antibody Screen: NEGATIVE
Unit division: 0
Unit division: 0

## 2013-10-10 ENCOUNTER — Encounter (HOSPITAL_COMMUNITY): Payer: Self-pay | Admitting: Emergency Medicine

## 2013-10-10 ENCOUNTER — Emergency Department (HOSPITAL_COMMUNITY)
Admission: EM | Admit: 2013-10-10 | Discharge: 2013-10-11 | Disposition: A | Discharge status: Home / Self Care | Payer: Self-pay | Attending: Emergency Medicine | Admitting: Emergency Medicine

## 2013-10-10 DIAGNOSIS — Z8709 Personal history of other diseases of the respiratory system: Secondary | ICD-10-CM | POA: Insufficient documentation

## 2013-10-10 DIAGNOSIS — Z91199 Patient's noncompliance with other medical treatment and regimen due to unspecified reason: Secondary | ICD-10-CM | POA: Insufficient documentation

## 2013-10-10 DIAGNOSIS — Z87442 Personal history of urinary calculi: Secondary | ICD-10-CM | POA: Insufficient documentation

## 2013-10-10 DIAGNOSIS — K859 Acute pancreatitis without necrosis or infection, unspecified: Secondary | ICD-10-CM | POA: Insufficient documentation

## 2013-10-10 DIAGNOSIS — G40909 Epilepsy, unspecified, not intractable, without status epilepticus: Secondary | ICD-10-CM | POA: Insufficient documentation

## 2013-10-10 DIAGNOSIS — I10 Essential (primary) hypertension: Secondary | ICD-10-CM | POA: Insufficient documentation

## 2013-10-10 DIAGNOSIS — Z79899 Other long term (current) drug therapy: Secondary | ICD-10-CM | POA: Insufficient documentation

## 2013-10-10 DIAGNOSIS — R1012 Left upper quadrant pain: Secondary | ICD-10-CM | POA: Insufficient documentation

## 2013-10-10 DIAGNOSIS — R1011 Right upper quadrant pain: Secondary | ICD-10-CM | POA: Insufficient documentation

## 2013-10-10 DIAGNOSIS — F17200 Nicotine dependence, unspecified, uncomplicated: Secondary | ICD-10-CM | POA: Insufficient documentation

## 2013-10-10 DIAGNOSIS — E785 Hyperlipidemia, unspecified: Secondary | ICD-10-CM | POA: Insufficient documentation

## 2013-10-10 DIAGNOSIS — Z9119 Patient's noncompliance with other medical treatment and regimen: Secondary | ICD-10-CM | POA: Insufficient documentation

## 2013-10-10 DIAGNOSIS — Z87828 Personal history of other (healed) physical injury and trauma: Secondary | ICD-10-CM | POA: Insufficient documentation

## 2013-10-10 LAB — CBC WITH DIFFERENTIAL/PLATELET
Basophils Absolute: 0 10*3/uL (ref 0.0–0.1)
Basophils Relative: 0 % (ref 0–1)
Eosinophils Absolute: 0 10*3/uL (ref 0.0–0.7)
Eosinophils Relative: 0 % (ref 0–5)
HCT: 40.3 % (ref 39.0–52.0)
Hemoglobin: 14.1 g/dL (ref 13.0–17.0)
MCH: 32.9 pg (ref 26.0–34.0)
MCHC: 35 g/dL (ref 30.0–36.0)
MCV: 94.2 fL (ref 78.0–100.0)
Monocytes Absolute: 0.5 10*3/uL (ref 0.1–1.0)
Monocytes Relative: 7 % (ref 3–12)
Neutro Abs: 5.6 10*3/uL (ref 1.7–7.7)
RDW: 14.3 % (ref 11.5–15.5)

## 2013-10-10 LAB — COMPREHENSIVE METABOLIC PANEL
AST: 109 U/L — ABNORMAL HIGH (ref 0–37)
Albumin: 3.6 g/dL (ref 3.5–5.2)
Alkaline Phosphatase: 323 U/L — ABNORMAL HIGH (ref 39–117)
BUN: 7 mg/dL (ref 6–23)
Calcium: 9.6 mg/dL (ref 8.4–10.5)
Chloride: 98 mEq/L (ref 96–112)
Creatinine, Ser: 0.84 mg/dL (ref 0.50–1.35)
Total Bilirubin: 0.6 mg/dL (ref 0.3–1.2)

## 2013-10-10 LAB — LIPASE, BLOOD: Lipase: 170 U/L — ABNORMAL HIGH (ref 11–59)

## 2013-10-10 MED ORDER — LISINOPRIL 10 MG PO TABS
40.0000 mg | ORAL_TABLET | Freq: Once | ORAL | Status: AC
  Administered 2013-10-10: 40 mg via ORAL
  Filled 2013-10-10: qty 4

## 2013-10-10 MED ORDER — HYDRALAZINE HCL 20 MG/ML IJ SOLN
INTRAMUSCULAR | Status: AC
  Filled 2013-10-10: qty 1

## 2013-10-10 MED ORDER — OXYCODONE-ACETAMINOPHEN 5-325 MG PO TABS
2.0000 | ORAL_TABLET | Freq: Once | ORAL | Status: AC
  Administered 2013-10-10: 2 via ORAL
  Filled 2013-10-10: qty 2

## 2013-10-10 MED ORDER — ONDANSETRON HCL 4 MG/2ML IJ SOLN
4.0000 mg | Freq: Once | INTRAMUSCULAR | Status: AC
  Administered 2013-10-10: 4 mg via INTRAVENOUS
  Filled 2013-10-10: qty 2

## 2013-10-10 MED ORDER — HYDROMORPHONE HCL PF 1 MG/ML IJ SOLN
1.0000 mg | Freq: Once | INTRAMUSCULAR | Status: AC
  Administered 2013-10-10: 1 mg via INTRAVENOUS
  Filled 2013-10-10: qty 1

## 2013-10-10 MED ORDER — SODIUM CHLORIDE 0.9 % IV BOLUS (SEPSIS)
1000.0000 mL | Freq: Once | INTRAVENOUS | Status: AC
  Administered 2013-10-10: 1000 mL via INTRAVENOUS

## 2013-10-10 MED ORDER — HYDRALAZINE HCL 20 MG/ML IJ SOLN
25.0000 mg | Freq: Once | INTRAMUSCULAR | Status: AC
  Administered 2013-10-10: 25 mg via INTRAVENOUS
  Filled 2013-10-10: qty 2

## 2013-10-10 NOTE — ED Provider Notes (Signed)
CSN: [id number]     Arrival date & time 10/10/13  1649 History   First MD Initiated Contact with Patient 10/10/13 1708     Chief Complaint  Patient presents with  . Abdominal Pain   (Consider location/radiation/quality/duration/timing/severity/associated sxs/prior Treatment) HPI Comments: 47 year old male who admits to being an alcoholic, also does do cocaine use last used 48 hours ago. He presents because of mid epigastric pain that started approximately 3 hours ago, has been persistent, severe and is associated with multiple episodes of vomiting. He endorses heavy alcohol use this morning drinking a full ball of wine prior to the onset of his symptoms. He denies any changes in his stools, denies any back pain, denies any headache. Is similar to prior symptoms, review of the medical record shows that the patient was recently admitted to the hospital because of significant pancreatitis and persistent nausea and vomiting. When asked if he thinks he needs help with his drinking with detox he states "Doc I think I need to start thinking about it".  Patient is a 47 y.o. male presenting with abdominal pain. The history is provided by the patient.  Abdominal Pain   Past Medical History  Diagnosis Date  . Pancreatitis, acute     First episode earlier in 2012, hospitalized again in 02/2012 and 09/2012  . Hypertension     noncompliance  . Hyperlipidemia     noncompliance  . Lung collapse     h/o  . ETOH abuse   . Cocaine abuse   . DTs (delirium tremens)     history of  . Tobacco abuse   . Seizure disorder 07/17/2013  . Noncompliance 07/17/2013  . Traumatic subdural hematoma 06/15/2013  . 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 Open skull fracture 06/15/2013  . Polysubstance abuse     etoh, cocaine  . Nephrolithiasis   . Seizures    Past Surgical History  Procedure Laterality Date  . Mandible fracture surgery  2006  . Left axillary      surgery for deep laceration   Family  History  Problem Relation Age of Onset  . Diabetes type II Mother   . Liver disease Neg Hx   . Colon cancer Neg Hx   . GI problems Neg Hx    History  Substance Use Topics  . Smoking status: Current Every Day Smoker -- 1.00 packs/day    Types: Cigarettes  . Smokeless tobacco: Not on file  . Alcohol Use: Yes     Comment: Three 40oz per day/wine also    Review of Systems  Gastrointestinal: Positive for abdominal pain.  All other systems reviewed and are negative.    Allergies  Review of patient's allergies indicates no known allergies.  Home Medications   Current Outpatient Rx  Name  Route  Sig  Dispense  Refill  . folic acid (FOLVITE) 1 MG tablet   Oral   Take 1 tablet (1 mg total) by mouth daily.         . hydrALAZINE (APRESOLINE) 25 MG tablet   Oral   Take 1 tablet (25 mg total) by mouth 4 (four) times daily.   120 tablet   0   . hydrochlorothiazide (HYDRODIURIL) 25 MG tablet   Oral   Take 1 tablet (25 mg total) by mouth daily.   30 tablet   0   . levETIRAcetam (KEPPRA) 750 MG tablet   Oral   Take 750 mg by mouth daily.          Marland Kitchen  lisinopril (PRINIVIL,ZESTRIL) 40 MG tablet   Oral   Take 1 tablet (40 mg total) by mouth daily.   30 tablet   0   . Multiple Vitamin (MULTIVITAMIN WITH MINERALS) TABS tablet   Oral   Take 1 tablet by mouth daily.         Marland Kitchen omeprazole (PRILOSEC) 20 MG capsule   Oral   Take 1 capsule (20 mg total) by mouth daily. For treatment of inflammation of your stomach lining.   30 capsule   3   . promethazine (PHENERGAN) 25 MG tablet   Oral   Take 1 tablet (25 mg total) by mouth every 6 (six) hours as needed for nausea.   20 tablet   0   . thiamine 100 MG tablet   Oral   Take 1 tablet (100 mg total) by mouth daily.          BP 165/104  Pulse 118  Temp(Src) 97.7 F (36.5 C) (Oral)  Resp 20  Ht 6' (1.829 m)  Wt 155 lb (70.308 kg)  BMI 21.02 kg/m2  SpO2 100% Physical Exam  Nursing note and vitals  reviewed. Constitutional: He appears well-developed and well-nourished. No distress.  HENT:  Head: Normocephalic and atraumatic.  Mouth/Throat: Oropharynx is clear and moist. No oropharyngeal exudate.  Eyes: Conjunctivae and EOM are normal. Pupils are equal, round, and reactive to light. Right eye exhibits no discharge. Left eye exhibits no discharge. No scleral icterus.  Neck: Normal range of motion. Neck supple. No JVD present. No thyromegaly present.  Cardiovascular: Normal rate, regular rhythm, normal heart sounds and intact distal pulses.  Exam reveals no gallop and no friction rub.   No murmur heard. Pulmonary/Chest: Effort normal and breath sounds normal. No respiratory distress. He has no wheezes. He has no rales.  Abdominal: Soft. Bowel sounds are normal. He exhibits no distension and no mass. There is tenderness ( Tenderness with guarding in the epigastrium, right upper and left upper quadrant. No pain in the lower abdomen, no peritoneal signs). There is no rebound.  Musculoskeletal: Normal range of motion. He exhibits no edema and no tenderness.  Lymphadenopathy:    He has no cervical adenopathy.  Neurological: He is alert. Coordination normal.  Skin: Skin is warm and dry. No rash noted. No erythema.  Psychiatric: He has a normal mood and affect. His behavior is normal.    ED Course  Procedures (including critical care time) Labs Review Labs Reviewed  LIPASE, BLOOD - Abnormal; Notable for the following:    Lipase 170 (*)    All other components within normal limits  COMPREHENSIVE METABOLIC PANEL - Abnormal; Notable for the following:    Glucose, Bld 119 (*)    AST 109 (*)    Alkaline Phosphatase 323 (*)    All other components within normal limits  CBC WITH DIFFERENTIAL   Imaging Review No results found.  EKG Interpretation   None       MDM   1. Pancreatitis   2. Hypertension    The patient is borderline tachycardic, severely hypertensive which I believe at  this time is related to the vomiting and the pain. We'll give IV fluids, pain medication, check electrolytes and labs to evaluate for the severity of the pancreatitis. I have already cautioned and counseled the patient multiple times in the first 30 minutes of his visit about his excessive alcohol use and that this is somewhat of a self-fulfilling prophecy.  The pt has a slightly elevated  lipase - his WBC is normal, his BP last checked was 160's / 108 which is much improved.  He has no neurrological symptoms.  He has been given 2 percocet as well as parenteral medications and more fluids as he has ongoing tachycardia.  Pt improved with BP meds and pain meds but sx persisted.  Ongoing tachycardia, signed at change of shift - care assumed by Dr. Dierdre Highman.    Vida Roller, MD 10/13/13 2213

## 2013-10-10 NOTE — ED Notes (Signed)
Requesting more pain medication.

## 2013-10-10 NOTE — ED Notes (Signed)
Dr Hyacinth Meeker aware of pt request for more medication. Order received for Dilaudid 1 mg every 4 hours. Pt made aware of this

## 2013-10-10 NOTE — ED Notes (Signed)
Pt c/o sudden pain across upper abd x 3 hours. Pain worse with movement and palpation. lnbm yesterday, no black or bloody stools. N/v x 3. Denies diarrhea.

## 2014-03-03 ENCOUNTER — Encounter (HOSPITAL_COMMUNITY): Payer: Self-pay | Admitting: Emergency Medicine

## 2014-03-03 ENCOUNTER — Emergency Department (HOSPITAL_COMMUNITY)
Admission: EM | Admit: 2014-03-03 | Discharge: 2014-03-03 | Disposition: A | Discharge status: Home / Self Care | Payer: Self-pay | Attending: Emergency Medicine | Admitting: Emergency Medicine

## 2014-03-03 DIAGNOSIS — F17200 Nicotine dependence, unspecified, uncomplicated: Secondary | ICD-10-CM | POA: Insufficient documentation

## 2014-03-03 DIAGNOSIS — Z8639 Personal history of other endocrine, nutritional and metabolic disease: Secondary | ICD-10-CM | POA: Insufficient documentation

## 2014-03-03 DIAGNOSIS — W503XXA Accidental bite by another person, initial encounter: Secondary | ICD-10-CM | POA: Insufficient documentation

## 2014-03-03 DIAGNOSIS — I10 Essential (primary) hypertension: Secondary | ICD-10-CM | POA: Insufficient documentation

## 2014-03-03 DIAGNOSIS — Y9389 Activity, other specified: Secondary | ICD-10-CM | POA: Insufficient documentation

## 2014-03-03 DIAGNOSIS — Z87828 Personal history of other (healed) physical injury and trauma: Secondary | ICD-10-CM | POA: Insufficient documentation

## 2014-03-03 DIAGNOSIS — Z862 Personal history of diseases of the blood and blood-forming organs and certain disorders involving the immune mechanism: Secondary | ICD-10-CM | POA: Insufficient documentation

## 2014-03-03 DIAGNOSIS — S01512A Laceration without foreign body of oral cavity, initial encounter: Secondary | ICD-10-CM

## 2014-03-03 DIAGNOSIS — Z79899 Other long term (current) drug therapy: Secondary | ICD-10-CM | POA: Insufficient documentation

## 2014-03-03 DIAGNOSIS — Y9289 Other specified places as the place of occurrence of the external cause: Secondary | ICD-10-CM | POA: Insufficient documentation

## 2014-03-03 DIAGNOSIS — Z87442 Personal history of urinary calculi: Secondary | ICD-10-CM | POA: Insufficient documentation

## 2014-03-03 DIAGNOSIS — G40909 Epilepsy, unspecified, not intractable, without status epilepticus: Secondary | ICD-10-CM | POA: Insufficient documentation

## 2014-03-03 DIAGNOSIS — Z8781 Personal history of (healed) traumatic fracture: Secondary | ICD-10-CM | POA: Insufficient documentation

## 2014-03-03 DIAGNOSIS — Z91199 Patient's noncompliance with other medical treatment and regimen due to unspecified reason: Secondary | ICD-10-CM | POA: Insufficient documentation

## 2014-03-03 DIAGNOSIS — Z9119 Patient's noncompliance with other medical treatment and regimen: Secondary | ICD-10-CM | POA: Insufficient documentation

## 2014-03-03 DIAGNOSIS — S01502A Unspecified open wound of oral cavity, initial encounter: Secondary | ICD-10-CM | POA: Insufficient documentation

## 2014-03-03 DIAGNOSIS — Z8709 Personal history of other diseases of the respiratory system: Secondary | ICD-10-CM | POA: Insufficient documentation

## 2014-03-03 DIAGNOSIS — Z8719 Personal history of other diseases of the digestive system: Secondary | ICD-10-CM | POA: Insufficient documentation

## 2014-03-03 DIAGNOSIS — Z8659 Personal history of other mental and behavioral disorders: Secondary | ICD-10-CM | POA: Insufficient documentation

## 2014-03-03 LAB — RAPID URINE DRUG SCREEN, HOSP PERFORMED
Amphetamines: NOT DETECTED
BENZODIAZEPINES: NOT DETECTED
Barbiturates: NOT DETECTED
Cocaine: POSITIVE — AB
Opiates: NOT DETECTED
Tetrahydrocannabinol: NOT DETECTED

## 2014-03-03 LAB — CBC WITH DIFFERENTIAL/PLATELET
BASOS ABS: 0 10*3/uL (ref 0.0–0.1)
Basophils Relative: 0 % (ref 0–1)
EOS ABS: 0.1 10*3/uL (ref 0.0–0.7)
Eosinophils Relative: 1 % (ref 0–5)
HEMATOCRIT: 48.5 % (ref 39.0–52.0)
Hemoglobin: 16.9 g/dL (ref 13.0–17.0)
Lymphocytes Relative: 35 % (ref 12–46)
Lymphs Abs: 2.1 10*3/uL (ref 0.7–4.0)
MCH: 33.4 pg (ref 26.0–34.0)
MCHC: 34.8 g/dL (ref 30.0–36.0)
MCV: 95.8 fL (ref 78.0–100.0)
MONO ABS: 0.7 10*3/uL (ref 0.1–1.0)
Monocytes Relative: 11 % (ref 3–12)
NEUTROS ABS: 3.3 10*3/uL (ref 1.7–7.7)
Neutrophils Relative %: 53 % (ref 43–77)
Platelets: 215 10*3/uL (ref 150–400)
RBC: 5.06 MIL/uL (ref 4.22–5.81)
RDW: 14 % (ref 11.5–15.5)
WBC: 6.1 10*3/uL (ref 4.0–10.5)

## 2014-03-03 LAB — BASIC METABOLIC PANEL
BUN: 17 mg/dL (ref 6–23)
CALCIUM: 10 mg/dL (ref 8.4–10.5)
CHLORIDE: 96 meq/L (ref 96–112)
CO2: 34 mEq/L — ABNORMAL HIGH (ref 19–32)
CREATININE: 1.73 mg/dL — AB (ref 0.50–1.35)
GFR, EST AFRICAN AMERICAN: 52 mL/min — AB (ref >90)
GFR, EST NON AFRICAN AMERICAN: 45 mL/min — AB (ref >90)
Glucose, Bld: 124 mg/dL — ABNORMAL HIGH (ref 70–99)
Potassium: 4.7 mEq/L (ref 3.7–5.3)
Sodium: 142 mEq/L (ref 137–147)

## 2014-03-03 LAB — ETHANOL: Alcohol, Ethyl (B): <11 mg/dL (ref 0–11)

## 2014-03-03 MED ORDER — PHENYTOIN SODIUM EXTENDED 100 MG PO CAPS: 100.0000 mg | ORAL_CAPSULE | Freq: Three times a day (TID) | ORAL | Status: DC

## 2014-03-03 MED ORDER — LORAZEPAM 1 MG PO TABS
1.0000 mg | ORAL_TABLET | Freq: Once | ORAL | Status: AC
  Administered 2014-03-03: 1 mg via ORAL
  Filled 2014-03-03: qty 1

## 2014-03-03 NOTE — Discharge Instructions (Signed)
No suturing necessary for your tongue. Avoid spicy or acid foods.  Prescription for Dilantin given. Followup your normal primary care relationship

## 2014-03-03 NOTE — ED Notes (Signed)
Pt reports two nights ago when he went to bed he felt "funny". States he woke up with cuts all in his mouth. Hx of seizures, ran out of medication 1 month ago. Unsure what he was on.  Does not have family doctor, states medication prescribed by ED. Pain to mouth.

## 2014-03-03 NOTE — ED Notes (Signed)
Pt states unable to obtain urine specimen. Given water to drink.
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
## 2014-03-03 NOTE — ED Notes (Signed)
Seizure pads placed on bed. Pt placed on monitor.

## 2014-03-07 ENCOUNTER — Emergency Department (HOSPITAL_COMMUNITY)
Admission: EM | Admit: 2014-03-07 | Discharge: 2014-03-07 | Disposition: A | Discharge status: Home / Self Care | Payer: MEDICAID

## 2014-03-08 ENCOUNTER — Emergency Department (HOSPITAL_COMMUNITY)
Admission: EM | Admit: 2014-03-08 | Discharge: 2014-03-08 | Disposition: A | Discharge status: Home / Self Care | Payer: Self-pay | Attending: Emergency Medicine | Admitting: Emergency Medicine

## 2014-03-08 ENCOUNTER — Encounter (HOSPITAL_COMMUNITY): Payer: Self-pay | Admitting: Emergency Medicine

## 2014-03-08 DIAGNOSIS — G40909 Epilepsy, unspecified, not intractable, without status epilepticus: Secondary | ICD-10-CM | POA: Insufficient documentation

## 2014-03-08 DIAGNOSIS — I10 Essential (primary) hypertension: Secondary | ICD-10-CM | POA: Insufficient documentation

## 2014-03-08 DIAGNOSIS — Z8719 Personal history of other diseases of the digestive system: Secondary | ICD-10-CM | POA: Insufficient documentation

## 2014-03-08 DIAGNOSIS — Z8659 Personal history of other mental and behavioral disorders: Secondary | ICD-10-CM | POA: Insufficient documentation

## 2014-03-08 DIAGNOSIS — Y9389 Activity, other specified: Secondary | ICD-10-CM | POA: Insufficient documentation

## 2014-03-08 DIAGNOSIS — Z8781 Personal history of (healed) traumatic fracture: Secondary | ICD-10-CM | POA: Insufficient documentation

## 2014-03-08 DIAGNOSIS — S01512A Laceration without foreign body of oral cavity, initial encounter: Secondary | ICD-10-CM

## 2014-03-08 DIAGNOSIS — Z87442 Personal history of urinary calculi: Secondary | ICD-10-CM | POA: Insufficient documentation

## 2014-03-08 DIAGNOSIS — Z862 Personal history of diseases of the blood and blood-forming organs and certain disorders involving the immune mechanism: Secondary | ICD-10-CM | POA: Insufficient documentation

## 2014-03-08 DIAGNOSIS — Z79899 Other long term (current) drug therapy: Secondary | ICD-10-CM | POA: Insufficient documentation

## 2014-03-08 DIAGNOSIS — W503XXA Accidental bite by another person, initial encounter: Secondary | ICD-10-CM | POA: Insufficient documentation

## 2014-03-08 DIAGNOSIS — Y929 Unspecified place or not applicable: Secondary | ICD-10-CM | POA: Insufficient documentation

## 2014-03-08 DIAGNOSIS — F17200 Nicotine dependence, unspecified, uncomplicated: Secondary | ICD-10-CM | POA: Insufficient documentation

## 2014-03-08 DIAGNOSIS — Z8639 Personal history of other endocrine, nutritional and metabolic disease: Secondary | ICD-10-CM | POA: Insufficient documentation

## 2014-03-08 DIAGNOSIS — S01502A Unspecified open wound of oral cavity, initial encounter: Secondary | ICD-10-CM | POA: Insufficient documentation

## 2014-03-08 MED ORDER — HYDROCODONE-ACETAMINOPHEN 5-325 MG PO TABS
1.0000 | ORAL_TABLET | Freq: Once | ORAL | Status: AC
  Administered 2014-03-08: 1 via ORAL
  Filled 2014-03-08: qty 1

## 2014-03-08 MED ORDER — HYDROCODONE-ACETAMINOPHEN 5-325 MG PO TABS: 1.0000 | ORAL_TABLET | Freq: Four times a day (QID) | ORAL | Status: DC | PRN

## 2014-03-08 NOTE — ED Notes (Signed)
Pt states that he had a seizure on 03/03/2014 that caused him to bite the right side of his tongue, was seen in er at that time for seizure, but still continues to have pain to tongue area, pt alert able to answer questions, speech clear, denies any problems with seizures since 03/06/2014, states "I was given medication for the seizures", also reports that he his out of medication for his blood pressure and some other medications for "awhile" but is not sure of the names,

## 2014-03-08 NOTE — Discharge Instructions (Signed)
Follow up as needed
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
## 2014-05-17 ENCOUNTER — Emergency Department (HOSPITAL_COMMUNITY): Payer: Self-pay

## 2014-05-17 ENCOUNTER — Inpatient Hospital Stay (HOSPITAL_COMMUNITY): Payer: Self-pay

## 2014-05-17 ENCOUNTER — Inpatient Hospital Stay (HOSPITAL_COMMUNITY)
Admission: EM | Admit: 2014-05-17 | Discharge: 2014-05-20 | DRG: 086 | Disposition: A | Discharge status: Home / Self Care | Payer: Self-pay | Attending: Surgery | Admitting: Surgery

## 2014-05-17 ENCOUNTER — Encounter (HOSPITAL_COMMUNITY): Payer: Self-pay | Admitting: Emergency Medicine

## 2014-05-17 DIAGNOSIS — I609 Nontraumatic subarachnoid hemorrhage, unspecified: Secondary | ICD-10-CM

## 2014-05-17 DIAGNOSIS — S2249XA Multiple fractures of ribs, unspecified side, initial encounter for closed fracture: Secondary | ICD-10-CM

## 2014-05-17 DIAGNOSIS — I10 Essential (primary) hypertension: Secondary | ICD-10-CM | POA: Diagnosis present

## 2014-05-17 DIAGNOSIS — S069X9A Unspecified intracranial injury with loss of consciousness of unspecified duration, initial encounter: Secondary | ICD-10-CM | POA: Diagnosis present

## 2014-05-17 DIAGNOSIS — S2232XA Fracture of one rib, left side, initial encounter for closed fracture: Secondary | ICD-10-CM

## 2014-05-17 DIAGNOSIS — E785 Hyperlipidemia, unspecified: Secondary | ICD-10-CM | POA: Diagnosis present

## 2014-05-17 DIAGNOSIS — S069XAA Unspecified intracranial injury with loss of consciousness status unknown, initial encounter: Secondary | ICD-10-CM

## 2014-05-17 DIAGNOSIS — S065XAA Traumatic subdural hemorrhage with loss of consciousness status unknown, initial encounter: Secondary | ICD-10-CM

## 2014-05-17 DIAGNOSIS — S270XXA Traumatic pneumothorax, initial encounter: Secondary | ICD-10-CM

## 2014-05-17 DIAGNOSIS — F17200 Nicotine dependence, unspecified, uncomplicated: Secondary | ICD-10-CM | POA: Diagnosis present

## 2014-05-17 DIAGNOSIS — S066X9A Traumatic subarachnoid hemorrhage with loss of consciousness of unspecified duration, initial encounter: Secondary | ICD-10-CM

## 2014-05-17 DIAGNOSIS — S065X9A Traumatic subdural hemorrhage with loss of consciousness of unspecified duration, initial encounter: Secondary | ICD-10-CM

## 2014-05-17 DIAGNOSIS — J939 Pneumothorax, unspecified: Secondary | ICD-10-CM

## 2014-05-17 DIAGNOSIS — S2242XA Multiple fractures of ribs, left side, initial encounter for closed fracture: Secondary | ICD-10-CM | POA: Diagnosis present

## 2014-05-17 DIAGNOSIS — S066XAA Traumatic subarachnoid hemorrhage with loss of consciousness status unknown, initial encounter: Secondary | ICD-10-CM

## 2014-05-17 DIAGNOSIS — J9383 Other pneumothorax: Secondary | ICD-10-CM | POA: Diagnosis present

## 2014-05-17 DIAGNOSIS — S066X0A Traumatic subarachnoid hemorrhage without loss of consciousness, initial encounter: Principal | ICD-10-CM | POA: Diagnosis present

## 2014-05-17 LAB — CBC WITH DIFFERENTIAL/PLATELET
Basophils Absolute: 0 10*3/uL (ref 0.0–0.1)
Basophils Relative: 0 % (ref 0–1)
Eosinophils Absolute: 0 10*3/uL (ref 0.0–0.7)
Eosinophils Relative: 1 % (ref 0–5)
HEMATOCRIT: 39.4 % (ref 39.0–52.0)
HEMOGLOBIN: 13.6 g/dL (ref 13.0–17.0)
LYMPHS PCT: 21 % (ref 12–46)
Lymphs Abs: 1.6 10*3/uL (ref 0.7–4.0)
MCH: 33.8 pg (ref 26.0–34.0)
MCHC: 34.5 g/dL (ref 30.0–36.0)
MCV: 98 fL (ref 78.0–100.0)
MONO ABS: 0.4 10*3/uL (ref 0.1–1.0)
MONOS PCT: 5 % (ref 3–12)
Neutro Abs: 5.6 10*3/uL (ref 1.7–7.7)
Neutrophils Relative %: 73 % (ref 43–77)
Platelets: 228 10*3/uL (ref 150–400)
RBC: 4.02 MIL/uL — ABNORMAL LOW (ref 4.22–5.81)
RDW: 13.9 % (ref 11.5–15.5)
WBC: 7.7 10*3/uL (ref 4.0–10.5)

## 2014-05-17 LAB — COMPREHENSIVE METABOLIC PANEL
ALT: 17 U/L (ref 0–53)
AST: 40 U/L — ABNORMAL HIGH (ref 0–37)
Albumin: 3.4 g/dL — ABNORMAL LOW (ref 3.5–5.2)
Alkaline Phosphatase: 220 U/L — ABNORMAL HIGH (ref 39–117)
BILIRUBIN TOTAL: 0.5 mg/dL (ref 0.3–1.2)
BUN: 18 mg/dL (ref 6–23)
CHLORIDE: 102 meq/L (ref 96–112)
CO2: 20 meq/L (ref 19–32)
CREATININE: 1.29 mg/dL (ref 0.50–1.35)
Calcium: 8.8 mg/dL (ref 8.4–10.5)
GFR calc Af Amer: 75 mL/min — ABNORMAL LOW (ref >90)
GFR, EST NON AFRICAN AMERICAN: 65 mL/min — AB (ref >90)
GLUCOSE: 136 mg/dL — AB (ref 70–99)
Potassium: 3.8 mEq/L (ref 3.7–5.3)
Sodium: 138 mEq/L (ref 137–147)
Total Protein: 7.2 g/dL (ref 6.0–8.3)

## 2014-05-17 LAB — RAPID URINE DRUG SCREEN, HOSP PERFORMED
Amphetamines: NOT DETECTED
BARBITURATES: NOT DETECTED
Benzodiazepines: NOT DETECTED
COCAINE: POSITIVE — AB
OPIATES: NOT DETECTED
Tetrahydrocannabinol: NOT DETECTED

## 2014-05-17 LAB — URINALYSIS, ROUTINE W REFLEX MICROSCOPIC
BILIRUBIN URINE: NEGATIVE
Glucose, UA: NEGATIVE mg/dL
Ketones, ur: NEGATIVE mg/dL
Leukocytes, UA: NEGATIVE
NITRITE: NEGATIVE
Specific Gravity, Urine: 1.025 (ref 1.005–1.030)
UROBILINOGEN UA: 0.2 mg/dL (ref 0.0–1.0)
pH: 6 (ref 5.0–8.0)

## 2014-05-17 LAB — I-STAT CHEM 8, ED
BUN: 18 mg/dL (ref 6–23)
Calcium, Ion: 1.11 mmol/L — ABNORMAL LOW (ref 1.12–1.23)
Chloride: 105 mEq/L (ref 96–112)
Creatinine, Ser: 1.5 mg/dL — ABNORMAL HIGH (ref 0.50–1.35)
Glucose, Bld: 131 mg/dL — ABNORMAL HIGH (ref 70–99)
HEMATOCRIT: 44 % (ref 39.0–52.0)
HEMOGLOBIN: 15 g/dL (ref 13.0–17.0)
Potassium: 3.4 mEq/L — ABNORMAL LOW (ref 3.7–5.3)
Sodium: 140 mEq/L (ref 137–147)
TCO2: 21 mmol/L (ref 0–100)

## 2014-05-17 LAB — ETHANOL: ALCOHOL ETHYL (B): 155 mg/dL — AB (ref 0–11)

## 2014-05-17 LAB — URINE MICROSCOPIC-ADD ON

## 2014-05-17 LAB — PHENYTOIN LEVEL, TOTAL: Phenytoin Lvl: <2.5 ug/mL — ABNORMAL LOW (ref 10.0–20.0)

## 2014-05-17 MED ORDER — FENTANYL CITRATE 0.05 MG/ML IJ SOLN
50.0000 ug | Freq: Once | INTRAMUSCULAR | Status: AC
  Administered 2014-05-17: 50 ug via INTRAVENOUS
  Filled 2014-05-17: qty 2

## 2014-05-17 MED ORDER — SODIUM CHLORIDE 0.9 % IV SOLN
1000.0000 mg | Freq: Once | INTRAVENOUS | Status: AC
  Administered 2014-05-17: 1000 mg via INTRAVENOUS
  Filled 2014-05-17: qty 20

## 2014-05-17 MED ORDER — ONDANSETRON HCL 4 MG/2ML IJ SOLN
4.0000 mg | Freq: Once | INTRAMUSCULAR | Status: AC
  Administered 2014-05-17: 4 mg via INTRAVENOUS
  Filled 2014-05-17: qty 2

## 2014-05-17 MED ORDER — IOHEXOL 300 MG/ML  SOLN
100.0000 mL | Freq: Once | INTRAMUSCULAR | Status: AC | PRN
  Administered 2014-05-17: 100 mL via INTRAVENOUS

## 2014-05-17 MED ORDER — PHENYTOIN SODIUM 50 MG/ML IJ SOLN
INTRAMUSCULAR | Status: AC
  Filled 2014-05-17: qty 20
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
## 2014-05-17 NOTE — ED Notes (Signed)
Patient requesting more pain medication. Dr. Lynelle DoctorKnapp aware and verbal order for 50 mcg Fentanyl IV obtained.

## 2014-05-17 NOTE — ED Notes (Signed)
Patient stating that his head ans stomach is hurting at this time. Requesting pain medication. Will make his RN Andy Gaussmanda K aware.
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
## 2014-05-17 NOTE — ED Notes (Signed)
Pt. Reports he feels like he is having difficulty breathing. Pt. O2 100% on room air respiratory rate 18. Dr. Lynelle DoctorKnapp Notified.
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
## 2014-05-17 NOTE — ED Notes (Signed)
EDP notified of blood pressure 170/119.

## 2014-05-18 ENCOUNTER — Inpatient Hospital Stay (HOSPITAL_COMMUNITY): Payer: Self-pay

## 2014-05-18 DIAGNOSIS — S069XAA Unspecified intracranial injury with loss of consciousness status unknown, initial encounter: Secondary | ICD-10-CM | POA: Diagnosis present

## 2014-05-18 DIAGNOSIS — S069X9A Unspecified intracranial injury with loss of consciousness of unspecified duration, initial encounter: Secondary | ICD-10-CM | POA: Diagnosis present

## 2014-05-18 LAB — CBC
HEMATOCRIT: 40.9 % (ref 39.0–52.0)
Hemoglobin: 13.7 g/dL (ref 13.0–17.0)
MCH: 33.3 pg (ref 26.0–34.0)
MCHC: 33.5 g/dL (ref 30.0–36.0)
MCV: 99.5 fL (ref 78.0–100.0)
PLATELETS: 184 10*3/uL (ref 150–400)
RBC: 4.11 MIL/uL — ABNORMAL LOW (ref 4.22–5.81)
RDW: 14.1 % (ref 11.5–15.5)
WBC: 5.2 10*3/uL (ref 4.0–10.5)

## 2014-05-18 LAB — BASIC METABOLIC PANEL
BUN: 12 mg/dL (ref 6–23)
CO2: 22 mEq/L (ref 19–32)
CREATININE: 0.89 mg/dL (ref 0.50–1.35)
Calcium: 8.9 mg/dL (ref 8.4–10.5)
Chloride: 103 mEq/L (ref 96–112)
Glucose, Bld: 114 mg/dL — ABNORMAL HIGH (ref 70–99)
Potassium: 4 mEq/L (ref 3.7–5.3)
Sodium: 139 mEq/L (ref 137–147)

## 2014-05-18 LAB — MRSA PCR SCREENING: MRSA BY PCR: NEGATIVE

## 2014-05-18 MED ORDER — THIAMINE HCL 100 MG/ML IJ SOLN
100.0000 mg | Freq: Every day | INTRAMUSCULAR | Status: DC
  Filled 2014-05-18: qty 1

## 2014-05-18 MED ORDER — ONDANSETRON HCL 4 MG/2ML IJ SOLN: 4.0000 mg | Freq: Three times a day (TID) | INTRAMUSCULAR | Status: AC | PRN

## 2014-05-18 MED ORDER — FOLIC ACID 1 MG PO TABS
1.0000 mg | ORAL_TABLET | Freq: Every day | ORAL | Status: DC
  Administered 2014-05-18 – 2014-05-20 (×3): 1 mg via ORAL
  Filled 2014-05-18 (×3): qty 1

## 2014-05-18 MED ORDER — PANTOPRAZOLE SODIUM 40 MG IV SOLR
40.0000 mg | Freq: Every day | INTRAVENOUS | Status: DC
  Administered 2014-05-18: 40 mg via INTRAVENOUS
  Filled 2014-05-18: qty 40

## 2014-05-18 MED ORDER — LORAZEPAM 2 MG/ML IJ SOLN: 1.0000 mg | Freq: Four times a day (QID) | INTRAMUSCULAR | Status: DC | PRN

## 2014-05-18 MED ORDER — VITAMIN B-1 100 MG PO TABS
100.0000 mg | ORAL_TABLET | Freq: Every day | ORAL | Status: DC
  Administered 2014-05-18 – 2014-05-20 (×3): 100 mg via ORAL
  Filled 2014-05-18 (×3): qty 1

## 2014-05-18 MED ORDER — ADULT MULTIVITAMIN W/MINERALS CH
1.0000 | ORAL_TABLET | Freq: Every day | ORAL | Status: DC
Start: 2014-05-18 — End: 2014-05-20
  Administered 2014-05-18 – 2014-05-20 (×3): 1 via ORAL
  Filled 2014-05-18 (×3): qty 1

## 2014-05-18 MED ORDER — NICOTINE 21 MG/24HR TD PT24
21.0000 mg | MEDICATED_PATCH | Freq: Every day | TRANSDERMAL | Status: DC
  Administered 2014-05-18 – 2014-05-20 (×3): 21 mg via TRANSDERMAL
  Filled 2014-05-18 (×3): qty 1

## 2014-05-18 MED ORDER — ACETAMINOPHEN 325 MG PO TABS: 650.0000 mg | ORAL_TABLET | ORAL | Status: DC | PRN

## 2014-05-18 MED ORDER — LORAZEPAM 2 MG/ML IJ SOLN: 0.0000 mg | Freq: Two times a day (BID) | INTRAMUSCULAR | Status: DC

## 2014-05-18 MED ORDER — LORAZEPAM 1 MG PO TABS
1.0000 mg | ORAL_TABLET | Freq: Four times a day (QID) | ORAL | Status: DC | PRN
  Administered 2014-05-19: 1 mg via ORAL
  Filled 2014-05-18: qty 1

## 2014-05-18 MED ORDER — ONDANSETRON HCL 4 MG/2ML IJ SOLN: 4.0000 mg | Freq: Four times a day (QID) | INTRAMUSCULAR | Status: DC | PRN

## 2014-05-18 MED ORDER — MORPHINE SULFATE 2 MG/ML IJ SOLN
2.0000 mg | INTRAMUSCULAR | Status: DC | PRN
Start: 2014-05-18 — End: 2014-05-19
  Administered 2014-05-18 (×3): 4 mg via INTRAVENOUS
  Filled 2014-05-18 (×4): qty 2

## 2014-05-18 MED ORDER — HYDRALAZINE HCL 20 MG/ML IJ SOLN
10.0000 mg | INTRAMUSCULAR | Status: DC | PRN
  Administered 2014-05-18 – 2014-05-19 (×3): 10 mg via INTRAVENOUS
  Filled 2014-05-18 (×3): qty 1

## 2014-05-18 MED ORDER — FENTANYL CITRATE 0.05 MG/ML IJ SOLN
50.0000 ug | INTRAMUSCULAR | Status: DC | PRN
Start: 2014-05-18 — End: 2014-05-18
  Administered 2014-05-18 (×2): 50 ug via INTRAVENOUS
  Filled 2014-05-18 (×2): qty 2

## 2014-05-18 MED ORDER — POTASSIUM CHLORIDE IN NACL 20-0.9 MEQ/L-% IV SOLN
INTRAVENOUS | Status: DC
  Administered 2014-05-18: 04:00:00 via INTRAVENOUS
  Filled 2014-05-18 (×2): qty 1000

## 2014-05-18 MED ORDER — LORAZEPAM 2 MG/ML IJ SOLN: 0.0000 mg | Freq: Four times a day (QID) | INTRAMUSCULAR | Status: AC

## 2014-05-18 MED ORDER — OXYCODONE HCL 5 MG PO TABS
10.0000 mg | ORAL_TABLET | ORAL | Status: DC | PRN
  Administered 2014-05-18 – 2014-05-19 (×7): 10 mg via ORAL
  Filled 2014-05-18 (×7): qty 2

## 2014-05-18 MED ORDER — PHENYTOIN SODIUM EXTENDED 100 MG PO CAPS
100.0000 mg | ORAL_CAPSULE | Freq: Three times a day (TID) | ORAL | Status: DC
  Administered 2014-05-18 – 2014-05-20 (×8): 100 mg via ORAL
  Filled 2014-05-18 (×9): qty 1

## 2014-05-18 MED ORDER — ONDANSETRON HCL 4 MG PO TABS: 4.0000 mg | ORAL_TABLET | Freq: Four times a day (QID) | ORAL | Status: DC | PRN

## 2014-05-18 MED ORDER — OXYCODONE HCL 5 MG PO TABS: 5.0000 mg | ORAL_TABLET | ORAL | Status: DC | PRN

## 2014-05-18 MED ORDER — PANTOPRAZOLE SODIUM 40 MG PO TBEC
40.0000 mg | DELAYED_RELEASE_TABLET | Freq: Every day | ORAL | Status: DC
  Administered 2014-05-19: 40 mg via ORAL
  Filled 2014-05-18: qty 1

## 2014-05-18 NOTE — Progress Notes (Signed)
UR completed.  Ajia Chadderdon, RN BSN [facility] CCM Trauma/Neuro ICU Case Manager [phone]  

## 2014-05-18 NOTE — Progress Notes (Signed)
Patient transferred with belongings, medications, and chart to room with nurse.  New nurse at bedside. No new problems at this time. Paul [name]
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
## 2014-05-19 ENCOUNTER — Inpatient Hospital Stay (HOSPITAL_COMMUNITY): Payer: Self-pay

## 2014-05-19 MED ORDER — OXYCODONE HCL 5 MG PO TABS
5.0000 mg | ORAL_TABLET | ORAL | Status: DC | PRN
Start: 2014-05-19 — End: 2014-05-20
  Administered 2014-05-19 – 2014-05-20 (×5): 15 mg via ORAL
  Filled 2014-05-19 (×5): qty 3

## 2014-05-19 MED ORDER — MORPHINE SULFATE 2 MG/ML IJ SOLN: 2.0000 mg | INTRAMUSCULAR | Status: DC | PRN

## 2014-05-19 MED ORDER — ENALAPRIL MALEATE 5 MG PO TABS
5.0000 mg | ORAL_TABLET | Freq: Every day | ORAL | Status: DC
  Administered 2014-05-19 – 2014-05-20 (×2): 5 mg via ORAL
  Filled 2014-05-19 (×2): qty 1

## 2014-05-19 MED ORDER — NAPROXEN 500 MG PO TABS
500.0000 mg | ORAL_TABLET | Freq: Two times a day (BID) | ORAL | Status: DC
  Administered 2014-05-19 – 2014-05-20 (×3): 500 mg via ORAL
  Filled 2014-05-19 (×4): qty 1

## 2014-05-19 MED ORDER — ENSURE COMPLETE PO LIQD
237.0000 mL | Freq: Two times a day (BID) | ORAL | Status: DC
  Administered 2014-05-19 – 2014-05-20 (×2): 237 mL via ORAL
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
## 2014-05-19 NOTE — Progress Notes (Deleted)
Physical Therapy Discharge Patient Details Name: Paul FordMichael A XXX [name] MRN: [id number] DOB: [birth year] Today's Date: 05/19/2014 Time:  -     Patient discharged from PT services secondary to Pt now up ad lib, for DC soon. RN confirmed..  Please see latest therapy progress note for current level of functioning and progress toward goals.    Progress and discharge plan discussed with patient and/or caregiver: pt repolied" I am good. I am getting up".  GP     [address] LaneKaren Elizabeth PrentissHill [name] PT [phone]  05/19/2014, 1:21 PM
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
## 2014-05-19 NOTE — Progress Notes (Signed)
PT recommendations unchanged.  Patient needs 24 hour supervision per PT.  We may need to send him to Rehab or SNF if that is not available at home.  This patient has been seen and I agree with the findings and treatment plan.  Marta LamasJames O. Gae BonWyatt, III, MD, FACS [phone] (pager) [phone] (direct pager) Trauma Surgeon

## 2014-05-20 DIAGNOSIS — S270XXA Traumatic pneumothorax, initial encounter: Secondary | ICD-10-CM | POA: Diagnosis present

## 2014-05-20 DIAGNOSIS — S2242XA Multiple fractures of ribs, left side, initial encounter for closed fracture: Secondary | ICD-10-CM | POA: Diagnosis present

## 2014-05-20 MED ORDER — ENALAPRIL MALEATE 5 MG PO TABS: 5.0000 mg | ORAL_TABLET | Freq: Every day | ORAL | Status: DC

## 2014-05-20 MED ORDER — OXYCODONE-ACETAMINOPHEN 10-325 MG PO TABS: 1.0000 | ORAL_TABLET | ORAL | Status: DC | PRN

## 2014-05-20 MED ORDER — TRAMADOL HCL 50 MG PO TABS: 100.0000 mg | ORAL_TABLET | Freq: Four times a day (QID) | ORAL | Status: DC

## 2014-05-20 MED ORDER — NAPROXEN 500 MG PO TABS: 500.0000 mg | ORAL_TABLET | Freq: Two times a day (BID) | ORAL | Status: DC

## 2014-05-20 NOTE — Progress Notes (Signed)
D/C today Cheryll Keisler, MD, MPH, FACS Trauma: [phone] General Surgery: [phone]  

## 2014-05-20 NOTE — Discharge Planning (Signed)
Copy of AVS to patient and rx, pt verbalizes understanding. Dc'd amb  To private car home with all personal belongings at 47.

## 2014-05-20 NOTE — Discharge Instructions (Signed)
No driving while taking oxycodone. °

## 2014-05-20 NOTE — Progress Notes (Signed)
Went to speak with patient about his orders for HHPT, rolling walker and medication assistance program.  Pt reported that he did not wish to have HHPT arranged and did not need the walker. I checked the medication assistance program records [facility]) and he last used the program on 06/12/2013 and so is not eligible at this time.  It is only allowed every 12 months. I explained this to the patient and he stated understanding. Assigned floor RN and medical team aware.
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
## 2014-05-20 NOTE — Discharge Summary (Signed)
Paul Wenk, MD, MPH, FACS Trauma: [phone] General Surgery: [phone]  
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
## 2014-05-24 ENCOUNTER — Emergency Department (HOSPITAL_COMMUNITY)
Admission: EM | Admit: 2014-05-24 | Discharge: 2014-05-24 | Discharge status: Against Medical Advice | Payer: Self-pay | Attending: General Practice | Admitting: General Practice

## 2014-05-24 ENCOUNTER — Encounter (HOSPITAL_COMMUNITY): Payer: Self-pay | Admitting: Emergency Medicine

## 2014-05-24 DIAGNOSIS — F17200 Nicotine dependence, unspecified, uncomplicated: Secondary | ICD-10-CM | POA: Insufficient documentation

## 2014-05-24 DIAGNOSIS — Z760 Encounter for issue of repeat prescription: Secondary | ICD-10-CM | POA: Insufficient documentation

## 2014-05-24 DIAGNOSIS — I10 Essential (primary) hypertension: Secondary | ICD-10-CM | POA: Insufficient documentation

## 2014-05-24 NOTE — ED Notes (Signed)
Pt states he lost his rx for his pain medications last week. Pt reports pain in left ribs.
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
## 2014-05-24 NOTE — ED Notes (Signed)
Called to room them and no answer

## 2014-05-24 NOTE — ED Notes (Signed)
Pt called to room pt and no answer

## 2014-05-24 NOTE — ED Notes (Signed)
Pt was called from waiting room x1 w/o any answer.

## 2014-05-25 ENCOUNTER — Emergency Department (HOSPITAL_COMMUNITY)
Admission: EM | Admit: 2014-05-25 | Discharge: 2014-05-25 | Disposition: A | Discharge status: Home / Self Care | Payer: MEDICAID

## 2014-05-25 NOTE — ED Notes (Signed)
Pt reports was here because he didn't have his prescriptions from an earlier visit.  Says while in waiting room his mother called and found his prescriptions so pt did not want to be seen today.  Informed pt he had the right to medical screening but says was only here to get new prescriptions.

## 2014-06-23 ENCOUNTER — Encounter (HOSPITAL_COMMUNITY): Payer: Self-pay | Admitting: Emergency Medicine

## 2014-06-23 ENCOUNTER — Emergency Department (HOSPITAL_COMMUNITY)
Admission: EM | Admit: 2014-06-23 | Discharge: 2014-06-23 | Disposition: A | Discharge status: Home / Self Care | Payer: Self-pay | Attending: Emergency Medicine | Admitting: Emergency Medicine

## 2014-06-23 ENCOUNTER — Emergency Department (HOSPITAL_COMMUNITY): Payer: Self-pay

## 2014-06-23 DIAGNOSIS — Z79899 Other long term (current) drug therapy: Secondary | ICD-10-CM | POA: Insufficient documentation

## 2014-06-23 DIAGNOSIS — Z8781 Personal history of (healed) traumatic fracture: Secondary | ICD-10-CM | POA: Insufficient documentation

## 2014-06-23 DIAGNOSIS — Z9119 Patient's noncompliance with other medical treatment and regimen: Secondary | ICD-10-CM | POA: Insufficient documentation

## 2014-06-23 DIAGNOSIS — Z8719 Personal history of other diseases of the digestive system: Secondary | ICD-10-CM | POA: Insufficient documentation

## 2014-06-23 DIAGNOSIS — Z91199 Patient's noncompliance with other medical treatment and regimen due to unspecified reason: Secondary | ICD-10-CM | POA: Insufficient documentation

## 2014-06-23 DIAGNOSIS — Z8709 Personal history of other diseases of the respiratory system: Secondary | ICD-10-CM | POA: Insufficient documentation

## 2014-06-23 DIAGNOSIS — Z87442 Personal history of urinary calculi: Secondary | ICD-10-CM | POA: Insufficient documentation

## 2014-06-23 DIAGNOSIS — Z87828 Personal history of other (healed) physical injury and trauma: Secondary | ICD-10-CM | POA: Insufficient documentation

## 2014-06-23 DIAGNOSIS — I10 Essential (primary) hypertension: Secondary | ICD-10-CM | POA: Insufficient documentation

## 2014-06-23 DIAGNOSIS — F17200 Nicotine dependence, unspecified, uncomplicated: Secondary | ICD-10-CM | POA: Insufficient documentation

## 2014-06-23 DIAGNOSIS — Z791 Long term (current) use of non-steroidal anti-inflammatories (NSAID): Secondary | ICD-10-CM | POA: Insufficient documentation

## 2014-06-23 DIAGNOSIS — G40909 Epilepsy, unspecified, not intractable, without status epilepticus: Secondary | ICD-10-CM | POA: Insufficient documentation

## 2014-06-23 DIAGNOSIS — R569 Unspecified convulsions: Secondary | ICD-10-CM

## 2014-06-23 LAB — ETHANOL: Alcohol, Ethyl (B): <11 mg/dL (ref 0–11)

## 2014-06-23 LAB — CBC WITH DIFFERENTIAL/PLATELET
BASOS ABS: 0 10*3/uL (ref 0.0–0.1)
BASOS PCT: 0 % (ref 0–1)
EOS ABS: 0 10*3/uL (ref 0.0–0.7)
EOS PCT: 1 % (ref 0–5)
HCT: 40.6 % (ref 39.0–52.0)
Hemoglobin: 14.4 g/dL (ref 13.0–17.0)
LYMPHS PCT: 35 % (ref 12–46)
Lymphs Abs: 1.7 10*3/uL (ref 0.7–4.0)
MCH: 33.6 pg (ref 26.0–34.0)
MCHC: 35.5 g/dL (ref 30.0–36.0)
MCV: 94.9 fL (ref 78.0–100.0)
MONO ABS: 0.3 10*3/uL (ref 0.1–1.0)
Monocytes Relative: 7 % (ref 3–12)
Neutro Abs: 2.8 10*3/uL (ref 1.7–7.7)
Neutrophils Relative %: 57 % (ref 43–77)
Platelets: 165 10*3/uL (ref 150–400)
RBC: 4.28 MIL/uL (ref 4.22–5.81)
RDW: 12.9 % (ref 11.5–15.5)
WBC: 4.9 10*3/uL (ref 4.0–10.5)

## 2014-06-23 LAB — RAPID URINE DRUG SCREEN, HOSP PERFORMED
Amphetamines: NOT DETECTED
BARBITURATES: NOT DETECTED
Benzodiazepines: NOT DETECTED
Cocaine: POSITIVE — AB
Opiates: NOT DETECTED
TETRAHYDROCANNABINOL: NOT DETECTED

## 2014-06-23 LAB — COMPREHENSIVE METABOLIC PANEL
ALT: 33 U/L (ref 0–53)
AST: 117 U/L — AB (ref 0–37)
Albumin: 3.5 g/dL (ref 3.5–5.2)
Alkaline Phosphatase: 133 U/L — ABNORMAL HIGH (ref 39–117)
BUN: 7 mg/dL (ref 6–23)
CALCIUM: 9 mg/dL (ref 8.4–10.5)
CO2: 22 mEq/L (ref 19–32)
CREATININE: 0.94 mg/dL (ref 0.50–1.35)
Chloride: 94 mEq/L — ABNORMAL LOW (ref 96–112)
GFR calc Af Amer: >90 mL/min (ref >90)
Glucose, Bld: 180 mg/dL — ABNORMAL HIGH (ref 70–99)
Potassium: 3 mEq/L — ABNORMAL LOW (ref 3.7–5.3)
Sodium: 138 mEq/L (ref 137–147)
TOTAL PROTEIN: 7.4 g/dL (ref 6.0–8.3)
Total Bilirubin: 1.5 mg/dL — ABNORMAL HIGH (ref 0.3–1.2)

## 2014-06-23 MED ORDER — LABETALOL HCL 5 MG/ML IV SOLN
40.0000 mg | Freq: Once | INTRAVENOUS | Status: AC
  Administered 2014-06-23: 40 mg via INTRAVENOUS
  Filled 2014-06-23: qty 8

## 2014-06-23 MED ORDER — LORAZEPAM 2 MG/ML IJ SOLN
1.0000 mg | Freq: Once | INTRAMUSCULAR | Status: AC
  Administered 2014-06-23: 1 mg via INTRAVENOUS

## 2014-06-23 MED ORDER — KETOROLAC TROMETHAMINE 30 MG/ML IJ SOLN
30.0000 mg | Freq: Once | INTRAMUSCULAR | Status: AC
  Administered 2014-06-23: 30 mg via INTRAVENOUS
  Filled 2014-06-23: qty 1

## 2014-06-23 MED ORDER — POTASSIUM CHLORIDE CRYS ER 20 MEQ PO TBCR
40.0000 meq | EXTENDED_RELEASE_TABLET | Freq: Once | ORAL | Status: AC
  Administered 2014-06-23: 40 meq via ORAL
  Filled 2014-06-23: qty 2

## 2014-06-23 MED ORDER — SODIUM CHLORIDE 0.9 % IV BOLUS (SEPSIS)
1000.0000 mL | Freq: Once | INTRAVENOUS | Status: AC
  Administered 2014-06-23: 1000 mL via INTRAVENOUS

## 2014-06-23 MED ORDER — IBUPROFEN 800 MG PO TABS: ORAL_TABLET | ORAL | Status: DC

## 2014-06-23 MED ORDER — SODIUM CHLORIDE 0.9 % IV BOLUS (SEPSIS)
1000.0000 mL | Freq: Once | INTRAVENOUS | Status: AC
Start: 2014-06-23 — End: 2014-06-23
  Administered 2014-06-23: 1000 mL via INTRAVENOUS

## 2014-06-23 MED ORDER — LABETALOL HCL 5 MG/ML IV SOLN
20.0000 mg | Freq: Once | INTRAVENOUS | Status: AC
  Administered 2014-06-23: 20 mg via INTRAVENOUS
  Filled 2014-06-23: qty 4

## 2014-06-23 MED ORDER — LORAZEPAM 2 MG/ML IJ SOLN
INTRAMUSCULAR | Status: AC
  Administered 2014-06-23: 1 mg via INTRAVENOUS
  Filled 2014-06-23: qty 1

## 2014-06-23 MED ORDER — SODIUM CHLORIDE 0.9 % IV SOLN
1000.0000 mg | Freq: Once | INTRAVENOUS | Status: AC
  Administered 2014-06-23: 1000 mg via INTRAVENOUS
  Filled 2014-06-23: qty 20

## 2014-06-23 MED ORDER — PHENYTOIN SODIUM EXTENDED 100 MG PO CAPS: ORAL_CAPSULE | ORAL | Status: DC

## 2014-06-23 MED ORDER — LISINOPRIL 20 MG PO TABS: 20.0000 mg | ORAL_TABLET | Freq: Every day | ORAL | Status: DC

## 2014-06-23 NOTE — ED Notes (Signed)
Pt confused/lethagic, trying to get OOB. Nonverbal. Zammitt made aware.

## 2014-06-23 NOTE — Care Management Note (Signed)
ED/CM noted patient did not have health insurance and/or PCP listed in the computer.  Patient was given the [place] resource handout with information on the clinics, food pantries, and the handout for new health insurance sign-up.  Patient expressed appreciation for information received. 

## 2014-06-23 NOTE — Discharge Instructions (Signed)
Follow up with a family md 
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
## 2014-06-23 NOTE — ED Notes (Signed)
Pt had seizure-like activity at approximately 1200. Seizure lasted approximately 1 minute. Dr Estell HarpinZammit notified and at bedside during event. Pt is currently alert but non-verbal. Seizure pads applied to bed rails.
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
## 2014-08-14 ENCOUNTER — Encounter (HOSPITAL_COMMUNITY): Payer: Self-pay | Admitting: Emergency Medicine

## 2014-08-14 ENCOUNTER — Emergency Department (HOSPITAL_COMMUNITY)
Admission: EM | Admit: 2014-08-14 | Discharge: 2014-08-14 | Disposition: A | Discharge status: Home / Self Care | Payer: Self-pay | Attending: Emergency Medicine | Admitting: Emergency Medicine

## 2014-08-14 DIAGNOSIS — R000 Tachycardia, unspecified: Secondary | ICD-10-CM | POA: Insufficient documentation

## 2014-08-14 DIAGNOSIS — R509 Fever, unspecified: Secondary | ICD-10-CM | POA: Insufficient documentation

## 2014-08-14 DIAGNOSIS — Z8639 Personal history of other endocrine, nutritional and metabolic disease: Secondary | ICD-10-CM | POA: Insufficient documentation

## 2014-08-14 DIAGNOSIS — Z91199 Patient's noncompliance with other medical treatment and regimen due to unspecified reason: Secondary | ICD-10-CM | POA: Insufficient documentation

## 2014-08-14 DIAGNOSIS — Z9119 Patient's noncompliance with other medical treatment and regimen: Secondary | ICD-10-CM | POA: Insufficient documentation

## 2014-08-14 DIAGNOSIS — Z8659 Personal history of other mental and behavioral disorders: Secondary | ICD-10-CM | POA: Insufficient documentation

## 2014-08-14 DIAGNOSIS — F10920 Alcohol use, unspecified with intoxication, uncomplicated: Secondary | ICD-10-CM

## 2014-08-14 DIAGNOSIS — R112 Nausea with vomiting, unspecified: Secondary | ICD-10-CM | POA: Insufficient documentation

## 2014-08-14 DIAGNOSIS — R1013 Epigastric pain: Secondary | ICD-10-CM | POA: Insufficient documentation

## 2014-08-14 DIAGNOSIS — I10 Essential (primary) hypertension: Secondary | ICD-10-CM | POA: Insufficient documentation

## 2014-08-14 DIAGNOSIS — G40909 Epilepsy, unspecified, not intractable, without status epilepticus: Secondary | ICD-10-CM | POA: Insufficient documentation

## 2014-08-14 DIAGNOSIS — F17200 Nicotine dependence, unspecified, uncomplicated: Secondary | ICD-10-CM | POA: Insufficient documentation

## 2014-08-14 DIAGNOSIS — Z8709 Personal history of other diseases of the respiratory system: Secondary | ICD-10-CM | POA: Insufficient documentation

## 2014-08-14 DIAGNOSIS — Z87448 Personal history of other diseases of urinary system: Secondary | ICD-10-CM | POA: Insufficient documentation

## 2014-08-14 DIAGNOSIS — Z8719 Personal history of other diseases of the digestive system: Secondary | ICD-10-CM | POA: Insufficient documentation

## 2014-08-14 DIAGNOSIS — F1010 Alcohol abuse, uncomplicated: Secondary | ICD-10-CM | POA: Insufficient documentation

## 2014-08-14 DIAGNOSIS — Z791 Long term (current) use of non-steroidal anti-inflammatories (NSAID): Secondary | ICD-10-CM | POA: Insufficient documentation

## 2014-08-14 DIAGNOSIS — Z79899 Other long term (current) drug therapy: Secondary | ICD-10-CM | POA: Insufficient documentation

## 2014-08-14 DIAGNOSIS — Z862 Personal history of diseases of the blood and blood-forming organs and certain disorders involving the immune mechanism: Secondary | ICD-10-CM | POA: Insufficient documentation

## 2014-08-14 DIAGNOSIS — R109 Unspecified abdominal pain: Secondary | ICD-10-CM

## 2014-08-14 DIAGNOSIS — R42 Dizziness and giddiness: Secondary | ICD-10-CM | POA: Insufficient documentation

## 2014-08-14 DIAGNOSIS — Z8781 Personal history of (healed) traumatic fracture: Secondary | ICD-10-CM | POA: Insufficient documentation

## 2014-08-14 LAB — COMPREHENSIVE METABOLIC PANEL
ALT: 24 U/L (ref 0–53)
AST: 58 U/L — ABNORMAL HIGH (ref 0–37)
Albumin: 3.6 g/dL (ref 3.5–5.2)
Alkaline Phosphatase: 117 U/L (ref 39–117)
Anion gap: 17 — ABNORMAL HIGH (ref 5–15)
BUN: 16 mg/dL (ref 6–23)
CO2: 27 meq/L (ref 19–32)
Calcium: 8.8 mg/dL (ref 8.4–10.5)
Chloride: 99 mEq/L (ref 96–112)
Creatinine, Ser: 0.82 mg/dL (ref 0.50–1.35)
GFR calc Af Amer: >90 mL/min (ref >90)
Glucose, Bld: 178 mg/dL — ABNORMAL HIGH (ref 70–99)
Potassium: 3.3 mEq/L — ABNORMAL LOW (ref 3.7–5.3)
SODIUM: 143 meq/L (ref 137–147)
TOTAL PROTEIN: 7.4 g/dL (ref 6.0–8.3)
Total Bilirubin: 0.5 mg/dL (ref 0.3–1.2)

## 2014-08-14 LAB — CBC WITH DIFFERENTIAL/PLATELET
BASOS ABS: 0 10*3/uL (ref 0.0–0.1)
Basophils Relative: 0 % (ref 0–1)
Eosinophils Absolute: 0 10*3/uL (ref 0.0–0.7)
Eosinophils Relative: 0 % (ref 0–5)
HCT: 42.3 % (ref 39.0–52.0)
Hemoglobin: 14.9 g/dL (ref 13.0–17.0)
LYMPHS ABS: 2.1 10*3/uL (ref 0.7–4.0)
LYMPHS PCT: 50 % — AB (ref 12–46)
MCH: 32.7 pg (ref 26.0–34.0)
MCHC: 35.2 g/dL (ref 30.0–36.0)
MCV: 92.8 fL (ref 78.0–100.0)
Monocytes Absolute: 0.4 10*3/uL (ref 0.1–1.0)
Monocytes Relative: 9 % (ref 3–12)
NEUTROS PCT: 41 % — AB (ref 43–77)
Neutro Abs: 1.7 10*3/uL (ref 1.7–7.7)
PLATELETS: 157 10*3/uL (ref 150–400)
RBC: 4.56 MIL/uL (ref 4.22–5.81)
RDW: 13.5 % (ref 11.5–15.5)
WBC: 4.2 10*3/uL (ref 4.0–10.5)

## 2014-08-14 LAB — ETHANOL: Alcohol, Ethyl (B): 302 mg/dL — ABNORMAL HIGH (ref 0–11)

## 2014-08-14 LAB — LIPASE, BLOOD: Lipase: 83 U/L — ABNORMAL HIGH (ref 11–59)

## 2014-08-14 MED ORDER — HYDROMORPHONE HCL PF 1 MG/ML IJ SOLN
1.0000 mg | Freq: Once | INTRAMUSCULAR | Status: AC
  Administered 2014-08-14: 1 mg via INTRAVENOUS
  Filled 2014-08-14: qty 1

## 2014-08-14 MED ORDER — SODIUM CHLORIDE 0.9 % IV BOLUS (SEPSIS)
1000.0000 mL | Freq: Once | INTRAVENOUS | Status: AC
  Administered 2014-08-14: 1000 mL via INTRAVENOUS

## 2014-08-14 MED ORDER — ONDANSETRON HCL 4 MG/2ML IJ SOLN
4.0000 mg | Freq: Once | INTRAMUSCULAR | Status: AC
  Administered 2014-08-14: 4 mg via INTRAVENOUS
  Filled 2014-08-14: qty 2

## 2014-08-14 NOTE — ED Notes (Signed)
Pt presents to ED c/o nausea constant x 3 days; states that he has vomited "many times per day." He states that he is unable to keep any food down. His last meal that he tolerated was on Thursday. He states that he has smoked crack but not in the last 2 days. He also states that he has had a fifth of wine today, but has not been able to keep it down either. States that his emesis is red, "almost like straight blood." (also states he drinks red wine "but this looks like real blood." NAD noted.

## 2014-08-14 NOTE — ED Notes (Signed)
Pt with abd pain with N/V since Thursday, + fever per pt, denies diarrhea

## 2014-08-14 NOTE — Discharge Instructions (Signed)
Abdominal Pain Many things can cause abdominal pain. Usually, abdominal pain is not caused by a disease and will improve without treatment. It can often be observed and treated at home. Your health care provider will do a physical exam and possibly order blood tests and X-rays to help determine the seriousness of your pain. However, in many cases, more time must pass before a clear cause of the pain can be found. Before that point, your health care provider may not know if you need more testing or further treatment. HOME CARE INSTRUCTIONS  Monitor your abdominal pain for any changes. The following actions may help to alleviate any discomfort you are experiencing:  Only take over-the-counter or prescription medicines as directed by your health care provider.  Do not take laxatives unless directed to do so by your health care provider.  Try a clear liquid diet (broth, tea, or water) as directed by your health care provider. Slowly move to a bland diet as tolerated. SEEK MEDICAL CARE IF:  You have unexplained abdominal pain.  You have abdominal pain associated with nausea or diarrhea.  You have pain when you urinate or have a bowel movement.  You experience abdominal pain that wakes you in the night.  You have abdominal pain that is worsened or improved by eating food.  You have abdominal pain that is worsened with eating fatty foods.  You have a fever. SEEK IMMEDIATE MEDICAL CARE IF:   Your pain does not go away within 2 hours.  You keep throwing up (vomiting).  Your pain is felt only in portions of the abdomen, such as the right side or the left lower portion of the abdomen.  You pass bloody or black tarry stools. MAKE SURE YOU:  Understand these instructions.   Will watch your condition.   Will get help right away if you are not doing well or get worse.  Document Released: 09/25/2005 Document Revised: 12/21/2013 Document Reviewed: 08/25/2013 [facility] Patient Information  2015 [place], [place]. This information is not intended to replace advice given to you by your health care provider. Make sure you discuss any questions you have with your health care provider.  Alcohol and Nutrition Nutrition serves two purposes. It provides energy. It also maintains body structure and function. Food supplies energy. It also provides the building blocks needed to replace worn or damaged cells. Alcoholics often eat poorly. This limits their supply of essential nutrients. This affects energy supply and structure maintenance. Alcohol also affects the body's nutrients in:  Digestion.  Storage.  Using and getting rid of waste products. IMPAIRMENT OF NUTRIENT DIGESTION AND UTILIZATION   Once ingested, food must be broken down into small components (digested). Then it is available for energy. It helps maintain body structure and function. Digestion begins in the mouth. It continues in the stomach and intestines, with help from the pancreas. The nutrients from digested food are absorbed from the intestines into the blood. Then they are carried to the liver. The liver prepares nutrients for:  Immediate use.  Storage and future use.  Alcohol inhibits the breakdown of nutrients into usable molecules.  It decreases secretion of digestive enzymes from the pancreas.  Alcohol impairs nutrient absorption by damaging the cells lining the stomach and intestines.  It also interferes with moving some nutrients into the blood.  In addition, nutritional deficiencies themselves may lead to further absorption problems.  For example, folate deficiency changes the cells that line the small intestine. This impairs how water is absorbed. It  also affects absorbed nutrients. These include glucose, sodium, and additional folate.  Even if nutrients are digested and absorbed, alcohol can prevent them from being fully used. It changes their transport, storage, and excretion. Impaired utilization of  nutrients by alcoholics is indicated by:  Decreased liver stores of vitamins, such as vitamin A.  Increased excretion of nutrients such as fat. ALCOHOL AND ENERGY SUPPLY   Three basic nutritional components found in food are:  Carbohydrates.  Proteins.  Fats.  These are used as energy. Some alcoholics take in as much as 50% of their total daily calories from alcohol. They often neglect important foods.  Even when enough food is eaten, alcohol can impair the ways the body controls blood sugar (glucose) levels. It may either increase or decrease blood sugar.  In non-diabetic alcoholics, increased blood sugar (hyperglycemia) is caused by poor insulin secretion. It is usually temporary.  Decreased blood sugar (hypoglycemia) can cause serious injury even if this condition is short-lived. Low blood sugar can happen when a fasting or malnourished person drinks alcohol. When there is no food to supply energy, stored sugar is used up. The products of alcohol inhibit forming glucose from other compounds such as amino acids. As a result, alcohol causes the brain and other body tissue to lack glucose. It is needed for energy and function.  Alcohol is an energy source. But how the body processes and uses the energy from alcohol is complex. Also, when alcohol is substituted for carbohydrates, subjects tend to lose weight. This indicates that they get less energy from alcohol than from food. ALCOHOL - MAINTAINING CELL STRUCTURE AND FUNCTION  Structure Cells are made mostly of protein. So an adequate protein diet is important for maintaining cell structure. This is especially true if cells are being damaged. Research indicates that alcohol affects protein nutrition by causing impaired:  Digestion of proteins to amino acids.  Processing of amino acids by the small intestine and liver.  Synthesis of proteins from amino acids.  Protein secretion by the liver. Function Nutrients are essential for the  body to function well. They provide the tools that the body needs to work well:   Proteins.  Vitamins.  Minerals. Alcohol can disrupt body function. It may cause nutrient deficiencies. And it may interfere with the way nutrients are processed. Vitamins  Vitamins are essential to maintain growth and normal metabolism. They regulate many of the body`s processes. Chronic heavy drinking causes deficiencies in many vitamins. This is caused by eating less. And, in some cases, vitamins may be poorly absorbed. For example, alcohol inhibits fat absorption. It impairs how the vitamins A, E, and D are normally absorbed along with dietary fats. Not enough vitamin A may cause night blindness. Not enough vitamin D may cause softening of the bones.  Some alcoholics lack vitamins A, C, D, E, K, and the B vitamins. These are all involved in wound healing and cell maintenance. In particular, because vitamin K is necessary for blood clotting, lacking that vitamin can cause delayed clotting. The result is excess bleeding. Lacking other vitamins involved in brain function may cause severe neurological damage. Minerals Deficiencies of minerals such as calcium, magnesium, iron, and zinc are common in alcoholics. The alcohol itself does not seem to affect how these minerals are absorbed. Rather, they seem to occur secondary to other alcohol-related problems, such as:  Less calcium absorbed.  Not enough magnesium.  More urinary excretion.  Vomiting.  Diarrhea.  Not enough iron due to gastrointestinal bleeding.  Not enough zinc or losses related to other nutrient deficiencies.  Mineral deficiencies can cause a variety of medical consequences. These range from calcium-related bone disease to zinc-related night blindness and skin lesions. ALCOHOL, MALNUTRITION, AND MEDICAL COMPLICATIONS  Liver Disease   Alcoholic liver damage is caused primarily by alcohol itself. But poor nutrition may increase the risk of  alcohol-related liver damage. For example, nutrients normally found in the liver are known to be affected by drinking alcohol. These include carotenoids, which are the major sources of vitamin A, and vitamin E compounds. Decreases in such nutrients may play some role in alcohol-related liver damage. Pancreatitis  Research suggests that malnutrition may increase the risk of developing alcoholic pancreatitis. Research suggests that a diet lacking in protein may increase alcohol's damaging effect on the pancreas. Brain  Nutritional deficiencies may have severe effects on brain function. These may be permanent. Specifically, thiamine deficiencies are often seen in alcoholics. They can cause severe neurological problems. These include:  Impaired movement.  Memory loss seen in Wernicke-Korsakoff syndrome. Pregnancy  Alcohol has toxic effects on fetal development. It causes alcohol-related birth defects. They include fetal alcohol syndrome. Alcohol itself is toxic to the fetus. Also, the nutritional deficiency can affect how the fetus develops. That may compound the risk of developmental damage.  Nutritional needs during pregnancy are 10% to 30% greater than normal. Food intake can increase by as much as 140% to cover the needs of both mother and fetus. An alcoholic mother`s nutritional problems may adversely affect the nutrition of the fetus. And alcohol itself can also restrict nutrition flow to the fetus. NUTRITIONAL STATUS OF ALCOHOLICS  Techniques for assessing nutritional status include:  Taking body measurements to estimate fat reserves. They include:  Weight.  Height.  Mass.  Skin fold thickness.  Performing blood analysis to provide measurements of circulating:  Proteins.  Vitamins.  Minerals.  These techniques tend to be imprecise. For many nutrients, there is no clear "cut-off" point that would allow an accurate definition of deficiency. So assessing the nutritional status of  alcoholics is limited by these techniques. Dietary status may provide information about the risk of developing nutritional problems. Dietary status is assessed by:  Taking patients' dietary histories.  Evaluating the amount and types of food they are eating.  It is difficult to determine what exact amount of alcohol begins to have damaging effects on nutrition. In general, moderate drinkers have 2 drinks or less per day. They seem to be at little risk for nutritional problems. Various medical disorders begin to appear at greater levels.  Research indicates that the majority of even the heaviest drinkers have few obvious nutritional deficiencies. Many alcoholics who are hospitalized for medical complications of their disease do have severe malnutrition. Alcoholics tend to eat poorly. Often they eat less than the amounts of food necessary to provide enough:  Carbohydrates.  Protein.  Fat.  Vitamins A and C.  B vitamins.  Minerals like calcium and iron. Of major concern is alcohol's effect on digesting food and use of nutrients. It may shift a mildly malnourished person toward severe malnutrition. Document Released: 10/10/2005 Document Revised: 03/09/2012 Document Reviewed: 03/26/2006 [facility] Patient Information 2015 [place], [place]. This information is not intended to replace advice given to you by your health care provider. Make sure you discuss any questions you have with your health care provider.    Emergency Department Resource Guide 1) Find a Doctor and Pay Out of Pocket Although you won't have to find out who  is covered by your insurance plan, it is a good idea to ask around and get recommendations. You will then need to call the office and see if the doctor you have chosen will accept you as a new patient and what types of options they offer for patients who are self-pay. Some doctors offer discounts or will set up payment plans for their patients who do not have insurance, but  you will need to ask so you aren't surprised when you get to your appointment.  2) Contact Your Local Health Department Not all health departments have doctors that can see patients for sick visits, but many do, so it is worth a call to see if yours does. If you don't know where your local health department is, you can check in your phone book. The CDC also has a tool to help you locate your state's health department, and many state websites also have listings of all of their local health departments.  3) Find a Walk-in Clinic If your illness is not likely to be very severe or complicated, you may want to try a walk in clinic. These are popping up all over the country in pharmacies, drugstores, and shopping centers. They're usually staffed by nurse practitioners or physician assistants that have been trained to treat common illnesses and complaints. They're usually fairly quick and inexpensive. However, if you have serious medical issues or chronic medical problems, these are probably not your best option.  No Primary Care Doctor: - Call Health Connect at  [phone] - they can help you locate a primary care doctor that  accepts your insurance, provides certain services, etc. - Physician Referral Service- [phone]  Chronic Pain Problems: Organization         Address  Phone   Notes  [facility]  [phone] Patients need to be referred by their primary care doctor.   Medication Assistance: Organization         Address  Phone   Notes  [facility] Medication [facility] [address] [place] [place], [place] [address] [phone] --Must be a resident of [facility] -- Must have NO insurance coverage whatsoever (no Medicaid/ Medicare, etc.) -- The pt. MUST have a primary care doctor that directs their care regularly and follows them in the community   [facility]  [phone]   [employer]  [phone]    Agencies that provide inexpensive  medical care: Organization         Address  Phone   Notes  [facility]  [phone]   [facility]    [phone]   [facility] [address] [place], [place] [address] [phone]   [facility] [address] [place] [place] [phone]   Planned Parenthood    [phone]   [facility]    [phone]   Community Health and [facility]  [address], [place] Phone:  [phone], Fax:  [phone] Hours of Operation:  9 am - 6 pm, M-F.  Also accepts Medicaid/Medicare and self-pay.  [facility]  [address], [place] Phone: [phone], Fax: [phone]. Hours of Operation:  8:30 am - 5:30 pm, M-F.  Also accepts Medicaid and self-pay.  [facility] [place] [address], [place] Phone: [phone]   [facility] [address] Natasha Bence [place], [place] [phone],  Ext. [phone] Mondays & Thursdays: 7-9 AM.  First 15 patients are seen on a first come, first serve basis.    Medicaid-accepting [facility] Providers:  Organization         Address  Phone   Notes  [facility] [address], [place] [phone]) [phone] Also accepts self-pay patients.  [facility] [address], [place]  [phone]) [phone]   [facility] [address], [place] [phone]) [phone]   [facility] [address] [place] [address], [place] [phone]   Renaye RakersVeita Bland [address], [place]   [phone] Only accepts [place] Access [place] patients after they have their name applied to their card.   Self-Pay (no insurance) in [facility]:  Organization         Address  Phone   Notes  Sickle Cell Patients, [facility] [address], [place] [phone]   [facility] [address], [place] [phone]   [facility]  [address], [place] [phone]   [facility]/Dr. Osei-Bonsu  [address] [place] [address], [place] or [phone], [place] [phone] Phone number for both [place] and [place] locations is the same.  [facility] [address], [place] [phone]   [facility] [address] [place] [address], [place] or [address] [phone]) [phone]   [facility] [address], [place] [phone], phone; [phone]) [phone], fax Sees patients 1st and 3rd Saturday of every month.  Must not qualify for public or private insurance (i.e. Medicaid, Medicare, [place] Health Choice, Veterans' Benefits)  Household income should be no more than 200% of the poverty level The clinic cannot treat you if you are pregnant or think you are pregnant  Sexually transmitted diseases are not treated at the clinic.    Dental Care: Organization         Address  Phone  Notes  [facility] Department of [facility] [address], [place] [phone] Accepts children up to age 48 who are enrolled in [place] or [place] Health Choice; pregnant women with a Medicaid card; and children who have applied for Medicaid or [place] Health Choice, but were declined, whose parents can pay a reduced fee at time of service.  [facility] [place]  [address], [place] [phone] Accepts children up to age 48 who are enrolled in [place] or [place] Health Choice; pregnant women with a Medicaid card; and children who have applied for Medicaid or [place] Health Choice, but were declined, whose parents can pay a reduced fee at time of service.  [facility]  [address], [place] [phone]) [phone] Patients are seen by appointment only. Walk-ins are not accepted.  [facility] will see patients 48 years of age and older. Monday - Tuesday (8am-5pm) Most Wednesdays (8:30-5pm) $30 per visit, cash only  [facility]  [address], [facility] [phone]) [phone] Patients are seen by appointment only. Walk-ins are not accepted. [facility] will see patients 48 years of age and older. One Wednesday Evening (Monthly: Volunteer Based).  $30 per visit, cash only  [facility]  [phone]  for adults; Children under age 26, call Graduate Pediatric Dentistry at [phone]. Children aged 49-14, please call [phone] to request a pediatric application.  Dental services are provided in all areas of dental care including fillings, crowns and bridges, complete and partial dentures, implants, gum treatment, root canals, and extractions. Preventive care is also provided. Treatment is provided to both adults and children. Patients are selected via a lottery and there is often a waiting list.   [facility] [address], [place]  [phone] [email or web address]   [facility] [address] [place], [place] [phone], Ext. [phone] Second and Fourth Thursday of each month, opens at 6:30 AM; Clinic ends at 9 AM.  Patients are seen on a first-come first-served basis, and a limited number are seen during each clinic.   [facility]  [address] [place], [place] [phone]   Eligibility Requirements You must have lived in [place], [place], or [place] counties for at least the last three months.   You cannot be eligible for state or federal sponsored [place], including [employer], [place], or [employer].   You generally cannot be eligible for healthcare insurance through your employer.    How to apply: Eligibility screenings are held every Tuesday and Wednesday afternoon from 1:00 pm until 4:00 pm. You do not need an appointment for the  interview!  [facility] [address] [place], [place] [phone]   [facility] Health Department  [phone]   [facility] Health Department  [phone]   [facility] Health Department  [phone]    Behavioral Health Resources in the Community: Intensive Outpatient Programs Organization         Address  Phone  Notes  [facility] [address], [place], [place] [phone]   [facility] Outpatient [address] [place], [place] [phone]   [facility] [address] [place], [place]  [phone]   [facility] Health [address] [place] [address]  [place], [place] [phone] or [phone]   Substance Abuse Resources Organization         Address  Phone  Notes  [facility] Drug [facility]  [phone]   [facility]  [phone]   [facility] [place]  [phone]   Floydene Flock  [phone]   Residential & Outpatient Substance Abuse Program  [phone]   Psychological Services Organization         Address  Phone  Notes  [facility] Behavioral Health  [phone]   [facility]  [phone]   [facility] Mental Health [address] [place] [phone] or [phone]    Mobile Crisis Teams Organization         Address  Phone  Notes  [facility], Mobile Crisis Care Unit  [phone]   [facility]  [address] [place], [place] [phone]   Doristine Locks [address] [place] [phone]    Self-Help/Support Groups Organization         Address  Phone             Notes  [facility] [place] - variety of support groups  [phone]- [phone] Call for more information  Narcotics Anonymous (NA), [facility] [address], [employer] [place]  2 meetings at this location   Chief Executive Officer  Notes  ASAP  Residential Treatment  [address],    [place]  [phone]   [facility]  [address] [place] [address], [place], [place] [phone]   [facility] [address] [place], [place] [phone] Admissions: 8am-3pm M-F  [facility] [address],    [place], [place] [phone]   [facility] [address] [place], [place], [place] [phone]   [facility] [address],  [place], [place] [phone]   [facility] - Intensive Outpatient [address], [place], [place] [phone]   [facility] ([facility] [name]  [place], [place] [phone] or [phone]   [facility] ([facility]) [address], [place], [place] [phone] Accepts Medicaid  [facility] [address],  [place] [phone] Substance Abuse/Addiction Treatment   [facility]         Address  Phone  Notes  [facility]  [phone]   Angie Fava, PhD [address] Ervin Knack [place], [place]   [phone] or [phone]   [facility]   [address] [place], [place] [phone]   [facility] [address], [place], [place] [phone] Insurance/Medicaid/sponsorship through [facility] [address]                                    [place], [place] [phone] Therapy/tele-psych/case  [facility] [address], [place] [phone]    Dr. Lolly Mustache  [phone]   [facility]  [employer] [facility] Dept. 1) [address], [place] 2) [address], [place] 3)  [address], [place] [phone]  [phone]   [facility] Child Abuse Hotline [phone] or [phone] (After Hours)

## 2014-08-14 NOTE — ED Provider Notes (Signed)
CSN: [id number]     Arrival date & time 08/14/14  1415 History  This chart was scribed for Raeford RazorStephen Harutyun Monteverde, MD by Elon SpannerGarrett Cook, ED Scribe. This patient was seen in room APA07/APA07 and the patient's care was started at 2:47 PM.    Chief Complaint  Patient presents with  . Emesis   The history is provided by the patient. No language interpreter was used.    HPI Comments: Paul [name] is a 48 y.o. male who presents to the Emergency Department complaining of several days of emesis with associated "sharp" umbilical abdominal pain described as feeling like "hell".  He states that the emesis looks like blood but that he has been drinking wine.  Patient states he has experienced several episodes like this in the past and that his complain is similar to past episodes of pancreatitis.  Patient has associated fever, chills, small amounts of bright-red bloody stool, melena.  Patient also reports he is currently dizzy, lightheadedness.  Patient has a history of alcohol dependency and is a daily drinker.  He states he drank today.  Patient denies history of cirrhosis of the liver, gallstones.   Patient denies diarrhea, SOB, other pain.     Patient states he has a history of seizures and has been out of his medication for 2 weeks.  His most recent seizure was 3 weeks ago.  Past Medical History  Diagnosis Date  . Pancreatitis, acute     First episode earlier in 2012, hospitalized again in 02/2012 and 09/2012  . Hypertension     noncompliance  . Hyperlipidemia     noncompliance  . Lung collapse     h/o  . ETOH abuse   . Cocaine abuse   . DTs (delirium tremens)     history of  . Tobacco abuse   . Seizure disorder 07/17/2013  . Noncompliance 07/17/2013  . Traumatic subdural hematoma 06/15/2013  . 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 Open skull fracture 06/15/2013  . Polysubstance abuse     etoh, cocaine  . Nephrolithiasis   . Seizures    Past Surgical History  Procedure Laterality  Date  . Mandible fracture surgery  2006  . Left axillary      surgery for deep laceration   Family History  Problem Relation Age of Onset  . Diabetes type II Mother   . Liver disease Neg Hx   . Colon cancer Neg Hx   . GI problems Neg Hx    History  Substance Use Topics  . Smoking status: Current Every Day Smoker -- 1.00 packs/day    Types: Cigarettes  . Smokeless tobacco: Not on file  . Alcohol Use: Yes     Comment: 1/5 wine per day    Review of Systems  Constitutional: Positive for fever and chills.  Respiratory: Negative for shortness of breath.   Gastrointestinal: Positive for vomiting, abdominal pain and blood in stool. Negative for diarrhea.  Neurological: Positive for dizziness and light-headedness.  All other systems reviewed and are negative.     Allergies  Review of patient's allergies indicates no known allergies.  Home Medications   Prior to Admission medications   Medication Sig Start Date End Date Taking? Authorizing Provider  ibuprofen (ADVIL,MOTRIN) 800 MG tablet One po tid prn pain 06/23/14   Benny LennertJoseph L Zammit, MD  lisinopril (PRINIVIL,ZESTRIL) 20 MG tablet Take 1 tablet (20 mg total) by mouth daily. 06/23/14   Benny LennertJoseph L Zammit, MD  phenytoin (DILANTIN) 100 MG ER  capsule Take 3 pills at bed time 06/23/14   Benny Lennert, MD   BP 195/123  Pulse 122  Temp(Src) 98.7 F (37.1 C) (Oral)  Resp 20  Ht 6' (1.829 m)  Wt 158 lb (71.668 kg)  BMI 21.42 kg/m2  SpO2 99% Physical Exam  Nursing note and vitals reviewed. Constitutional: He is oriented to person, place, and time. He appears well-developed and well-nourished.  Non-toxic appearance. He does not appear ill. No distress.  Breath smells of alcohol.   HENT:  Head: Normocephalic and atraumatic.  Right Ear: External ear normal.  Left Ear: External ear normal.  Nose: Nose normal. No mucosal edema or rhinorrhea.  Mouth/Throat: Oropharynx is clear and moist and mucous membranes are normal. No dental  abscesses or uvula swelling.  Eyes: Conjunctivae and EOM are normal. Pupils are equal, round, and reactive to light.  Neck: Normal range of motion and full passive range of motion without pain. Neck supple.  Cardiovascular: Normal rate, regular rhythm and normal heart sounds.  Exam reveals no gallop and no friction rub.   No murmur heard. tachycardic  Pulmonary/Chest: Effort normal and breath sounds normal. No respiratory distress. He has no wheezes. He has no rhonchi. He has no rales. He exhibits no tenderness and no crepitus.  Abdominal: Soft. Normal appearance and bowel sounds are normal. He exhibits no distension. There is no tenderness. There is no rebound and no guarding.  Mild tenderness in the epigastric region and periumbilically   Musculoskeletal: Normal range of motion. He exhibits no edema and no tenderness.  Moves all extremities well.   Neurological: He is alert and oriented to person, place, and time. He has normal strength. No cranial nerve deficit.  Skin: Skin is warm, dry and intact. No rash noted. No erythema. No pallor.  Psychiatric: He has a normal mood and affect. His speech is normal and behavior is normal. His mood appears not anxious.    ED Course  Procedures (including critical care time)  DIAGNOSTIC STUDIES: Oxygen Saturation is 99% on RA, normal by my interpretation.    COORDINATION OF CARE:  2:53 PM Discussed treatment plan with patient at bedside.  Patient acknowledges and agrees with plan.    Labs Review Labs Reviewed  CBC WITH DIFFERENTIAL - Abnormal; Notable for the following:    Neutrophils Relative % 41 (*)    Lymphocytes Relative 50 (*)    All other components within normal limits  COMPREHENSIVE METABOLIC PANEL - Abnormal; Notable for the following:    Potassium 3.3 (*)    Glucose, Bld 178 (*)    AST 58 (*)    Anion gap 17 (*)    All other components within normal limits  ETHANOL - Abnormal; Notable for the following:    Alcohol, Ethyl (B)  302 (*)    All other components within normal limits  LIPASE, BLOOD - Abnormal; Notable for the following:    Lipase 83 (*)    All other components within normal limits    Imaging Review No results found.   EKG Interpretation None      MDM   Final diagnoses:  Abdominal pain, unspecified abdominal location  Non-intractable vomiting with nausea, vomiting of unspecified type  Alcohol intoxication, uncomplicated   48yM with abdominal pain and n/v. ETOH abuse/intoxication. Mild elevation in lipase, but symptoms controlled in ED. Low suspicion for emergent process. Outpt FU. Return precautions discussed.   I personally preformed the services scribed in my presence. The recorded information has  been reviewed is accurate. Raeford Razor, MD.    Raeford Razor, MD 08/22/14 [phone]

## 2014-08-15 ENCOUNTER — Emergency Department (HOSPITAL_COMMUNITY)
Admission: EM | Admit: 2014-08-15 | Discharge: 2014-08-15 | Disposition: A | Discharge status: Home / Self Care | Payer: Self-pay | Attending: Emergency Medicine | Admitting: Emergency Medicine

## 2014-08-15 ENCOUNTER — Emergency Department (HOSPITAL_COMMUNITY): Payer: Self-pay

## 2014-08-15 ENCOUNTER — Encounter (HOSPITAL_COMMUNITY): Payer: Self-pay | Admitting: Emergency Medicine

## 2014-08-15 DIAGNOSIS — R000 Tachycardia, unspecified: Secondary | ICD-10-CM | POA: Insufficient documentation

## 2014-08-15 DIAGNOSIS — Z862 Personal history of diseases of the blood and blood-forming organs and certain disorders involving the immune mechanism: Secondary | ICD-10-CM | POA: Insufficient documentation

## 2014-08-15 DIAGNOSIS — S0003XA Contusion of scalp, initial encounter: Secondary | ICD-10-CM | POA: Insufficient documentation

## 2014-08-15 DIAGNOSIS — Y939 Activity, unspecified: Secondary | ICD-10-CM | POA: Insufficient documentation

## 2014-08-15 DIAGNOSIS — S0083XA Contusion of other part of head, initial encounter: Secondary | ICD-10-CM | POA: Insufficient documentation

## 2014-08-15 DIAGNOSIS — Z8659 Personal history of other mental and behavioral disorders: Secondary | ICD-10-CM | POA: Insufficient documentation

## 2014-08-15 DIAGNOSIS — F17200 Nicotine dependence, unspecified, uncomplicated: Secondary | ICD-10-CM | POA: Insufficient documentation

## 2014-08-15 DIAGNOSIS — Z8781 Personal history of (healed) traumatic fracture: Secondary | ICD-10-CM | POA: Insufficient documentation

## 2014-08-15 DIAGNOSIS — F1010 Alcohol abuse, uncomplicated: Secondary | ICD-10-CM | POA: Insufficient documentation

## 2014-08-15 DIAGNOSIS — Z23 Encounter for immunization: Secondary | ICD-10-CM | POA: Insufficient documentation

## 2014-08-15 DIAGNOSIS — I10 Essential (primary) hypertension: Secondary | ICD-10-CM | POA: Insufficient documentation

## 2014-08-15 DIAGNOSIS — Z8709 Personal history of other diseases of the respiratory system: Secondary | ICD-10-CM | POA: Insufficient documentation

## 2014-08-15 DIAGNOSIS — S1093XA Contusion of unspecified part of neck, initial encounter: Secondary | ICD-10-CM

## 2014-08-15 DIAGNOSIS — K2990 Gastroduodenitis, unspecified, without bleeding: Principal | ICD-10-CM

## 2014-08-15 DIAGNOSIS — Y929 Unspecified place or not applicable: Secondary | ICD-10-CM | POA: Insufficient documentation

## 2014-08-15 DIAGNOSIS — G40909 Epilepsy, unspecified, not intractable, without status epilepticus: Secondary | ICD-10-CM | POA: Insufficient documentation

## 2014-08-15 DIAGNOSIS — Z8719 Personal history of other diseases of the digestive system: Secondary | ICD-10-CM | POA: Insufficient documentation

## 2014-08-15 DIAGNOSIS — Z8639 Personal history of other endocrine, nutritional and metabolic disease: Secondary | ICD-10-CM | POA: Insufficient documentation

## 2014-08-15 DIAGNOSIS — W1809XA Striking against other object with subsequent fall, initial encounter: Secondary | ICD-10-CM | POA: Insufficient documentation

## 2014-08-15 DIAGNOSIS — Z87828 Personal history of other (healed) physical injury and trauma: Secondary | ICD-10-CM | POA: Insufficient documentation

## 2014-08-15 DIAGNOSIS — Z79899 Other long term (current) drug therapy: Secondary | ICD-10-CM | POA: Insufficient documentation

## 2014-08-15 DIAGNOSIS — R112 Nausea with vomiting, unspecified: Secondary | ICD-10-CM | POA: Insufficient documentation

## 2014-08-15 DIAGNOSIS — Z87442 Personal history of urinary calculi: Secondary | ICD-10-CM | POA: Insufficient documentation

## 2014-08-15 DIAGNOSIS — Z791 Long term (current) use of non-steroidal anti-inflammatories (NSAID): Secondary | ICD-10-CM | POA: Insufficient documentation

## 2014-08-15 DIAGNOSIS — K2970 Gastritis, unspecified, without bleeding: Secondary | ICD-10-CM | POA: Insufficient documentation

## 2014-08-15 DIAGNOSIS — IMO0002 Reserved for concepts with insufficient information to code with codable children: Secondary | ICD-10-CM | POA: Insufficient documentation

## 2014-08-15 DIAGNOSIS — R569 Unspecified convulsions: Secondary | ICD-10-CM | POA: Insufficient documentation

## 2014-08-15 DIAGNOSIS — S0081XA Abrasion of other part of head, initial encounter: Secondary | ICD-10-CM

## 2014-08-15 LAB — URINALYSIS, ROUTINE W REFLEX MICROSCOPIC
Bilirubin Urine: NEGATIVE
GLUCOSE, UA: NEGATIVE mg/dL
Ketones, ur: NEGATIVE mg/dL
LEUKOCYTES UA: NEGATIVE
Nitrite: NEGATIVE
PH: 6.5 (ref 5.0–8.0)
Protein, ur: 30 mg/dL — AB
SPECIFIC GRAVITY, URINE: 1.02 (ref 1.005–1.030)
Urobilinogen, UA: 1 mg/dL (ref 0.0–1.0)

## 2014-08-15 LAB — CBC WITH DIFFERENTIAL/PLATELET
BASOS ABS: 0 10*3/uL (ref 0.0–0.1)
Basophils Absolute: 0 10*3/uL (ref 0.0–0.1)
Basophils Relative: 0 % (ref 0–1)
Basophils Relative: 0 % (ref 0–1)
Eosinophils Absolute: 0 10*3/uL (ref 0.0–0.7)
Eosinophils Absolute: 0 10*3/uL (ref 0.0–0.7)
Eosinophils Relative: 0 % (ref 0–5)
Eosinophils Relative: 0 % (ref 0–5)
HCT: 40.2 % (ref 39.0–52.0)
HEMATOCRIT: 40.1 % (ref 39.0–52.0)
HEMOGLOBIN: 14.1 g/dL (ref 13.0–17.0)
Hemoglobin: 13.9 g/dL (ref 13.0–17.0)
LYMPHS ABS: 1.7 10*3/uL (ref 0.7–4.0)
LYMPHS PCT: 44 % (ref 12–46)
LYMPHS PCT: 35 % (ref 12–46)
Lymphs Abs: 1.7 10*3/uL (ref 0.7–4.0)
MCH: 33.1 pg (ref 26.0–34.0)
MCH: 33.2 pg (ref 26.0–34.0)
MCHC: 35.2 g/dL (ref 30.0–36.0)
MCHC: 34.6 g/dL (ref 30.0–36.0)
MCV: 94.1 fL (ref 78.0–100.0)
MCV: 95.9 fL (ref 78.0–100.0)
MONOS PCT: 9 % (ref 3–12)
Monocytes Absolute: 0.3 10*3/uL (ref 0.1–1.0)
Monocytes Absolute: 0.3 10*3/uL (ref 0.1–1.0)
Monocytes Relative: 7 % (ref 3–12)
NEUTROS ABS: 1.8 10*3/uL (ref 1.7–7.7)
NEUTROS ABS: 2.8 10*3/uL (ref 1.7–7.7)
Neutrophils Relative %: 47 % (ref 43–77)
Neutrophils Relative %: 58 % (ref 43–77)
PLATELETS: 140 10*3/uL — AB (ref 150–400)
Platelets: 128 10*3/uL — ABNORMAL LOW (ref 150–400)
RBC: 4.26 MIL/uL (ref 4.22–5.81)
RBC: 4.19 MIL/uL — AB (ref 4.22–5.81)
RDW: 13.6 % (ref 11.5–15.5)
RDW: 13.7 % (ref 11.5–15.5)
WBC: 3.8 10*3/uL — AB (ref 4.0–10.5)
WBC: 4.8 10*3/uL (ref 4.0–10.5)

## 2014-08-15 LAB — BASIC METABOLIC PANEL
ANION GAP: 33 — AB (ref 5–15)
Anion gap: 20 — ABNORMAL HIGH (ref 5–15)
BUN: 14 mg/dL (ref 6–23)
BUN: 12 mg/dL (ref 6–23)
CHLORIDE: 93 meq/L — AB (ref 96–112)
CO2: 16 meq/L — AB (ref 19–32)
CO2: 24 mEq/L (ref 19–32)
CREATININE: 0.8 mg/dL (ref 0.50–1.35)
Calcium: 8.5 mg/dL (ref 8.4–10.5)
Calcium: 8.4 mg/dL (ref 8.4–10.5)
Chloride: 96 mEq/L (ref 96–112)
Creatinine, Ser: 0.93 mg/dL (ref 0.50–1.35)
GFR calc Af Amer: >90 mL/min (ref >90)
GFR calc non Af Amer: >90 mL/min (ref >90)
GLUCOSE: 155 mg/dL — AB (ref 70–99)
Glucose, Bld: 153 mg/dL — ABNORMAL HIGH (ref 70–99)
POTASSIUM: 3.3 meq/L — AB (ref 3.7–5.3)
Potassium: 3.6 mEq/L — ABNORMAL LOW (ref 3.7–5.3)
SODIUM: 142 meq/L (ref 137–147)
Sodium: 140 mEq/L (ref 137–147)

## 2014-08-15 LAB — LIPASE, BLOOD: Lipase: 61 U/L — ABNORMAL HIGH (ref 11–59)

## 2014-08-15 LAB — COMPREHENSIVE METABOLIC PANEL
ALBUMIN: 3.2 g/dL — AB (ref 3.5–5.2)
ALT: 49 U/L (ref 0–53)
AST: 267 U/L — ABNORMAL HIGH (ref 0–37)
Alkaline Phosphatase: 154 U/L — ABNORMAL HIGH (ref 39–117)
Anion gap: 13 (ref 5–15)
BUN: 15 mg/dL (ref 6–23)
CHLORIDE: 100 meq/L (ref 96–112)
CO2: 32 mEq/L (ref 19–32)
Calcium: 8.3 mg/dL — ABNORMAL LOW (ref 8.4–10.5)
Creatinine, Ser: 0.86 mg/dL (ref 0.50–1.35)
GFR calc Af Amer: >90 mL/min (ref >90)
GFR calc non Af Amer: >90 mL/min (ref >90)
GLUCOSE: 142 mg/dL — AB (ref 70–99)
Potassium: 3.4 mEq/L — ABNORMAL LOW (ref 3.7–5.3)
Sodium: 145 mEq/L (ref 137–147)
TOTAL PROTEIN: 6.8 g/dL (ref 6.0–8.3)
Total Bilirubin: 0.6 mg/dL (ref 0.3–1.2)

## 2014-08-15 LAB — URINE MICROSCOPIC-ADD ON

## 2014-08-15 LAB — CBG MONITORING, ED: GLUCOSE-CAPILLARY: 128 mg/dL — AB (ref 70–99)

## 2014-08-15 LAB — ETHANOL: Alcohol, Ethyl (B): 136 mg/dL — ABNORMAL HIGH (ref 0–11)

## 2014-08-15 MED ORDER — RANITIDINE HCL 150 MG PO CAPS: 150.0000 mg | ORAL_CAPSULE | Freq: Every day | ORAL | Status: DC

## 2014-08-15 MED ORDER — HYDROCODONE-ACETAMINOPHEN 5-325 MG PO TABS: 1.0000 | ORAL_TABLET | Freq: Four times a day (QID) | ORAL | Status: DC | PRN

## 2014-08-15 MED ORDER — HYDROMORPHONE HCL PF 1 MG/ML IJ SOLN
0.5000 mg | Freq: Once | INTRAMUSCULAR | Status: AC
  Administered 2014-08-15: 0.5 mg via INTRAVENOUS
  Filled 2014-08-15: qty 1

## 2014-08-15 MED ORDER — PHENYTOIN SODIUM EXTENDED 100 MG PO CAPS: 300.0000 mg | ORAL_CAPSULE | Freq: Every day | ORAL | Status: DC

## 2014-08-15 MED ORDER — LORAZEPAM 2 MG/ML IJ SOLN
1.0000 mg | Freq: Once | INTRAMUSCULAR | Status: AC
  Administered 2014-08-15: 1 mg via INTRAVENOUS
  Filled 2014-08-15: qty 1

## 2014-08-15 MED ORDER — LEVETIRACETAM 1000 MG PO TABS: 1000.0000 mg | ORAL_TABLET | Freq: Two times a day (BID) | ORAL | Status: DC

## 2014-08-15 MED ORDER — SODIUM CHLORIDE 0.9 % IV BOLUS (SEPSIS)
1000.0000 mL | Freq: Once | INTRAVENOUS | Status: AC
  Administered 2014-08-15: 1000 mL via INTRAVENOUS

## 2014-08-15 MED ORDER — LISINOPRIL 10 MG PO TABS
20.0000 mg | ORAL_TABLET | Freq: Once | ORAL | Status: AC
  Administered 2014-08-15: 20 mg via ORAL
  Filled 2014-08-15: qty 2

## 2014-08-15 MED ORDER — PANTOPRAZOLE SODIUM 40 MG IV SOLR
40.0000 mg | Freq: Once | INTRAVENOUS | Status: AC
  Administered 2014-08-15: 40 mg via INTRAVENOUS
  Filled 2014-08-15: qty 40

## 2014-08-15 MED ORDER — SODIUM CHLORIDE 0.9 % IV SOLN
1000.0000 mg | Freq: Once | INTRAVENOUS | Status: AC
  Administered 2014-08-15: 1000 mg via INTRAVENOUS
  Filled 2014-08-15: qty 20

## 2014-08-15 MED ORDER — ONDANSETRON HCL 4 MG/2ML IJ SOLN
4.0000 mg | Freq: Once | INTRAMUSCULAR | Status: AC
  Administered 2014-08-15: 4 mg via INTRAVENOUS
  Filled 2014-08-15: qty 2

## 2014-08-15 MED ORDER — LISINOPRIL 20 MG PO TABS: 20.0000 mg | ORAL_TABLET | Freq: Every day | ORAL | Status: DC

## 2014-08-15 MED ORDER — TETANUS-DIPHTH-ACELL PERTUSSIS 5-2.5-18.5 LF-MCG/0.5 IM SUSP
0.5000 mL | Freq: Once | INTRAMUSCULAR | Status: AC
  Administered 2014-08-15: 0.5 mL via INTRAMUSCULAR
  Filled 2014-08-15: qty 0.5

## 2014-08-15 MED ORDER — LEVETIRACETAM 500 MG PO TABS
1000.0000 mg | ORAL_TABLET | Freq: Once | ORAL | Status: AC
  Administered 2014-08-15: 1000 mg via ORAL
  Filled 2014-08-15: qty 2

## 2014-08-15 NOTE — ED Provider Notes (Signed)
CSN: [id number]     Arrival date & time 08/15/14  1307 History   First MD Initiated Contact with Patient 08/15/14 1309     Chief Complaint  Patient presents with  . Seizures     (Consider location/radiation/quality/duration/timing/severity/associated sxs/prior Treatment) HPI 48 year old male presents after having a seizure at the [facility]. EMS reports that during his seizure he fell and hit his head. The patient appears postictal but is able to answer most questions. He states that he has a left frontal headache where he hit his head. Does not remember a seizure. Is not last time he took his seizure medicines. He drink alcohol "nearly every day" and last drank last night. He was seen here twice since last night for abdominal pain. The patient denies abdominal pain at this time. He still has his discharge paperwork and unfilled prescriptions in his pocket. When asked about his pain score he states "I don't know". Unsure of his last tetanus update.  Past Medical History  Diagnosis Date  . Pancreatitis, acute     First episode earlier in 2012, hospitalized again in 02/2012 and 09/2012  . Hypertension     noncompliance  . Hyperlipidemia     noncompliance  . Lung collapse     h/o  . ETOH abuse   . Cocaine abuse   . DTs (delirium tremens)     history of  . Tobacco abuse   . Seizure disorder 07/17/2013  . Noncompliance 07/17/2013  . Traumatic subdural hematoma 06/15/2013  . 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 Open skull fracture 06/15/2013  . Polysubstance abuse     etoh, cocaine  . Nephrolithiasis   . Seizures    Past Surgical History  Procedure Laterality Date  . Mandible fracture surgery  2006  . Left axillary      surgery for deep laceration   Family History  Problem Relation Age of Onset  . Diabetes type II Mother   . Liver disease Neg Hx   . Colon cancer Neg Hx   . GI problems Neg Hx    History  Substance Use Topics  . Smoking status: Current Every  Day Smoker -- 1.00 packs/day    Types: Cigarettes  . Smokeless tobacco: Not on file  . Alcohol Use: Yes     Comment: 1/5 wine per day    Review of Systems  Gastrointestinal: Negative for vomiting and abdominal pain.  Neurological: Positive for seizures and headaches. Negative for weakness.  All other systems reviewed and are negative.     Allergies  Review of patient's allergies indicates no known allergies.  Home Medications   Prior to Admission medications   Medication Sig Start Date End Date Taking? Authorizing Provider  HYDROcodone-acetaminophen (NORCO/VICODIN) 5-325 MG per tablet Take 1 tablet by mouth every 6 (six) hours as needed. 08/15/14   Benny Lennert, MD  ibuprofen (ADVIL,MOTRIN) 800 MG tablet Take 800 mg by mouth 3 (three) times daily as needed (pain).    Historical Provider, MD  lisinopril (PRINIVIL,ZESTRIL) 20 MG tablet Take 1 tablet (20 mg total) by mouth daily. 06/23/14   Benny Lennert, MD  lisinopril (PRINIVIL,ZESTRIL) 20 MG tablet Take 1 tablet (20 mg total) by mouth daily. 08/15/14   Benny Lennert, MD  phenytoin (DILANTIN) 100 MG ER capsule Take 300 mg by mouth at bedtime.    Historical Provider, MD  ranitidine (ZANTAC) 150 MG capsule Take 1 capsule (150 mg total) by mouth daily. 08/15/14   Tandy Gaw  Zammit, MD   BP 178/131  Pulse 118  Temp(Src) 98.5 F (36.9 C) (Oral)  Resp 15  SpO2 98% Physical Exam  Nursing note and vitals reviewed. Constitutional: He is oriented to person, place, and time. He appears well-developed and well-nourished.  Appears post ictal  HENT:  Head: Normocephalic.    Right Ear: External ear normal.  Left Ear: External ear normal.  Nose: Nose normal.  Eyes: EOM are normal. Pupils are equal, round, and reactive to light. Right eye exhibits no discharge. Left eye exhibits no discharge.  Neck: Neck supple.  Cardiovascular: Regular rhythm, normal heart sounds and intact distal pulses.  Tachycardia present.   Pulmonary/Chest:  Effort normal.  Abdominal: Soft. There is no tenderness.  Musculoskeletal: He exhibits no edema.  Neurological: He is alert and oriented to person, place, and time.  Normal strength in all 4 extremities. CN 2-12 grossly intact  Skin: Skin is warm and dry.    ED Course  Procedures (including critical care time) Labs Review Labs Reviewed  CBC WITH DIFFERENTIAL - Abnormal; Notable for the following:    RBC 4.19 (*)    Platelets 140 (*)    All other components within normal limits  BASIC METABOLIC PANEL - Abnormal; Notable for the following:    Potassium 3.3 (*)    Chloride 93 (*)    CO2 16 (*)    Glucose, Bld 155 (*)    Anion gap 33 (*)    All other components within normal limits  URINALYSIS, ROUTINE W REFLEX MICROSCOPIC - Abnormal; Notable for the following:    Hgb urine dipstick TRACE (*)    Protein, ur 30 (*)    All other components within normal limits  BASIC METABOLIC PANEL - Abnormal; Notable for the following:    Potassium 3.6 (*)    Glucose, Bld 153 (*)    Anion gap 20 (*)    All other components within normal limits  CBG MONITORING, ED - Abnormal; Notable for the following:    Glucose-Capillary 128 (*)    All other components within normal limits  URINE MICROSCOPIC-ADD ON    Imaging Review Ct Head Wo Contrast  08/15/2014   CLINICAL DATA:  Seizures, fell. History of ETOH and substance abuse with previous head trauma.  EXAM: CT HEAD WITHOUT CONTRAST  TECHNIQUE: Contiguous axial images were obtained from the base of the skull through the vertex without contrast.  COMPARISON:  06/23/2014  FINDINGS: No evidence for acute infarction, hemorrhage, mass lesion, hydrocephalus, or extra-axial fluid. Generalized atrophy. Hypoattenuation white matter suggesting chronic microvascular ischemic change. There are BILATERAL areas of frontal encephalomalacia suggesting previous trauma. Calvarium intact. LEFT frontal scalp hematoma. No sinus air-fluid level. Suspected chronic medial  blowout fractures. No mastoid fluid. Compared with priors, the previously noted subarachnoid blood has resolved.  IMPRESSION: No acute intracranial abnormality. Bifrontal encephalomalacia. Resolved subarachnoid blood. Premature for age atrophy.  LEFT frontal scalp hematoma without skull fracture or intracranial hemorrhage.   Electronically Signed   By: Davonna Belling M.D.   On: 08/15/2014 13:56   Dg Abd Acute W/chest  08/15/2014   CLINICAL DATA:  Nausea and vomiting for 1 day.  EXAM: ACUTE ABDOMEN SERIES (ABDOMEN 2 VIEW & CHEST 1 VIEW)  COMPARISON:  05/19/2014 chest x-ray  FINDINGS: Normal heart size. Mild aortic tortuosity. There is no edema, consolidation, effusion, or pneumothorax. Healing lateral left eighth and ninth rib fractures.  There are few small bowel fluid levels, but the overall bowel gas pattern is nonobstructive.  No gas dilated bowel. No pneumoperitoneum. No concerning intra-abdominal mass affect. Epigastric coarse calcifications correlating with chronic pancreatitis.  IMPRESSION: 1. No evidence of bowel obstruction or perforation. 2. Chronic pancreatitis. 3. No acute intrathoracic findings.   Electronically Signed   By: Tiburcio PeaJonathan  Watts M.D.   On: 08/15/2014 04:25     EKG Interpretation   Date/Time:  Monday August 15 2014 13:12:52 EDT Ventricular Rate:  118 PR Interval:  146 QRS Duration: 96 QT Interval:  351 QTC Calculation: 492 R Axis:   61 Text Interpretation:  Sinus tachycardia Borderline repolarization  abnormality Borderline prolonged QT interval No significant change since  last tracing Confirmed by Achille Xiang  MD, Shyane Fossum ([id number]) on 08/15/2014 2:03:48  PM      MDM   Final diagnoses:  Seizure  Traumatic hematoma of forehead, initial encounter  Forehead abrasion, initial encounter    Patient's tetanus was updated. No signs of serious acute intracranial injury. His mental status improved and he appeared normal. Is able to and around the ED without difficulty. Initial BMP  showed an acidosis with anion gap. This is almost undoubtedly due to seizures as a repeat BMP shows no collection or dysfunction or acidosis. I will discharge patient with refills of his antiepileptics and stressed the importance of filling these, though he does have a strong history of noncompliance.    Audree CamelScott T Anuj Summons, MD 08/15/14 2119

## 2014-08-15 NOTE — ED Provider Notes (Signed)
CSN: [id number]     Arrival date & time 08/15/14  0220 History   First MD Initiated Contact with Patient 08/15/14 0243     Chief Complaint  Patient presents with  . Nausea  . Emesis     (Consider location/radiation/quality/duration/timing/severity/associated sxs/prior Treatment) Patient is a 48 y.o. male presenting with abdominal pain. The history is provided by the patient (the pt complains of epigastric pain.  he is still drinking alcohol).  Abdominal Pain Pain location:  Epigastric Pain quality: aching   Pain radiates to:  Does not radiate Pain severity:  Moderate Onset quality:  Sudden Timing:  Constant Progression:  Worsening Associated symptoms: no chest pain, no cough, no diarrhea, no fatigue and no hematuria     Past Medical History  Diagnosis Date  . Pancreatitis, acute     First episode earlier in 2012, hospitalized again in 02/2012 and 09/2012  . Hypertension     noncompliance  . Hyperlipidemia     noncompliance  . Lung collapse     h/o  . ETOH abuse   . Cocaine abuse   . DTs (delirium tremens)     history of  . Tobacco abuse   . Seizure disorder 07/17/2013  . Noncompliance 07/17/2013  . Traumatic subdural hematoma 06/15/2013  . 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 Open skull fracture 06/15/2013  . Polysubstance abuse     etoh, cocaine  . Nephrolithiasis   . Seizures    Past Surgical History  Procedure Laterality Date  . Mandible fracture surgery  2006  . Left axillary      surgery for deep laceration   Family History  Problem Relation Age of Onset  . Diabetes type II Mother   . Liver disease Neg Hx   . Colon cancer Neg Hx   . GI problems Neg Hx    History  Substance Use Topics  . Smoking status: Current Every Day Smoker -- 1.00 packs/day    Types: Cigarettes  . Smokeless tobacco: Not on file  . Alcohol Use: Yes     Comment: 1/5 wine per day    Review of Systems  Constitutional: Negative for appetite change and fatigue.  HENT:  Negative for congestion, ear discharge and sinus pressure.   Eyes: Negative for discharge.  Respiratory: Negative for cough.   Cardiovascular: Negative for chest pain.  Gastrointestinal: Positive for abdominal pain. Negative for diarrhea.  Genitourinary: Negative for frequency and hematuria.  Musculoskeletal: Negative for back pain.  Skin: Negative for rash.  Neurological: Negative for seizures and headaches.  Psychiatric/Behavioral: Negative for hallucinations.      Allergies  Review of patient's allergies indicates no known allergies.  Home Medications   Prior to Admission medications   Medication Sig Start Date End Date Taking? Authorizing Provider  HYDROcodone-acetaminophen (NORCO/VICODIN) 5-325 MG per tablet Take 1 tablet by mouth every 6 (six) hours as needed. 08/15/14   Benny LennertJoseph L Ichiro Chesnut, MD  ibuprofen (ADVIL,MOTRIN) 800 MG tablet Take 800 mg by mouth 3 (three) times daily as needed (pain).    Historical Provider, MD  lisinopril (PRINIVIL,ZESTRIL) 20 MG tablet Take 1 tablet (20 mg total) by mouth daily. 06/23/14   Benny LennertJoseph L Chauncey Bruno, MD  lisinopril (PRINIVIL,ZESTRIL) 20 MG tablet Take 1 tablet (20 mg total) by mouth daily. 08/15/14   Benny LennertJoseph L Jayma Volpi, MD  phenytoin (DILANTIN) 100 MG ER capsule Take 300 mg by mouth at bedtime.    Historical Provider, MD  ranitidine (ZANTAC) 150 MG capsule Take 1 capsule (150  mg total) by mouth daily. 08/15/14   Benny Lennert, MD   BP 202/136  Pulse 105  Temp(Src) 98.1 F (36.7 C) (Oral)  Resp 20  Ht 6' (1.829 m)  Wt 160 lb (72.576 kg)  BMI 21.70 kg/m2  SpO2 98% Physical Exam  Constitutional: He is oriented to person, place, and time. He appears well-developed.  HENT:  Head: Normocephalic.  Eyes: Conjunctivae and EOM are normal. No scleral icterus.  Neck: Neck supple. No thyromegaly present.  Cardiovascular: Normal rate and regular rhythm.  Exam reveals no gallop and no friction rub.   No murmur heard. Pulmonary/Chest: No stridor. He has no  wheezes. He has no rales. He exhibits no tenderness.  Abdominal: He exhibits no distension. There is tenderness. There is no rebound.  Musculoskeletal: Normal range of motion. He exhibits no edema.  Lymphadenopathy:    He has no cervical adenopathy.  Neurological: He is oriented to person, place, and time. He exhibits normal muscle tone. Coordination normal.  Skin: No rash noted. No erythema.  Psychiatric: He has a normal mood and affect. His behavior is normal.    ED Course  Procedures (including critical care time) Labs Review Labs Reviewed  CBC WITH DIFFERENTIAL - Abnormal; Notable for the following:    WBC 3.8 (*)    Platelets 128 (*)    All other components within normal limits  COMPREHENSIVE METABOLIC PANEL - Abnormal; Notable for the following:    Potassium 3.4 (*)    Glucose, Bld 142 (*)    Calcium 8.3 (*)    Albumin 3.2 (*)    AST 267 (*)    Alkaline Phosphatase 154 (*)    All other components within normal limits  LIPASE, BLOOD - Abnormal; Notable for the following:    Lipase 61 (*)    All other components within normal limits  ETHANOL - Abnormal; Notable for the following:    Alcohol, Ethyl (B) 136 (*)    All other components within normal limits    Imaging Review Dg Abd Acute W/chest  08/15/2014   CLINICAL DATA:  Nausea and vomiting for 1 day.  EXAM: ACUTE ABDOMEN SERIES (ABDOMEN 2 VIEW & CHEST 1 VIEW)  COMPARISON:  05/19/2014 chest x-ray  FINDINGS: Normal heart size. Mild aortic tortuosity. There is no edema, consolidation, effusion, or pneumothorax. Healing lateral left eighth and ninth rib fractures.  There are few small bowel fluid levels, but the overall bowel gas pattern is nonobstructive. No gas dilated bowel. No pneumoperitoneum. No concerning intra-abdominal mass affect. Epigastric coarse calcifications correlating with chronic pancreatitis.  IMPRESSION: 1. No evidence of bowel obstruction or perforation. 2. Chronic pancreatitis. 3. No acute intrathoracic  findings.   Electronically Signed   By: Tiburcio Pea M.D.   On: 08/15/2014 04:25     EKG Interpretation None      MDM   Final diagnoses:  Gastritis  Alcohol abuse  Essential hypertension    Gastritis,  Htn,  tx with zantac, vicodin and lisinopril    Benny Lennert, MD 08/15/14 [phone]

## 2014-08-15 NOTE — Discharge Instructions (Signed)
It is very important that your take your seizure medicines as prescribed. Alcohol can also contribute to your seizures, I advise you seek medical help to help cut back or stop alcohol   Abrasion An abrasion is a cut or scrape of the skin. Abrasions do not extend through all layers of the skin and most heal within 10 days. It is important to care for your abrasion properly to prevent infection. CAUSES  Most abrasions are caused by falling on, or gliding across, the ground or other surface. When your skin rubs on something, the outer and inner layer of skin rubs off, causing an abrasion. DIAGNOSIS  Your caregiver will be able to diagnose an abrasion during a physical exam.  TREATMENT  Your treatment depends on how large and deep the abrasion is. Generally, your abrasion will be cleaned with water and a mild soap to remove any dirt or debris. An antibiotic ointment may be put over the abrasion to prevent an infection. A bandage (dressing) may be wrapped around the abrasion to keep it from getting dirty.  You may need a tetanus shot if:  You cannot remember when you had your last tetanus shot.  You have never had a tetanus shot.  The injury broke your skin. If you get a tetanus shot, your arm may swell, get red, and feel warm to the touch. This is common and not a problem. If you need a tetanus shot and you choose not to have one, there is a rare chance of getting tetanus. Sickness from tetanus can be serious.  HOME CARE INSTRUCTIONS   If a dressing was applied, change it at least once a day or as directed by your caregiver. If the bandage sticks, soak it off with warm water.   Wash the area with water and a mild soap to remove all the ointment 2 times a day. Rinse off the soap and pat the area dry with a clean towel.   Reapply any ointment as directed by your caregiver. This will help prevent infection and keep the bandage from sticking. Use gauze over the wound and under the dressing to help  keep the bandage from sticking.   Change your dressing right away if it becomes wet or dirty.   Only take over-the-counter or prescription medicines for pain, discomfort, or fever as directed by your caregiver.   Follow up with your caregiver within 24-48 hours for a wound check, or as directed. If you were not given a wound-check appointment, look closely at your abrasion for redness, swelling, or pus. These are signs of infection. SEEK IMMEDIATE MEDICAL CARE IF:   You have increasing pain in the wound.   You have redness, swelling, or tenderness around the wound.   You have pus coming from the wound.   You have a fever or persistent symptoms for more than 2-3 days.  You have a fever and your symptoms suddenly get worse.  You have a bad smell coming from the wound or dressing.  MAKE SURE YOU:   Understand these instructions.  Will watch your condition.  Will get help right away if you are not doing well or get worse. Document Released: 09/25/2005 Document Revised: 12/02/2012 Document Reviewed: 11/19/2011 [facility] Patient Information 2015 [place]. This information is not intended to replace advice given to you by your health care provider. Make sure you discuss any questions you have with your health care provider.   Head Injury You have received a head injury. It does  not appear serious at this time. Headaches and vomiting are common following head injury. It should be easy to awaken from sleeping. Sometimes it is necessary for you to stay in the emergency department for a while for observation. Sometimes admission to the hospital may be needed. After injuries such as yours, most problems occur within the first 24 hours, but side effects may occur up to 7-10 days after the injury. It is important for you to carefully monitor your condition and contact your health care provider or seek immediate medical care if there is a change in your condition. WHAT ARE THE TYPES OF  HEAD INJURIES? Head injuries can be as minor as a bump. Some head injuries can be more severe. More severe head injuries include:  A jarring injury to the brain (concussion).  A bruise of the brain (contusion). This mean there is bleeding in the brain that can cause swelling.  A cracked skull (skull fracture).  Bleeding in the brain that collects, clots, and forms a bump (hematoma). WHAT CAUSES A HEAD INJURY? A serious head injury is most likely to happen to someone who is in a car wreck and is not wearing a seat belt. Other causes of major head injuries include bicycle or motorcycle accidents, sports injuries, and falls. HOW ARE HEAD INJURIES DIAGNOSED? A complete history of the event leading to the injury and your current symptoms will be helpful in diagnosing head injuries. Many times, pictures of the brain, such as CT or MRI are needed to see the extent of the injury. Often, an overnight hospital stay is necessary for observation.  WHEN SHOULD I SEEK IMMEDIATE MEDICAL CARE?  You should get help right away if:  You have confusion or drowsiness.  You feel sick to your stomach (nauseous) or have continued, forceful vomiting.  You have dizziness or unsteadiness that is getting worse.  You have severe, continued headaches not relieved by medicine. Only take over-the-counter or prescription medicines for pain, fever, or discomfort as directed by your health care provider.  You do not have normal function of the arms or legs or are unable to walk.  You notice changes in the black spots in the center of the colored part of your eye (pupil).  You have a clear or bloody fluid coming from your nose or ears.  You have a loss of vision. During the next 24 hours after the injury, you must stay with someone who can watch you for the warning signs. This person should contact local emergency services (911 in the [place] if you have seizures, you become unconscious, or you are unable to wake up. HOW  CAN I PREVENT A HEAD INJURY IN THE FUTURE? The most important factor for preventing major head injuries is avoiding motor vehicle accidents. To minimize the potential for damage to your head, it is crucial to wear seat belts while riding in motor vehicles. Wearing helmets while bike riding and playing collision sports (like football) is also helpful. Also, avoiding dangerous activities around the house will further help reduce your risk of head injury.  WHEN CAN I RETURN TO NORMAL ACTIVITIES AND ATHLETICS? You should be reevaluated by your health care provider before returning to these activities. If you have any of the following symptoms, you should not return to activities or contact sports until 1 week after the symptoms have stopped:  Persistent headache.  Dizziness or vertigo.  Poor attention and concentration.  Confusion.  Memory problems.  Nausea or vomiting.  Fatigue or  tire easily.  Irritability.  Intolerant of bright lights or loud noises.  Anxiety or depression.  Disturbed sleep. MAKE SURE YOU:   Understand these instructions.  Will watch your condition.  Will get help right away if you are not doing well or get worse. Document Released: 12/16/2005 Document Revised: 12/21/2013 Document Reviewed: 08/23/2013 [facility] Patient Information 2015 [place], [place]. This information is not intended to replace advice given to you by your health care provider. Make sure you discuss any questions you have with your health care provider.    Seizure, Adult A seizure is abnormal electrical activity in the brain. Seizures usually last from 30 seconds to 2 minutes. There are various types of seizures. Before a seizure, you may have a warning sensation (aura) that a seizure is about to occur. An aura may include the following symptoms:   Fear or anxiety.  Nausea.  Feeling like the room is spinning (vertigo).  Vision changes, such as seeing flashing lights or spots. Common  symptoms during a seizure include:  A change in attention or behavior (altered mental status).  Convulsions with rhythmic jerking movements.  Drooling.  Rapid eye movements.  Grunting.  Loss of bladder and bowel control.  Bitter taste in the mouth.  Tongue biting. After a seizure, you may feel confused and sleepy. You may also have an injury resulting from convulsions during the seizure. HOME CARE INSTRUCTIONS   If you are given medicines, take them exactly as prescribed by your health care provider.  Keep all follow-up appointments as directed by your health care provider.  Do not swim or drive or engage in risky activity during which a seizure could cause further injury to you or others until your health care provider says it is OK.  Get adequate rest.  Teach friends and family what to do if you have a seizure. They should:  Lay you on the ground to prevent a fall.  Put a cushion under your head.  Loosen any tight clothing around your neck.  Turn you on your side. If vomiting occurs, this helps keep your airway clear.  Stay with you until you recover.  Know whether or not you need emergency care. SEEK IMMEDIATE MEDICAL CARE IF:  The seizure lasts longer than 5 minutes.  The seizure is severe or you do not wake up immediately after the seizure.  You have an altered mental status after the seizure.  You are having more frequent or worsening seizures. Someone should drive you to the emergency department or call local emergency services (911 in [place] MAKE SURE YOU:  Understand these instructions.  Will watch your condition.  Will get help right away if you are not doing well or get worse. Document Released: 12/13/2000 Document Revised: 10/06/2013 Document Reviewed: 07/28/2013 [facility] Patient Information 2015 [place], [place]. This information is not intended to replace advice given to you by your health care provider. Make sure you discuss any questions you  have with your health care provider.

## 2014-08-15 NOTE — ED Notes (Signed)
Pt c/o n/v with abdominal pain x 4 days; pt states he has hx of trouble with his pancreas

## 2014-08-15 NOTE — ED Notes (Signed)
[facility] and [facility] and [facility] head.  Pt has wounds to anterior forehead. Pt denies any dizziness.

## 2014-08-15 NOTE — ED Notes (Addendum)
Per EMS: Pt reports having a [facility] and [facility] and [facility] his head.  Pt has wounds to anterior forehead. Pt denies any dizziness.

## 2014-08-15 NOTE — Discharge Instructions (Signed)
Take your bp med and stop drinking alcohol.  All your prescriptions a $4 at [facility].

## 2014-08-15 NOTE — ED Notes (Signed)
Pt seen trying to leave, pt verbally redirected to room, EDP at bedside, pt states he will stay for another blood draw.

## 2014-08-15 NOTE — ED Notes (Signed)
Pt alert & oriented x4, stable gait. Patient given discharge instructions, paperwork & prescription(s). Patient  instructed to stop at the registration desk to finish any additional paperwork. Patient verbalized understanding. Pt left department w/ no further questions. 

## 2014-08-15 NOTE — ED Notes (Signed)
Pt has gone over to xray 

## 2014-08-15 NOTE — ED Notes (Signed)
Pt asking for something for pain after coming back from xray; Dr. Estell HarpinZammit informed of pt's request and pt informed that EDP is aware of his pain.

## 2014-08-15 NOTE — ED Notes (Signed)
Pt attempting to leave, IV DC, pt awaiting papers, Louie CasaGeneva RN case worker gave pt information on how to get help with his medicines and his medical treatment.

## 2014-08-17 ENCOUNTER — Emergency Department (HOSPITAL_COMMUNITY)
Admission: EM | Admit: 2014-08-17 | Discharge: 2014-08-17 | Disposition: A | Discharge status: Home / Self Care | Payer: Self-pay | Attending: Emergency Medicine | Admitting: Emergency Medicine

## 2014-08-17 ENCOUNTER — Encounter (HOSPITAL_COMMUNITY): Payer: Self-pay | Admitting: Emergency Medicine

## 2014-08-17 DIAGNOSIS — F1010 Alcohol abuse, uncomplicated: Secondary | ICD-10-CM | POA: Insufficient documentation

## 2014-08-17 DIAGNOSIS — F17200 Nicotine dependence, unspecified, uncomplicated: Secondary | ICD-10-CM | POA: Insufficient documentation

## 2014-08-17 DIAGNOSIS — R109 Unspecified abdominal pain: Secondary | ICD-10-CM | POA: Insufficient documentation

## 2014-08-17 DIAGNOSIS — Z8639 Personal history of other endocrine, nutritional and metabolic disease: Secondary | ICD-10-CM | POA: Insufficient documentation

## 2014-08-17 DIAGNOSIS — R079 Chest pain, unspecified: Secondary | ICD-10-CM | POA: Insufficient documentation

## 2014-08-17 DIAGNOSIS — R569 Unspecified convulsions: Secondary | ICD-10-CM

## 2014-08-17 DIAGNOSIS — R51 Headache: Secondary | ICD-10-CM | POA: Insufficient documentation

## 2014-08-17 DIAGNOSIS — G40909 Epilepsy, unspecified, not intractable, without status epilepticus: Secondary | ICD-10-CM | POA: Insufficient documentation

## 2014-08-17 DIAGNOSIS — E876 Hypokalemia: Secondary | ICD-10-CM | POA: Insufficient documentation

## 2014-08-17 DIAGNOSIS — Z8659 Personal history of other mental and behavioral disorders: Secondary | ICD-10-CM | POA: Insufficient documentation

## 2014-08-17 DIAGNOSIS — Z862 Personal history of diseases of the blood and blood-forming organs and certain disorders involving the immune mechanism: Secondary | ICD-10-CM | POA: Insufficient documentation

## 2014-08-17 DIAGNOSIS — Z87442 Personal history of urinary calculi: Secondary | ICD-10-CM | POA: Insufficient documentation

## 2014-08-17 DIAGNOSIS — I10 Essential (primary) hypertension: Secondary | ICD-10-CM | POA: Insufficient documentation

## 2014-08-17 DIAGNOSIS — Z87828 Personal history of other (healed) physical injury and trauma: Secondary | ICD-10-CM | POA: Insufficient documentation

## 2014-08-17 DIAGNOSIS — R1930 Abdominal rigidity, unspecified site: Secondary | ICD-10-CM | POA: Insufficient documentation

## 2014-08-17 DIAGNOSIS — F10920 Alcohol use, unspecified with intoxication, uncomplicated: Secondary | ICD-10-CM

## 2014-08-17 DIAGNOSIS — Z79899 Other long term (current) drug therapy: Secondary | ICD-10-CM | POA: Insufficient documentation

## 2014-08-17 DIAGNOSIS — Z8719 Personal history of other diseases of the digestive system: Secondary | ICD-10-CM | POA: Insufficient documentation

## 2014-08-17 LAB — CBC WITH DIFFERENTIAL/PLATELET
BASOS PCT: 0 % (ref 0–1)
Basophils Absolute: 0 10*3/uL (ref 0.0–0.1)
EOS PCT: 1 % (ref 0–5)
Eosinophils Absolute: 0 10*3/uL (ref 0.0–0.7)
HCT: 38 % — ABNORMAL LOW (ref 39.0–52.0)
HEMOGLOBIN: 13.6 g/dL (ref 13.0–17.0)
LYMPHS ABS: 2.3 10*3/uL (ref 0.7–4.0)
Lymphocytes Relative: 49 % — ABNORMAL HIGH (ref 12–46)
MCH: 32.9 pg (ref 26.0–34.0)
MCHC: 35.8 g/dL (ref 30.0–36.0)
MCV: 92 fL (ref 78.0–100.0)
Monocytes Absolute: 0.4 10*3/uL (ref 0.1–1.0)
Monocytes Relative: 8 % (ref 3–12)
Neutro Abs: 1.9 10*3/uL (ref 1.7–7.7)
Neutrophils Relative %: 42 % — ABNORMAL LOW (ref 43–77)
PLATELETS: 102 10*3/uL — AB (ref 150–400)
RBC: 4.13 MIL/uL — AB (ref 4.22–5.81)
RDW: 13.9 % (ref 11.5–15.5)
WBC: 4.6 10*3/uL (ref 4.0–10.5)

## 2014-08-17 LAB — COMPREHENSIVE METABOLIC PANEL
ALT: 22 U/L (ref 0–53)
ANION GAP: 21 — AB (ref 5–15)
AST: 23 U/L (ref 0–37)
Albumin: 3.1 g/dL — ABNORMAL LOW (ref 3.5–5.2)
Alkaline Phosphatase: 134 U/L — ABNORMAL HIGH (ref 39–117)
BILIRUBIN TOTAL: 0.3 mg/dL (ref 0.3–1.2)
BUN: 11 mg/dL (ref 6–23)
CALCIUM: 8.7 mg/dL (ref 8.4–10.5)
CHLORIDE: 94 meq/L — AB (ref 96–112)
CO2: 20 mEq/L (ref 19–32)
Creatinine, Ser: 0.97 mg/dL (ref 0.50–1.35)
GFR calc non Af Amer: >90 mL/min (ref >90)
Glucose, Bld: 156 mg/dL — ABNORMAL HIGH (ref 70–99)
Potassium: 2.7 mEq/L — CL (ref 3.7–5.3)
Sodium: 135 mEq/L — ABNORMAL LOW (ref 137–147)
Total Protein: 7.1 g/dL (ref 6.0–8.3)

## 2014-08-17 LAB — ETHANOL: Alcohol, Ethyl (B): 302 mg/dL — ABNORMAL HIGH (ref 0–11)

## 2014-08-17 LAB — LIPASE, BLOOD: Lipase: 88 U/L — ABNORMAL HIGH (ref 11–59)

## 2014-08-17 MED ORDER — ONDANSETRON 4 MG PO TBDP
4.0000 mg | ORAL_TABLET | Freq: Once | ORAL | Status: AC
  Administered 2014-08-17: 4 mg via ORAL
  Filled 2014-08-17: qty 1

## 2014-08-17 MED ORDER — PHENYTOIN SODIUM EXTENDED 100 MG PO CAPS
300.0000 mg | ORAL_CAPSULE | Freq: Once | ORAL | Status: AC
  Administered 2014-08-17: 300 mg via ORAL
  Filled 2014-08-17: qty 3

## 2014-08-17 MED ORDER — POTASSIUM CHLORIDE CRYS ER 20 MEQ PO TBCR
40.0000 meq | EXTENDED_RELEASE_TABLET | Freq: Once | ORAL | Status: AC
  Administered 2014-08-17: 40 meq via ORAL
  Filled 2014-08-17: qty 2

## 2014-08-17 MED ORDER — POTASSIUM CHLORIDE ER 10 MEQ PO TBCR: 10.0000 meq | EXTENDED_RELEASE_TABLET | Freq: Two times a day (BID) | ORAL | Status: DC

## 2014-08-17 NOTE — ED Notes (Signed)
Patient watching TV at this time no distress noted.

## 2014-08-17 NOTE — ED Provider Notes (Signed)
CSN: [id number]     Arrival date & time 08/17/14  1644 History  This chart was scribed for Paul Mulders, MD by Bronson Curb, ED Scribe. This patient was seen in room APA06/APA06 and the patient's care was started at 6:19 PM.    Chief Complaint  Patient presents with  . Pancreatitis  . Alcohol Intoxication      Patient is a 48 y.o. male presenting with intoxication and headaches. The history is provided by the patient. No language interpreter was used.  Alcohol Intoxication Associated symptoms include chest pain, abdominal pain and headaches. Pertinent negatives include no shortness of breath.  Headache Pain location:  Generalized Radiates to:  Does not radiate Onset quality:  Sudden Timing:  Constant Progression:  Unchanged Relieved by:  Nothing Worsened by:  Nothing tried Ineffective treatments:  None tried Associated symptoms: abdominal pain, nausea and vomiting   Associated symptoms: no back pain, no congestion, no diarrhea, no fever and no sore throat     HPI Comments: Paul [name] is a 48 y.o. male who presents to the Emergency Department complaining of constant, generalized HA and tongue biting the started PTA. Patient was seen here 2 days ago for seizures and was prscribed medication (Dilantin), but has been unable to get this filled, and reports has been without seizure medication since 08/15/2014  Patient receives his seizure medication from [place] in [place]. Patient complains of HA and states he has been "biting his tongue". Patient states his tonque feels numb and reports he almost "bit it in half". Patient is concerned that this is the begginning og another seizure. There is associated CP, abdominal pain, nausea, emesis, and dysuria (onset 1 week ago). He reports the CP and abdominal pain are chronic conditions, and his primary concern is the HA and tongue biting. Patient also reports itching to his bilateral feet and states this is related to the  beginnings of a seizure. He denies fever, chills, congestion, rhinorrhea, sore throat, leg swelling, SOB, back pain, and rash. Patient has history of pancreatitis, HTN, hyperlipidemia, seizures, and EtOH abuse.   Past Medical History  Diagnosis Date  . Pancreatitis, acute     First episode earlier in 2012, hospitalized again in 02/2012 and 09/2012  . Hypertension     noncompliance  . Hyperlipidemia     noncompliance  . Lung collapse     h/o  . ETOH abuse   . Cocaine abuse   . DTs (delirium tremens)     history of  . Tobacco abuse   . Seizure disorder 07/17/2013  . Noncompliance 07/17/2013  . Traumatic subdural hematoma 06/15/2013  . 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 Open skull fracture 06/15/2013  . Polysubstance abuse     etoh, cocaine  . Nephrolithiasis   . Seizures    Past Surgical History  Procedure Laterality Date  . Mandible fracture surgery  2006  . Left axillary      surgery for deep laceration   Family History  Problem Relation Age of Onset  . Diabetes type II Mother   . Liver disease Neg Hx   . Colon cancer Neg Hx   . GI problems Neg Hx    History  Substance Use Topics  . Smoking status: Current Every Day Smoker -- 1.00 packs/day    Types: Cigarettes  . Smokeless tobacco: Not on file  . Alcohol Use: Yes     Comment: 1/5 wine per day    Review of Systems  Constitutional: Negative  for fever and chills.  HENT: Negative for congestion, rhinorrhea and sore throat.   Eyes: Negative for visual disturbance.  Respiratory: Negative for shortness of breath.   Cardiovascular: Positive for chest pain. Negative for leg swelling.  Gastrointestinal: Positive for nausea, vomiting and abdominal pain. Negative for diarrhea.  Genitourinary: Positive for dysuria.  Musculoskeletal: Negative for back pain.  Skin: Negative for rash.  Neurological: Positive for headaches.  Hematological: Does not bruise/bleed easily.  Psychiatric/Behavioral: Negative for  confusion.      Allergies  Review of patient's allergies indicates no known allergies.  Home Medications   Prior to Admission medications   Medication Sig Start Date End Date Taking? Authorizing Provider  ibuprofen (ADVIL,MOTRIN) 800 MG tablet Take 800 mg by mouth 3 (three) times daily as needed (pain).   Yes Historical Provider, MD  levETIRAcetam (KEPPRA) 1000 MG tablet Take 1 tablet (1,000 mg total) by mouth 2 (two) times daily. 08/15/14  Yes Audree CamelScott T Goldston, MD  lisinopril (PRINIVIL,ZESTRIL) 40 MG tablet Take 40 mg by mouth daily.   Yes Historical Provider, MD  HYDROcodone-acetaminophen (NORCO/VICODIN) 5-325 MG per tablet Take 1 tablet by mouth every 6 (six) hours as needed. 08/15/14   Benny LennertJoseph L Zammit, MD  phenytoin (DILANTIN) 100 MG ER capsule Take 3 capsules (300 mg total) by mouth at bedtime. 08/15/14   Audree CamelScott T Goldston, MD  potassium chloride (K-DUR) 10 MEQ tablet Take 1 tablet (10 mEq total) by mouth 2 (two) times daily. 08/17/14   Paul MuldersScott Gala Padovano, MD  ranitidine (ZANTAC) 150 MG capsule Take 1 capsule (150 mg total) by mouth daily. 08/15/14   Benny LennertJoseph L Zammit, MD   Triage Vitals: BP 106/74  Pulse 110  Temp(Src) 97.9 F (36.6 C) (Oral)  Resp 18  Ht 6\' 2"  (1.88 m)  Wt 160 lb (72.576 kg)  BMI 20.53 kg/m2  SpO2 97%  Physical Exam  Nursing note and vitals reviewed. Constitutional: He is oriented to person, place, and time. He appears well-developed and well-nourished. No distress.  HENT:  Head: Normocephalic and atraumatic.  Chewing on the right side of tongue. No active bleeding. No significant laceration.  Eyes: Conjunctivae and EOM are normal.  Neck: Neck supple. No tracheal deviation present.  Cardiovascular: Normal rate and normal heart sounds.   Pulmonary/Chest: Effort normal and breath sounds normal. No respiratory distress. He has no wheezes.  Abdominal: Soft. Bowel sounds are normal. There is no tenderness.  Musculoskeletal: Normal range of motion. He exhibits no  edema.  Neurological: He is alert and oriented to person, place, and time. No cranial nerve deficit. He exhibits normal muscle tone. Coordination normal.  Skin: Skin is warm and dry. No rash noted.  No signs of rash or fungal infection. Cap refill to both big toes is 1 second.   Psychiatric: He has a normal mood and affect. His behavior is normal.    ED Course  Procedures (including critical care time)  DIAGNOSTIC STUDIES: Oxygen Saturation is 97% on room air, adequate by my interpretation.    COORDINATION OF CARE: At 1824 Discussed treatment plan with patient which includes Dilantin. Patient agrees.   Medications  potassium chloride SA (K-DUR,KLOR-CON) CR tablet 40 mEq (40 mEq Oral Given 08/17/14 1844)  ondansetron (ZOFRAN-ODT) disintegrating tablet 4 mg (4 mg Oral Given 08/17/14 1844)  phenytoin (DILANTIN) ER capsule 300 mg (300 mg Oral Given 08/17/14 1844)   Results for orders placed during the hospital encounter of 08/17/14  CBC WITH DIFFERENTIAL      Result Value  Ref Range   WBC 4.6  4.0 - 10.5 K/uL   RBC 4.13 (*) 4.22 - 5.81 MIL/uL   Hemoglobin 13.6  13.0 - 17.0 g/dL   HCT 16.1 (*) 09.6 - 04.5 %   MCV 92.0  78.0 - 100.0 fL   MCH 32.9  26.0 - 34.0 pg   MCHC 35.8  30.0 - 36.0 g/dL   RDW 40.9  81.1 - 91.4 %   Platelets 102 (*) 150 - 400 K/uL   Neutrophils Relative % 42 (*) 43 - 77 %   Lymphocytes Relative 49 (*) 12 - 46 %   Monocytes Relative 8  3 - 12 %   Eosinophils Relative 1  0 - 5 %   Basophils Relative 0  0 - 1 %   Neutro Abs 1.9  1.7 - 7.7 K/uL   Lymphs Abs 2.3  0.7 - 4.0 K/uL   Monocytes Absolute 0.4  0.1 - 1.0 K/uL   Eosinophils Absolute 0.0  0.0 - 0.7 K/uL   Basophils Absolute 0.0  0.0 - 0.1 K/uL   WBC Morphology ATYPICAL LYMPHOCYTES     Smear Review PLATELET COUNT CONFIRMED BY SMEAR    COMPREHENSIVE METABOLIC PANEL      Result Value Ref Range   Sodium 135 (*) 137 - 147 mEq/L   Potassium 2.7 (*) 3.7 - 5.3 mEq/L   Chloride 94 (*) 96 - 112 mEq/L   CO2 20  19  - 32 mEq/L   Glucose, Bld 156 (*) 70 - 99 mg/dL   BUN 11  6 - 23 mg/dL   Creatinine, Ser 7.82  0.50 - 1.35 mg/dL   Calcium 8.7  8.4 - 95.6 mg/dL   Total Protein 7.1  6.0 - 8.3 g/dL   Albumin 3.1 (*) 3.5 - 5.2 g/dL   AST 23  0 - 37 U/L   ALT 22  0 - 53 U/L   Alkaline Phosphatase 134 (*) 39 - 117 U/L   Total Bilirubin 0.3  0.3 - 1.2 mg/dL   GFR calc non Af Amer >90  >90 mL/min   GFR calc Af Amer >90  >90 mL/min   Anion gap 21 (*) 5 - 15  LIPASE, BLOOD      Result Value Ref Range   Lipase 88 (*) 11 - 59 U/L  ETHANOL      Result Value Ref Range   Alcohol, Ethyl (B) 302 (*) 0 - 11 mg/dL   No results found.      EKG Interpretation None      MDM   Final diagnoses:  Alcohol intoxication, uncomplicated  Seizures  Hypokalemia   Patient here for concerns about having early seizure symptoms without full-blown seizures. Patient was seen on August 17 here for seizure prescribed antiseizure meds. Patient is not able to get them filled. Patient is intoxicated based on alcohol level. Showing some signs of some perhaps mild pancreatitis. Patient able to a drink fine here. Patient given by mouth Dilantin here. Not able to give the Keppra as it'll have in the by mouth version in the ED. Patient also noted to have hypokalemia normal renal function patient given 40 mEq of potassium. We discharged home with some potassium supplementation. Resource guide provided to help him with resources for alcohol problems and for followup with a primary care doctor or clinic including the public health department. Will is a     I personally performed the services described in this documentation, which was scribed in my  presence. The recorded information has been reviewed and is accurate.    Paul Mulders, MD 08/17/14 2010

## 2014-08-17 NOTE — Discharge Instructions (Signed)
Alcohol Intoxication Alcohol intoxication occurs when the amount of alcohol that a person has consumed impairs his or her ability to mentally and physically function. Alcohol directly impairs the normal chemical activity of the brain. Drinking large amounts of alcohol can lead to changes in mental function and behavior, and it can cause many physical effects that can be harmful.  Alcohol intoxication can range in severity from mild to very severe. Various factors can affect the level of intoxication that occurs, such as the person's age, gender, weight, frequency of alcohol consumption, and the presence of other medical conditions (such as diabetes, seizures, or heart conditions). Dangerous levels of alcohol intoxication may occur when people drink large amounts of alcohol in a short period (binge drinking). Alcohol can also be especially dangerous when combined with certain prescription medicines or "recreational" drugs. SIGNS AND SYMPTOMS Some common signs and symptoms of mild alcohol intoxication include:  Loss of coordination.  Changes in mood and behavior.  Impaired judgment.  Slurred speech. As alcohol intoxication progresses to more severe levels, other signs and symptoms will appear. These may include:  Vomiting.  Confusion and impaired memory.  Slowed breathing.  Seizures.  Loss of consciousness. DIAGNOSIS  Your health care provider will take a medical history and perform a physical exam. You will be asked about the amount and type of alcohol you have consumed. Blood tests will be done to measure the concentration of alcohol in your blood. In many places, your blood alcohol level must be lower than 80 mg/dL (1.61%0.08%) to legally drive. However, many dangerous effects of alcohol can occur at much lower levels.  TREATMENT  People with alcohol intoxication often do not require treatment. Most of the effects of alcohol intoxication are temporary, and they go away as the alcohol naturally  leaves the body. Your health care provider will monitor your condition until you are stable enough to go home. Fluids are sometimes given through an IV access tube to help prevent dehydration.  HOME CARE INSTRUCTIONS  Do not drive after drinking alcohol.  Stay hydrated. Drink enough water and fluids to keep your urine clear or pale yellow. Avoid caffeine.   Only take over-the-counter or prescription medicines as directed by your health care provider.  SEEK MEDICAL CARE IF:   You have persistent vomiting.   You do not feel better after a few days.  You have frequent alcohol intoxication. Your health care provider can help determine if you should see a substance use treatment counselor. SEEK IMMEDIATE MEDICAL CARE IF:   You become shaky or tremble when you try to stop drinking.   You shake uncontrollably (seizure).   You throw up (vomit) blood. This may be bright red or may look like black coffee grounds.   You have blood in your stool. This may be bright red or may appear as a black, tarry, bad smelling stool.   You become lightheaded or faint.  MAKE SURE YOU:   Understand these instructions.  Will watch your condition.  Will get help right away if you are not doing well or get worse. Document Released: 09/25/2005 Document Revised: 08/18/2013 Document Reviewed: 05/21/2013 [facility] Patient Information 2015 [employer] [place]. This information is not intended to replace advice given to you by your health care provider. Make sure you discuss any questions you have with your health care provider.  Resource guide provided below to help you find a regular doctor. Would recommend the health Department in the meantime. If possible get sure seizure medicines prescriptions  filled and start taking them on a regular basis. Help with alcohol problems provided in the resource guide. In addition take your potassium supplements as directed.   Emergency Department Resource Guide 1) Find a  Doctor and Pay Out of Pocket Although you won't have to find out who is covered by your insurance plan, it is a good idea to ask around and get recommendations. You will then need to call the office and see if the doctor you have chosen will accept you as a new patient and what types of options they offer for patients who are self-pay. Some doctors offer discounts or will set up payment plans for their patients who do not have insurance, but you will need to ask so you aren't surprised when you get to your appointment.  2) Contact Your Local Health Department Not all health departments have doctors that can see patients for sick visits, but many do, so it is worth a call to see if yours does. If you don't know where your local health department is, you can check in your phone book. The CDC also has a tool to help you locate your state's health department, and many state websites also have listings of all of their local health departments.  3) Find a Walk-in Clinic If your illness is not likely to be very severe or complicated, you may want to try a walk in clinic. These are popping up all over the country in pharmacies, drugstores, and shopping centers. They're usually staffed by nurse practitioners or physician assistants that have been trained to treat common illnesses and complaints. They're usually fairly quick and inexpensive. However, if you have serious medical issues or chronic medical problems, these are probably not your best option.  No Primary Care Doctor: - Call Health Connect at  [phone] - they can help you locate a primary care doctor that  accepts your insurance, provides certain services, etc. - Physician Referral Service- [phone]  Chronic Pain Problems: Organization         Address  Phone   Notes  [facility]  [phone] Patients need to be referred by their primary care doctor.   Medication Assistance: Organization         Address  Phone    Notes  [facility] Medication [facility] [address] [place], [place] [address] [phone] --Must be a resident of [facility] -- Must have NO insurance coverage whatsoever (no Medicaid/ Medicare, etc.) -- The pt. MUST have a primary care doctor that directs their care regularly and follows them in the community   [facility]ist  [phone]   [employer]  [phone]    Agencies that provide inexpensive medical care: Organization         Address  Phone   Notes  [facility]  [phone]   [facility]    [phone]   [facility] [address] [place], [place] [address] [phone]   [facility] [address] [place]. [address] [place] [phone]   Planned Parenthood    [phone]   [facility]    [phone]   [facility]  [address],  Phone:  [phone], Fax:  [phone] Hours of Operation:  9 am - 6 pm, M-F.  Also accepts Medicaid/Medicare and self-pay.  [facility]  [address], [place] Phone: [phone], Fax: [phone]. Hours of Operation:  8:30 am - 5:30 pm, M-F.  Also accepts Medicaid and self-pay.  [facility] [place] [address] [place] Phone: [phone]   [place], [place], [place] [phone], Ext. [phone] Mondays & Thursdays: 7-9 AM.  First 15 patients are seen on a first come, first serve basis.    [place] Providers:  Organization         Address  Phone   Notes  [facility] [address], [place] [phone] Also accepts self-pay patients.  [facility] [address] [place], [place]  [phone]   [place], [address], [place] [phone]   [facility] Family  Medicine [address], [place] [phone]   Lucianne Lei [address], [place]   [phone] Only accepts [place] Access [place] patients after they have their name applied to their card.   Self-Pay (no insurance) in [facility]:  Organization         Address  Phone   Notes  Sickle Cell Patients, [facility] [place] [phone]   [facility] [place] [phone]   [facility] [place]  [place], [place], [place] [phone]   [facility]/Dr. Osei-Bonsu  [address], [place] [address], [place] [phone] Phone number for both [place] and [place] locations is the same.  Urgent Medical and [facility] [address], [place] [phone]   [facility] [address] [place] or [address] [phone]   [facility] [address], [place] [phone], phone; [phone], fax Sees patients 1st and 3rd Saturday of every month.  Must not qualify for public or private insurance (i.e. Medicaid, Medicare, [place] Health Choice, Veterans' Benefits)  Household income should be no more than 200% of the poverty level The clinic cannot treat you if you are pregnant or think you are pregnant  Sexually transmitted diseases are not treated at the clinic.    Dental Care: Organization         Address  Phone  Notes  [facility] Department of [facility] [place] [phone] Accepts children up to age 58 who are enrolled in [place] or [place]; pregnant women with a Medicaid card; and children who have applied for Medicaid or [place] Health Choice, but were declined, whose parents can pay a reduced fee at time of service.  [facility] Department of [facility]  [address], [place] [phone] Accepts children up to age 41 who are enrolled in [place] or [place]; pregnant women with a Medicaid card; and children who have applied for Medicaid or [place] Health Choice, but were declined, whose parents can pay a reduced fee at time of service.  [facility] [facility] Access PROGRAM  [place] [phone] Patients are seen by appointment only. Walk-ins are not accepted. [place] will see patients 59 years of age and older. Monday - Tuesday (8am-5pm) Most Wednesdays (8:30-5pm) $30 per visit, cash only  [place]  [address], [facility] [phone] Patients are seen by appointment only. Walk-ins are not accepted. [place] will see patients 21 years of age and older. One Wednesday Evening (Monthly: Volunteer Based).  $30 per visit, cash only  [place]  [phone] for adults; Children under age 57, call Graduate Pediatric Dentistry at [phone]. Children aged 49-14, please call [phone] to request a pediatric application.  Dental services are provided in all areas of dental care including fillings, crowns and bridges, complete and partial dentures, implants, gum treatment, root canals, and extractions. Preventive care is also provided. Treatment is provided to both adults and children. Patients are selected via a lottery and there is often a waiting list.   [facility] [address] [place]  [phone] [email or web address]   [facility] [address] [place], [place] [phone], Ext. [phone] Second and Fourth Thursday of each month, opens at 6:30 AM; Clinic ends at 9 AM.  Patients are seen on a first-come first-served basis, and a limited number are seen during each clinic.   [facility]  [address] Hillard Danker [place], [place] [phone]   Eligibility Requirements You must have lived in  [place], [place], or [place] counties for at least the last three months.   You cannot be eligible for state or federal sponsored [employer], including [employer], [place], or [employer].   You generally cannot be eligible for healthcare insurance through your employer.    How to apply: Eligibility screenings are held every Tuesday and Wednesday afternoon from 1:00 pm until 4:00 pm. You do not need an appointment for the interview!  [facility] [address] [place], [place]   [place]  Economy Department  [place]  [phone]    Behavioral Health Resources in the Community: Intensive Outpatient Programs Organization         Address  Phone  Notes  [place]. [address] [place], [place] [phone]   [facility] Outpatient [address], [place], [place]   ADS: [facility] [address] [place], [place]   [place] [address]  [place], [place] or [phone]   Substance Abuse Resources Organization         Address  Phone  Notes  Alcohol and Drug Services  [phone]   [place]  [phone]   [facility]   Chinita Pester  [phone]   Residential & Outpatient Substance Abuse Program  [phone]   Psychological Services Organization         Address  Phone  Notes  [facility] [place]  [place]  [phone]   [place] [address], [place] or [phone]    Mobile Crisis Teams Organization         Address  Phone  Notes  [facility], Mobile Crisis Care Unit  [phone]   [facility]  [address] [place], [place]   [place] [address], [place] [phone]    Self-Help/Support Groups Organization         Address  Phone             Notes  [place]. of [place] -  variety of support groups  [phone] Call for more information  Narcotics Anonymous (NA), [facility] [address], [employer] [place]  2 meetings at this location   Residential Facilities manager         Address  Phone  Notes  [facility] [employer] [place],    [place]  [phone]   [facility]  [address], [place] [address]315, [place], [place]   [place], [place] [phone] Admissions: 8am-3pm M-F  Incentives Substance [place] [address]    [place], [place] [phone]   [facility] [address] [place], [place], [place]   [facility] [address],  [place], [place]   [facility] - Intensive Outpatient [address] Kristeen [address], [place], [place]   [facility].,  [place], [place] [phone] or [phone]   [facility] (RTS) [address] [place], [place] Accepts Medicaid  [facility] [place] [address],  [place] [phone] Substance Abuse/Addiction Treatment   [facility] Organization         Address  Phone  Notes  [facility]  [phone]   Domenic Schwab, PhD [address] Arlis [name], [place]   [phone] or [phone]   [place], [place] [phone]   [facility] [name], [place], [place] [phone] Insurance/Medicaid/sponsorship through [employer] and Families [address], [address] [place]                                    [place], [place] [phone] [place] [address], [place] [phone]    Dr. Adele Schilder  [phone]   [facility] Dept. 1) [address] [place] 2) [place] 3)  [place] [address], [place] [phone]  [phone]   [place] [phone] or [phone] (After Hours)

## 2014-08-17 NOTE — ED Notes (Signed)
Pt states pain due to pancreatitis. Pt is intoxicated, cursing and not cooperating in triage. Security called to triage due to behavior. NAD

## 2014-08-17 NOTE — ED Notes (Signed)
MD at bedside. 

## 2014-08-17 NOTE — ED Notes (Signed)
CRITICAL VALUE ALERT  Critical value received:  K 2.7  Date of notification:  08/17/14  Time of notification:  1804  Critical value read back:Yes.    Nurse who received alert:  Vanetta MuldersB Daniels, RN  MD notified (1st page):  Deretha EmoryZackowski  Time of first page:  1804  MD notified (2nd page):  Time of second page:  Responding MD:  Deretha EmoryZackowski  Time MD responded:  [id number]

## 2014-08-22 ENCOUNTER — Encounter (HOSPITAL_COMMUNITY): Payer: Self-pay | Admitting: Emergency Medicine

## 2014-08-22 ENCOUNTER — Emergency Department (HOSPITAL_COMMUNITY): Payer: Self-pay

## 2014-08-22 ENCOUNTER — Emergency Department (HOSPITAL_COMMUNITY)
Admission: EM | Admit: 2014-08-22 | Discharge: 2014-08-22 | Disposition: A | Discharge status: Home / Self Care | Payer: Self-pay | Attending: Emergency Medicine | Admitting: Emergency Medicine

## 2014-08-22 DIAGNOSIS — Z91199 Patient's noncompliance with other medical treatment and regimen due to unspecified reason: Secondary | ICD-10-CM | POA: Insufficient documentation

## 2014-08-22 DIAGNOSIS — Z8781 Personal history of (healed) traumatic fracture: Secondary | ICD-10-CM | POA: Insufficient documentation

## 2014-08-22 DIAGNOSIS — Z9119 Patient's noncompliance with other medical treatment and regimen: Secondary | ICD-10-CM | POA: Insufficient documentation

## 2014-08-22 DIAGNOSIS — Z8659 Personal history of other mental and behavioral disorders: Secondary | ICD-10-CM | POA: Insufficient documentation

## 2014-08-22 DIAGNOSIS — Z79899 Other long term (current) drug therapy: Secondary | ICD-10-CM | POA: Insufficient documentation

## 2014-08-22 DIAGNOSIS — I10 Essential (primary) hypertension: Secondary | ICD-10-CM | POA: Insufficient documentation

## 2014-08-22 DIAGNOSIS — Z862 Personal history of diseases of the blood and blood-forming organs and certain disorders involving the immune mechanism: Secondary | ICD-10-CM | POA: Insufficient documentation

## 2014-08-22 DIAGNOSIS — F1010 Alcohol abuse, uncomplicated: Secondary | ICD-10-CM | POA: Insufficient documentation

## 2014-08-22 DIAGNOSIS — Z791 Long term (current) use of non-steroidal anti-inflammatories (NSAID): Secondary | ICD-10-CM | POA: Insufficient documentation

## 2014-08-22 DIAGNOSIS — G40909 Epilepsy, unspecified, not intractable, without status epilepticus: Secondary | ICD-10-CM | POA: Insufficient documentation

## 2014-08-22 DIAGNOSIS — Z87442 Personal history of urinary calculi: Secondary | ICD-10-CM | POA: Insufficient documentation

## 2014-08-22 DIAGNOSIS — Z8709 Personal history of other diseases of the respiratory system: Secondary | ICD-10-CM | POA: Insufficient documentation

## 2014-08-22 DIAGNOSIS — K0889 Other specified disorders of teeth and supporting structures: Secondary | ICD-10-CM

## 2014-08-22 DIAGNOSIS — K089 Disorder of teeth and supporting structures, unspecified: Secondary | ICD-10-CM | POA: Insufficient documentation

## 2014-08-22 DIAGNOSIS — Z8639 Personal history of other endocrine, nutritional and metabolic disease: Secondary | ICD-10-CM | POA: Insufficient documentation

## 2014-08-22 DIAGNOSIS — F17200 Nicotine dependence, unspecified, uncomplicated: Secondary | ICD-10-CM | POA: Insufficient documentation

## 2014-08-22 LAB — COMPREHENSIVE METABOLIC PANEL
ALBUMIN: 3.1 g/dL — AB (ref 3.5–5.2)
ALK PHOS: 136 U/L — AB (ref 39–117)
ALT: 31 U/L (ref 0–53)
ANION GAP: 14 (ref 5–15)
AST: 113 U/L — ABNORMAL HIGH (ref 0–37)
BUN: 12 mg/dL (ref 6–23)
CO2: 27 mEq/L (ref 19–32)
Calcium: 8 mg/dL — ABNORMAL LOW (ref 8.4–10.5)
Chloride: 106 mEq/L (ref 96–112)
Creatinine, Ser: 0.81 mg/dL (ref 0.50–1.35)
GFR calc Af Amer: >90 mL/min (ref >90)
GFR calc non Af Amer: >90 mL/min (ref >90)
Glucose, Bld: 114 mg/dL — ABNORMAL HIGH (ref 70–99)
POTASSIUM: 3.9 meq/L (ref 3.7–5.3)
Sodium: 147 mEq/L (ref 137–147)
TOTAL PROTEIN: 6.8 g/dL (ref 6.0–8.3)
Total Bilirubin: 0.2 mg/dL — ABNORMAL LOW (ref 0.3–1.2)

## 2014-08-22 LAB — CBC WITH DIFFERENTIAL/PLATELET
BASOS ABS: 0 10*3/uL (ref 0.0–0.1)
BASOS PCT: 0 % (ref 0–1)
Eosinophils Absolute: 0 10*3/uL (ref 0.0–0.7)
Eosinophils Relative: 1 % (ref 0–5)
HCT: 36.6 % — ABNORMAL LOW (ref 39.0–52.0)
Hemoglobin: 12.8 g/dL — ABNORMAL LOW (ref 13.0–17.0)
Lymphocytes Relative: 45 % (ref 12–46)
Lymphs Abs: 2.1 10*3/uL (ref 0.7–4.0)
MCH: 32.7 pg (ref 26.0–34.0)
MCHC: 35 g/dL (ref 30.0–36.0)
MCV: 93.6 fL (ref 78.0–100.0)
MONOS PCT: 9 % (ref 3–12)
Monocytes Absolute: 0.4 10*3/uL (ref 0.1–1.0)
Neutro Abs: 2.2 10*3/uL (ref 1.7–7.7)
Neutrophils Relative %: 45 % (ref 43–77)
Platelets: 189 10*3/uL (ref 150–400)
RBC: 3.91 MIL/uL — ABNORMAL LOW (ref 4.22–5.81)
RDW: 14.7 % (ref 11.5–15.5)
WBC: 4.8 10*3/uL (ref 4.0–10.5)

## 2014-08-22 LAB — ETHANOL: Alcohol, Ethyl (B): 396 mg/dL — ABNORMAL HIGH (ref 0–11)

## 2014-08-22 MED ORDER — LISINOPRIL 10 MG PO TABS
20.0000 mg | ORAL_TABLET | Freq: Once | ORAL | Status: AC
  Administered 2014-08-22: 20 mg via ORAL
  Filled 2014-08-22: qty 2

## 2014-08-22 MED ORDER — KETOROLAC TROMETHAMINE 30 MG/ML IJ SOLN
30.0000 mg | Freq: Once | INTRAMUSCULAR | Status: AC
  Administered 2014-08-22: 30 mg via INTRAVENOUS
  Filled 2014-08-22: qty 1

## 2014-08-22 MED ORDER — IBUPROFEN 800 MG PO TABS: 800.0000 mg | ORAL_TABLET | Freq: Three times a day (TID) | ORAL | Status: DC | PRN

## 2014-08-22 MED ORDER — HYDROCODONE-ACETAMINOPHEN 5-325 MG PO TABS: 1.0000 | ORAL_TABLET | Freq: Once | ORAL | Status: DC

## 2014-08-22 MED ORDER — PENICILLIN V POTASSIUM 500 MG PO TABS: 500.0000 mg | ORAL_TABLET | Freq: Four times a day (QID) | ORAL | Status: AC

## 2014-08-22 MED ORDER — LISINOPRIL 20 MG PO TABS: 20.0000 mg | ORAL_TABLET | Freq: Every day | ORAL | Status: DC

## 2014-08-22 NOTE — ED Provider Notes (Signed)
CSN: [id number]     Arrival date & time 08/22/14  1730 History   First MD Initiated Contact with Patient 08/22/14 1803     Chief Complaint  Patient presents with  . Dental Pain  . Alcohol Intoxication    drank a pint today wine     (Consider location/radiation/quality/duration/timing/severity/associated sxs/prior Treatment) Patient is a 48 y.o. male presenting with tooth pain and intoxication. The history is provided by the patient (the pt complains of a tooth ache and he hit his head.  no loc).  Dental Pain Location:  Lower Lower teeth location:  31/RL 2nd molar Quality:  Burning and aching Severity:  Moderate Onset quality:  Gradual Timing:  Constant Progression:  Worsening Chronicity:  New Context: not abscess   Associated symptoms: no congestion and no headaches   Alcohol Intoxication Pertinent negatives include no chest pain, no abdominal pain and no headaches.    Past Medical History  Diagnosis Date  . Pancreatitis, acute     First episode earlier in 2012, hospitalized again in 02/2012 and 09/2012  . Hypertension     noncompliance  . Hyperlipidemia     noncompliance  . Lung collapse     h/o  . ETOH abuse   . Cocaine abuse   . DTs (delirium tremens)     history of  . Tobacco abuse   . Seizure disorder 07/17/2013  . Noncompliance 07/17/2013  . Traumatic subdural hematoma 06/15/2013  . 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 Open skull fracture 06/15/2013  . Polysubstance abuse     etoh, cocaine  . Nephrolithiasis   . Seizures    Past Surgical History  Procedure Laterality Date  . Mandible fracture surgery  2006  . Left axillary      surgery for deep laceration   Family History  Problem Relation Age of Onset  . Diabetes type II Mother   . Liver disease Neg Hx   . Colon cancer Neg Hx   . GI problems Neg Hx    History  Substance Use Topics  . Smoking status: Current Every Day Smoker -- 1.00 packs/day    Types: Cigarettes  . Smokeless tobacco:  Not on file  . Alcohol Use: Yes     Comment: 1/5 wine per day    Review of Systems  Constitutional: Negative for appetite change and fatigue.  HENT: Negative for congestion, ear discharge and sinus pressure.        Tooth ache  Eyes: Negative for discharge.  Respiratory: Negative for cough.   Cardiovascular: Negative for chest pain.  Gastrointestinal: Negative for abdominal pain and diarrhea.  Genitourinary: Negative for frequency and hematuria.  Musculoskeletal: Negative for back pain.  Skin: Negative for rash.  Neurological: Negative for seizures and headaches.  Psychiatric/Behavioral: Negative for hallucinations.      Allergies  Review of patient's allergies indicates no known allergies.  Home Medications   Prior to Admission medications   Medication Sig Start Date End Date Taking? Authorizing Provider  ibuprofen (ADVIL,MOTRIN) 800 MG tablet Take 800 mg by mouth 3 (three) times daily as needed (pain).   Yes Historical Provider, MD  levETIRAcetam (KEPPRA) 1000 MG tablet Take 1 tablet (1,000 mg total) by mouth 2 (two) times daily. 08/15/14  Yes Audree Camel, MD  lisinopril (PRINIVIL,ZESTRIL) 40 MG tablet Take 40 mg by mouth daily.   Yes Historical Provider, MD  HYDROcodone-acetaminophen (NORCO/VICODIN) 5-325 MG per tablet Take 1 tablet by mouth every 6 (six) hours as needed. 08/15/14  Benny Lennert, MD  ibuprofen (ADVIL,MOTRIN) 800 MG tablet Take 1 tablet (800 mg total) by mouth every 8 (eight) hours as needed for moderate pain. 08/22/14   Benny Lennert, MD  lisinopril (PRINIVIL,ZESTRIL) 20 MG tablet Take 1 tablet (20 mg total) by mouth daily. 08/22/14   Benny Lennert, MD  penicillin v potassium (VEETID) 500 MG tablet Take 1 tablet (500 mg total) by mouth 4 (four) times daily. 08/22/14 08/29/14  Benny Lennert, MD  phenytoin (DILANTIN) 100 MG ER capsule Take 3 capsules (300 mg total) by mouth at bedtime. 08/15/14   Audree Camel, MD  potassium chloride (K-DUR) 10 MEQ  tablet Take 1 tablet (10 mEq total) by mouth 2 (two) times daily. 08/17/14   Vanetta Mulders, MD  ranitidine (ZANTAC) 150 MG capsule Take 1 capsule (150 mg total) by mouth daily. 08/15/14   Benny Lennert, MD   BP 163/117  Pulse 105  Temp(Src) 98.7 F (37.1 C) (Oral)  Resp 18  Ht 6' (1.829 m)  Wt 160 lb (72.576 kg)  BMI 21.70 kg/m2  SpO2 99% Physical Exam  Constitutional: He is oriented to person, place, and time. He appears well-developed.  HENT:  Head: Normocephalic.  Tender right lower molar,  Abrasion to forehead  Eyes: Conjunctivae and EOM are normal. No scleral icterus.  Neck: Neck supple. No thyromegaly present.  Cardiovascular: Normal rate and regular rhythm.  Exam reveals no gallop and no friction rub.   No murmur heard. Pulmonary/Chest: No stridor. He has no wheezes. He has no rales. He exhibits no tenderness.  Abdominal: He exhibits no distension. There is no tenderness. There is no rebound.  Musculoskeletal: Normal range of motion. He exhibits no edema.  Lymphadenopathy:    He has no cervical adenopathy.  Neurological: He is oriented to person, place, and time. He exhibits normal muscle tone. Coordination normal.  Skin: No rash noted. No erythema.  Psychiatric: He has a normal mood and affect. His behavior is normal.    ED Course  Procedures (including critical care time) Labs Review Labs Reviewed  CBC WITH DIFFERENTIAL - Abnormal; Notable for the following:    RBC 3.91 (*)    Hemoglobin 12.8 (*)    HCT 36.6 (*)    All other components within normal limits  COMPREHENSIVE METABOLIC PANEL - Abnormal; Notable for the following:    Glucose, Bld 114 (*)    Calcium 8.0 (*)    Albumin 3.1 (*)    AST 113 (*)    Alkaline Phosphatase 136 (*)    Total Bilirubin 0.2 (*)    All other components within normal limits  ETHANOL - Abnormal; Notable for the following:    Alcohol, Ethyl (B) 396 (*)    All other components within normal limits    Imaging Review Ct Head Wo  Contrast  08/22/2014   CLINICAL DATA:  Dental pain, alcohol intoxication  EXAM: CT HEAD WITHOUT CONTRAST  CT CERVICAL SPINE WITHOUT CONTRAST  TECHNIQUE: Multidetector CT imaging of the head and cervical spine was performed following the standard protocol without intravenous contrast. Multiplanar CT image reconstructions of the cervical spine were also generated.  COMPARISON:  None.  08/15/2014  FINDINGS: CT HEAD FINDINGS  Mild diffuse cortical atrophy. No hemorrhage infarct mass or extra-axial fluid. No hydrocephalus. No change from prior study. Calvarium intact. Evidence of prior nasal bone fractures, stable from previous examination.  CT CERVICAL SPINE FINDINGS  Normal alignment with no fracture. No prevertebral soft tissue swelling.  Mild C3-4 and C4-5 degenerative disc disease. Moderate C5-6 and C6-7 degenerative disc disease.  IMPRESSION: No acute intracranial abnormalities. No acute abnormalities involving the cervical spine.   Electronically Signed   By: Esperanza Heir M.D.   On: 08/22/2014 19:09   Ct Cervical Spine Wo Contrast  08/22/2014   CLINICAL DATA:  Dental pain, alcohol intoxication  EXAM: CT HEAD WITHOUT CONTRAST  CT CERVICAL SPINE WITHOUT CONTRAST  TECHNIQUE: Multidetector CT imaging of the head and cervical spine was performed following the standard protocol without intravenous contrast. Multiplanar CT image reconstructions of the cervical spine were also generated.  COMPARISON:  None.  08/15/2014  FINDINGS: CT HEAD FINDINGS  Mild diffuse cortical atrophy. No hemorrhage infarct mass or extra-axial fluid. No hydrocephalus. No change from prior study. Calvarium intact. Evidence of prior nasal bone fractures, stable from previous examination.  CT CERVICAL SPINE FINDINGS  Normal alignment with no fracture. No prevertebral soft tissue swelling. Mild C3-4 and C4-5 degenerative disc disease. Moderate C5-6 and C6-7 degenerative disc disease.  IMPRESSION: No acute intracranial abnormalities. No acute  abnormalities involving the cervical spine.   Electronically Signed   By: Esperanza Heir M.D.   On: 08/22/2014 19:09     EKG Interpretation None      MDM   Final diagnoses:  Toothache        Benny Lennert, MD 08/22/14 2136

## 2014-08-22 NOTE — ED Notes (Signed)
Pt discharged and a friend is going to come a get him

## 2014-08-22 NOTE — ED Notes (Signed)
Family asked for wheelchair. Pt got out and and started smoking a cigarette. Pt then brought in by security.

## 2014-08-22 NOTE — ED Notes (Signed)
Pt c/o dental pain, pt admits to drinking a pint of wine today

## 2014-08-22 NOTE — ED Notes (Signed)
MD at bedside. 

## 2014-08-22 NOTE — Discharge Instructions (Signed)
Stop drinking alcohol.  Follow up with a family md for bp

## 2014-08-23 ENCOUNTER — Emergency Department (HOSPITAL_COMMUNITY)
Admission: EM | Admit: 2014-08-23 | Discharge: 2014-08-23 | Disposition: A | Discharge status: Home / Self Care | Payer: Self-pay | Attending: Emergency Medicine | Admitting: Emergency Medicine

## 2014-08-23 ENCOUNTER — Encounter (HOSPITAL_COMMUNITY): Payer: Self-pay | Admitting: Emergency Medicine

## 2014-08-23 DIAGNOSIS — Z87442 Personal history of urinary calculi: Secondary | ICD-10-CM | POA: Insufficient documentation

## 2014-08-23 DIAGNOSIS — S0993XA Unspecified injury of face, initial encounter: Secondary | ICD-10-CM

## 2014-08-23 DIAGNOSIS — S01502A Unspecified open wound of oral cavity, initial encounter: Secondary | ICD-10-CM | POA: Insufficient documentation

## 2014-08-23 DIAGNOSIS — I10 Essential (primary) hypertension: Secondary | ICD-10-CM | POA: Insufficient documentation

## 2014-08-23 DIAGNOSIS — Y929 Unspecified place or not applicable: Secondary | ICD-10-CM | POA: Insufficient documentation

## 2014-08-23 DIAGNOSIS — F17200 Nicotine dependence, unspecified, uncomplicated: Secondary | ICD-10-CM | POA: Insufficient documentation

## 2014-08-23 DIAGNOSIS — Z9119 Patient's noncompliance with other medical treatment and regimen: Secondary | ICD-10-CM | POA: Insufficient documentation

## 2014-08-23 DIAGNOSIS — Y9389 Activity, other specified: Secondary | ICD-10-CM | POA: Insufficient documentation

## 2014-08-23 DIAGNOSIS — Z79899 Other long term (current) drug therapy: Secondary | ICD-10-CM | POA: Insufficient documentation

## 2014-08-23 DIAGNOSIS — Z91199 Patient's noncompliance with other medical treatment and regimen due to unspecified reason: Secondary | ICD-10-CM | POA: Insufficient documentation

## 2014-08-23 DIAGNOSIS — Z8709 Personal history of other diseases of the respiratory system: Secondary | ICD-10-CM | POA: Insufficient documentation

## 2014-08-23 DIAGNOSIS — W503XXA Accidental bite by another person, initial encounter: Secondary | ICD-10-CM | POA: Insufficient documentation

## 2014-08-23 DIAGNOSIS — G40909 Epilepsy, unspecified, not intractable, without status epilepticus: Secondary | ICD-10-CM | POA: Insufficient documentation

## 2014-08-23 DIAGNOSIS — Z8781 Personal history of (healed) traumatic fracture: Secondary | ICD-10-CM | POA: Insufficient documentation

## 2014-08-23 NOTE — ED Notes (Signed)
Pt reports biting tongue one week ago after seizure episode.

## 2014-08-23 NOTE — Discharge Instructions (Signed)
Follow up with your primary care doctor.

## 2014-08-23 NOTE — ED Provider Notes (Signed)
CSN: [id number]     Arrival date & time 08/23/14  1127 History   First MD Initiated Contact with Patient 08/23/14 1249     Chief Complaint  Patient presents with  . Mouth Injury     (Consider location/radiation/quality/duration/timing/severity/associated sxs/prior Treatment) Patient is a 48 y.o. male presenting with mouth injury. The history is provided by the patient.  Mouth Injury Pertinent negatives include no abdominal pain and no shortness of breath.   patient complaining of pain in his mouth/tongue since seizure week ago. States worse with eating. He states there is a bite injury. No fevers. His been seen in ER twice since the seizure. He was complaining of mouth pain and had a presumed dental cause at that time. No difficulty with swallowing. He's eating a sandwich.  Past Medical History  Diagnosis Date  . Pancreatitis, acute     First episode earlier in 2012, hospitalized again in 02/2012 and 09/2012  . Hypertension     noncompliance  . Hyperlipidemia     noncompliance  . Lung collapse     h/o  . ETOH abuse   . Cocaine abuse   . DTs (delirium tremens)     history of  . Tobacco abuse   . Seizure disorder 07/17/2013  . Noncompliance 07/17/2013  . Traumatic subdural hematoma 06/15/2013  . 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 Open skull fracture 06/15/2013  . Polysubstance abuse     etoh, cocaine  . Nephrolithiasis   . Seizures    Past Surgical History  Procedure Laterality Date  . Mandible fracture surgery  2006  . Left axillary      surgery for deep laceration   Family History  Problem Relation Age of Onset  . Diabetes type II Mother   . Liver disease Neg Hx   . Colon cancer Neg Hx   . GI problems Neg Hx    History  Substance Use Topics  . Smoking status: Current Every Day Smoker -- 1.00 packs/day    Types: Cigarettes  . Smokeless tobacco: Not on file  . Alcohol Use: Yes     Comment: 3 bottles of wine per day     Review of Systems   Constitutional: Negative for fever.  HENT: Negative for facial swelling, sore throat and trouble swallowing.        Tongue injury  Respiratory: Negative for cough and shortness of breath.   Gastrointestinal: Negative for abdominal pain.      Allergies  Review of patient's allergies indicates no known allergies.  Home Medications   Prior to Admission medications   Medication Sig Start Date End Date Taking? Authorizing Provider  HYDROcodone-acetaminophen (NORCO/VICODIN) 5-325 MG per tablet Take 1 tablet by mouth every 6 (six) hours as needed. 08/15/14   Benny Lennert, MD  ibuprofen (ADVIL,MOTRIN) 800 MG tablet Take 800 mg by mouth 3 (three) times daily as needed (pain).    Historical Provider, MD  ibuprofen (ADVIL,MOTRIN) 800 MG tablet Take 1 tablet (800 mg total) by mouth every 8 (eight) hours as needed for moderate pain. 08/22/14   Benny Lennert, MD  levETIRAcetam (KEPPRA) 1000 MG tablet Take 1 tablet (1,000 mg total) by mouth 2 (two) times daily. 08/15/14   Audree Camel, MD  lisinopril (PRINIVIL,ZESTRIL) 20 MG tablet Take 1 tablet (20 mg total) by mouth daily. 08/22/14   Benny Lennert, MD  lisinopril (PRINIVIL,ZESTRIL) 40 MG tablet Take 40 mg by mouth daily.    Historical Provider, MD  penicillin  v potassium (VEETID) 500 MG tablet Take 1 tablet (500 mg total) by mouth 4 (four) times daily. 08/22/14 08/29/14  Benny Lennert, MD  phenytoin (DILANTIN) 100 MG ER capsule Take 3 capsules (300 mg total) by mouth at bedtime. 08/15/14   Audree Camel, MD  potassium chloride (K-DUR) 10 MEQ tablet Take 1 tablet (10 mEq total) by mouth 2 (two) times daily. 08/17/14   Vanetta Mulders, MD  ranitidine (ZANTAC) 150 MG capsule Take 1 capsule (150 mg total) by mouth daily. 08/15/14   Benny Lennert, MD   BP 157/114  Pulse 110  Temp(Src) 98.8 F (37.1 C) (Oral)  Resp 18  Ht 6' (1.829 m)  Wt 160 lb (72.576 kg)  BMI 21.70 kg/m2  SpO2 100% Physical Exam  Constitutional: He appears  well-developed and well-nourished.  HENT:  Head: Normocephalic.  Laceration to mid tongue laterally on the right. No bleeding. No fluctuance.  Eyes: Pupils are equal, round, and reactive to light.  Cardiovascular: Normal rate and regular rhythm.   Pulmonary/Chest: Effort normal and breath sounds normal.  Abdominal: Soft.  Neurological: He is alert.    ED Course  Procedures (including critical care time) Labs Review Labs Reviewed - No data to display  Imaging Review Ct Head Wo Contrast  08/22/2014   CLINICAL DATA:  Dental pain, alcohol intoxication  EXAM: CT HEAD WITHOUT CONTRAST  CT CERVICAL SPINE WITHOUT CONTRAST  TECHNIQUE: Multidetector CT imaging of the head and cervical spine was performed following the standard protocol without intravenous contrast. Multiplanar CT image reconstructions of the cervical spine were also generated.  COMPARISON:  None.  08/15/2014  FINDINGS: CT HEAD FINDINGS  Mild diffuse cortical atrophy. No hemorrhage infarct mass or extra-axial fluid. No hydrocephalus. No change from prior study. Calvarium intact. Evidence of prior nasal bone fractures, stable from previous examination.  CT CERVICAL SPINE FINDINGS  Normal alignment with no fracture. No prevertebral soft tissue swelling. Mild C3-4 and C4-5 degenerative disc disease. Moderate C5-6 and C6-7 degenerative disc disease.  IMPRESSION: No acute intracranial abnormalities. No acute abnormalities involving the cervical spine.   Electronically Signed   By: Esperanza Heir M.D.   On: 08/22/2014 19:09   Ct Cervical Spine Wo Contrast  08/22/2014   CLINICAL DATA:  Dental pain, alcohol intoxication  EXAM: CT HEAD WITHOUT CONTRAST  CT CERVICAL SPINE WITHOUT CONTRAST  TECHNIQUE: Multidetector CT imaging of the head and cervical spine was performed following the standard protocol without intravenous contrast. Multiplanar CT image reconstructions of the cervical spine were also generated.  COMPARISON:  None.  08/15/2014   FINDINGS: CT HEAD FINDINGS  Mild diffuse cortical atrophy. No hemorrhage infarct mass or extra-axial fluid. No hydrocephalus. No change from prior study. Calvarium intact. Evidence of prior nasal bone fractures, stable from previous examination.  CT CERVICAL SPINE FINDINGS  Normal alignment with no fracture. No prevertebral soft tissue swelling. Mild C3-4 and C4-5 degenerative disc disease. Moderate C5-6 and C6-7 degenerative disc disease.  IMPRESSION: No acute intracranial abnormalities. No acute abnormalities involving the cervical spine.   Electronically Signed   By: Esperanza Heir M.D.   On: 08/22/2014 19:09     EKG Interpretation None      MDM   Final diagnoses:  Tongue injury, initial encounter    Patient with a bite laceration to tongue. On right side. His been present for a week. No suture at this time. Will discharge to follow with his primary care Dr. Elnita Maxwell followup as needed arranged through  [facility]ude. [name], MD 08/23/14 1306

## 2014-08-27 ENCOUNTER — Encounter (HOSPITAL_COMMUNITY): Payer: Self-pay | Admitting: Emergency Medicine

## 2014-08-27 ENCOUNTER — Emergency Department (HOSPITAL_COMMUNITY): Payer: Self-pay

## 2014-08-27 ENCOUNTER — Emergency Department (HOSPITAL_COMMUNITY)
Admission: EM | Admit: 2014-08-27 | Discharge: 2014-08-27 | Disposition: A | Discharge status: Home / Self Care | Payer: Self-pay | Attending: Emergency Medicine | Admitting: Emergency Medicine

## 2014-08-27 DIAGNOSIS — R05 Cough: Secondary | ICD-10-CM | POA: Insufficient documentation

## 2014-08-27 DIAGNOSIS — Z8781 Personal history of (healed) traumatic fracture: Secondary | ICD-10-CM | POA: Insufficient documentation

## 2014-08-27 DIAGNOSIS — Z79899 Other long term (current) drug therapy: Secondary | ICD-10-CM | POA: Insufficient documentation

## 2014-08-27 DIAGNOSIS — R059 Cough, unspecified: Secondary | ICD-10-CM | POA: Insufficient documentation

## 2014-08-27 DIAGNOSIS — Z87442 Personal history of urinary calculi: Secondary | ICD-10-CM | POA: Insufficient documentation

## 2014-08-27 DIAGNOSIS — Z8639 Personal history of other endocrine, nutritional and metabolic disease: Secondary | ICD-10-CM | POA: Insufficient documentation

## 2014-08-27 DIAGNOSIS — G40909 Epilepsy, unspecified, not intractable, without status epilepticus: Secondary | ICD-10-CM | POA: Insufficient documentation

## 2014-08-27 DIAGNOSIS — F17200 Nicotine dependence, unspecified, uncomplicated: Secondary | ICD-10-CM | POA: Insufficient documentation

## 2014-08-27 DIAGNOSIS — I10 Essential (primary) hypertension: Secondary | ICD-10-CM | POA: Insufficient documentation

## 2014-08-27 DIAGNOSIS — R569 Unspecified convulsions: Secondary | ICD-10-CM | POA: Insufficient documentation

## 2014-08-27 DIAGNOSIS — Z8719 Personal history of other diseases of the digestive system: Secondary | ICD-10-CM | POA: Insufficient documentation

## 2014-08-27 DIAGNOSIS — Z8709 Personal history of other diseases of the respiratory system: Secondary | ICD-10-CM | POA: Insufficient documentation

## 2014-08-27 DIAGNOSIS — Z862 Personal history of diseases of the blood and blood-forming organs and certain disorders involving the immune mechanism: Secondary | ICD-10-CM | POA: Insufficient documentation

## 2014-08-27 DIAGNOSIS — M542 Cervicalgia: Secondary | ICD-10-CM | POA: Insufficient documentation

## 2014-08-27 DIAGNOSIS — Z87828 Personal history of other (healed) physical injury and trauma: Secondary | ICD-10-CM | POA: Insufficient documentation

## 2014-08-27 DIAGNOSIS — Z8659 Personal history of other mental and behavioral disorders: Secondary | ICD-10-CM | POA: Insufficient documentation

## 2014-08-27 LAB — COMPREHENSIVE METABOLIC PANEL
ALBUMIN: 3 g/dL — AB (ref 3.5–5.2)
ALK PHOS: 146 U/L — AB (ref 39–117)
ALT: 27 U/L (ref 0–53)
ANION GAP: 16 — AB (ref 5–15)
AST: 44 U/L — ABNORMAL HIGH (ref 0–37)
BUN: 8 mg/dL (ref 6–23)
CO2: 26 mEq/L (ref 19–32)
Calcium: 8.6 mg/dL (ref 8.4–10.5)
Chloride: 96 mEq/L (ref 96–112)
Creatinine, Ser: 0.75 mg/dL (ref 0.50–1.35)
GFR calc Af Amer: >90 mL/min (ref >90)
GFR calc non Af Amer: >90 mL/min (ref >90)
Glucose, Bld: 151 mg/dL — ABNORMAL HIGH (ref 70–99)
POTASSIUM: 3.2 meq/L — AB (ref 3.7–5.3)
SODIUM: 138 meq/L (ref 137–147)
TOTAL PROTEIN: 6.5 g/dL (ref 6.0–8.3)
Total Bilirubin: 0.6 mg/dL (ref 0.3–1.2)

## 2014-08-27 LAB — ETHANOL: Alcohol, Ethyl (B): <11 mg/dL (ref 0–11)

## 2014-08-27 LAB — CBC WITH DIFFERENTIAL/PLATELET
Basophils Absolute: 0 10*3/uL (ref 0.0–0.1)
Basophils Relative: 0 % (ref 0–1)
EOS ABS: 0 10*3/uL (ref 0.0–0.7)
Eosinophils Relative: 0 % (ref 0–5)
HCT: 36.7 % — ABNORMAL LOW (ref 39.0–52.0)
Hemoglobin: 13.4 g/dL (ref 13.0–17.0)
Lymphocytes Relative: 20 % (ref 12–46)
Lymphs Abs: 1 10*3/uL (ref 0.7–4.0)
MCH: 33.8 pg (ref 26.0–34.0)
MCHC: 36.5 g/dL — ABNORMAL HIGH (ref 30.0–36.0)
MCV: 92.7 fL (ref 78.0–100.0)
MONOS PCT: 4 % (ref 3–12)
Monocytes Absolute: 0.2 10*3/uL (ref 0.1–1.0)
NEUTROS PCT: 76 % (ref 43–77)
Neutro Abs: 4.1 10*3/uL (ref 1.7–7.7)
PLATELETS: 157 10*3/uL (ref 150–400)
RBC: 3.96 MIL/uL — ABNORMAL LOW (ref 4.22–5.81)
RDW: 14.5 % (ref 11.5–15.5)
WBC: 5.3 10*3/uL (ref 4.0–10.5)

## 2014-08-27 LAB — CBG MONITORING, ED: Glucose-Capillary: 144 mg/dL — ABNORMAL HIGH (ref 70–99)

## 2014-08-27 MED ORDER — PHENYTOIN SODIUM 50 MG/ML IJ SOLN
INTRAMUSCULAR | Status: AC
  Filled 2014-08-27: qty 20

## 2014-08-27 MED ORDER — PHENYTOIN SODIUM EXTENDED 100 MG PO CAPS: ORAL_CAPSULE | ORAL | Status: DC

## 2014-08-27 MED ORDER — SODIUM CHLORIDE 0.9 % IV SOLN
1000.0000 mg | Freq: Once | INTRAVENOUS | Status: AC
  Administered 2014-08-27: 1000 mg via INTRAVENOUS
  Filled 2014-08-27: qty 20

## 2014-08-27 MED ORDER — SODIUM CHLORIDE 0.9 % IV BOLUS (SEPSIS)
1000.0000 mL | Freq: Once | INTRAVENOUS | Status: AC
  Administered 2014-08-27: 1000 mL via INTRAVENOUS

## 2014-08-27 NOTE — ED Notes (Signed)
Pt uncooperative, will not answer questions concerning what happened, pt with dried blood to face and abrasion noted to left cheek, severe tongue bite to right side of tongue

## 2014-08-27 NOTE — Discharge Instructions (Signed)
Take your sz medicine.   Call the social worker next week if you need help with getting your sz medicine

## 2014-08-27 NOTE — ED Notes (Addendum)
Pt brought in EMS. Per EMS, found pt laying on the ground. Pt has several superficial abrasions noted to face. Pt alert and oriented. CBG en route 173. nad noted.Pt reports is not on any seizure medication and reports last seizure "years ago."

## 2014-08-27 NOTE — ED Provider Notes (Signed)
CSN: [id number]     Arrival date & time 08/27/14  1548 History  This chart was scribed for Benny Lennert, MD by Modena Jansky, ED Scribe. This patient was seen in room APA11/APA11 and the patient's care was started at 4:28 PM.   Chief Complaint  Patient presents with  . Seizures   Patient is a 48 y.o. male presenting with seizures. The history is provided by the patient and the EMS personnel. No language interpreter was used.  Seizures Seizure activity on arrival: no   Severity:  Moderate Progression:  Unchanged Context: medical non-compliance   Context: not fever   History of seizures: yes   Severity:  Moderate Current therapy:  Levetiracetam Compliance with current therapy:  Poor  HPI Comments: Paul [name] is a 48 y.o. male with a hx of seizures who presents to the Emergency Department complaining of moderate intermittent seizures. Pt states that he has not been taking his seizure medication, Levetiracetam1000 mg tablet. EMS reports that they found him laying on the ground with several superficial abrasions on his face. He reports some neck pain and cough. He denies any fever. Pt is a poor historian   Past Medical History  Diagnosis Date  . Pancreatitis, acute     First episode earlier in 2012, hospitalized again in 02/2012 and 09/2012  . Hypertension     noncompliance  . Hyperlipidemia     noncompliance  . Lung collapse     h/o  . ETOH abuse   . Cocaine abuse   . DTs (delirium tremens)     history of  . Tobacco abuse   . Seizure disorder 07/17/2013  . Noncompliance 07/17/2013  . Traumatic subdural hematoma 06/15/2013  . 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 Open skull fracture 06/15/2013  . Polysubstance abuse     etoh, cocaine  . Nephrolithiasis   . Seizures    Past Surgical History  Procedure Laterality Date  . Mandible fracture surgery  2006  . Left axillary      surgery for deep laceration   Family History  Problem Relation Age of Onset  .  Diabetes type II Mother   . Liver disease Neg Hx   . Colon cancer Neg Hx   . GI problems Neg Hx    History  Substance Use Topics  . Smoking status: Current Every Day Smoker -- 1.00 packs/day    Types: Cigarettes  . Smokeless tobacco: Not on file  . Alcohol Use: Yes     Comment: 3 bottles of wine per day     Review of Systems  Constitutional: Negative for fever, appetite change and fatigue.  HENT: Negative for congestion, ear discharge and sinus pressure.   Eyes: Negative for discharge.  Respiratory: Positive for cough.   Cardiovascular: Negative for chest pain.  Gastrointestinal: Negative for abdominal pain and diarrhea.  Genitourinary: Negative for frequency and hematuria.  Musculoskeletal: Positive for neck pain. Negative for back pain.  Skin: Negative for rash.  Neurological: Positive for seizures. Negative for headaches.  Psychiatric/Behavioral: Negative for hallucinations.    Allergies  Review of patient's allergies indicates no known allergies.  Home Medications   Prior to Admission medications   Medication Sig Start Date End Date Taking? Authorizing Provider  HYDROcodone-acetaminophen (NORCO/VICODIN) 5-325 MG per tablet Take 1 tablet by mouth every 6 (six) hours as needed. 08/15/14   Benny Lennert, MD  ibuprofen (ADVIL,MOTRIN) 800 MG tablet Take 1 tablet (800 mg total) by mouth every 8 (eight)  hours as needed for moderate pain. 08/22/14   Benny Lennert, MD  levETIRAcetam (KEPPRA) 1000 MG tablet Take 1 tablet (1,000 mg total) by mouth 2 (two) times daily. 08/15/14   Audree Camel, MD  lisinopril (PRINIVIL,ZESTRIL) 20 MG tablet Take 1 tablet (20 mg total) by mouth daily. 08/22/14   Benny Lennert, MD  lisinopril (PRINIVIL,ZESTRIL) 40 MG tablet Take 40 mg by mouth daily.    Historical Provider, MD  penicillin v potassium (VEETID) 500 MG tablet Take 1 tablet (500 mg total) by mouth 4 (four) times daily. 08/22/14 08/29/14  Benny Lennert, MD  phenytoin (DILANTIN) 100 MG  ER capsule Take 3 capsules (300 mg total) by mouth at bedtime. 08/15/14   Audree Camel, MD  potassium chloride (K-DUR) 10 MEQ tablet Take 1 tablet (10 mEq total) by mouth 2 (two) times daily. 08/17/14   Vanetta Mulders, MD  ranitidine (ZANTAC) 150 MG capsule Take 1 capsule (150 mg total) by mouth daily. 08/15/14   Benny Lennert, MD   BP 148/110  Pulse 116  Temp(Src) 98.1 F (36.7 C) (Oral)  Resp 15  Ht 6' (1.829 m)  Wt 160 lb (72.576 kg)  BMI 21.70 kg/m2  SpO2 100% Physical Exam  Nursing note and vitals reviewed. Constitutional: He is oriented to person, place, and time. He appears well-developed and well-nourished. No distress.  HENT:  Head: Normocephalic and atraumatic.  Right Ear: External ear normal.  Left Ear: External ear normal.  Cut on the right side of tongue.    Eyes: Conjunctivae and EOM are normal. Right eye exhibits no discharge. Left eye exhibits no discharge. No scleral icterus.  Neck: Neck supple. No tracheal deviation present. No thyromegaly present.  Cardiovascular: Normal rate, regular rhythm and intact distal pulses.  Exam reveals no gallop and no friction rub.   No murmur heard. Pulmonary/Chest: Effort normal and breath sounds normal. No stridor. No respiratory distress. He has no wheezes. He has no rales. He exhibits no tenderness.  Abdominal: Soft. Bowel sounds are normal. He exhibits no distension. There is no tenderness. There is no rebound and no guarding.  Musculoskeletal: Normal range of motion. He exhibits tenderness. He exhibits no edema.  Some TTP on neck.    Lymphadenopathy:    He has no cervical adenopathy.  Neurological: He is alert and oriented to person, place, and time. He has normal strength. No cranial nerve deficit (no facial droop, extraocular movements intact, no slurred speech) or sensory deficit. He exhibits normal muscle tone. He displays no seizure activity. Coordination normal.  Skin: Skin is warm and dry. No rash noted. No erythema.   Abrasions to face and neck.  Psychiatric: He has a normal mood and affect. His behavior is normal.    ED Course  Procedures (including critical care time) DIAGNOSTIC STUDIES: Oxygen Saturation is 100% on RA, normal by my interpretation.    COORDINATION OF CARE: 4:32 PM- Pt advised of plan for treatment which includes medication, radiology, and labs and pt agrees.  Labs Review Labs Reviewed  CBC WITH DIFFERENTIAL - Abnormal; Notable for the following:    RBC 3.96 (*)    HCT 36.7 (*)    MCHC 36.5 (*)    All other components within normal limits  COMPREHENSIVE METABOLIC PANEL - Abnormal; Notable for the following:    Potassium 3.2 (*)    Glucose, Bld 151 (*)    Albumin 3.0 (*)    AST 44 (*)    Alkaline Phosphatase  146 (*)    Anion gap 16 (*)    All other components within normal limits  CBG MONITORING, ED - Abnormal; Notable for the following:    Glucose-Capillary 144 (*)    All other components within normal limits  ETHANOL    Imaging Review Ct Head Wo Contrast  08/27/2014   CLINICAL DATA:  48 year old male with seizure, fall, headache and neck pain.  EXAM: CT HEAD WITHOUT CONTRAST  CT CERVICAL SPINE WITHOUT CONTRAST  TECHNIQUE: Multidetector CT imaging of the head and cervical spine was performed following the standard protocol without intravenous contrast. Multiplanar CT image reconstructions of the cervical spine were also generated.  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  Mild chronic small-vessel white matter ischemic changes are again noted.  No acute intracranial abnormalities are identified, including mass lesion or mass effect, hydrocephalus, extra-axial fluid collection, midline shift, hemorrhage, or acute infarction. The visualized bony calvarium is unremarkable.  CT CERVICAL SPINE FINDINGS  Straightening of the normal cervical lordosis again noted.  There is no  evidence of acute fracture, subluxation or prevertebral soft tissue swelling.  Moderate degenerative disc disease spondylosis from C3-C7 again noted contributing to mild to moderate central spinal narrowing and moderate bony foraminal narrowing at these levels.  No focal bony lesions are identified.  The soft tissue structures are unremarkable.  IMPRESSION: No evidence of acute intracranial abnormality.  No static evidence of acute injury to the cervical spine.  Moderate degenerative changes from C3-C7 contributing to mild to moderate central spinal narrowing and moderate bony foraminal narrowing at these levels.   Electronically Signed   By: Laveda Abbe M.D.   On: 08/27/2014 18:00   Ct Cervical Spine Wo Contrast  08/27/2014   CLINICAL DATA:  48 year old male with seizure, fall, headache and neck pain.  EXAM: CT HEAD WITHOUT CONTRAST  CT CERVICAL SPINE WITHOUT CONTRAST  TECHNIQUE: Multidetector CT imaging of the head and cervical spine was performed following the standard protocol without intravenous contrast. Multiplanar CT image reconstructions of the cervical spine were also generated.  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  Mild chronic small-vessel white matter ischemic changes are again noted.  No acute intracranial abnormalities are identified, including mass lesion or mass effect, hydrocephalus, extra-axial fluid collection, midline shift, hemorrhage, or acute infarction. The visualized bony calvarium is unremarkable.  CT CERVICAL SPINE FINDINGS  Straightening of the normal cervical lordosis again noted.  There is no evidence of acute fracture, subluxation or prevertebral soft tissue swelling.  Moderate degenerative disc disease spondylosis from C3-C7 again noted contributing to mild to moderate central spinal narrowing and moderate bony foraminal narrowing at these levels.  No focal bony lesions are  identified.  The soft tissue structures are unremarkable.  IMPRESSION: No evidence of acute intracranial abnormality.  No static evidence of acute injury to the cervical spine.  Moderate degenerative changes from C3-C7 contributing to mild to moderate central spinal narrowing and moderate bony foraminal narrowing at these levels.   Electronically Signed   By: Laveda Abbe M.D.   On: 08/27/2014 18:00     EKG Interpretation None      MDM   Final diagnoses:  None    Sz.  Will give pt prescription for dilantin and information about social worker  The  chart was scribed for me under my direct supervision.  I personally performed the history, physical, and medical decision making and all procedures in the evaluation of this patient.Benny Lennert, MD 08/27/14 2013

## 2014-08-27 NOTE — ED Notes (Signed)
Pt refused to have abrasion cleaned to face and refused to clean self; social worker number provided for pt concerning seizure medication, also message left for social worker to contact pt concerning pt getting med filled

## 2014-08-29 NOTE — Care Management Note (Signed)
Pt called CSW about inability to purchase Dilantin and she referred message to me. Returned call to pt and he is requesting assistance with dilantin Rx he was given in ED over the weekend. Reviewed chart and pt is here frequently- 5 times in past 2 weeks, and  Frequently admits to ETOH, and drug abuse but cannot afford or does not buy his regular medications. He also has no PCP, but has been given the [employer] many times. Pt does qualify for the [facility] assistance program. It has been over a year since his last use of this program. Returned call to pt. and explained the program. $3 cost and only once a year. Also explained to pt that he is positive for using  other drugs that are not compatible with seizures, and  If he stopped using these drugs, his health would probably improve, and he could use the money saved to continue to buy his Rx Dilantin and continue to improve his health and hopefully prevent the frequent seizures. He agreed and was appreciative of the medication voucher. Pt is to pick up the voucher at the front desk of the ED, in sealed envelope

## 2014-08-31 ENCOUNTER — Encounter (HOSPITAL_COMMUNITY): Payer: Self-pay | Admitting: Emergency Medicine

## 2014-08-31 ENCOUNTER — Inpatient Hospital Stay (HOSPITAL_COMMUNITY)
Admission: EM | Admit: 2014-08-31 | Discharge: 2014-09-01 | DRG: 389 | Discharge status: Against Medical Advice | Payer: Self-pay | Attending: Family Medicine | Admitting: Family Medicine

## 2014-08-31 ENCOUNTER — Emergency Department (HOSPITAL_COMMUNITY): Payer: Self-pay

## 2014-08-31 DIAGNOSIS — Z9114 Patient's other noncompliance with medication regimen: Secondary | ICD-10-CM

## 2014-08-31 DIAGNOSIS — K860 Alcohol-induced chronic pancreatitis: Secondary | ICD-10-CM

## 2014-08-31 DIAGNOSIS — Z833 Family history of diabetes mellitus: Secondary | ICD-10-CM

## 2014-08-31 DIAGNOSIS — F1410 Cocaine abuse, uncomplicated: Secondary | ICD-10-CM | POA: Diagnosis present

## 2014-08-31 DIAGNOSIS — F17200 Nicotine dependence, unspecified, uncomplicated: Secondary | ICD-10-CM | POA: Diagnosis present

## 2014-08-31 DIAGNOSIS — Z9119 Patient's noncompliance with other medical treatment and regimen: Secondary | ICD-10-CM

## 2014-08-31 DIAGNOSIS — F10229 Alcohol dependence with intoxication, unspecified: Secondary | ICD-10-CM | POA: Diagnosis present

## 2014-08-31 DIAGNOSIS — Z91199 Patient's noncompliance with other medical treatment and regimen due to unspecified reason: Secondary | ICD-10-CM

## 2014-08-31 DIAGNOSIS — I10 Essential (primary) hypertension: Secondary | ICD-10-CM | POA: Diagnosis present

## 2014-08-31 DIAGNOSIS — K56600 Partial intestinal obstruction, unspecified as to cause: Secondary | ICD-10-CM

## 2014-08-31 DIAGNOSIS — G40909 Epilepsy, unspecified, not intractable, without status epilepticus: Secondary | ICD-10-CM | POA: Diagnosis present

## 2014-08-31 DIAGNOSIS — E785 Hyperlipidemia, unspecified: Secondary | ICD-10-CM | POA: Diagnosis present

## 2014-08-31 DIAGNOSIS — R569 Unspecified convulsions: Secondary | ICD-10-CM | POA: Diagnosis present

## 2014-08-31 DIAGNOSIS — K56609 Unspecified intestinal obstruction, unspecified as to partial versus complete obstruction: Secondary | ICD-10-CM

## 2014-08-31 DIAGNOSIS — F10920 Alcohol use, unspecified with intoxication, uncomplicated: Secondary | ICD-10-CM

## 2014-08-31 DIAGNOSIS — F1010 Alcohol abuse, uncomplicated: Secondary | ICD-10-CM

## 2014-08-31 DIAGNOSIS — Z91148 Patient's other noncompliance with medication regimen for other reason: Secondary | ICD-10-CM

## 2014-08-31 DIAGNOSIS — K560 Paralytic ileus: Principal | ICD-10-CM | POA: Diagnosis present

## 2014-08-31 DIAGNOSIS — K567 Ileus, unspecified: Secondary | ICD-10-CM | POA: Diagnosis present

## 2014-08-31 DIAGNOSIS — Z87898 Personal history of other specified conditions: Secondary | ICD-10-CM

## 2014-08-31 DIAGNOSIS — S01502D Unspecified open wound of oral cavity, subsequent encounter: Secondary | ICD-10-CM

## 2014-08-31 DIAGNOSIS — F10929 Alcohol use, unspecified with intoxication, unspecified: Secondary | ICD-10-CM

## 2014-08-31 DIAGNOSIS — K861 Other chronic pancreatitis: Secondary | ICD-10-CM | POA: Diagnosis present

## 2014-08-31 DIAGNOSIS — D61818 Other pancytopenia: Secondary | ICD-10-CM | POA: Diagnosis present

## 2014-08-31 LAB — COMPREHENSIVE METABOLIC PANEL
ALK PHOS: 110 U/L (ref 39–117)
ALT: 14 U/L (ref 0–53)
AST: 21 U/L (ref 0–37)
Albumin: 2.8 g/dL — ABNORMAL LOW (ref 3.5–5.2)
Anion gap: 13 (ref 5–15)
BILIRUBIN TOTAL: 0.2 mg/dL — AB (ref 0.3–1.2)
BUN: 7 mg/dL (ref 6–23)
CHLORIDE: 101 meq/L (ref 96–112)
CO2: 28 meq/L (ref 19–32)
Calcium: 8 mg/dL — ABNORMAL LOW (ref 8.4–10.5)
Creatinine, Ser: 0.79 mg/dL (ref 0.50–1.35)
GFR calc non Af Amer: >90 mL/min (ref >90)
GLUCOSE: 117 mg/dL — AB (ref 70–99)
POTASSIUM: 3.8 meq/L (ref 3.7–5.3)
Sodium: 142 mEq/L (ref 137–147)
Total Protein: 6.4 g/dL (ref 6.0–8.3)

## 2014-08-31 LAB — CBC WITH DIFFERENTIAL/PLATELET
Basophils Absolute: 0 10*3/uL (ref 0.0–0.1)
Basophils Relative: 0 % (ref 0–1)
EOS ABS: 0 10*3/uL (ref 0.0–0.7)
Eosinophils Relative: 1 % (ref 0–5)
HCT: 34.4 % — ABNORMAL LOW (ref 39.0–52.0)
HEMOGLOBIN: 12.4 g/dL — AB (ref 13.0–17.0)
LYMPHS PCT: 58 % — AB (ref 12–46)
Lymphs Abs: 2.7 10*3/uL (ref 0.7–4.0)
MCH: 33.8 pg (ref 26.0–34.0)
MCHC: 36 g/dL (ref 30.0–36.0)
MCV: 93.7 fL (ref 78.0–100.0)
MONOS PCT: 6 % (ref 3–12)
Monocytes Absolute: 0.3 10*3/uL (ref 0.1–1.0)
NEUTROS ABS: 1.6 10*3/uL — AB (ref 1.7–7.7)
Neutrophils Relative %: 35 % — ABNORMAL LOW (ref 43–77)
Platelets: 130 10*3/uL — ABNORMAL LOW (ref 150–400)
RBC: 3.67 MIL/uL — AB (ref 4.22–5.81)
RDW: 15.1 % (ref 11.5–15.5)
WBC: 4.6 10*3/uL (ref 4.0–10.5)

## 2014-08-31 LAB — URINALYSIS, ROUTINE W REFLEX MICROSCOPIC
Bilirubin Urine: NEGATIVE
GLUCOSE, UA: NEGATIVE mg/dL
HGB URINE DIPSTICK: NEGATIVE
Ketones, ur: NEGATIVE mg/dL
Leukocytes, UA: NEGATIVE
Nitrite: NEGATIVE
PH: 5.5 (ref 5.0–8.0)
PROTEIN: NEGATIVE mg/dL
Specific Gravity, Urine: 1.01 (ref 1.005–1.030)
Urobilinogen, UA: 0.2 mg/dL (ref 0.0–1.0)

## 2014-08-31 LAB — LIPASE, BLOOD: Lipase: 83 U/L — ABNORMAL HIGH (ref 11–59)

## 2014-08-31 LAB — ETHANOL: Alcohol, Ethyl (B): 304 mg/dL — ABNORMAL HIGH (ref 0–11)

## 2014-08-31 LAB — PHENYTOIN LEVEL, TOTAL: PHENYTOIN LVL: 4.4 ug/mL — AB (ref 10.0–20.0)

## 2014-08-31 MED ORDER — LORAZEPAM 1 MG PO TABS
0.0000 mg | ORAL_TABLET | Freq: Four times a day (QID) | ORAL | Status: DC
  Administered 2014-09-01 (×2): 1 mg via ORAL
  Administered 2014-09-01: 4 mg via ORAL
  Filled 2014-08-31: qty 4
  Filled 2014-08-31 (×2): qty 1

## 2014-08-31 MED ORDER — PHENYTOIN SODIUM 50 MG/ML IJ SOLN
INTRAMUSCULAR | Status: AC
  Filled 2014-08-31: qty 10

## 2014-08-31 MED ORDER — SODIUM CHLORIDE 0.9 % IJ SOLN
3.0000 mL | Freq: Two times a day (BID) | INTRAMUSCULAR | Status: DC
  Administered 2014-08-31 – 2014-09-01 (×2): 3 mL via INTRAVENOUS

## 2014-08-31 MED ORDER — SODIUM CHLORIDE 0.9 % IV SOLN
1000.0000 mg | Freq: Once | INTRAVENOUS | Status: DC
  Filled 2014-08-31: qty 20

## 2014-08-31 MED ORDER — ONDANSETRON HCL 4 MG/2ML IJ SOLN
4.0000 mg | Freq: Four times a day (QID) | INTRAMUSCULAR | Status: DC | PRN
  Administered 2014-08-31: 4 mg via INTRAVENOUS
  Filled 2014-08-31: qty 2

## 2014-08-31 MED ORDER — M.V.I. ADULT IV INJ
INJECTION | INTRAVENOUS | Status: AC
  Filled 2014-08-31: qty 10

## 2014-08-31 MED ORDER — SODIUM CHLORIDE 0.9 % IV SOLN
500.0000 mg | Freq: Once | INTRAVENOUS | Status: AC
  Administered 2014-08-31: 500 mg via INTRAVENOUS
  Filled 2014-08-31: qty 10

## 2014-08-31 MED ORDER — FOLIC ACID 5 MG/ML IJ SOLN
INTRAMUSCULAR | Status: AC
  Filled 2014-08-31: qty 0.2

## 2014-08-31 MED ORDER — LORAZEPAM 1 MG PO TABS: 0.0000 mg | ORAL_TABLET | Freq: Two times a day (BID) | ORAL | Status: DC

## 2014-08-31 MED ORDER — PHENYTOIN SODIUM EXTENDED 100 MG PO CAPS
300.0000 mg | ORAL_CAPSULE | Freq: Once | ORAL | Status: AC
  Administered 2014-08-31: 300 mg via ORAL
  Filled 2014-08-31: qty 3

## 2014-08-31 MED ORDER — THIAMINE HCL 100 MG/ML IJ SOLN
Freq: Once | INTRAVENOUS | Status: AC
  Administered 2014-08-31: via INTRAVENOUS
  Filled 2014-08-31: qty 1000

## 2014-08-31 MED ORDER — LISINOPRIL 10 MG PO TABS
20.0000 mg | ORAL_TABLET | Freq: Every day | ORAL | Status: DC
  Administered 2014-09-01: 20 mg via ORAL
  Filled 2014-08-31: qty 2

## 2014-08-31 MED ORDER — LORAZEPAM 1 MG PO TABS: 1.0000 mg | ORAL_TABLET | Freq: Four times a day (QID) | ORAL | Status: DC | PRN

## 2014-08-31 MED ORDER — THIAMINE HCL 100 MG/ML IJ SOLN
INTRAMUSCULAR | Status: AC
  Filled 2014-08-31: qty 2

## 2014-08-31 MED ORDER — SODIUM CHLORIDE 0.9 % IV BOLUS (SEPSIS)
1000.0000 mL | Freq: Once | INTRAVENOUS | Status: AC
  Administered 2014-08-31: 1000 mL via INTRAVENOUS

## 2014-08-31 MED ORDER — AMOXICILLIN-POT CLAVULANATE 875-125 MG PO TABS
1.0000 | ORAL_TABLET | Freq: Two times a day (BID) | ORAL | Status: DC
  Administered 2014-08-31 – 2014-09-01 (×2): 1 via ORAL
  Filled 2014-08-31 (×2): qty 1

## 2014-08-31 MED ORDER — PHENYTOIN SODIUM EXTENDED 100 MG PO CAPS
300.0000 mg | ORAL_CAPSULE | Freq: Every day | ORAL | Status: DC
  Administered 2014-08-31: 300 mg via ORAL
  Filled 2014-08-31: qty 3

## 2014-08-31 MED ORDER — LEVETIRACETAM 500 MG PO TABS
1000.0000 mg | ORAL_TABLET | Freq: Two times a day (BID) | ORAL | Status: DC
  Administered 2014-08-31 – 2014-09-01 (×2): 1000 mg via ORAL
  Filled 2014-08-31 (×2): qty 2

## 2014-08-31 MED ORDER — ACETAMINOPHEN 325 MG PO TABS
650.0000 mg | ORAL_TABLET | Freq: Once | ORAL | Status: AC
  Administered 2014-08-31: 650 mg via ORAL
  Filled 2014-08-31: qty 2

## 2014-08-31 MED ORDER — ONDANSETRON HCL 4 MG PO TABS: 4.0000 mg | ORAL_TABLET | Freq: Four times a day (QID) | ORAL | Status: DC | PRN

## 2014-08-31 MED ORDER — HEPARIN SODIUM (PORCINE) 5000 UNIT/ML IJ SOLN
5000.0000 [IU] | Freq: Three times a day (TID) | INTRAMUSCULAR | Status: DC
  Administered 2014-08-31: 5000 [IU] via SUBCUTANEOUS
  Filled 2014-08-31 (×2): qty 1

## 2014-08-31 MED ORDER — LORAZEPAM 2 MG/ML IJ SOLN
1.0000 mg | Freq: Four times a day (QID) | INTRAMUSCULAR | Status: DC | PRN
  Administered 2014-08-31 – 2014-09-01 (×2): 1 mg via INTRAVENOUS
  Filled 2014-08-31 (×2): qty 1

## 2014-08-31 NOTE — H&P (Signed)
[facility] History and Physical  Paul [name] ZOX:[id number] DOB: 1966/12/10    PCP:   He has no PCP at this time.  Chief Complaint: here to have his lacerated tongue checked, but admitted for an ileus.  HPI: Paul [name] is an 48 y.o. male with significant alcohol abuse (4 bottles of 750cc red wine per day), hx of seizure and medication non compliance, last seizure about a month ago, bit his tongue and lacerated it, not sutured due to delay presentation to the ER about one week, presented today to the ER to have his tongue checked, and complaints of having abdominal pain, nausea, and vomiting for about a week.  He had a normal bowel movement this am, and still passes flatus.  Further evaluation in the ER showed alcohol level of 304 mg per dL, lipase of 83, and normal LFTs.  His abdominal pelvic CT without contrast showed dilated small bowel, indicative of either ileus or SBO.  His dilantin level was 4, and he originally refused IV, so he was given one dose of 300 mg of dilantin.  Hospitalist was asked to admit him for ileus vs SBO.  Rewiew of Systems:  Constitutional: Negative for malaise, fever and chills. No significant weight loss or weight gain Eyes: Negative for eye pain, redness and discharge, diplopia, visual changes, or flashes of light. ENMT: Negative for ear pain, hoarseness, nasal congestion, sinus pressure and sore throat. No headaches; tinnitus, drooling, or problem swallowing. Cardiovascular: Negative for chest pain, palpitations, diaphoresis, dyspnea and peripheral edema. ; No orthopnea, PND Respiratory: Negative for cough, hemoptysis, wheezing and stridor. No pleuritic chestpain. Gastrointestinal: Negative for  abdominal pain, melena, blood in stool, hematemesis, jaundice and rectal bleeding.    Genitourinary: Negative for frequency, dysuria, incontinence,flank pain and hematuria; Musculoskeletal: Negative for back pain and neck pain. Negative for swelling and  trauma.;  Skin: . Negative for pruritus, rash, abrasions, bruising and skin lesion.; ulcerations Neuro: Negative for headache, lightheadedness and neck stiffness. Negative for weakness, altered level of consciousness , altered mental status, extremity weakness, burning feet, involuntary movement,  and syncope.  Psych: negative for anxiety, depression, insomnia, tearfulness, panic attacks, hallucinations, paranoia, suicidal or homicidal ideation    Past Medical History  Diagnosis Date  . Pancreatitis, acute     First episode earlier in 2012, hospitalized again in 02/2012 and 09/2012  . Hypertension     noncompliance  . Hyperlipidemia     noncompliance  . Lung collapse     h/o  . ETOH abuse   . Cocaine abuse   . DTs (delirium tremens)     history of  . Tobacco abuse   . Seizure disorder 07/17/2013  . Noncompliance 07/17/2013  . Traumatic subdural hematoma 06/15/2013  . 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 Open skull fracture 06/15/2013  . Polysubstance abuse     etoh, cocaine  . Nephrolithiasis   . Seizures     Past Surgical History  Procedure Laterality Date  . Mandible fracture surgery  2006  . Left axillary      surgery for deep laceration    Medications:  HOME MEDS: Prior to Admission medications   Medication Sig Start Date End Date Taking? Authorizing Provider  levETIRAcetam (KEPPRA) 1000 MG tablet Take 1 tablet (1,000 mg total) by mouth 2 (two) times daily. 08/15/14  Yes Audree Camel, MD  lisinopril (PRINIVIL,ZESTRIL) 20 MG tablet Take 1 tablet (20 mg total) by mouth daily. 08/22/14  Yes Benny Lennert, MD  phenytoin (DILANTIN) 100 MG ER capsule Take 3 capsules (300 mg total) by mouth at bedtime. 08/15/14  Yes Audree Camel, MD     Allergies:  No Known Allergies  Social History:   reports that he has been smoking Cigarettes.  He has been smoking about 1.00 pack per day. He does not have any smokeless tobacco history on file. He reports that he  drinks alcohol. He reports that he uses illicit drugs (Cocaine) about 3 times per week.  Family History: Family History  Problem Relation Age of Onset  . Diabetes type II Mother   . Liver disease Neg Hx   . Colon cancer Neg Hx   . GI problems Neg Hx      Physical Exam: Filed Vitals:   08/31/14 1417 08/31/14 1738  BP: 144/103 153/111  Pulse: 105 94  Temp: 98.3 F (36.8 C)   TempSrc: Oral   Resp: 14 16  Height: 6' (1.829 m)   Weight: 72.576 kg (160 lb)   SpO2: 100% 100%   Blood pressure 153/111, pulse 94, temperature 98.3 F (36.8 C), temperature source Oral, resp. rate 16, height 6' (1.829 m), weight 72.576 kg (160 lb), SpO2 100.00%.  GEN:  Pleasant  patient lying in the stretcher in no acute distress; cooperative with exam. PSYCH:  alert and oriented x4; does not appear anxious or depressed; affect is appropriate. HEENT: Mucous membranes pink and anicteric; PERRLA; EOM intact; no cervical lymphadenopathy nor thyromegaly or carotid bruit; no JVD; There were no stridor. Neck is very supple. Moderate laceration on the right lateral side.  A little purulent discharge. Breasts:: Not examined CHEST WALL: No tenderness CHEST: Normal respiration, clear to auscultation bilaterally.  HEART: Regular rate and rhythm.  There are no murmur, rub, or gallops.   BACK: No kyphosis or scoliosis; no CVA tenderness ABDOMEN: soft and non-tender; no masses, no organomegaly, decrease BS,  no pannus; no intertriginous candida. There is no rebound and no distention. Rectal Exam: Not done EXTREMITIES: No bone or joint deformity; age-appropriate arthropathy of the hands and knees; no edema; no ulcerations.  There is no calf tenderness. Genitalia: not examined PULSES: 2+ and symmetric SKIN: Normal hydration no rash or ulceration CNS: Cranial nerves 2-12 grossly intact no focal lateralizing neurologic deficit.  Speech is fluent; uvula elevated with phonation, facial symmetry and tongue midline. DTR are  normal bilaterally, cerebella exam is intact, barbinski is negative and strengths are equaled bilaterally.  No sensory loss.   Labs on Admission:  Basic Metabolic Panel:  Recent Labs Lab 08/27/14 1652 08/31/14 1642  NA 138 142  K 3.2* 3.8  CL 96 101  CO2 26 28  GLUCOSE 151* 117*  BUN 8 7  CREATININE 0.75 0.79  CALCIUM 8.6 8.0*   Liver Function Tests:  Recent Labs Lab 08/27/14 1652 08/31/14 1642  AST 44* 21  ALT 27 14  ALKPHOS 146* 110  BILITOT 0.6 0.2*  PROT 6.5 6.4  ALBUMIN 3.0* 2.8*    Recent Labs Lab 08/31/14 1642  LIPASE 83*   No results found for this basename: AMMONIA,  in the last 168 hours CBC:  Recent Labs Lab 08/27/14 1652 08/31/14 1642  WBC 5.3 4.6  NEUTROABS 4.1 1.6*  HGB 13.4 12.4*  HCT 36.7* 34.4*  MCV 92.7 93.7  PLT 157 130*   Cardiac Enzymes: No results found for this basename: CKTOTAL, CKMB, CKMBINDEX, TROPONINI,  in the last 168 hours  CBG:  Recent Labs Lab 08/27/14 1605  GLUCAP 144*  Radiological Exams on Admission: Ct Abdomen Pelvis Wo Contrast  08/31/2014   CLINICAL DATA:  Abdominal pain  EXAM: CT ABDOMEN AND PELVIS WITHOUT CONTRAST  TECHNIQUE: Multidetector CT imaging of the abdomen and pelvis was performed following the standard protocol without IV contrast.  COMPARISON:  05/17/2014  FINDINGS: Lung bases are free of acute infiltrate or sizable effusion.  The liver, gallbladder, spleen and adrenal glands are within normal limits and stable from the prior exam. Changes of chronic pancreatitis are again seen with multiple cystic areas identified particularly in the body and head of the pancreas. The overall appearance is stable. Evaluation is somewhat limited due to lack of IV contrast. The kidneys are well visualized and within normal limits. No obstructive changes are seen. The appendix is within normal limits. Mild dilatation of proximal small bowel is seen. Air is noted within the colon. These changes may represent a focal  ileus. The bladder is well distended. No pelvic mass lesion or sidewall abnormality is noted. 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 Old fractures are noted in the posterior left rib cage.  IMPRESSION: Small bowel dilatation which may represent an ileus or partial small bowel obstruction. Correlation with the physical exam is recommended.  Chronic changes without other acute abnormality.   Electronically Signed   By: Alcide Clever M.D.   On: 08/31/2014 19:30   Ct Head Wo Contrast  08/31/2014   CLINICAL DATA:  Altered mental status, intoxication  EXAM: CT HEAD WITHOUT CONTRAST  TECHNIQUE: Contiguous axial images were obtained from the base of the skull through the vertex without intravenous contrast.  COMPARISON:  08/27/2014  FINDINGS: The bony calvarium is intact. The ventricles are of normal size and configuration. No findings to suggest acute hemorrhage, acute infarction or space-occupying mass lesion are noted.  IMPRESSION: No acute intracranial abnormality noted.   Electronically Signed   By: Alcide Clever M.D.   On: 08/31/2014 19:24    Assessment/Plan Present on Admission:  . Ileus . Alcohol abuse . Cocaine abuse . Convulsions/seizures . Hypertension . Chronic pancreatitis due to acute alcohol intoxication  PLAN:  This patient has some abdominal pain, nausea and vomiting, and has dilated small bowel, but had a normal bowel movement this am, and still passes flatus, so I don't think he has a complete bowel obstruction.   It is likely to be an ileus.  He also has some alcoholic pancreatitis which may be contributing to his ileus as well.   Will give bowel rest, IVF, and antiemetics.  He drinks significantly, and is at risk for withdrawal, especially with his hx of seizure, so I will give him scheduled ativan along with CIWA.  He will get the "banana bag" if there is no short supply of it.  For his seizure medication, will continue  with Keppra, and give a small bolus of  IV, since he had  orally in the ER. Will continue with his same home dose, recheck another level of dilantin after bolus.  He is stable, full code, and will be admitted to telemetry under [facility].  I advised him about stopping alcohol use, and to be compliance with his ACD.  His tongue is beginning to get some infection, so will start him on Augmentin.    Other plans as per orders.  Code Status: FULL Unk Lightning, MD. [facility] Pager [phone] 7pm to 7am.  08/31/2014, 8:21 PM

## 2014-08-31 NOTE — ED Notes (Signed)
Report given to [facility], all questions answered.

## 2014-08-31 NOTE — ED Notes (Signed)
Patient waiting quietly without any complaints 

## 2014-08-31 NOTE — ED Notes (Signed)
Took IV out and I turned off pump by orders of nurse

## 2014-08-31 NOTE — ED Provider Notes (Addendum)
CSN: [id number]     Arrival date & time 08/31/14  1335 History  This chart was scribed for Glynn Octave, MD by Bronson Curb, ED Scribe. This patient was seen in room APA10/APA10 and the patient's care was started at 4:25 PM.     Chief Complaint  Patient presents with  . Wound Check      The history is provided by the patient. No language interpreter was used.    HPI Comments: Paul [name] is a 48 y.o. male who presents to the Emergency Department wound check to the right side of his tongue. Patient was seen here 3 days ago for seizures where he bit the right side of his tongue.  There is associated pain tor right tongue, epigastric abdominal pain, and emesis ("every single night"). He also reports neck pain but states this is chronic. Patient states he is compliant with his seizure medication. He denies fever. Patient has history of pancreatitis, HTN, hyperlipidemia, lung collapse, and seizures. Patient reports EtOH consumption PTA. Patient reports he does not drink everyday, and states he does get tremors when he does not have a drink.   Past Medical History  Diagnosis Date  . Pancreatitis, acute     First episode earlier in 2012, hospitalized again in 02/2012 and 09/2012  . Hypertension     noncompliance  . Hyperlipidemia     noncompliance  . Lung collapse     h/o  . ETOH abuse   . Cocaine abuse   . DTs (delirium tremens)     history of  . Tobacco abuse   . Seizure disorder 07/17/2013  . Noncompliance 07/17/2013  . Traumatic subdural hematoma 06/15/2013  . 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 Open skull fracture 06/15/2013  . Polysubstance abuse     etoh, cocaine  . Nephrolithiasis   . Seizures    Past Surgical History  Procedure Laterality Date  . Mandible fracture surgery  2006  . Left axillary      surgery for deep laceration   Family History  Problem Relation Age of Onset  . Diabetes type II Mother   . Liver disease Neg Hx   . Colon cancer  Neg Hx   . GI problems Neg Hx    History  Substance Use Topics  . Smoking status: Current Every Day Smoker -- 1.00 packs/day    Types: Cigarettes  . Smokeless tobacco: Not on file  . Alcohol Use: Yes     Comment: 3 bottles of wine per day     Review of Systems A complete 10 system review of systems was obtained and all systems are negative except as noted in the HPI and PMH.    Allergies  Review of patient's allergies indicates no known allergies.  Home Medications   Prior to Admission medications   Medication Sig Start Date End Date Taking? Authorizing Provider  levETIRAcetam (KEPPRA) 1000 MG tablet Take 1 tablet (1,000 mg total) by mouth 2 (two) times daily. 08/15/14  Yes Audree Camel, MD  lisinopril (PRINIVIL,ZESTRIL) 20 MG tablet Take 1 tablet (20 mg total) by mouth daily. 08/22/14  Yes Benny Lennert, MD  phenytoin (DILANTIN) 100 MG ER capsule Take 3 capsules (300 mg total) by mouth at bedtime. 08/15/14  Yes Audree Camel, MD   Triage Vitals: BP 144/103  Pulse 105  Temp(Src) 98.3 F (36.8 C) (Oral)  Resp 14  Ht 6' (1.829 m)  Wt 160 lb (72.576 kg)  BMI 21.70 kg/m2  SpO2 100%  Physical Exam  Nursing note and vitals reviewed. Constitutional: He is oriented to person, place, and time. He appears well-developed and well-nourished. No distress.  Smells of alcohol.  HENT:  Head: Normocephalic and atraumatic.  Mouth/Throat: Oropharynx is clear and moist. No oropharyngeal exudate.  Bite wound to right tongue with tissue missing. No fluctuance. See photo.  Eyes: Conjunctivae and EOM are normal. Pupils are equal, round, and reactive to light.  Neck: Normal range of motion. Neck supple.  No meningismus.  Cardiovascular: Normal rate, regular rhythm, normal heart sounds and intact distal pulses.   No murmur heard. Pulmonary/Chest: Effort normal and breath sounds normal. No respiratory distress.  Abdominal: Soft. There is tenderness in the epigastric area. There is no  rebound and no guarding.  Musculoskeletal: Normal range of motion. He exhibits no edema and no tenderness.  Neurological: He is alert and oriented to person, place, and time. No cranial nerve deficit. He exhibits normal muscle tone. Coordination normal.  No ataxia on finger to nose bilaterally. No pronator drift. 5/5 strength throughout. CN 2-12 intact. Negative Romberg. Equal grip strength. Sensation intact. Gait is normal.   Skin: Skin is warm.  Psychiatric: He has a normal mood and affect. His behavior is normal.      ED Course  Procedures (including critical care time)  DIAGNOSTIC STUDIES: Oxygen Saturation is 100% on room air, normal by my interpretation.    COORDINATION OF CARE: At 1630 Discussed treatment plan with patient which includes labs. Patient agrees.   At 1732 Informed patient of consult with oral surgery and discussed the need to be evaluated in the next week. Also discussed with patient the results of labs and informed he will be given Dilantin due to low levels. Patient agrees.  Labs Review Labs Reviewed  CBC WITH DIFFERENTIAL - Abnormal; Notable for the following:    RBC 3.67 (*)    Hemoglobin 12.4 (*)    HCT 34.4 (*)    Platelets 130 (*)    Neutrophils Relative % 35 (*)    Neutro Abs 1.6 (*)    Lymphocytes Relative 58 (*)    All other components within normal limits  COMPREHENSIVE METABOLIC PANEL - Abnormal; Notable for the following:    Glucose, Bld 117 (*)    Calcium 8.0 (*)    Albumin 2.8 (*)    Total Bilirubin 0.2 (*)    All other components within normal limits  ETHANOL - Abnormal; Notable for the following:    Alcohol, Ethyl (B) 304 (*)    All other components within normal limits  LIPASE, BLOOD - Abnormal; Notable for the following:    Lipase 83 (*)    All other components within normal limits  PHENYTOIN LEVEL, TOTAL - Abnormal; Notable for the following:    Phenytoin Lvl 4.4 (*)    All other components within normal limits  COMPREHENSIVE  METABOLIC PANEL - Abnormal; Notable for the following:    Potassium 3.4 (*)    Calcium 7.0 (*)    Total Protein 5.2 (*)    Albumin 2.2 (*)    Total Bilirubin 0.2 (*)    All other components within normal limits  CBC - Abnormal; Notable for the following:    WBC 3.6 (*)    RBC 3.29 (*)    Hemoglobin 11.1 (*)    HCT 31.0 (*)    Platelets 114 (*)    All other components within normal limits  URINALYSIS, ROUTINE W REFLEX MICROSCOPIC  PHENYTOIN  LEVEL, TOTAL  TSH    Imaging Review Ct Abdomen Pelvis Wo Contrast  08/31/2014   CLINICAL DATA:  Abdominal pain  EXAM: CT ABDOMEN AND PELVIS WITHOUT CONTRAST  TECHNIQUE: Multidetector CT imaging of the abdomen and pelvis was performed following the standard protocol without IV contrast.  COMPARISON:  05/17/2014  FINDINGS: Lung bases are free of acute infiltrate or sizable effusion.  The liver, gallbladder, spleen and adrenal glands are within normal limits and stable from the prior exam. Changes of chronic pancreatitis are again seen with multiple cystic areas identified particularly in the body and head of the pancreas. The overall appearance is stable. Evaluation is somewhat limited due to lack of IV contrast. The kidneys are well visualized and within normal limits. No obstructive changes are seen. The appendix is within normal limits. Mild dilatation of proximal small bowel is seen. Air is noted within the colon. These changes may represent a focal ileus. The bladder is well distended. No pelvic mass lesion or sidewall abnormality is noted. 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 Old fractures are noted in the posterior left rib cage.  IMPRESSION: Small bowel dilatation which may represent an ileus or partial small bowel obstruction. Correlation with the physical exam is recommended.  Chronic changes without other acute abnormality.   Electronically Signed   By: Alcide Clever M.D.   On:  08/31/2014 19:30   Ct Head Wo Contrast  08/31/2014   CLINICAL DATA:  Altered mental status, intoxication  EXAM: CT HEAD WITHOUT CONTRAST  TECHNIQUE: Contiguous axial images were obtained from the base of the skull through the vertex without intravenous contrast.  COMPARISON:  08/27/2014  FINDINGS: The bony calvarium is intact. The ventricles are of normal size and configuration. No findings to suggest acute hemorrhage, acute infarction or space-occupying mass lesion are noted.  IMPRESSION: No acute intracranial abnormality noted.   Electronically Signed   By: Alcide Clever M.D.   On: 08/31/2014 19:24     EKG Interpretation None      MDM   Final diagnoses:  Partial small bowel obstruction  Tongue wound, subsequent encounter  Alcohol intoxication, uncomplicated   Patient reports pain to right side of tongue after seizures last week. Denies any seizures since last visit on August 29. States compliance with medications. Admits drinking alcohol today. Complains of some epigastric pain similar to previous episodes of pancreatitis.  Labs at baseline. Alcohol intoxication noted. Dilantin level low at 4.4. Replacement ordered. Patient refusing IV so PO will be ordered.  300 mg x2.   Tongue lesion discussed with Dr. Kelly Splinter of plastic surgery. She agrees with outpatient followup and oral rinses.  Epigastric pain with nausea and vomiting.  Still passing gas.  CT with SBO versus ileus.  LFTs stable.  Lipase 83.  Abdomen soft.   LFTs and lipase stable. Lipase 83. TTP epigasttium on exam. Normal BM today. CT with ileus versus SBO.  Patient initially refused IV so dilantin was given PO before SBO indentified. However, doubt complete obstruction as patient is passing gas.  Agreeable to admission for IVF and bowel rest.  D/w Dr. Conley Rolls.  Patient agreeable to admission for hydration and bowel rest.  D/w Dr. Conley Rolls.   I personally performed the services described in this documentation, which was scribed in my  presence. The recorded information has been reviewed and is accurate.    Glynn Octave, MD 09/01/14 2130  Glynn Octave, MD 09/01/14 [phone]

## 2014-08-31 NOTE — ED Notes (Signed)
Pt seen here x 3 days ago for seizures where he bit right side of tongue.  Pt has chunk missing from right side of tongue, c/o pain.  Pt smells of etoh, reports drinking 1 pint of wine PTA.

## 2014-08-31 NOTE — Progress Notes (Signed)
MEDICATION RELATED CONSULT NOTE - INITIAL   Pharmacy Consult for Phenytoin Indication: hx seizures  No Known Allergies  Patient Measurements: Height: 6' (182.9 cm) Weight: 143 lb 4.8 oz (65 kg) IBW/kg (Calculated) : 77.6  Vital Signs: Temp: 97.5 F (36.4 C) (09/02 2116) Temp src: Oral (09/02 2116) BP: 145/114 mmHg (09/02 2116) Pulse Rate: 90 (09/02 2116) Intake/Output from previous day:   Intake/Output from this shift:    Labs:  Recent Labs  08/31/14 1642  WBC 4.6  HGB 12.4*  HCT 34.4*  PLT 130*  CREATININE 0.79  ALBUMIN 2.8*  PROT 6.4  AST 21  ALT 14  ALKPHOS 110  BILITOT 0.2*   Estimated Creatinine Clearance: 103.8 ml/min (by C-G formula based on Cr of 0.79).   Microbiology: No results found for this or any previous visit (from the past 720 hour(s)).  Medical History: Past Medical History  Diagnosis Date  . Pancreatitis, acute     First episode earlier in 2012, hospitalized again in 02/2012 and 09/2012  . Hypertension     noncompliance  . Hyperlipidemia     noncompliance  . Lung collapse     h/o  . ETOH abuse   . Cocaine abuse   . DTs (delirium tremens)     history of  . Tobacco abuse   . Seizure disorder 07/17/2013  . Noncompliance 07/17/2013  . Traumatic subdural hematoma 06/15/2013  . 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 Open skull fracture 06/15/2013  . Polysubstance abuse     etoh, cocaine  . Nephrolithiasis   . Seizures     Medications:  Prescriptions prior to admission  Medication Sig Dispense Refill  . levETIRAcetam (KEPPRA) 1000 MG tablet Take 1 tablet (1,000 mg total) by mouth 2 (two) times daily.  30 tablet  1  . lisinopril (PRINIVIL,ZESTRIL) 20 MG tablet Take 1 tablet (20 mg total) by mouth daily.  30 tablet  0  . phenytoin (DILANTIN) 100 MG ER capsule Take 3 capsules (300 mg total) by mouth at bedtime.  90 capsule  1    Assessment: 48 yo M with hx seizures (last episode ~ 1 month ago), medical non-compliance, &  alcohol abuse was admitted with abdominal pain, nausea, vomiting.   Phenytoin level was low on admission (corrected level ~ 6.7 mcg/ml).  This could be due to patient acute illness and hx of noncompliance.  He was given  oral phenytoin load in ED after refusing IV.   Admitting MD ordered additional  IV phenytoin load & continued home dose of  po qhs.   Renal function is normal & at patient's baseline.  Albumin is even lower today.  Phenytoin level within goal range today & elevated when corrected for albumin (~33).  Level was drawn with morning labs so is not truly a trough level.  Goal of Therapy:  Phenytoin level 10-20 mcg/ml  Plan:  Recommend continue phenytoin  po qhs Consider omit phenytoin dose tonight due to elevated level, patient confusion Consider recheck phenytoin trough level   Elson Clan 08/31/2014,9:24 PM

## 2014-08-31 NOTE — ED Notes (Signed)
Patient waiting without any complaints, will continue to monitor 

## 2014-09-01 DIAGNOSIS — Z91199 Patient's noncompliance with other medical treatment and regimen due to unspecified reason: Secondary | ICD-10-CM

## 2014-09-01 DIAGNOSIS — D61818 Other pancytopenia: Secondary | ICD-10-CM

## 2014-09-01 DIAGNOSIS — Z9119 Patient's noncompliance with other medical treatment and regimen: Secondary | ICD-10-CM

## 2014-09-01 DIAGNOSIS — Z8669 Personal history of other diseases of the nervous system and sense organs: Secondary | ICD-10-CM

## 2014-09-01 DIAGNOSIS — K560 Paralytic ileus: Principal | ICD-10-CM

## 2014-09-01 LAB — COMPREHENSIVE METABOLIC PANEL
ALBUMIN: 2.2 g/dL — AB (ref 3.5–5.2)
ALT: 13 U/L (ref 0–53)
ANION GAP: 9 (ref 5–15)
AST: 24 U/L (ref 0–37)
Alkaline Phosphatase: 90 U/L (ref 39–117)
BUN: 7 mg/dL (ref 6–23)
CALCIUM: 7 mg/dL — AB (ref 8.4–10.5)
CO2: 26 mEq/L (ref 19–32)
CREATININE: 0.63 mg/dL (ref 0.50–1.35)
Chloride: 107 mEq/L (ref 96–112)
GFR calc Af Amer: >90 mL/min (ref >90)
GFR calc non Af Amer: >90 mL/min (ref >90)
Glucose, Bld: 92 mg/dL (ref 70–99)
Potassium: 3.4 mEq/L — ABNORMAL LOW (ref 3.7–5.3)
Sodium: 142 mEq/L (ref 137–147)
Total Bilirubin: 0.2 mg/dL — ABNORMAL LOW (ref 0.3–1.2)
Total Protein: 5.2 g/dL — ABNORMAL LOW (ref 6.0–8.3)

## 2014-09-01 LAB — CBC
HEMATOCRIT: 31 % — AB (ref 39.0–52.0)
Hemoglobin: 11.1 g/dL — ABNORMAL LOW (ref 13.0–17.0)
MCH: 33.7 pg (ref 26.0–34.0)
MCHC: 35.8 g/dL (ref 30.0–36.0)
MCV: 94.2 fL (ref 78.0–100.0)
PLATELETS: 114 10*3/uL — AB (ref 150–400)
RBC: 3.29 MIL/uL — ABNORMAL LOW (ref 4.22–5.81)
RDW: 15.3 % (ref 11.5–15.5)
WBC: 3.6 10*3/uL — AB (ref 4.0–10.5)

## 2014-09-01 LAB — TSH: TSH: 0.846 u[IU]/mL (ref 0.350–4.500)

## 2014-09-01 LAB — PHENYTOIN LEVEL, TOTAL: PHENYTOIN LVL: 18.3 ug/mL (ref 10.0–20.0)

## 2014-09-01 MED ORDER — HYDROCHLOROTHIAZIDE 25 MG PO TABS
25.0000 mg | ORAL_TABLET | Freq: Every day | ORAL | Status: DC
  Administered 2014-09-01: 25 mg via ORAL
  Filled 2014-09-01: qty 1

## 2014-09-01 MED ORDER — HYDRALAZINE HCL 20 MG/ML IJ SOLN
5.0000 mg | Freq: Four times a day (QID) | INTRAMUSCULAR | Status: DC | PRN
  Filled 2014-09-01: qty 1

## 2014-09-01 MED ORDER — NICOTINE 21 MG/24HR TD PT24
21.0000 mg | MEDICATED_PATCH | Freq: Every day | TRANSDERMAL | Status: DC
  Administered 2014-09-01: 21 mg via TRANSDERMAL
  Filled 2014-09-01: qty 1

## 2014-09-01 NOTE — Care Management Note (Signed)
    Page 1 of 1   09/01/2014     3:47:20 PM CARE MANAGEMENT NOTE 09/01/2014  Patient:  Paul [name], Paul [name]   Account Number:  [id number]  Date Initiated:  09/01/2014  Documentation initiated by:  CHILDRESS,JESSICA  Subjective/Objective Assessment:   Pt lives with friend and is independent with ADL's. Pt has no HH services or DME's prior to admission. Pt says he has not recieved any assistance through the hospital for medications, not in chart from previous ED visit that pt was assisted     Action/Plan:   with [facility] voucher. Pt plans to discharge home with self care. CM will continue to follow for CM needs. If assistance is needed will check to see if [facility] voucher was used after last ED visit.   Anticipated DC Date:  09/02/2014   Anticipated DC Plan:  HOME/SELF CARE      DC Planning Services  CM consult  MATCH Program      Choice offered to / List presented to:             Status of service:  In process, will continue to follow Medicare Important Message given?   (If response is "NO", the following Medicare IM given date fields will be blank) Date Medicare IM given:   Medicare IM given by:   Date Additional Medicare IM given:   Additional Medicare IM given by:    Discharge Disposition:  HOME/SELF CARE  Per UR Regulation:    If discussed at Long Length of Stay Meetings, dates discussed:    Comments:  09/01/2014 1500 Kathyrn Sheriff, RN, MSN, PCCN Pt would prefer follow up visit be made with the Health Department.

## 2014-09-01 NOTE — Progress Notes (Signed)
MD made aware of patients elevated BP this afternoon, placed orders for additional meds.  Given as ordered.

## 2014-09-01 NOTE — Progress Notes (Signed)
PROGRESS NOTE  Paul [name]:[id number] DOB: 09-Feb-1966 DOA: 08/31/2014 PCP: No PCP Per Patient [facility]?  Summary: 48 year old man with history of polysubstance abuse ongoing, recurrent alcoholic pancreatitis, recurrent alcohol intoxication who presented with abdominal pain, nausea, vomiting and was admitted for ileus.  Assessment/Plan: 1. Abdominal pain, nausea, vomiting for one week. Clinically ileus suspected, positive bowel sounds, no significant abdominal pain, hungry.  2. Ileus. 3. Alcohol intoxication/abuse or dependence. Appears stable at this time. Monitor for withdrawal. 4 bottles of wine per day. 4. Tongue laceration over one week ago. Appears without infection at this point. 5. Chronic pancreatitis with possible acute component secondary to substance abuse/alcohol. Appears stable at this point. 6. HTN. Stable. 7. Pancytopenia. Suspect alcohol-related. 8. H/o seizure disorder with ongoing medication noncompliance. Continue Dilantin. Level improved. 9. H/o polysubstance abuse (alcohol, cocaine, tobacco) 10. Tobacco dependence 11. Non-compliance    Appears improved today with near resolution of abdominal pain. Plan to advance diet, repeat lab work in the morning.  Monitor for withdrawal. Continue CIWA.  Recommend cessation alcohol, cocaine, tobacco.  Possible d/c in AM  Code Status: full code DVT prophylaxis: SCDs Family Communication: none Disposition Plan: home  Brendia Sacks, MD  [facility]  Pager [phone] If 7PM-7AM, please contact night-coverage at [email or web address], password [facility] 09/01/2014, 9:24 AM  LOS: 1 day   Consultants:    Procedures:   Antibiotics:    HPI/Subjective: Overall feeling better. Minimal abdominal pain. Hungry. Would like to advance diet.  Objective: Filed Vitals:   08/31/14 1738 08/31/14 2116 08/31/14 2132 09/01/14 0628  BP: 153/111 145/114 144/103 133/100  Pulse: 94 90 90   Temp:  97.5 F (36.4  C) 97.5 F (36.4 C) 97.5 F (36.4 C)  TempSrc:  Oral Oral Oral  Resp: Height:  6' (1.829 m) 6' (1.829 m)   Weight:  65 kg (143 lb 4.8 oz) 64.864 kg (143 lb)   SpO2: 100% 100% 100% 100%    Intake/Output Summary (Last 24 hours) at 09/01/14 0924 Last data filed at 08/31/14 2306  Gross per 24 hour  Intake    110 ml  Output      0 ml  Net    110 ml     Filed Weights   08/31/14 1417 08/31/14 2116 08/31/14 2132  Weight: 72.576 kg (160 lb) 65 kg (143 lb 4.8 oz) 64.864 kg (143 lb)    Exam:     Afebrile, vital signs stable. No hypoxia. Gen. Appears calm, comfortable.  Psych. Alert, speech fluent and clear.  ENT. Tongue laceration appears unremarkable without evidence of infection at this point.  Cardiovascular. Regular rate and rhythm. No murmur, rub or gallop.   Respiratory. Clear to auscultation bilaterally. No wheezes, rales or rhonchi. Normal respiratory effort  Abdomen. Positive bowel sounds. Soft, nontender. Nondistended.  Data Reviewed:  Chemistry: Potassium 3.4.  Heme: Mild pancytopenia seen, WBC 3.6, hemoglobin 11.1, platelet count 114.  Scheduled Meds: . amoxicillin-clavulanate  1 tablet Oral Q12H  . heparin  5,000 Units Subcutaneous 3 times per day  . levETIRAcetam  1,000 mg Oral BID  . lisinopril  20 mg Oral Daily  . LORazepam  0-4 mg Oral Q6H   Followed by  . [START ON 09/02/2014] LORazepam  0-4 mg Oral Q12H  . phenytoin  300 mg Oral QHS  . sodium chloride  3 mL Intravenous Q12H   Continuous Infusions:   Principal Problem:   Ileus Active Problems:   Cocaine abuse  Alcohol abuse   Convulsions/seizures   Hypertension   Chronic pancreatitis due to acute alcohol intoxication   Non compliance w medication regimen   Time spent 20 minutes

## 2014-09-01 NOTE — Progress Notes (Signed)
Patient left AMA at 1700 this evening.  Earlier today patient had threatened to leave, but after explaining Dx and plan for the day, he had calmed down and stayed.  Sitter had called me, the RN, into the room a couple of times throughout afternoon stating that patient was wanting to leave, and wanting more to eat other than liquids.  By the time I reached room, patient had fallen back asleep.  I was called into the room again at approx 1645 by sitter.  Upon entering room, patient had already begun getting dressed.  Stated he "wants real food, not the liquids"  Patient appeared agitated.  I asked him to stay and let me speak with the MD concerning his diet and also rechecked his BP which was still elevated despite getting HTCZ this afternoon.  I paged Dr. Irene Limbo, who ordered a soft diet for the patient.  I returned to the room and explained to the patient that he could get a soft diet for this eveing, but he stated he "wanted to go home."  He had proceeded to finish dressing.  AMA paper was signed at 1700, explained to patient that leaving against medical advice left him at risk for death, stroke, continued high BP and ileus.  Patient nodded his head that he understood and signed name.  Patient has been A&O throughout shift.  Patient very unsteady when walking.  Asked patient how he was getting home, angrily responded he was "Walking".  NT assisted patient down to first floor.  I stated that I was worried that he would fall before getting home, but he continued to walk off unit.  Dr. Irene Limbo notified that patient left AMA.

## 2014-09-02 NOTE — Discharge Summary (Addendum)
Physician Discharge Summary  Paul [name] JXB:[id number] DOB: 17-Dec-1966 DOA: 08/31/2014  PCP: No PCP Per Patient  Admit date: 08/31/2014 Discharge date: 09/01/2014  PATIENT LEFT AMA  Discharge Diagnoses:  1. Ileus with abdominal pain, nausea and vomiting 2. Alcohol intoxication, abuse versus dependence 3. Chronic pancreatitis with possible acute component secondary to substance abuse, alcohol 4. Pancytopenia, suspect alcohol related 5. History of seizure disorder with ongoing noncompliance 6. History of polysubstance abuse 7. Noncompliance  Discharge Condition: Left AMA    Filed Weights   08/31/14 1417 08/31/14 2116 08/31/14 2132  Weight: 72.576 kg (160 lb) 65 kg (143 lb 4.8 oz) 64.864 kg (143 lb)    History of present illness:  48 year old man with history of polysubstance abuse ongoing, recurrent alcoholic pancreatitis, recurrent alcohol intoxication who presented with abdominal pain, nausea, vomiting and was admitted for ileus and was intoxicated at time of admission.  Hospital Course:  Paul [name] was treated with supportive care and had rapid resolution of abdominal pain, nausea and vomiting. He insisted upon advancement in his diet. Despite advancement of diet the patient refused to remain hospitalized and left the building. He had clear understanding of his current issues. He has a history of noncompliance and polysubstance abuse.  No Known Allergies  The results of significant diagnostics from this hospitalization (including imaging, microbiology, ancillary and laboratory) are listed below for reference.    Significant Diagnostic Studies: Ct Abdomen Pelvis Wo Contrast  08/31/2014   CLINICAL DATA:  Abdominal pain  EXAM: CT ABDOMEN AND PELVIS WITHOUT CONTRAST  TECHNIQUE: Multidetector CT imaging of the abdomen and pelvis was performed following the standard protocol without IV contrast.  COMPARISON:  05/17/2014  FINDINGS: Lung bases are free of acute infiltrate or sizable  effusion.  The liver, gallbladder, spleen and adrenal glands are within normal limits and stable from the prior exam. Changes of chronic pancreatitis are again seen with multiple cystic areas identified particularly in the body and head of the pancreas. The overall appearance is stable. Evaluation is somewhat limited due to lack of IV contrast. The kidneys are well visualized and within normal limits. No obstructive changes are seen. The appendix is within normal limits. Mild dilatation of proximal small bowel is seen. Air is noted within the colon. These changes may represent a focal ileus. The bladder is well distended. No pelvic mass lesion or sidewall abnormality is noted. 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 Old fractures are noted in the posterior left rib cage.  IMPRESSION: Small bowel dilatation which may represent an ileus or partial small bowel obstruction. Correlation with the physical exam is recommended.  Chronic changes without other acute abnormality.   Electronically Signed   By: Alcide Clever M.D.   On: 08/31/2014 19:30   Ct Head Wo Contrast  08/31/2014   CLINICAL DATA:  Altered mental status, intoxication  EXAM: CT HEAD WITHOUT CONTRAST  TECHNIQUE: Contiguous axial images were obtained from the base of the skull through the vertex without intravenous contrast.  COMPARISON:  08/27/2014  FINDINGS: The bony calvarium is intact. The ventricles are of normal size and configuration. No findings to suggest acute hemorrhage, acute infarction or space-occupying mass lesion are noted.  IMPRESSION: No acute intracranial abnormality noted.   Electronically Signed   By: Alcide Clever M.D.   On: 08/31/2014 19:24   Labs: Basic Metabolic Panel:  Recent Labs Lab 08/27/14 1652 08/31/14 1642 09/01/14 0528  NA 138 142  142  K 3.2* 3.8 3.4*  CL 96 101 107  CO2 GLUCOSE 151* 117* 92  BUN CREATININE 0.75 0.79 0.63    CALCIUM 8.6 8.0* 7.0*   Liver Function Tests:  Recent Labs Lab 08/27/14 1652 08/31/14 1642 09/01/14 0528  AST 44* 21 24  ALT ALKPHOS 146* 110 90  BILITOT 0.6 0.2* 0.2*  PROT 6.5 6.4 5.2*  ALBUMIN 3.0* 2.8* 2.2*    Recent Labs Lab 08/31/14 1642  LIPASE 83*   CBC:  Recent Labs Lab 08/27/14 1652 08/31/14 1642 09/01/14 0528  WBC 5.3 4.6 3.6*  NEUTROABS 4.1 1.6*  --   HGB 13.4 12.4* 11.1*  HCT 36.7* 34.4* 31.0*  MCV 92.7 93.7 94.2  PLT 157 130* 114*   CBG:  Recent Labs Lab 08/27/14 1605  GLUCAP 144*    Principal Problem:   Ileus Active Problems:   Cocaine abuse   Alcohol abuse   Convulsions/seizures   Hypertension   Chronic pancreatitis due to acute alcohol intoxication   Non compliance w medication regimen   Signed:  Brendia Sacks, MD [facility] 09/02/2014, 5:28 PM

## 2014-09-06 ENCOUNTER — Encounter (HOSPITAL_COMMUNITY): Payer: Self-pay | Admitting: Emergency Medicine

## 2014-09-06 ENCOUNTER — Emergency Department (HOSPITAL_COMMUNITY)
Admission: EM | Admit: 2014-09-06 | Discharge: 2014-09-06 | Disposition: A | Discharge status: Home / Self Care | Payer: Self-pay | Attending: Emergency Medicine | Admitting: Emergency Medicine

## 2014-09-06 DIAGNOSIS — Z91199 Patient's noncompliance with other medical treatment and regimen due to unspecified reason: Secondary | ICD-10-CM | POA: Insufficient documentation

## 2014-09-06 DIAGNOSIS — Z9119 Patient's noncompliance with other medical treatment and regimen: Secondary | ICD-10-CM | POA: Insufficient documentation

## 2014-09-06 DIAGNOSIS — Z87820 Personal history of traumatic brain injury: Secondary | ICD-10-CM | POA: Insufficient documentation

## 2014-09-06 DIAGNOSIS — Z8781 Personal history of (healed) traumatic fracture: Secondary | ICD-10-CM | POA: Insufficient documentation

## 2014-09-06 DIAGNOSIS — Z8709 Personal history of other diseases of the respiratory system: Secondary | ICD-10-CM | POA: Insufficient documentation

## 2014-09-06 DIAGNOSIS — Z79899 Other long term (current) drug therapy: Secondary | ICD-10-CM | POA: Insufficient documentation

## 2014-09-06 DIAGNOSIS — R220 Localized swelling, mass and lump, head: Secondary | ICD-10-CM | POA: Insufficient documentation

## 2014-09-06 DIAGNOSIS — G40909 Epilepsy, unspecified, not intractable, without status epilepticus: Secondary | ICD-10-CM | POA: Insufficient documentation

## 2014-09-06 DIAGNOSIS — Z8659 Personal history of other mental and behavioral disorders: Secondary | ICD-10-CM | POA: Insufficient documentation

## 2014-09-06 DIAGNOSIS — R221 Localized swelling, mass and lump, neck: Secondary | ICD-10-CM

## 2014-09-06 DIAGNOSIS — I10 Essential (primary) hypertension: Secondary | ICD-10-CM | POA: Insufficient documentation

## 2014-09-06 DIAGNOSIS — K146 Glossodynia: Secondary | ICD-10-CM | POA: Insufficient documentation

## 2014-09-06 DIAGNOSIS — Z87442 Personal history of urinary calculi: Secondary | ICD-10-CM | POA: Insufficient documentation

## 2014-09-06 DIAGNOSIS — F17200 Nicotine dependence, unspecified, uncomplicated: Secondary | ICD-10-CM | POA: Insufficient documentation

## 2014-09-06 DIAGNOSIS — E785 Hyperlipidemia, unspecified: Secondary | ICD-10-CM | POA: Insufficient documentation

## 2014-09-06 DIAGNOSIS — G8929 Other chronic pain: Secondary | ICD-10-CM | POA: Insufficient documentation

## 2014-09-06 DIAGNOSIS — Z8719 Personal history of other diseases of the digestive system: Secondary | ICD-10-CM | POA: Insufficient documentation

## 2014-09-06 MED ORDER — ACETAMINOPHEN 325 MG PO TABS
650.0000 mg | ORAL_TABLET | Freq: Once | ORAL | Status: AC
  Administered 2014-09-06: 650 mg via ORAL
  Filled 2014-09-06: qty 2

## 2014-09-06 MED ORDER — LISINOPRIL 10 MG PO TABS
20.0000 mg | ORAL_TABLET | Freq: Once | ORAL | Status: AC
  Administered 2014-09-06: 20 mg via ORAL
  Filled 2014-09-06: qty 2

## 2014-09-06 NOTE — ED Notes (Signed)
Patient given discharge instruction, verbalized understand. Patient ambulatory out of the department.  

## 2014-09-06 NOTE — Discharge Instructions (Signed)
Take Tylenol as directed for pain. Your blood pressure tonight was elevated at 180/120. Take your lisinopril and all of your medications as prescribed. Get your blood pressure rechecked within a week at the health department or at an urgent care center

## 2014-09-06 NOTE — ED Notes (Addendum)
Pt c/o tongue pain from seizure two months ago. Pt denies any seizure activity today.

## 2014-09-06 NOTE — ED Provider Notes (Signed)
CSN: [id number]     Arrival date & time 09/06/14  4098 History   First MD Initiated Contact with Patient 09/06/14 [phone]     Chief Complaint  Patient presents with  . Oral Swelling     (Consider location/radiation/quality/duration/timing/severity/associated sxs/prior Treatment) HPI Patient complains of pain at the right side of his tongue for several weeks. Tongue is painful at the site where he bit his tongue as a result of having had seizures. His last seizure was one week ago. He denies not appliance with his medications. Pain is unchanged. Not made better or worse by anything. No other associated symptoms. No other associated symptoms. Brought by EMS no treatment prior to coming Past Medical History  Diagnosis Date  . Pancreatitis, acute     First episode earlier in 2012, hospitalized again in 02/2012 and 09/2012  . Hypertension     noncompliance  . Hyperlipidemia     noncompliance  . Lung collapse     h/o  . ETOH abuse   . Cocaine abuse   . DTs (delirium tremens)     history of  . Tobacco abuse   . Seizure disorder 07/17/2013  . Noncompliance 07/17/2013  . Traumatic subdural hematoma 06/15/2013  . 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 Open skull fracture 06/15/2013  . Polysubstance abuse     etoh, cocaine  . Nephrolithiasis   . Seizures    Past Surgical History  Procedure Laterality Date  . Mandible fracture surgery  2006  . Left axillary      surgery for deep laceration   Family History  Problem Relation Age of Onset  . Diabetes type II Mother   . Liver disease Neg Hx   . Colon cancer Neg Hx   . GI problems Neg Hx    History  Substance Use Topics  . Smoking status: Current Every Day Smoker -- 1.00 packs/day    Types: Cigarettes  . Smokeless tobacco: Not on file  . Alcohol Use: Yes     Comment: 3 bottles of wine per day     last used alcohol yesterday. Last used crack cocaine one week ago Review of Systems  Constitutional: Negative.   HENT:  Negative.        Tongue pain  Respiratory: Negative.   Cardiovascular: Negative.   Gastrointestinal: Negative.   Musculoskeletal: Negative.   Skin: Negative.   Neurological: Negative.   Psychiatric/Behavioral: Negative.   All other systems reviewed and are negative.     Allergies  Review of patient's allergies indicates no known allergies.  Home Medications   Prior to Admission medications   Medication Sig Start Date End Date Taking? Authorizing Provider  levETIRAcetam (KEPPRA) 1000 MG tablet Take 1 tablet (1,000 mg total) by mouth 2 (two) times daily. 08/15/14   Audree Camel, MD  lisinopril (PRINIVIL,ZESTRIL) 20 MG tablet Take 1 tablet (20 mg total) by mouth daily. 08/22/14   Benny Lennert, MD  phenytoin (DILANTIN) 100 MG ER capsule Take 3 capsules (300 mg total) by mouth at bedtime. 08/15/14   Audree Camel, MD   BP 185/136  Pulse 95  Temp(Src) 98.2 F (36.8 C)  Resp 18  Ht 6' (1.829 m)  Wt 160 lb (72.576 kg)  BMI 21.70 kg/m2  SpO2 98% Physical Exam  Nursing note and vitals reviewed. Constitutional: He is oriented to person, place, and time. He appears well-developed and well-nourished. No distress.  HENT:  Head: Normocephalic and atraumatic.  Tongue is scarred at the  right side, not swollen. Has full range of motion. No fluctuance. Tender at right border which is irregular.  Eyes: Conjunctivae are normal. Pupils are equal, round, and reactive to light.  Neck: Neck supple. No tracheal deviation present. No thyromegaly present.  Cardiovascular: Normal rate and regular rhythm.   No murmur heard. Pulmonary/Chest: Effort normal and breath sounds normal.  Abdominal: Soft. Bowel sounds are normal. He exhibits no distension. There is no tenderness.  Musculoskeletal: Normal range of motion. He exhibits no edema and no tenderness.  Neurological: He is alert and oriented to person, place, and time. No cranial nerve deficit. Coordination normal.  Gait normal  Skin: Skin  is warm and dry. No rash noted.  Psychiatric: He has a normal mood and affect.    ED Course  Procedures (including critical care time) Labs Review Labs Reviewed - No data to display  Imaging Review No results found.   EKG Interpretation None       MDM  Tongue pain is chronic. Patient has long-standing history of substance abuse and alcohol abuse. He also has long-standing history of medication noncompliance. He was administered one dose of lisinopril prior discharge. He is not presently asymptomatic from his blood pressure. He was advised to take all his medications as prescribed. Blood pressure recheck one week. Tylenol for pain Diagnosis #1 tongue pain #2 hypertension Final diagnoses:  None        Doug Sou, MD 09/06/14 1610

## 2014-09-08 ENCOUNTER — Emergency Department (HOSPITAL_COMMUNITY)
Admission: EM | Admit: 2014-09-08 | Discharge: 2014-09-09 | Discharge status: Against Medical Advice | Payer: Self-pay | Attending: Emergency Medicine | Admitting: Emergency Medicine

## 2014-09-08 ENCOUNTER — Encounter (HOSPITAL_COMMUNITY): Payer: Self-pay | Admitting: Emergency Medicine

## 2014-09-08 DIAGNOSIS — Z8719 Personal history of other diseases of the digestive system: Secondary | ICD-10-CM | POA: Insufficient documentation

## 2014-09-08 DIAGNOSIS — R1033 Periumbilical pain: Secondary | ICD-10-CM | POA: Insufficient documentation

## 2014-09-08 DIAGNOSIS — Z79899 Other long term (current) drug therapy: Secondary | ICD-10-CM | POA: Insufficient documentation

## 2014-09-08 DIAGNOSIS — R109 Unspecified abdominal pain: Secondary | ICD-10-CM

## 2014-09-08 DIAGNOSIS — Z8781 Personal history of (healed) traumatic fracture: Secondary | ICD-10-CM | POA: Insufficient documentation

## 2014-09-08 DIAGNOSIS — Z87828 Personal history of other (healed) physical injury and trauma: Secondary | ICD-10-CM | POA: Insufficient documentation

## 2014-09-08 DIAGNOSIS — I10 Essential (primary) hypertension: Secondary | ICD-10-CM | POA: Insufficient documentation

## 2014-09-08 DIAGNOSIS — G40909 Epilepsy, unspecified, not intractable, without status epilepticus: Secondary | ICD-10-CM | POA: Insufficient documentation

## 2014-09-08 DIAGNOSIS — F17200 Nicotine dependence, unspecified, uncomplicated: Secondary | ICD-10-CM | POA: Insufficient documentation

## 2014-09-08 LAB — CBC WITH DIFFERENTIAL/PLATELET
BASOS ABS: 0 10*3/uL (ref 0.0–0.1)
Basophils Relative: 0 % (ref 0–1)
EOS PCT: 1 % (ref 0–5)
Eosinophils Absolute: 0 10*3/uL (ref 0.0–0.7)
HEMATOCRIT: 38.3 % — AB (ref 39.0–52.0)
Hemoglobin: 13.5 g/dL (ref 13.0–17.0)
LYMPHS ABS: 1.7 10*3/uL (ref 0.7–4.0)
LYMPHS PCT: 33 % (ref 12–46)
MCH: 33.4 pg (ref 26.0–34.0)
MCHC: 35.2 g/dL (ref 30.0–36.0)
MCV: 94.8 fL (ref 78.0–100.0)
Monocytes Absolute: 0.5 10*3/uL (ref 0.1–1.0)
Monocytes Relative: 10 % (ref 3–12)
NEUTROS ABS: 2.9 10*3/uL (ref 1.7–7.7)
Neutrophils Relative %: 56 % (ref 43–77)
PLATELETS: 218 10*3/uL (ref 150–400)
RBC: 4.04 MIL/uL — ABNORMAL LOW (ref 4.22–5.81)
RDW: 15.4 % (ref 11.5–15.5)
WBC: 5.2 10*3/uL (ref 4.0–10.5)

## 2014-09-08 LAB — LIPASE, BLOOD: LIPASE: 28 U/L (ref 11–59)

## 2014-09-08 LAB — COMPREHENSIVE METABOLIC PANEL
ALK PHOS: 122 U/L — AB (ref 39–117)
ALT: 15 U/L (ref 0–53)
AST: 22 U/L (ref 0–37)
Albumin: 2.7 g/dL — ABNORMAL LOW (ref 3.5–5.2)
Anion gap: 16 — ABNORMAL HIGH (ref 5–15)
BILIRUBIN TOTAL: 0.3 mg/dL (ref 0.3–1.2)
BUN: 4 mg/dL — ABNORMAL LOW (ref 6–23)
CALCIUM: 8.4 mg/dL (ref 8.4–10.5)
CHLORIDE: 98 meq/L (ref 96–112)
CO2: 25 mEq/L (ref 19–32)
Creatinine, Ser: 0.68 mg/dL (ref 0.50–1.35)
GFR calc non Af Amer: >90 mL/min (ref >90)
Glucose, Bld: 129 mg/dL — ABNORMAL HIGH (ref 70–99)
POTASSIUM: 3.2 meq/L — AB (ref 3.7–5.3)
SODIUM: 139 meq/L (ref 137–147)
Total Protein: 6.6 g/dL (ref 6.0–8.3)

## 2014-09-08 LAB — PHENYTOIN LEVEL, TOTAL: Phenytoin Lvl: 5.2 ug/mL — ABNORMAL LOW (ref 10.0–20.0)

## 2014-09-08 MED ORDER — ONDANSETRON HCL 4 MG/2ML IJ SOLN
4.0000 mg | Freq: Once | INTRAMUSCULAR | Status: AC
  Administered 2014-09-08: 4 mg via INTRAVENOUS
  Filled 2014-09-08: qty 2

## 2014-09-08 MED ORDER — SODIUM CHLORIDE 0.9 % IV SOLN
1000.0000 mL | Freq: Once | INTRAVENOUS | Status: AC
  Administered 2014-09-08: 1000 mL via INTRAVENOUS

## 2014-09-08 MED ORDER — SODIUM CHLORIDE 0.9 % IV SOLN
500.0000 mg | Freq: Once | INTRAVENOUS | Status: AC
  Administered 2014-09-09: 500 mg via INTRAVENOUS
  Filled 2014-09-08: qty 10

## 2014-09-08 MED ORDER — SODIUM CHLORIDE 0.9 % IV SOLN
1000.0000 mL | INTRAVENOUS | Status: DC
  Administered 2014-09-09: 1000 mL via INTRAVENOUS

## 2014-09-08 MED ORDER — PHENYTOIN SODIUM 50 MG/ML IJ SOLN
INTRAMUSCULAR | Status: AC
  Filled 2014-09-08: qty 10

## 2014-09-08 MED ORDER — MORPHINE SULFATE 4 MG/ML IJ SOLN
4.0000 mg | Freq: Once | INTRAMUSCULAR | Status: AC
  Administered 2014-09-08: 4 mg via INTRAVENOUS
  Filled 2014-09-08: qty 1

## 2014-09-08 MED ORDER — POTASSIUM CHLORIDE 10 MEQ/100ML IV SOLN
10.0000 meq | Freq: Once | INTRAVENOUS | Status: AC
  Administered 2014-09-08: 10 meq via INTRAVENOUS
  Filled 2014-09-08: qty 100

## 2014-09-08 NOTE — ED Notes (Addendum)
abd pain, hx of sz says sz are more frequent.  N/v,  , epistaxis today.  NO bleeding at present.Says he is out of bp med

## 2014-09-08 NOTE — ED Provider Notes (Signed)
CSN: [id number]     Arrival date & time 09/08/14  2003 History   First MD Initiated Contact with Patient 09/08/14 2310     Chief Complaint  Patient presents with  . Abdominal Pain     (Consider location/radiation/quality/duration/timing/severity/associated sxs/prior Treatment) Patient is a 48 y.o. male presenting with abdominal pain. The history is provided by the patient.  Abdominal Pain He is a very poor and vague historian. He is complaining of periumbilical pain for about the last week. There is associated nausea, vomiting, diarrhea. Pain is sometimes better with vomiting and with bowel movements sometimes not affected. He rates pain a 10/10. He denies fevers or or chills but has had some sweats. Nothing makes the pain better nothing makes it worse. He is also complaining of pain in his tongue which has been present for several weeks. He states that he has a seizure disorder and has been having more frequent seizures but last seizure was about one week ago.  Past Medical History  Diagnosis Date  . Pancreatitis, acute     First episode earlier in 2012, hospitalized again in 02/2012 and 09/2012  . Hypertension     noncompliance  . Hyperlipidemia     noncompliance  . Lung collapse     h/o  . ETOH abuse   . Cocaine abuse   . DTs (delirium tremens)     history of  . Tobacco abuse   . Seizure disorder 07/17/2013  . Noncompliance 07/17/2013  . Traumatic subdural hematoma 06/15/2013  . 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 Open skull fracture 06/15/2013  . Polysubstance abuse     etoh, cocaine  . Nephrolithiasis   . Seizures    Past Surgical History  Procedure Laterality Date  . Mandible fracture surgery  2006  . Left axillary      surgery for deep laceration   Family History  Problem Relation Age of Onset  . Diabetes type II Mother   . Liver disease Neg Hx   . Colon cancer Neg Hx   . GI problems Neg Hx    History  Substance Use Topics  . Smoking status: Current  Every Day Smoker -- 1.00 packs/day    Types: Cigarettes  . Smokeless tobacco: Not on file  . Alcohol Use: Yes     Comment: 3 bottles of wine per day     Review of Systems  Gastrointestinal: Positive for abdominal pain.  All other systems reviewed and are negative.     Allergies  Review of patient's allergies indicates no known allergies.  Home Medications   Prior to Admission medications   Medication Sig Start Date End Date Taking? Authorizing Provider  phenytoin (DILANTIN) 100 MG ER capsule Take 3 capsules (300 mg total) by mouth at bedtime. 08/15/14  Yes Audree Camel, MD  levETIRAcetam (KEPPRA) 1000 MG tablet Take 1 tablet (1,000 mg total) by mouth 2 (two) times daily. 08/15/14   Audree Camel, MD  lisinopril (PRINIVIL,ZESTRIL) 20 MG tablet Take 1 tablet (20 mg total) by mouth daily. 08/22/14   Benny Lennert, MD   BP 165/129  Pulse 116  Temp(Src) 99 F (37.2 C) (Oral)  Resp 20  Ht 6' (1.829 m)  Wt 160 lb (72.576 kg)  BMI 21.70 kg/m2  SpO2 100% Physical Exam  Nursing note and vitals reviewed.  48 year old male, resting comfortably and in no acute distress. Vital signs are significant for hypertension and tachycardia. Oxygen saturation is 100%, which is normal.  Head is normocephalic and atraumatic. PERRLA, EOMI. Oropharynx is clear. There is a well-healed laceration on the right lateral aspect of his tongue with residual deformity from the laceration. Neck is nontender and supple without adenopathy or JVD. Back is nontender and there is no CVA tenderness. Lungs are clear without rales, wheezes, or rhonchi. Chest is nontender. Heart has regular rate and rhythm without murmur. Abdomen is soft, flat, nontender without masses or hepatosplenomegaly and peristalsis is normoactive. Extremities have no cyanosis or edema, full range of motion is present. Skin is warm and dry without rash. Neurologic: Mental status is normal, cranial nerves are intact, there are no motor or  sensory deficits.  ED Course  Procedures (including critical care time) Labs Review Results for orders placed during the hospital encounter of 09/08/14  CBC WITH DIFFERENTIAL      Result Value Ref Range   WBC 5.2  4.0 - 10.5 K/uL   RBC 4.04 (*) 4.22 - 5.81 MIL/uL   Hemoglobin 13.5  13.0 - 17.0 g/dL   HCT 16.1 (*) 09.6 - 04.5 %   MCV 94.8  78.0 - 100.0 fL   MCH 33.4  26.0 - 34.0 pg   MCHC 35.2  30.0 - 36.0 g/dL   RDW 40.9  81.1 - 91.4 %   Platelets 218  150 - 400 K/uL   Neutrophils Relative % 56  43 - 77 %   Neutro Abs 2.9  1.7 - 7.7 K/uL   Lymphocytes Relative 33  12 - 46 %   Lymphs Abs 1.7  0.7 - 4.0 K/uL   Monocytes Relative 10  3 - 12 %   Monocytes Absolute 0.5  0.1 - 1.0 K/uL   Eosinophils Relative 1  0 - 5 %   Eosinophils Absolute 0.0  0.0 - 0.7 K/uL   Basophils Relative 0  0 - 1 %   Basophils Absolute 0.0  0.0 - 0.1 K/uL  COMPREHENSIVE METABOLIC PANEL      Result Value Ref Range   Sodium 139  137 - 147 mEq/L   Potassium 3.2 (*) 3.7 - 5.3 mEq/L   Chloride 98  96 - 112 mEq/L   CO2 25  19 - 32 mEq/L   Glucose, Bld 129 (*) 70 - 99 mg/dL   BUN 4 (*) 6 - 23 mg/dL   Creatinine, Ser 7.82  0.50 - 1.35 mg/dL   Calcium 8.4  8.4 - 95.6 mg/dL   Total Protein 6.6  6.0 - 8.3 g/dL   Albumin 2.7 (*) 3.5 - 5.2 g/dL   AST 22  0 - 37 U/L   ALT 15  0 - 53 U/L   Alkaline Phosphatase 122 (*) 39 - 117 U/L   Total Bilirubin 0.3  0.3 - 1.2 mg/dL   GFR calc non Af Amer >90  >90 mL/min   GFR calc Af Amer >90  >90 mL/min   Anion gap 16 (*) 5 - 15  PHENYTOIN LEVEL, TOTAL      Result Value Ref Range   Phenytoin Lvl 5.2 (*) 10.0 - 20.0 ug/mL  LIPASE, BLOOD      Result Value Ref Range   Lipase 28  11 - 59 U/L   MDM   Final diagnoses:  Abdominal pain, unspecified abdominal location    Abdominal pain, vomiting, diarrhea. Patient is not show any evidence of severe process going on. During my exam, he was far more interested in watching a football game on television than answering my  questions.  WBC is come back normal the potassium is slightly low. He has a subtherapeutic level of phenytoin. He will be given a partial loading dose of phenytoin and some potassium and given IV fluids and reassessed. Old records are reviewed and he has multiple ED visits for abdominal pain and did recently have an ED visit for a seizure and bit tongue.  Patient left AMA before he could be reassessed. It is not clear how much of his potassium and phenytoin he had received.    Dione Booze, MD 09/09/14 [id number]

## 2014-09-09 NOTE — ED Notes (Signed)
Patient told the charge nurse that he was leaving and going home. Would not stop and sign out AMA. Dr. Preston Fleeting notified.

## 2014-09-09 NOTE — ED Notes (Signed)
Patient requests for IV to be removed.

## 2014-09-10 ENCOUNTER — Emergency Department (HOSPITAL_COMMUNITY): Payer: Self-pay

## 2014-09-10 ENCOUNTER — Emergency Department (HOSPITAL_COMMUNITY)
Admission: EM | Admit: 2014-09-10 | Discharge: 2014-09-10 | Discharge status: Against Medical Advice | Payer: Self-pay | Attending: Emergency Medicine | Admitting: Emergency Medicine

## 2014-09-10 ENCOUNTER — Encounter (HOSPITAL_COMMUNITY): Payer: Self-pay | Admitting: Emergency Medicine

## 2014-09-10 DIAGNOSIS — Z8719 Personal history of other diseases of the digestive system: Secondary | ICD-10-CM | POA: Insufficient documentation

## 2014-09-10 DIAGNOSIS — F17200 Nicotine dependence, unspecified, uncomplicated: Secondary | ICD-10-CM | POA: Insufficient documentation

## 2014-09-10 DIAGNOSIS — F1020 Alcohol dependence, uncomplicated: Secondary | ICD-10-CM | POA: Insufficient documentation

## 2014-09-10 DIAGNOSIS — Z8781 Personal history of (healed) traumatic fracture: Secondary | ICD-10-CM | POA: Insufficient documentation

## 2014-09-10 DIAGNOSIS — Z8709 Personal history of other diseases of the respiratory system: Secondary | ICD-10-CM | POA: Insufficient documentation

## 2014-09-10 DIAGNOSIS — G40909 Epilepsy, unspecified, not intractable, without status epilepticus: Secondary | ICD-10-CM | POA: Insufficient documentation

## 2014-09-10 DIAGNOSIS — Z87442 Personal history of urinary calculi: Secondary | ICD-10-CM | POA: Insufficient documentation

## 2014-09-10 DIAGNOSIS — Z7982 Long term (current) use of aspirin: Secondary | ICD-10-CM | POA: Insufficient documentation

## 2014-09-10 DIAGNOSIS — R220 Localized swelling, mass and lump, head: Secondary | ICD-10-CM | POA: Insufficient documentation

## 2014-09-10 DIAGNOSIS — I10 Essential (primary) hypertension: Secondary | ICD-10-CM | POA: Insufficient documentation

## 2014-09-10 DIAGNOSIS — Z8639 Personal history of other endocrine, nutritional and metabolic disease: Secondary | ICD-10-CM | POA: Insufficient documentation

## 2014-09-10 DIAGNOSIS — R221 Localized swelling, mass and lump, neck: Secondary | ICD-10-CM

## 2014-09-10 DIAGNOSIS — Z862 Personal history of diseases of the blood and blood-forming organs and certain disorders involving the immune mechanism: Secondary | ICD-10-CM | POA: Insufficient documentation

## 2014-09-10 DIAGNOSIS — Z79899 Other long term (current) drug therapy: Secondary | ICD-10-CM | POA: Insufficient documentation

## 2014-09-10 LAB — CBC WITH DIFFERENTIAL/PLATELET
BASOS PCT: 1 % (ref 0–1)
Basophils Absolute: 0 10*3/uL (ref 0.0–0.1)
EOS ABS: 0 10*3/uL (ref 0.0–0.7)
Eosinophils Relative: 1 % (ref 0–5)
HEMATOCRIT: 36.8 % — AB (ref 39.0–52.0)
Hemoglobin: 13 g/dL (ref 13.0–17.0)
Lymphocytes Relative: 52 % — ABNORMAL HIGH (ref 12–46)
Lymphs Abs: 2.3 10*3/uL (ref 0.7–4.0)
MCH: 33.9 pg (ref 26.0–34.0)
MCHC: 35.3 g/dL (ref 30.0–36.0)
MCV: 95.8 fL (ref 78.0–100.0)
MONO ABS: 0.3 10*3/uL (ref 0.1–1.0)
MONOS PCT: 7 % (ref 3–12)
Neutro Abs: 1.8 10*3/uL (ref 1.7–7.7)
Neutrophils Relative %: 39 % — ABNORMAL LOW (ref 43–77)
Platelets: 198 10*3/uL (ref 150–400)
RBC: 3.84 MIL/uL — ABNORMAL LOW (ref 4.22–5.81)
RDW: 15.9 % — ABNORMAL HIGH (ref 11.5–15.5)
WBC: 4.4 10*3/uL (ref 4.0–10.5)

## 2014-09-10 LAB — ETHANOL: Alcohol, Ethyl (B): 359 mg/dL — ABNORMAL HIGH (ref 0–11)

## 2014-09-10 LAB — BASIC METABOLIC PANEL
Anion gap: 12 (ref 5–15)
BUN: 4 mg/dL — AB (ref 6–23)
CO2: 25 mEq/L (ref 19–32)
CREATININE: 0.71 mg/dL (ref 0.50–1.35)
Calcium: 7.7 mg/dL — ABNORMAL LOW (ref 8.4–10.5)
Chloride: 103 mEq/L (ref 96–112)
GFR calc Af Amer: >90 mL/min (ref >90)
Glucose, Bld: 102 mg/dL — ABNORMAL HIGH (ref 70–99)
Potassium: 3.5 mEq/L — ABNORMAL LOW (ref 3.7–5.3)
Sodium: 140 mEq/L (ref 137–147)

## 2014-09-10 LAB — CBG MONITORING, ED: Glucose-Capillary: 104 mg/dL — ABNORMAL HIGH (ref 70–99)

## 2014-09-10 MED ORDER — SODIUM CHLORIDE 0.9 % IV BOLUS (SEPSIS)
1000.0000 mL | Freq: Once | INTRAVENOUS | Status: AC
  Administered 2014-09-10: 1000 mL via INTRAVENOUS

## 2014-09-10 NOTE — ED Notes (Signed)
Pt sleeping when I came into room, pt was easily awakened and immidently asked for pain medication

## 2014-09-10 NOTE — ED Notes (Signed)
Pt found walking around hospital with unsteady gait. Pt Escorted to ED by security. Pt reports " my tongue hurts and i feel like i might have another seizure." when pt asked what brought him here to day he reports "my tongue is killing me." nad noted. Pt reports has ingested a pint of etoh prior to arrival today.

## 2014-09-10 NOTE — ED Notes (Signed)
Side rails padded, seizure precautions in place.

## 2014-09-10 NOTE — ED Provider Notes (Signed)
CSN: [id number]     Arrival date & time 09/10/14  1112 History   First MD Initiated Contact with Patient 09/10/14 1135     Chief Complaint  Patient presents with  . Oral Swelling     (Consider location/radiation/quality/duration/timing/severity/associated sxs/prior Treatment) HPI..... nursing notes reviewed. Patient escorted to the emergency department by security after he was found walking in the hospital with an unsteady gait.  Patient has a history of polysubstance abuse and a seizure disorder. He complains of a sore tongue. He reports drinking alcohol today. No fever, chills, stiff neck  Past Medical History  Diagnosis Date  . Pancreatitis, acute     First episode earlier in 2012, hospitalized again in 02/2012 and 09/2012  . Hypertension     noncompliance  . Hyperlipidemia     noncompliance  . Lung collapse     h/o  . ETOH abuse   . Cocaine abuse   . DTs (delirium tremens)     history of  . Tobacco abuse   . Seizure disorder 07/17/2013  . Noncompliance 07/17/2013  . Traumatic subdural hematoma 06/15/2013  . 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 Open skull fracture 06/15/2013  . Polysubstance abuse     etoh, cocaine  . Nephrolithiasis   . Seizures    Past Surgical History  Procedure Laterality Date  . Mandible fracture surgery  2006  . Left axillary      surgery for deep laceration   Family History  Problem Relation Age of Onset  . Diabetes type II Mother   . Liver disease Neg Hx   . Colon cancer Neg Hx   . GI problems Neg Hx    History  Substance Use Topics  . Smoking status: Current Every Day Smoker -- 1.00 packs/day    Types: Cigarettes  . Smokeless tobacco: Not on file  . Alcohol Use: Yes     Comment: 3 bottles of wine per day     Review of Systems  All other systems reviewed and are negative.     Allergies  Review of patient's allergies indicates no known allergies.  Home Medications   Prior to Admission medications   Medication Sig  Start Date End Date Taking? Authorizing Provider  aspirin EC 81 MG tablet Take 162 mg by mouth daily.   Yes Historical Provider, MD  lisinopril (PRINIVIL,ZESTRIL) 20 MG tablet Take 1 tablet (20 mg total) by mouth daily. 08/22/14  Yes Benny Lennert, MD  phenytoin (DILANTIN) 100 MG ER capsule Take 3 capsules (300 mg total) by mouth at bedtime. 08/15/14  Yes Audree Camel, MD   BP 129/109  Pulse 91  Resp 18  Ht  (1.905 m)  Wt 160 lb (72.576 kg)  BMI 20.00 kg/m2  SpO2 98% Physical Exam  Nursing note and vitals reviewed. Constitutional: He is oriented to person, place, and time.  Sleepy but easily arousable. No neurological deficits.  HENT:  Head: Normocephalic and atraumatic.  Tongue appears abraded on the right lateral aspect  Eyes: Conjunctivae and EOM are normal. Pupils are equal, round, and reactive to light.  Neck: Normal range of motion. Neck supple.  Cardiovascular: Normal rate, regular rhythm and normal heart sounds.   Pulmonary/Chest: Effort normal and breath sounds normal.  Abdominal: Soft. Bowel sounds are normal.  Musculoskeletal: Normal range of motion.  Neurological: He is alert and oriented to person, place, and time.  Skin: Skin is warm and dry.  Psychiatric: He has a normal mood and affect.  His behavior is normal.    ED Course  Procedures (including critical care time) Labs Review Labs Reviewed  CBC WITH DIFFERENTIAL - Abnormal; Notable for the following:    RBC 3.84 (*)    HCT 36.8 (*)    RDW 15.9 (*)    Neutrophils Relative % 39 (*)    Lymphocytes Relative 52 (*)    All other components within normal limits  BASIC METABOLIC PANEL - Abnormal; Notable for the following:    Potassium 3.5 (*)    Glucose, Bld 102 (*)    BUN 4 (*)    Calcium 7.7 (*)    All other components within normal limits  ETHANOL - Abnormal; Notable for the following:    Alcohol, Ethyl (B) 359 (*)    All other components within normal limits  CBG MONITORING, ED - Abnormal;  Notable for the following:    Glucose-Capillary 104 (*)    All other components within normal limits  URINALYSIS, ROUTINE W REFLEX MICROSCOPIC  URINE RAPID DRUG SCREEN (HOSP PERFORMED)    Imaging Review Ct Head Wo Contrast  09/10/2014   CLINICAL DATA:  Seizure. Altered behavior. History of seizure disorder.  EXAM: CT HEAD WITHOUT CONTRAST  TECHNIQUE: Contiguous axial images were obtained from the base of the skull through the vertex without intravenous contrast.  COMPARISON:  Head CT 08/31/2014  FINDINGS: No acute intracranial abnormality is identified. Specifically, no intra or extra-axial hemorrhage, mass effect, mass lesion, hydrocephalus, or evidence of acute cortically based infarction. Skull is intact. The visualized paranasal sinuses and mastoid air cells are clear. There is stable medial deviation of the medial orbits bilaterally raising the possibility of prior orbital blowout fractures.  IMPRESSION: No acute intracranial abnormality.   Electronically Signed   By: Britta Mccreedy M.D.   On: 09/10/2014 13:55     EKG Interpretation None      MDM   Final diagnoses:  Alcoholism    Patient decided to leave AMA approximately 1430.    He was alert at this time. Ambulatory without ataxia.  No gross neurological deficits.  I did not see the patient  leave.       Donnetta Hutching, MD 09/10/14 1431

## 2014-09-10 NOTE — ED Notes (Addendum)
Pt cursing. States we are not doing anything for him, we will not give him any pain medication and he is going to leave. Pt informed the doctor is not ready to let him go. Pt states he don't care. Take his IV out he is leaving. Pt informed he needed to follow up with an oral surgeon. Pt alert and oriented. Steady gait when leaving. EDP aware

## 2014-09-14 ENCOUNTER — Emergency Department (HOSPITAL_COMMUNITY)
Admission: EM | Admit: 2014-09-14 | Discharge: 2014-09-14 | Disposition: A | Discharge status: Home / Self Care | Payer: MEDICAID | Attending: Emergency Medicine | Admitting: Emergency Medicine

## 2014-09-14 ENCOUNTER — Encounter (HOSPITAL_COMMUNITY): Payer: Self-pay | Admitting: Emergency Medicine

## 2014-09-14 DIAGNOSIS — K089 Disorder of teeth and supporting structures, unspecified: Secondary | ICD-10-CM | POA: Insufficient documentation

## 2014-09-14 DIAGNOSIS — Z8781 Personal history of (healed) traumatic fracture: Secondary | ICD-10-CM | POA: Insufficient documentation

## 2014-09-14 DIAGNOSIS — Z79899 Other long term (current) drug therapy: Secondary | ICD-10-CM | POA: Insufficient documentation

## 2014-09-14 DIAGNOSIS — Z87448 Personal history of other diseases of urinary system: Secondary | ICD-10-CM | POA: Insufficient documentation

## 2014-09-14 DIAGNOSIS — Z91199 Patient's noncompliance with other medical treatment and regimen due to unspecified reason: Secondary | ICD-10-CM | POA: Insufficient documentation

## 2014-09-14 DIAGNOSIS — I10 Essential (primary) hypertension: Secondary | ICD-10-CM | POA: Insufficient documentation

## 2014-09-14 DIAGNOSIS — F17200 Nicotine dependence, unspecified, uncomplicated: Secondary | ICD-10-CM | POA: Insufficient documentation

## 2014-09-14 DIAGNOSIS — Z7982 Long term (current) use of aspirin: Secondary | ICD-10-CM | POA: Insufficient documentation

## 2014-09-14 DIAGNOSIS — G40909 Epilepsy, unspecified, not intractable, without status epilepticus: Secondary | ICD-10-CM | POA: Insufficient documentation

## 2014-09-14 DIAGNOSIS — Z8639 Personal history of other endocrine, nutritional and metabolic disease: Secondary | ICD-10-CM | POA: Insufficient documentation

## 2014-09-14 DIAGNOSIS — Z8709 Personal history of other diseases of the respiratory system: Secondary | ICD-10-CM | POA: Insufficient documentation

## 2014-09-14 DIAGNOSIS — R569 Unspecified convulsions: Secondary | ICD-10-CM | POA: Insufficient documentation

## 2014-09-14 DIAGNOSIS — K146 Glossodynia: Secondary | ICD-10-CM

## 2014-09-14 DIAGNOSIS — Z862 Personal history of diseases of the blood and blood-forming organs and certain disorders involving the immune mechanism: Secondary | ICD-10-CM | POA: Insufficient documentation

## 2014-09-14 DIAGNOSIS — Z9119 Patient's noncompliance with other medical treatment and regimen: Secondary | ICD-10-CM | POA: Insufficient documentation

## 2014-09-14 MED ORDER — KETOROLAC TROMETHAMINE 60 MG/2ML IM SOLN: 60.0000 mg | Freq: Once | INTRAMUSCULAR | Status: AC

## 2014-09-14 MED ORDER — KETOROLAC TROMETHAMINE 60 MG/2ML IM SOLN
60.0000 mg | Freq: Once | INTRAMUSCULAR | Status: AC
  Administered 2014-09-14: 60 mg via INTRAMUSCULAR
  Filled 2014-09-14: qty 2

## 2014-09-14 NOTE — ED Notes (Signed)
Patient resting in bed at this time. Eyes closed, even rise and fall of chest. No acute distress noted at this time.

## 2014-09-14 NOTE — ED Notes (Signed)
Patient states "im ready to go" EDP notified. Per EDP- ambulate patient. Patient ambulatory around nurses station, steady gait, no deficits noted. No complaints of pain. Per EDP patient can be discharged

## 2014-09-14 NOTE — ED Notes (Signed)
Patient c/o right sided tongue pain. Patient states he bit it last week when he has a seizure. Deformity noted to tongue. No bleeding. Patient also states he is out of his blood pressure medication x2 weeks

## 2014-09-14 NOTE — Discharge Instructions (Signed)
Take tylenol or motrin for pain.  Stop drinking alcohol.  Follow up as needed.  Take your bp medicne

## 2014-09-14 NOTE — ED Notes (Signed)
Patient verbalizes understanding of discharge instructions, pain management, and follow up care with PCP. Patient ambulatory out of department at this time.

## 2014-09-14 NOTE — ED Provider Notes (Signed)
[id number]  a healing laceration  Eyes: Conjunctivae and EOM are normal. No scleral icterus.  Neck: Neck supple. No thyromegaly present.  Cardiovascular: Normal rate and regular rhythm.  Exam reveals no gallop and no friction rub.   No murmur heard. Pulmonary/Chest: No stridor. He has no wheezes. He has no rales. He exhibits no tenderness.  Abdominal: He exhibits no distension. There is no tenderness. There is no rebound.  Musculoskeletal: Normal range of motion. He exhibits no edema.  Lymphadenopathy:    He has no cervical adenopathy.  Neurological: He is oriented to person, place, and time. He exhibits normal muscle tone. Coordination normal.  Pt mildly  lethargic  Skin: No rash noted. No erythema.  Psychiatric: He has a normal mood and affect. His behavior is normal.    ED Course  Procedures (including critical care time) Labs Review Labs Reviewed - No data to display  Imaging Review No results found.   EKG Interpretation None      MDM   Final diagnoses:  Tongue pain    Pt with a tongue laceration from over one week ago when he had a sz    Benny Lennert, MD 09/14/14 2047

## 2014-09-14 NOTE — ED Notes (Signed)
Having pain to right side of tongue.  pts says last seizure was one week ago.  Having increasing pain to right side of tongue.  Rates pain 8.  Took prescription med, pt not sure of name, without relief.

## 2014-09-14 NOTE — ED Notes (Signed)
Pt states feels like he is going to have a seizure, states "it feels like something is coming."  Pt intoxicated, admits to drinking a pint of wine today.

## 2014-09-19 NOTE — Progress Notes (Signed)
UR chart review completed.  

## 2014-10-07 ENCOUNTER — Encounter (HOSPITAL_COMMUNITY): Payer: Self-pay | Admitting: Emergency Medicine

## 2014-10-07 ENCOUNTER — Emergency Department (HOSPITAL_COMMUNITY)
Admission: EM | Admit: 2014-10-07 | Discharge: 2014-10-07 | Disposition: A | Discharge status: Home / Self Care | Payer: MEDICAID | Attending: Emergency Medicine | Admitting: Emergency Medicine

## 2014-10-07 DIAGNOSIS — F10129 Alcohol abuse with intoxication, unspecified: Secondary | ICD-10-CM | POA: Insufficient documentation

## 2014-10-07 DIAGNOSIS — F10920 Alcohol use, unspecified with intoxication, uncomplicated: Secondary | ICD-10-CM

## 2014-10-07 DIAGNOSIS — Z8709 Personal history of other diseases of the respiratory system: Secondary | ICD-10-CM | POA: Insufficient documentation

## 2014-10-07 DIAGNOSIS — Z79899 Other long term (current) drug therapy: Secondary | ICD-10-CM | POA: Insufficient documentation

## 2014-10-07 DIAGNOSIS — Z87820 Personal history of traumatic brain injury: Secondary | ICD-10-CM | POA: Insufficient documentation

## 2014-10-07 DIAGNOSIS — Z8639 Personal history of other endocrine, nutritional and metabolic disease: Secondary | ICD-10-CM | POA: Insufficient documentation

## 2014-10-07 DIAGNOSIS — G40909 Epilepsy, unspecified, not intractable, without status epilepticus: Secondary | ICD-10-CM | POA: Insufficient documentation

## 2014-10-07 DIAGNOSIS — I10 Essential (primary) hypertension: Secondary | ICD-10-CM | POA: Insufficient documentation

## 2014-10-07 DIAGNOSIS — Z87442 Personal history of urinary calculi: Secondary | ICD-10-CM | POA: Insufficient documentation

## 2014-10-07 DIAGNOSIS — Z8781 Personal history of (healed) traumatic fracture: Secondary | ICD-10-CM | POA: Insufficient documentation

## 2014-10-07 NOTE — ED Notes (Signed)
Patient agitated, pulled out IV. Patient stating he was going to leave. MD advised, stating if he has a ride to come get him and is steady he can go. Patient made aware. Offered food/drink, cursing and refusing at present.

## 2014-10-07 NOTE — ED Notes (Signed)
Patient found lying on sidewalk outside [employer].  Waitress states he was in earlier and was "out of it".  Given coffee and sandwich at that time, then he left.  When EMS arrived, patient was awake and talking.  Speech is slurred and patient admits to drinking 2 bottles of wine this morning.

## 2014-10-07 NOTE — ED Provider Notes (Signed)
CSN: [id number]     Arrival date & time 10/07/14  1132 History  This chart was scribed for Joya Gaskinsonald W Karuna Balducci, MD by Leone PayorSonum Patel, ED Scribe. This patient was seen in room APA04/APA04 and the patient's care was started 11:58 AM.    Chief Complaint  Patient presents with  . Alcohol Intoxication    The history is provided by the patient. The history is limited by the condition of the patient. No language interpreter was used.    LEVEL 5 CAVEAT - ETOH Intoxication  HPI Comments: Paul [name] is a 48 y.o. male with past medical history of ETOH abuse, seizures who presents to the Emergency Department complaining of alcohol intoxication today. Patient was found lying on the sidewalk outside of [employer]. He reports constant, unchanged tongue pain which he states is due to a seizure that occurred last week. Per nursing note, he drank 2 bottles of wine this morning. He denies fever, seizure today.   Past Medical History  Diagnosis Date  . Pancreatitis, acute     First episode earlier in 2012, hospitalized again in 02/2012 and 09/2012  . Hypertension     noncompliance  . Hyperlipidemia     noncompliance  . Lung collapse     h/o  . ETOH abuse   . Cocaine abuse   . DTs (delirium tremens)     history of  . Tobacco abuse   . Seizure disorder 07/17/2013  . Noncompliance 07/17/2013  . Traumatic subdural hematoma 06/15/2013  . 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 Open skull fracture 06/15/2013  . Polysubstance abuse     etoh, cocaine  . Nephrolithiasis   . Seizures    Past Surgical History  Procedure Laterality Date  . Mandible fracture surgery  2006  . Left axillary      surgery for deep laceration   Family History  Problem Relation Age of Onset  . Diabetes type II Mother   . Liver disease Neg Hx   . Colon cancer Neg Hx   . GI problems Neg Hx    History  Substance Use Topics  . Smoking status: Current Every Day Smoker -- 1.00 packs/day    Types: Cigarettes  .  Smokeless tobacco: Not on file  . Alcohol Use: Yes     Comment: 3 bottles of wine per day     Review of Systems  Unable to perform ROS: Other      Allergies  Review of patient's allergies indicates no known allergies.  Home Medications   Prior to Admission medications   Medication Sig Start Date End Date Taking? Authorizing Provider  phenytoin (DILANTIN) 100 MG ER capsule Take 3 capsules (300 mg total) by mouth at bedtime. 08/15/14  Yes Audree CamelScott T Goldston, MD   BP 142/102  Pulse 104  Temp(Src) 97.7 F (36.5 C) (Oral)  Resp 12  Ht 6' (1.829 m)  Wt 144 lb (65.318 kg)  BMI 19.53 kg/m2  SpO2 95% Physical Exam  Nursing note and vitals reviewed.  CONSTITUTIONAL: Disheveled, smells of ETOH HEAD: Normocephalic/atraumatic EYES: EOMI/PERRL ENMT: Mucous membranes moist, scarring noted to tongue. No acute lacerations noted.  NECK: supple no meningeal signs SPINE:entire spine nontender CV: S1/S2 noted, no murmurs/rubs/gallops noted LUNGS: Lungs are clear to auscultation bilaterally, no apparent distress ABDOMEN: soft, nontender, no rebound or guarding GU:no cva tenderness NEURO: Pt is awake/alert, moves all extremitiesx4 EXTREMITIES: pulses normal, full ROM SKIN: warm, color normal PSYCH: no abnormalities of mood noted  ED Course  Procedures   DIAGNOSTIC STUDIES: Oxygen Saturation is 95% on RA, adequate by my interpretation.    COORDINATION OF CARE: 12:01 PM Discussed treatment plan with pt at bedside and pt agreed to plan. Pt clearly intoxicated He reports continued tongue pain and has evidence of healing wound from previous sz No signs of acute head trauma    Pt awake/alert He is eating a meal tray without difficulty He still reports tongue pain (this appears chronic, no acute trauma) but no other acute issue at this time He has ride home.   Pt appropriate for d/c home  MDM   Final diagnoses:  Alcohol intoxication, uncomplicated    Nursing notes including  past medical history and social history reviewed and considered in documentation   I personally performed the services described in this documentation, which was scribed in my presence. The recorded information has been reviewed and is accurate.      Joya Gaskinsonald W Alvan Culpepper, MD 10/07/14 [phone]

## 2014-10-07 NOTE — Discharge Instructions (Signed)
Alcohol Intoxication °Alcohol intoxication occurs when you drink enough alcohol that it affects your ability to function. It can be mild or very severe. Drinking a lot of alcohol in a short time is called binge drinking. This can be very harmful. Drinking alcohol can also be more dangerous if you are taking medicines or other drugs. Some of the effects caused by alcohol may include: °· Loss of coordination. °· Changes in mood and behavior. °· Unclear thinking. °· Trouble talking (slurred speech). °· Throwing up (vomiting). °· Confusion. °· Slowed breathing. °· Twitching and shaking (seizures). °· Loss of consciousness. °HOME CARE °· Do not drive after drinking alcohol. °· Drink enough water and fluids to keep your pee (urine) clear or pale yellow. Avoid caffeine. °· Only take medicine as told by your doctor. °GET HELP IF: °· You throw up (vomit) many times. °· You do not feel better after a few days. °· You frequently have alcohol intoxication. Your doctor can help decide if you should see a substance use treatment counselor. °GET HELP RIGHT AWAY IF: °· You become shaky when you stop drinking. °· You have twitching and shaking. °· You throw up blood. It may look bright red or like coffee grounds. °· You notice blood in your poop (bowel movements). °· You become lightheaded or pass out (faint). °MAKE SURE YOU:  °· Understand these instructions. °· Will watch your condition. °· Will get help right away if you are not doing well or get worse. °Document Released: 06/03/2008 Document Revised: 08/18/2013 Document Reviewed: 05/21/2013 °Exit[employer]® Patient Information ©2015 [employer]. This information is not intended to replace advice given to you by your health care provider. Make sure you discuss any questions you have with your health care provider. ° °

## 2014-10-07 NOTE — ED Notes (Signed)
Patient given meal tray.

## 2014-10-07 NOTE — ED Notes (Signed)
Ride here to transport pt home

## 2014-10-10 ENCOUNTER — Emergency Department (HOSPITAL_COMMUNITY)
Admission: EM | Admit: 2014-10-10 | Discharge: 2014-10-10 | Disposition: A | Discharge status: Home / Self Care | Payer: MEDICAID | Attending: Emergency Medicine | Admitting: Emergency Medicine

## 2014-10-10 ENCOUNTER — Encounter (HOSPITAL_COMMUNITY): Payer: Self-pay | Admitting: Emergency Medicine

## 2014-10-10 DIAGNOSIS — S01512A Laceration without foreign body of oral cavity, initial encounter: Secondary | ICD-10-CM | POA: Insufficient documentation

## 2014-10-10 DIAGNOSIS — Z720 Tobacco use: Secondary | ICD-10-CM | POA: Insufficient documentation

## 2014-10-10 DIAGNOSIS — Z79899 Other long term (current) drug therapy: Secondary | ICD-10-CM | POA: Insufficient documentation

## 2014-10-10 DIAGNOSIS — Z9114 Patient's other noncompliance with medication regimen: Secondary | ICD-10-CM | POA: Insufficient documentation

## 2014-10-10 DIAGNOSIS — Y9289 Other specified places as the place of occurrence of the external cause: Secondary | ICD-10-CM | POA: Insufficient documentation

## 2014-10-10 DIAGNOSIS — X58XXXA Exposure to other specified factors, initial encounter: Secondary | ICD-10-CM | POA: Insufficient documentation

## 2014-10-10 DIAGNOSIS — R569 Unspecified convulsions: Secondary | ICD-10-CM

## 2014-10-10 DIAGNOSIS — F1010 Alcohol abuse, uncomplicated: Secondary | ICD-10-CM | POA: Insufficient documentation

## 2014-10-10 DIAGNOSIS — I10 Essential (primary) hypertension: Secondary | ICD-10-CM | POA: Insufficient documentation

## 2014-10-10 DIAGNOSIS — Z8639 Personal history of other endocrine, nutritional and metabolic disease: Secondary | ICD-10-CM | POA: Insufficient documentation

## 2014-10-10 DIAGNOSIS — Y9389 Activity, other specified: Secondary | ICD-10-CM | POA: Insufficient documentation

## 2014-10-10 DIAGNOSIS — G40909 Epilepsy, unspecified, not intractable, without status epilepticus: Secondary | ICD-10-CM | POA: Insufficient documentation

## 2014-10-10 DIAGNOSIS — Z8781 Personal history of (healed) traumatic fracture: Secondary | ICD-10-CM | POA: Insufficient documentation

## 2014-10-10 DIAGNOSIS — Z87442 Personal history of urinary calculi: Secondary | ICD-10-CM | POA: Insufficient documentation

## 2014-10-10 DIAGNOSIS — Z87828 Personal history of other (healed) physical injury and trauma: Secondary | ICD-10-CM | POA: Insufficient documentation

## 2014-10-10 LAB — CBC
HEMATOCRIT: 37 % — AB (ref 39.0–52.0)
Hemoglobin: 13 g/dL (ref 13.0–17.0)
MCH: 34.3 pg — AB (ref 26.0–34.0)
MCHC: 35.1 g/dL (ref 30.0–36.0)
MCV: 97.6 fL (ref 78.0–100.0)
Platelets: 151 10*3/uL (ref 150–400)
RBC: 3.79 MIL/uL — AB (ref 4.22–5.81)
RDW: 16.3 % — ABNORMAL HIGH (ref 11.5–15.5)
WBC: 5.8 10*3/uL (ref 4.0–10.5)

## 2014-10-10 LAB — RAPID URINE DRUG SCREEN, HOSP PERFORMED
Amphetamines: NOT DETECTED
BARBITURATES: NOT DETECTED
Benzodiazepines: NOT DETECTED
Cocaine: NOT DETECTED
Opiates: NOT DETECTED
Tetrahydrocannabinol: NOT DETECTED

## 2014-10-10 LAB — URINE MICROSCOPIC-ADD ON

## 2014-10-10 LAB — URINALYSIS, ROUTINE W REFLEX MICROSCOPIC
BILIRUBIN URINE: NEGATIVE
GLUCOSE, UA: 250 mg/dL — AB
Ketones, ur: NEGATIVE mg/dL
Leukocytes, UA: NEGATIVE
Nitrite: NEGATIVE
PH: 6 (ref 5.0–8.0)
Protein, ur: 30 mg/dL — AB
SPECIFIC GRAVITY, URINE: 1.025 (ref 1.005–1.030)
Urobilinogen, UA: 1 mg/dL (ref 0.0–1.0)

## 2014-10-10 LAB — BLOOD GAS, VENOUS
Acid-base deficit: 6.6 mmol/L — ABNORMAL HIGH (ref 0.0–2.0)
BICARBONATE: 18.2 meq/L — AB (ref 20.0–24.0)
Drawn by: 105551
O2 Saturation: 79.6 %
PH VEN: 7.331 — AB (ref 7.250–7.300)
Patient temperature: 37
TCO2: 16.5 mmol/L (ref 0–100)
pCO2, Ven: 35.4 mmHg — ABNORMAL LOW (ref 45.0–50.0)
pO2, Ven: 51.1 mmHg — ABNORMAL HIGH (ref 30.0–45.0)

## 2014-10-10 LAB — BASIC METABOLIC PANEL
ANION GAP: 18 — AB (ref 5–15)
Anion gap: 24 — ABNORMAL HIGH (ref 5–15)
BUN: 7 mg/dL (ref 6–23)
BUN: 8 mg/dL (ref 6–23)
CHLORIDE: 93 meq/L — AB (ref 96–112)
CO2: 18 mEq/L — ABNORMAL LOW (ref 19–32)
CO2: 22 meq/L (ref 19–32)
CREATININE: 0.76 mg/dL (ref 0.50–1.35)
Calcium: 7.6 mg/dL — ABNORMAL LOW (ref 8.4–10.5)
Calcium: 7.9 mg/dL — ABNORMAL LOW (ref 8.4–10.5)
Chloride: 95 mEq/L — ABNORMAL LOW (ref 96–112)
Creatinine, Ser: 0.93 mg/dL (ref 0.50–1.35)
GFR calc Af Amer: >90 mL/min (ref >90)
GFR calc non Af Amer: >90 mL/min (ref >90)
GLUCOSE: 249 mg/dL — AB (ref 70–99)
Glucose, Bld: 230 mg/dL — ABNORMAL HIGH (ref 70–99)
POTASSIUM: 3.6 meq/L — AB (ref 3.7–5.3)
Potassium: 3.5 mEq/L — ABNORMAL LOW (ref 3.7–5.3)
Sodium: 135 mEq/L — ABNORMAL LOW (ref 137–147)
Sodium: 135 mEq/L — ABNORMAL LOW (ref 137–147)

## 2014-10-10 LAB — CBG MONITORING, ED: Glucose-Capillary: 254 mg/dL — ABNORMAL HIGH (ref 70–99)

## 2014-10-10 LAB — ETHANOL

## 2014-10-10 LAB — PHENYTOIN LEVEL, TOTAL: Phenytoin Lvl: <2.5 ug/mL — ABNORMAL LOW (ref 10.0–20.0)

## 2014-10-10 MED ORDER — SODIUM CHLORIDE 0.9 % IV SOLN
1000.0000 mg | Freq: Once | INTRAVENOUS | Status: AC
  Administered 2014-10-10: 1000 mg via INTRAVENOUS
  Filled 2014-10-10: qty 20

## 2014-10-10 MED ORDER — SODIUM CHLORIDE 0.9 % IV BOLUS (SEPSIS)
1000.0000 mL | Freq: Once | INTRAVENOUS | Status: AC
  Administered 2014-10-10: 1000 mL via INTRAVENOUS

## 2014-10-10 MED ORDER — PHENYTOIN SODIUM 50 MG/ML IJ SOLN
INTRAMUSCULAR | Status: AC
Start: 2014-10-10 — End: 2014-10-10
  Filled 2014-10-10: qty 20

## 2014-10-10 NOTE — Progress Notes (Signed)
MEDICATION RELATED CONSULT NOTE - INITIAL   Pharmacy Consult for IV Phenytoin Indication: Seizure  No Known Allergies  Patient Measurements: Height: 6' (182.9 cm) Weight: 144 lb (65.318 kg) IBW/kg (Calculated) : 77.6  Vital Signs: Temp: 98.8 F (37.1 C) (10/12 0011) Temp Source: Oral (10/12 0011) BP: 165/136 mmHg (10/12 0011) Pulse Rate: 111 (10/12 0011) Intake/Output from previous day:   Intake/Output from this shift:    Labs:  Recent Labs  10/10/14 0013  WBC 5.8  HGB 13.0  HCT 37.0*  PLT 151  CREATININE 0.93   Estimated Creatinine Clearance: 89.7 ml/min (by C-G formula based on Cr of 0.93).   Microbiology: No results found for this or any previous visit (from the past 720 hour(s)).  Medical History: Past Medical History  Diagnosis Date  . Pancreatitis, acute     First episode earlier in 2012, hospitalized again in 02/2012 and 09/2012  . Hypertension     noncompliance  . Hyperlipidemia     noncompliance  . Lung collapse     h/o  . ETOH abuse   . Cocaine abuse   . DTs (delirium tremens)     history of  . Tobacco abuse   . Seizure disorder 07/17/2013  . Noncompliance 07/17/2013  . Traumatic subdural hematoma 06/15/2013  . 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 Open skull fracture 06/15/2013  . Polysubstance abuse     etoh, cocaine  . Nephrolithiasis   . Seizures     Medications:   Assessment: 48 yo male with PMH of seizure disorder, ETOH abuse, and noncompliance with medications. EMS witnessed seizure prior to arrival in the ED. PTA medication lists phenytoin 300 mg PO at bedtime. Phenytoin level <2.5 mcg/ml in the ED. Request to provide IV phenytoin loading dose prior to discharge.   Goal of Therapy:  Therapeutic phenytoin levels 10-20 mg/L  Plan:  Phenytoin 1000 mg IV (15 mg/kg) x 1 loading dose Anticipate discharge and appropriate follow-up by PCP  Arelia SneddonMason, Jaysa Kise Anne 10/10/2014,2:19 AM

## 2014-10-10 NOTE — Discharge Instructions (Signed)
Take your medications as prescribed for seizures. Refrain from alcohol abuse.   Seizure, Adult A seizure is abnormal electrical activity in the brain. Seizures usually last from 30 seconds to 2 minutes. There are various types of seizures. Before a seizure, you may have a warning sensation (aura) that a seizure is about to occur. An aura may include the following symptoms:   Fear or anxiety.  Nausea.  Feeling like the room is spinning (vertigo).  Vision changes, such as seeing flashing lights or spots. Common symptoms during a seizure include:  A change in attention or behavior (altered mental status).  Convulsions with rhythmic jerking movements.  Drooling.  Rapid eye movements.  Grunting.  Loss of bladder and bowel control.  Bitter taste in the mouth.  Tongue biting. After a seizure, you may feel confused and sleepy. You may also have an injury resulting from convulsions during the seizure. HOME CARE INSTRUCTIONS   If you are given medicines, take them exactly as prescribed by your health care provider.  Keep all follow-up appointments as directed by your health care provider.  Do not swim or drive or engage in risky activity during which a seizure could cause further injury to you or others until your health care provider says it is OK.  Get adequate rest.  Teach friends and family what to do if you have a seizure. They should:  Lay you on the ground to prevent a fall.  Put a cushion under your head.  Loosen any tight clothing around your neck.  Turn you on your side. If vomiting occurs, this helps keep your airway clear.  Stay with you until you recover.  Know whether or not you need emergency care. SEEK IMMEDIATE MEDICAL CARE IF:  The seizure lasts longer than 5 minutes.  The seizure is severe or you do not wake up immediately after the seizure.  You have an altered mental status after the seizure.  You are having more frequent or worsening  seizures. Someone should drive you to the emergency department or call local emergency services (911 in [place] MAKE SURE YOU:  Understand these instructions.  Will watch your condition.  Will get help right away if you are not doing well or get worse. Document Released: 12/13/2000 Document Revised: 10/06/2013 Document Reviewed: 07/28/2013 [facility] Patient Information 2015 [place] [place]. This information is not intended to replace advice given to you by your health care provider. Make sure you discuss any questions you have with your health care provider.

## 2014-10-10 NOTE — ED Provider Notes (Signed)
CSN: [id number]     Arrival date & time 10/10/14  0002 History  This chart was scribed for Derwood KaplanAnkit Amori Colomb, MD by Tonye RoyaltyJoshua Chen, ED Scribe. This patient was seen in room APA02/APA02 and the patient's care was started at 12:32 AM.    Chief Complaint  Patient presents with  . Seizures    witnessed seizure per ems. bleeding from mouth noted. b leeding  possible from tongue   The history is provided by the patient. No language interpreter was used.   HPI Comments: Paul [name] is a 48 y.o. male with history of seizures who presents to the Emergency Department complaining of seizure earlier today. He states he takes Phenytoin for seizures and is compliant with it. He states he was feeling fine before his seizure. He reports heavy alcohol use; he states he does not drink every day. He reports history of alcohol abuse and withdrawal. He also reports using crack cocaine but denies using it today. He states he lives with a friend.  Past Medical History  Diagnosis Date  . Pancreatitis, acute     First episode earlier in 2012, hospitalized again in 02/2012 and 09/2012  . Hypertension     noncompliance  . Hyperlipidemia     noncompliance  . Lung collapse     h/o  . ETOH abuse   . Cocaine abuse   . DTs (delirium tremens)     history of  . Tobacco abuse   . Seizure disorder 07/17/2013  . Noncompliance 07/17/2013  . Traumatic subdural hematoma 06/15/2013  . 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 Open skull fracture 06/15/2013  . Polysubstance abuse     etoh, cocaine  . Nephrolithiasis   . Seizures    Past Surgical History  Procedure Laterality Date  . Mandible fracture surgery  2006  . Left axillary      surgery for deep laceration   Family History  Problem Relation Age of Onset  . Diabetes type II Mother   . Liver disease Neg Hx   . Colon cancer Neg Hx   . GI problems Neg Hx    History  Substance Use Topics  . Smoking status: Current Every Day Smoker -- 1.00 packs/day     Types: Cigarettes  . Smokeless tobacco: Not on file  . Alcohol Use: Yes     Comment: 3 bottles of wine per day     Review of Systems  Skin: Positive for wound (to tongue).  Neurological: Positive for seizures.  All other systems reviewed and are negative.     Allergies  Review of patient's allergies indicates no known allergies.  Home Medications   Prior to Admission medications   Medication Sig Start Date End Date Taking? Authorizing Provider  phenytoin (DILANTIN) 100 MG ER capsule Take 3 capsules (300 mg total) by mouth at bedtime. 08/15/14  Yes Audree CamelScott T Goldston, MD   BP 194/136  Pulse 110  Temp(Src) 98.8 F (37.1 C) (Oral)  Resp 13  Ht 6' (1.829 m)  Wt 144 lb (65.318 kg)  BMI 19.53 kg/m2  SpO2 97% Physical Exam  Nursing note and vitals reviewed. Constitutional: He is oriented to person, place, and time. He appears well-developed and well-nourished.  HENT:  Head: Normocephalic and atraumatic.  Tongue laceration on right lateral side  Eyes: Conjunctivae are normal.  Neck: Normal range of motion. Neck supple.  Cardiovascular: Normal rate and regular rhythm.   Pulmonary/Chest: Effort normal and breath sounds normal. No respiratory distress. He has  no wheezes. He has no rales.  Musculoskeletal: Normal range of motion.  Neurological: He is alert and oriented to person, place, and time.  Skin: Skin is warm and dry.  Psychiatric: He has a normal mood and affect.    ED Course  Procedures (including critical care time) Labs Review Labs Reviewed  CBC - Abnormal; Notable for the following:    RBC 3.79 (*)    HCT 37.0 (*)    MCH 34.3 (*)    RDW 16.3 (*)    All other components within normal limits  PHENYTOIN LEVEL, TOTAL - Abnormal; Notable for the following:    Phenytoin Lvl <2.5 (*)    All other components within normal limits  BASIC METABOLIC PANEL - Abnormal; Notable for the following:    Sodium 135 (*)    Potassium 3.6 (*)    Chloride 93 (*)    CO2 18 (*)     Glucose, Bld 249 (*)    Calcium 7.9 (*)    Anion gap 24 (*)    All other components within normal limits  URINALYSIS, ROUTINE W REFLEX MICROSCOPIC - Abnormal; Notable for the following:    Glucose, UA 250 (*)    Hgb urine dipstick TRACE (*)    Protein, ur 30 (*)    All other components within normal limits  BLOOD GAS, VENOUS - Abnormal; Notable for the following:    pH, Ven 7.331 (*)    pCO2, Ven 35.4 (*)    pO2, Ven 51.1 (*)    Bicarbonate 18.2 (*)    Acid-base deficit 6.6 (*)    All other components within normal limits  URINE MICROSCOPIC-ADD ON - Abnormal; Notable for the following:    Bacteria, UA FEW (*)    All other components within normal limits  BASIC METABOLIC PANEL - Abnormal; Notable for the following:    Sodium 135 (*)    Potassium 3.5 (*)    Chloride 95 (*)    Glucose, Bld 230 (*)    Calcium 7.6 (*)    Anion gap 18 (*)    All other components within normal limits  CBG MONITORING, ED - Abnormal; Notable for the following:    Glucose-Capillary 254 (*)    All other components within normal limits  ETHANOL  URINE RAPID DRUG SCREEN (HOSP PERFORMED)    Imaging Review No results found.   EKG Interpretation   Date/Time:  Monday October 10 2014 00:07:34 EDT Ventricular Rate:  116 PR Interval:  136 QRS Duration: 91 QT Interval:  365 QTC Calculation: 507 R Axis:   63 Text Interpretation:  Sinus tachycardia Prolonged QT interval Baseline  wander in lead(s) V3 No acute changes Confirmed by Rhunette Croft, MD, Nathanel Tallman  [phone]) on 10/10/2014 1:21:09 AM     DIAGNOSTIC STUDIES: Oxygen Saturation is 95% on room air, adequate by my interpretation.    COORDINATION OF CARE: 12:46 AM Discussed treatment plan with patient at beside, the patient agrees with the plan and has no further questions at this time.    MDM   Final diagnoses:  Seizure  Alcohol abuse  Non compliance w medication regimen    I personally performed the services described in this documentation,  which was scribed in my presence. The recorded information has been reviewed and is accurate.   Pt comes in post seizure. Hx of alcohol abuse and admits to active drinking, with no plans of quitting. Has a lip laceration, which overtime ceased bleeding. Lac not repaired.   Pt  sub therapeutic on dilantin - and pt given the iv load.  Observed in the ER for ane extended period of time, and eventually discharged.   Derwood KaplanAnkit Newell Frater, MD 10/11/14 [phone]

## 2014-10-10 NOTE — ED Notes (Signed)
No active seizure noted upon arrival to er. Patient is able to retain po fluids and ask for  pain meds.

## 2014-10-29 ENCOUNTER — Emergency Department (HOSPITAL_COMMUNITY): Payer: Self-pay

## 2014-10-29 ENCOUNTER — Encounter (HOSPITAL_COMMUNITY): Payer: Self-pay | Admitting: Emergency Medicine

## 2014-10-29 ENCOUNTER — Emergency Department (HOSPITAL_COMMUNITY)
Admission: EM | Admit: 2014-10-29 | Discharge: 2014-10-29 | Discharge status: Against Medical Advice | Payer: Self-pay | Attending: Emergency Medicine | Admitting: Emergency Medicine

## 2014-10-29 DIAGNOSIS — Y9389 Activity, other specified: Secondary | ICD-10-CM | POA: Insufficient documentation

## 2014-10-29 DIAGNOSIS — I10 Essential (primary) hypertension: Secondary | ICD-10-CM | POA: Insufficient documentation

## 2014-10-29 DIAGNOSIS — Z720 Tobacco use: Secondary | ICD-10-CM | POA: Insufficient documentation

## 2014-10-29 DIAGNOSIS — Z87828 Personal history of other (healed) physical injury and trauma: Secondary | ICD-10-CM | POA: Insufficient documentation

## 2014-10-29 DIAGNOSIS — F1010 Alcohol abuse, uncomplicated: Secondary | ICD-10-CM

## 2014-10-29 DIAGNOSIS — Y929 Unspecified place or not applicable: Secondary | ICD-10-CM | POA: Insufficient documentation

## 2014-10-29 DIAGNOSIS — Z8781 Personal history of (healed) traumatic fracture: Secondary | ICD-10-CM | POA: Insufficient documentation

## 2014-10-29 DIAGNOSIS — Z87448 Personal history of other diseases of urinary system: Secondary | ICD-10-CM | POA: Insufficient documentation

## 2014-10-29 DIAGNOSIS — R569 Unspecified convulsions: Secondary | ICD-10-CM

## 2014-10-29 DIAGNOSIS — W19XXXA Unspecified fall, initial encounter: Secondary | ICD-10-CM

## 2014-10-29 DIAGNOSIS — Z8639 Personal history of other endocrine, nutritional and metabolic disease: Secondary | ICD-10-CM | POA: Insufficient documentation

## 2014-10-29 DIAGNOSIS — Z8719 Personal history of other diseases of the digestive system: Secondary | ICD-10-CM | POA: Insufficient documentation

## 2014-10-29 DIAGNOSIS — W1830XA Fall on same level, unspecified, initial encounter: Secondary | ICD-10-CM | POA: Insufficient documentation

## 2014-10-29 DIAGNOSIS — G40909 Epilepsy, unspecified, not intractable, without status epilepticus: Secondary | ICD-10-CM | POA: Insufficient documentation

## 2014-10-29 DIAGNOSIS — Z8709 Personal history of other diseases of the respiratory system: Secondary | ICD-10-CM | POA: Insufficient documentation

## 2014-10-29 DIAGNOSIS — Z8659 Personal history of other mental and behavioral disorders: Secondary | ICD-10-CM | POA: Insufficient documentation

## 2014-10-29 LAB — CBC WITH DIFFERENTIAL/PLATELET
BASOS ABS: 0 10*3/uL (ref 0.0–0.1)
BASOS PCT: 0 % (ref 0–1)
Eosinophils Absolute: 0 10*3/uL (ref 0.0–0.7)
Eosinophils Relative: 1 % (ref 0–5)
HEMATOCRIT: 34.5 % — AB (ref 39.0–52.0)
Hemoglobin: 11.8 g/dL — ABNORMAL LOW (ref 13.0–17.0)
Lymphocytes Relative: 66 % — ABNORMAL HIGH (ref 12–46)
Lymphs Abs: 2.3 10*3/uL (ref 0.7–4.0)
MCH: 34.1 pg — ABNORMAL HIGH (ref 26.0–34.0)
MCHC: 34.2 g/dL (ref 30.0–36.0)
MCV: 99.7 fL (ref 78.0–100.0)
MONO ABS: 0.2 10*3/uL (ref 0.1–1.0)
Monocytes Relative: 5 % (ref 3–12)
NEUTROS ABS: 1 10*3/uL — AB (ref 1.7–7.7)
Neutrophils Relative %: 28 % — ABNORMAL LOW (ref 43–77)
PLATELETS: 230 10*3/uL (ref 150–400)
RBC: 3.46 MIL/uL — ABNORMAL LOW (ref 4.22–5.81)
RDW: 15.3 % (ref 11.5–15.5)
WBC: 3.6 10*3/uL — AB (ref 4.0–10.5)

## 2014-10-29 LAB — PHENYTOIN LEVEL, TOTAL

## 2014-10-29 LAB — BASIC METABOLIC PANEL
ANION GAP: 13 (ref 5–15)
BUN: 8 mg/dL (ref 6–23)
CHLORIDE: 110 meq/L (ref 96–112)
CO2: 24 mEq/L (ref 19–32)
Calcium: 7.5 mg/dL — ABNORMAL LOW (ref 8.4–10.5)
Creatinine, Ser: 0.73 mg/dL (ref 0.50–1.35)
GFR calc non Af Amer: >90 mL/min (ref >90)
Glucose, Bld: 125 mg/dL — ABNORMAL HIGH (ref 70–99)
Potassium: 3.5 mEq/L — ABNORMAL LOW (ref 3.7–5.3)
SODIUM: 147 meq/L (ref 137–147)

## 2014-10-29 LAB — ETHANOL: Alcohol, Ethyl (B): 417 mg/dL (ref 0–11)

## 2014-10-29 MED ORDER — SODIUM CHLORIDE 0.9 % IV SOLN: 1000.0000 mg | Freq: Once | INTRAVENOUS | Status: DC

## 2014-10-29 MED ORDER — SODIUM CHLORIDE 0.9 % IV BOLUS (SEPSIS)
1000.0000 mL | Freq: Once | INTRAVENOUS | Status: AC
  Administered 2014-10-29: 1000 mL via INTRAVENOUS

## 2014-10-29 MED ORDER — SODIUM CHLORIDE 0.9 % IV SOLN
1000.0000 mg | Freq: Once | INTRAVENOUS | Status: DC
  Filled 2014-10-29: qty 20

## 2014-10-29 NOTE — ED Notes (Signed)
Pt yelling out, intoxicated and wants to go home. Pt gave number of his ride Konrad DoloresLester and Nurse informed EDP.

## 2014-10-29 NOTE — ED Notes (Signed)
Pt states he has had a seizure in the last hour, has also drank "a pint of red" and smell of ETOH. Placed seizure precaution pads on bed for protection.

## 2014-10-29 NOTE — ED Notes (Signed)
CRITICAL VALUE ALERT  Critical value received:  etoh 417  Date of notification:    Time of notification:  2050  Critical value read back:Yes.    Nurse who received alert:  bkn  MD notified (1st page):  [place]  Time of first page:  2052  MD notified (2nd page):  Time of second page:  Responding MD:    Time MD responded:

## 2014-10-29 NOTE — ED Notes (Signed)
Called pt friend Konrad DoloresLester, he will be here is 3 minutes to pick pt up, pt leaving AMA.

## 2014-10-29 NOTE — ED Notes (Signed)
Pt left AMA with friend Konrad DoloresLester who was called at pt request. Konrad DoloresLester stated he ws driving and would see that pt got home safely. Pt understood that he was leaving against medical advise and did not want to stay. Pt was able to answer questions of his name birthday and location prior to leaving. Security followed pt and friend to car to assure that pt was not driving.

## 2014-10-29 NOTE — ED Provider Notes (Signed)
CSN: [id number]     Arrival date & time 10/29/14  1940 History   First MD Initiated Contact with Patient 10/29/14 1952     This chart was scribed for Paul LennertJoseph [name] [name], MD by Arlan OrganAshley Leger, ED Scribe. This patient was seen in room APA06/APA06 and the patient's care was started 7:55 PM.   Chief Complaint  Patient presents with  . Seizures   Patient is a 48 y.o. male presenting with seizures. The history is provided by the patient. No language interpreter was used.  Seizures Seizure activity on arrival: no   Episode characteristics: disorientation and tongue biting   Postictal symptoms: no confusion   Severity:  Moderate Timing:  Once Progression:  Resolved Context: medical non-compliance   Recent head injury:  No recent head injuries History of seizures: yes     HPI Comments: Talbert Paul [name] brought in by a friend is a 48 y.o. male with a PMHx of HTN, hyperlipidemia, ETOH abuse, cocaine abuse, and seizures who presents to the Emergency Department complaining of a seizure sustained "a few minutes ago". Pt admits to consuming approximately 1 pint of wine prior to his seizure. He now c/o constant pain to his tongue secondary to biting down during episode of seizure. Mr. Paul [name] admits to non-compliance with medications as he states he does not have any more pills. No recent fever or chills. No known allergies to medications. No other concerns this visit.  Past Medical History  Diagnosis Date  . Pancreatitis, acute     First episode earlier in 2012, hospitalized again in 02/2012 and 09/2012  . Hypertension     noncompliance  . Hyperlipidemia     noncompliance  . Lung collapse     h/o  . ETOH abuse   . Cocaine abuse   . DTs (delirium tremens)     history of  . Tobacco abuse   . Seizure disorder 07/17/2013  . Noncompliance 07/17/2013  . Traumatic subdural hematoma 06/15/2013  . 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 Open skull fracture 06/15/2013  . Polysubstance abuse      etoh, cocaine  . Nephrolithiasis   . Seizures    Past Surgical History  Procedure Laterality Date  . Mandible fracture surgery  2006  . Left axillary      surgery for deep laceration   Family History  Problem Relation Age of Onset  . Diabetes type II Mother   . Liver disease Neg Hx   . Colon cancer Neg Hx   . GI problems Neg Hx    History  Substance Use Topics  . Smoking status: Current Every Day Smoker -- 1.00 packs/day    Types: Cigarettes  . Smokeless tobacco: Not on file  . Alcohol Use: Yes     Comment: 3 bottles of wine per day     Review of Systems  Constitutional: Negative for appetite change and fatigue.  HENT: Negative for congestion, ear discharge and sinus pressure.   Eyes: Negative for discharge.  Respiratory: Negative for cough.   Cardiovascular: Negative for chest pain.  Gastrointestinal: Negative for abdominal pain and diarrhea.  Genitourinary: Negative for frequency and hematuria.  Musculoskeletal: Negative for back pain.  Skin: Positive for wound. Negative for rash.  Neurological: Positive for seizures. Negative for headaches.  Psychiatric/Behavioral: Negative for hallucinations.      Allergies  Review of patient's allergies indicates no known allergies.  Home Medications   Prior to Admission medications   Medication Sig Start Date End Date  Taking? Authorizing Provider  phenytoin (DILANTIN) 100 MG ER capsule Take 3 capsules (300 mg total) by mouth at bedtime. 08/15/14   Audree CamelScott T Goldston, MD   Triage Vitals: BP 152/118  Pulse 107  Temp(Src) 98.1 F (36.7 C) (Oral)  Resp 18  Ht 6' (1.829 m)  Wt 144 lb (65.318 kg)  BMI 19.53 kg/m2  SpO2 100%   Physical Exam  Constitutional: He is oriented to person, place, and time. He appears well-developed.  Smells of alcohol Mildly lethargic  HENT:  Head: Normocephalic.  Eyes: Conjunctivae and EOM are normal. No scleral icterus.  Neck: Neck supple. No thyromegaly present.  Cardiovascular: Normal  rate and regular rhythm.  Exam reveals no gallop and no friction rub.   No murmur heard. Pulmonary/Chest: No stridor. He has no wheezes. He has no rales. He exhibits no tenderness.  Abdominal: He exhibits no distension. There is no tenderness. There is no rebound.  Musculoskeletal: Normal range of motion. He exhibits no edema.  Lymphadenopathy:    He has no cervical adenopathy.  Neurological: He is oriented to person, place, and time. He exhibits normal muscle tone. Coordination normal.  Skin: No rash noted. No erythema.  Psychiatric: He has a normal mood and affect. His behavior is normal.    ED Course  Procedures (including critical care time)  DIAGNOSTIC STUDIES: Oxygen Saturation is 100% on RA, Normal by my interpretation.    COORDINATION OF CARE: 8:03 PM- Will order CT head without contrast and CT cervical spine without contrast. Will order CBC, BMP, phenytoin level, and ethanol. Discussed treatment plan with pt at bedside and pt agreed to plan.     Labs Review Labs Reviewed - No data to display  Imaging Review No results found.   EKG Interpretation None      MDM   Final diagnoses:  None  pt left ama  I personally performed the services described in this documentation, which was scribed in my presence. The recorded information has been reviewed and is accurate.    Paul LennertJoseph [name] Bryssa Tones, MD 10/29/14 [phone]

## 2014-10-29 NOTE — ED Notes (Addendum)
Patient reports "seizure a few minutes ago".  Reports tongue pain from biting it.  Speech slurred, difficult to obtain answers to questions. Has had a pint wine.

## 2014-11-08 ENCOUNTER — Encounter (HOSPITAL_COMMUNITY): Payer: Self-pay | Admitting: Emergency Medicine

## 2014-11-08 ENCOUNTER — Emergency Department (HOSPITAL_COMMUNITY)
Admission: EM | Admit: 2014-11-08 | Discharge: 2014-11-08 | Discharge status: Against Medical Advice | Payer: MEDICAID | Attending: Emergency Medicine | Admitting: Emergency Medicine

## 2014-11-08 DIAGNOSIS — Z8781 Personal history of (healed) traumatic fracture: Secondary | ICD-10-CM | POA: Insufficient documentation

## 2014-11-08 DIAGNOSIS — Z8639 Personal history of other endocrine, nutritional and metabolic disease: Secondary | ICD-10-CM | POA: Insufficient documentation

## 2014-11-08 DIAGNOSIS — Z87828 Personal history of other (healed) physical injury and trauma: Secondary | ICD-10-CM | POA: Insufficient documentation

## 2014-11-08 DIAGNOSIS — R55 Syncope and collapse: Secondary | ICD-10-CM | POA: Insufficient documentation

## 2014-11-08 DIAGNOSIS — I10 Essential (primary) hypertension: Secondary | ICD-10-CM | POA: Insufficient documentation

## 2014-11-08 DIAGNOSIS — Z8719 Personal history of other diseases of the digestive system: Secondary | ICD-10-CM | POA: Insufficient documentation

## 2014-11-08 DIAGNOSIS — Z8709 Personal history of other diseases of the respiratory system: Secondary | ICD-10-CM | POA: Insufficient documentation

## 2014-11-08 DIAGNOSIS — Z720 Tobacco use: Secondary | ICD-10-CM | POA: Insufficient documentation

## 2014-11-08 DIAGNOSIS — F1010 Alcohol abuse, uncomplicated: Secondary | ICD-10-CM | POA: Insufficient documentation

## 2014-11-08 DIAGNOSIS — G40909 Epilepsy, unspecified, not intractable, without status epilepticus: Secondary | ICD-10-CM | POA: Insufficient documentation

## 2014-11-08 DIAGNOSIS — R000 Tachycardia, unspecified: Secondary | ICD-10-CM | POA: Insufficient documentation

## 2014-11-08 DIAGNOSIS — Z87442 Personal history of urinary calculi: Secondary | ICD-10-CM | POA: Insufficient documentation

## 2014-11-08 LAB — CBC WITH DIFFERENTIAL/PLATELET
BASOS ABS: 0 10*3/uL (ref 0.0–0.1)
Basophils Relative: 0 % (ref 0–1)
EOS PCT: 0 % (ref 0–5)
Eosinophils Absolute: 0 10*3/uL (ref 0.0–0.7)
HCT: 40.2 % (ref 39.0–52.0)
Hemoglobin: 14.1 g/dL (ref 13.0–17.0)
LYMPHS PCT: 57 % — AB (ref 12–46)
Lymphs Abs: 2.6 10*3/uL (ref 0.7–4.0)
MCH: 34.1 pg — ABNORMAL HIGH (ref 26.0–34.0)
MCHC: 35.1 g/dL (ref 30.0–36.0)
MCV: 97.3 fL (ref 78.0–100.0)
Monocytes Absolute: 0.1 10*3/uL (ref 0.1–1.0)
Monocytes Relative: 3 % (ref 3–12)
NEUTROS PCT: 39 % — AB (ref 43–77)
Neutro Abs: 1.8 10*3/uL (ref 1.7–7.7)
PLATELETS: 114 10*3/uL — AB (ref 150–400)
RBC: 4.13 MIL/uL — ABNORMAL LOW (ref 4.22–5.81)
RDW: 14.2 % (ref 11.5–15.5)
WBC: 4.5 10*3/uL (ref 4.0–10.5)

## 2014-11-08 LAB — COMPREHENSIVE METABOLIC PANEL
ALT: 83 U/L — ABNORMAL HIGH (ref 0–53)
ANION GAP: 14 (ref 5–15)
AST: 225 U/L — ABNORMAL HIGH (ref 0–37)
Albumin: 2.5 g/dL — ABNORMAL LOW (ref 3.5–5.2)
Alkaline Phosphatase: 156 U/L — ABNORMAL HIGH (ref 39–117)
BUN: 14 mg/dL (ref 6–23)
CO2: 26 meq/L (ref 19–32)
CREATININE: 1.01 mg/dL (ref 0.50–1.35)
Calcium: 7.6 mg/dL — ABNORMAL LOW (ref 8.4–10.5)
Chloride: 98 mEq/L (ref 96–112)
GFR calc Af Amer: >90 mL/min (ref >90)
GFR, EST NON AFRICAN AMERICAN: 86 mL/min — AB (ref >90)
Glucose, Bld: 274 mg/dL — ABNORMAL HIGH (ref 70–99)
Potassium: 4.3 mEq/L (ref 3.7–5.3)
Sodium: 138 mEq/L (ref 137–147)
TOTAL PROTEIN: 6.2 g/dL (ref 6.0–8.3)
Total Bilirubin: 0.9 mg/dL (ref 0.3–1.2)

## 2014-11-08 LAB — ETHANOL: ALCOHOL ETHYL (B): 301 mg/dL — AB (ref 0–11)

## 2014-11-08 LAB — PHENYTOIN LEVEL, TOTAL: Phenytoin Lvl: <2.5 ug/mL — ABNORMAL LOW (ref 10.0–20.0)

## 2014-11-08 MED ORDER — ACETAMINOPHEN 325 MG PO TABS
650.0000 mg | ORAL_TABLET | Freq: Once | ORAL | Status: DC
  Filled 2014-11-08: qty 2

## 2014-11-08 MED ORDER — SODIUM CHLORIDE 0.9 % IV BOLUS (SEPSIS)
1000.0000 mL | Freq: Once | INTRAVENOUS | Status: AC
  Administered 2014-11-08: 1000 mL via INTRAVENOUS

## 2014-11-08 NOTE — ED Notes (Signed)
In room to give tylenol.  Pt cursing and pulling IV out.  Getting up off end of bed.  Told pt to get back in bed.  Pt refuses.  States he is going to leave.  Security called.  Can hear pt in room cursing.

## 2014-11-08 NOTE — ED Notes (Signed)
Patient reports have had multiple seizures in past few months. Patient reports "Felt one coming today so he laid in floor." Patient denies having a seizure. Patient c/o pain in tongue.

## 2014-11-08 NOTE — ED Notes (Signed)
Pt left ambulatory without difficulty.  

## 2014-11-08 NOTE — ED Provider Notes (Signed)
CSN: [id number]     Arrival date & time 11/08/14  1504 History   First MD Initiated Contact with Patient 11/08/14 1516     Chief Complaint  Patient presents with  . Seizures     (Consider location/radiation/quality/duration/timing/severity/associated sxs/prior Treatment) HPI Comments: Pt is known alcoholic an has sz d/o - not taking meds appropriately - states had syncopal episode pta - had been heavily intoxicated today as well.  He denies known seizure and had no post ictal phase - recently seen here with seizure and sub there dilantin level.  The patient denies any pain, vomiting, fever, headache, blurred vision. He states that he felt like he was going to have a seizure but does not think that he had one, that being said he does endorse falling to the ground but does not know why.the symptoms were acute in onset, they occurred just prior to arrival, he is now awake and alert  Patient is a 48 y.o. male presenting with seizures. The history is provided by the patient.  Seizures   Past Medical History  Diagnosis Date  . Pancreatitis, acute     First episode earlier in 2012, hospitalized again in 02/2012 and 09/2012  . Hypertension     noncompliance  . Hyperlipidemia     noncompliance  . Lung collapse     h/o  . ETOH abuse   . Cocaine abuse   . DTs (delirium tremens)     history of  . Tobacco abuse   . Seizure disorder 07/17/2013  . Noncompliance 07/17/2013  . Traumatic subdural hematoma 06/15/2013  . 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 Open skull fracture 06/15/2013  . Polysubstance abuse     etoh, cocaine  . Nephrolithiasis   . Seizures    Past Surgical History  Procedure Laterality Date  . Mandible fracture surgery  2006  . Left axillary      surgery for deep laceration   Family History  Problem Relation Age of Onset  . Diabetes type II Mother   . Liver disease Neg Hx   . Colon cancer Neg Hx   . GI problems Neg Hx    History  Substance Use Topics  .  Smoking status: Current Every Day Smoker -- 1.00 packs/day for 20 years    Types: Cigarettes  . Smokeless tobacco: Never Used  . Alcohol Use: Yes     Comment: 3 bottles of wine per day     Review of Systems  Neurological: Positive for seizures.  All other systems reviewed and are negative.     Allergies  Review of patient's allergies indicates no known allergies.  Home Medications   Prior to Admission medications   Medication Sig Start Date End Date Taking? Authorizing Provider  phenytoin (DILANTIN) 100 MG ER capsule Take 3 capsules (300 mg total) by mouth at bedtime. 08/15/14   Audree Camel, MD   BP 104/80 mmHg  Pulse 129  Temp(Src) 98.9 F (37.2 C) (Oral)  Resp 18  Ht 6' (1.829 m)  Wt 144 lb (65.318 kg)  BMI 19.53 kg/m2  SpO2 98% Physical Exam  Constitutional: He appears well-developed and well-nourished. No distress.  HENT:  Head: Normocephalic and atraumatic.  Mouth/Throat: Oropharynx is clear and moist. No oropharyngeal exudate.  No tongue biting  Eyes: Conjunctivae and EOM are normal. Pupils are equal, round, and reactive to light. Right eye exhibits no discharge. Left eye exhibits no discharge. No scleral icterus.  Neck: Normal range of motion. Neck supple.  No JVD present. No thyromegaly present.  Cardiovascular: Regular rhythm, normal heart sounds and intact distal pulses.  Exam reveals no gallop and no friction rub.   No murmur heard. Tachycardic to 125  Pulmonary/Chest: Effort normal and breath sounds normal. No respiratory distress. He has no wheezes. He has no rales.  Abdominal: Soft. Bowel sounds are normal. He exhibits no distension and no mass. There is no tenderness.  Musculoskeletal: Normal range of motion. He exhibits no edema or tenderness.  Lymphadenopathy:    He has no cervical adenopathy.  Neurological: He is alert. Coordination normal.  Cranial nerves III through XII are normal, moving all extremities 4 without difficulty  Skin: Skin is  warm and dry. No rash noted. No erythema.  Psychiatric: He has a normal mood and affect. His behavior is normal.  Nursing note and vitals reviewed.   ED Course  Procedures (including critical care time) Labs Review Labs Reviewed  PHENYTOIN LEVEL, TOTAL - Abnormal; Notable for the following:    Phenytoin Lvl <2.5 (*)    All other components within normal limits  COMPREHENSIVE METABOLIC PANEL - Abnormal; Notable for the following:    Glucose, Bld 274 (*)    Calcium 7.6 (*)    Albumin 2.5 (*)    AST 225 (*)    ALT 83 (*)    Alkaline Phosphatase 156 (*)    GFR calc non Af Amer 86 (*)    All other components within normal limits  ETHANOL - Abnormal; Notable for the following:    Alcohol, Ethyl (B) 301 (*)    All other components within normal limits  CBC WITH DIFFERENTIAL - Abnormal; Notable for the following:    RBC 4.13 (*)    MCH 34.1 (*)    Platelets 114 (*)    Neutrophils Relative % 39 (*)    Lymphocytes Relative 57 (*)    All other components within normal limits    Imaging Review No results found.   EKG Interpretation   Date/Time:  Tuesday November 08 2014 15:28:05 EST Ventricular Rate:  125 PR Interval:  121 QRS Duration: 98 QT Interval:  354 QTC Calculation: 510 R Axis:   77 Text Interpretation:  Sinus tachycardia Nonspecific repolarization  abnormalities Prolonged QT interval Abnormal ekg since last tracing no  significant change Confirmed by Hyacinth MeekerMILLER  MD, Natale Barba ([id number]) on 11/08/2014  5:15:30 PM      MDM   Final diagnoses:  Syncope, unspecified syncope type  Alcohol abuse    The patient had a possible syncopal episode, he may be subtherapeutic again, he smells heavily of alcohol and is tachycardic, we'll initiate a metabolic workup as well as check for Dilantin level, fluids, seizure precautions.  Over the course of the patient's evaluation he had no abdominal pain, his vital signs improved slightly with a slightly improved pulse, the patient had a  normal mental status and because he would not be given any more than Tylenol for his tongue pain (no evidence of injury) he refused to stay for further evaluation and treatment. It was only after he left that his Dilantin level was found to be below detectable levels, the patient was adamant that he was taking his medication as prescribed, only missing occasional doses. I strongly encouraged him to take his medication exactly as prescribed while he was here, he told me that he had medication at home. His EKG was unchanged, there is a prolonged QT interval which is unchanged, the patient had a stable gait on  discharge.  The patient left AGAINST MEDICAL ADVICE, he was informed that this could be hazardous to his health, he had the ability to make this decision based on his clear sensorium with my discussion with him.this is very typical behavior for this patient who has been seen multiple times for seizures or seizure-like activity and has been subtherapeutic on his medicationsoccasionally leaving AGAINST MEDICAL ADVICE  Vida RollerBrian D Hiroshi Krummel, MD 11/08/14 [phone]

## 2014-11-16 ENCOUNTER — Encounter (HOSPITAL_COMMUNITY): Payer: Self-pay | Admitting: *Deleted

## 2014-11-16 ENCOUNTER — Emergency Department (HOSPITAL_COMMUNITY)
Admission: EM | Admit: 2014-11-16 | Discharge: 2014-11-16 | Discharge status: Against Medical Advice | Payer: MEDICAID | Attending: Emergency Medicine | Admitting: Emergency Medicine

## 2014-11-16 DIAGNOSIS — Z8709 Personal history of other diseases of the respiratory system: Secondary | ICD-10-CM | POA: Insufficient documentation

## 2014-11-16 DIAGNOSIS — Z5181 Encounter for therapeutic drug level monitoring: Secondary | ICD-10-CM

## 2014-11-16 DIAGNOSIS — D649 Anemia, unspecified: Secondary | ICD-10-CM

## 2014-11-16 DIAGNOSIS — Z9889 Other specified postprocedural states: Secondary | ICD-10-CM | POA: Insufficient documentation

## 2014-11-16 DIAGNOSIS — Z9119 Patient's noncompliance with other medical treatment and regimen: Secondary | ICD-10-CM | POA: Insufficient documentation

## 2014-11-16 DIAGNOSIS — Z8639 Personal history of other endocrine, nutritional and metabolic disease: Secondary | ICD-10-CM | POA: Insufficient documentation

## 2014-11-16 DIAGNOSIS — R569 Unspecified convulsions: Secondary | ICD-10-CM

## 2014-11-16 DIAGNOSIS — I10 Essential (primary) hypertension: Secondary | ICD-10-CM | POA: Insufficient documentation

## 2014-11-16 DIAGNOSIS — D500 Iron deficiency anemia secondary to blood loss (chronic): Secondary | ICD-10-CM | POA: Insufficient documentation

## 2014-11-16 DIAGNOSIS — R748 Abnormal levels of other serum enzymes: Secondary | ICD-10-CM

## 2014-11-16 DIAGNOSIS — Z8781 Personal history of (healed) traumatic fracture: Secondary | ICD-10-CM | POA: Insufficient documentation

## 2014-11-16 DIAGNOSIS — Z8719 Personal history of other diseases of the digestive system: Secondary | ICD-10-CM | POA: Insufficient documentation

## 2014-11-16 DIAGNOSIS — Z720 Tobacco use: Secondary | ICD-10-CM | POA: Insufficient documentation

## 2014-11-16 DIAGNOSIS — E876 Hypokalemia: Secondary | ICD-10-CM

## 2014-11-16 DIAGNOSIS — Z87820 Personal history of traumatic brain injury: Secondary | ICD-10-CM | POA: Insufficient documentation

## 2014-11-16 DIAGNOSIS — E872 Acidosis: Secondary | ICD-10-CM | POA: Insufficient documentation

## 2014-11-16 DIAGNOSIS — R7989 Other specified abnormal findings of blood chemistry: Secondary | ICD-10-CM

## 2014-11-16 DIAGNOSIS — G40909 Epilepsy, unspecified, not intractable, without status epilepticus: Secondary | ICD-10-CM | POA: Insufficient documentation

## 2014-11-16 DIAGNOSIS — Z9114 Patient's other noncompliance with medication regimen: Secondary | ICD-10-CM

## 2014-11-16 DIAGNOSIS — Z79899 Other long term (current) drug therapy: Secondary | ICD-10-CM | POA: Insufficient documentation

## 2014-11-16 DIAGNOSIS — R7889 Finding of other specified substances, not normally found in blood: Secondary | ICD-10-CM | POA: Insufficient documentation

## 2014-11-16 DIAGNOSIS — Z87442 Personal history of urinary calculi: Secondary | ICD-10-CM | POA: Insufficient documentation

## 2014-11-16 LAB — CBC WITH DIFFERENTIAL/PLATELET
Basophils Absolute: 0 10*3/uL (ref 0.0–0.1)
Basophils Relative: 0 % (ref 0–1)
Eosinophils Absolute: 0 10*3/uL (ref 0.0–0.7)
Eosinophils Relative: 1 % (ref 0–5)
HCT: 26 % — ABNORMAL LOW (ref 39.0–52.0)
Hemoglobin: 9.1 g/dL — ABNORMAL LOW (ref 13.0–17.0)
LYMPHS ABS: 1.9 10*3/uL (ref 0.7–4.0)
Lymphocytes Relative: 52 % — ABNORMAL HIGH (ref 12–46)
MCH: 34.6 pg — ABNORMAL HIGH (ref 26.0–34.0)
MCHC: 35 g/dL (ref 30.0–36.0)
MCV: 98.9 fL (ref 78.0–100.0)
MONOS PCT: 9 % (ref 3–12)
Monocytes Absolute: 0.4 10*3/uL (ref 0.1–1.0)
Neutro Abs: 1.4 10*3/uL — ABNORMAL LOW (ref 1.7–7.7)
Neutrophils Relative %: 38 % — ABNORMAL LOW (ref 43–77)
PLATELETS: 114 10*3/uL — AB (ref 150–400)
RBC: 2.63 MIL/uL — AB (ref 4.22–5.81)
RDW: 15.4 % (ref 11.5–15.5)
SMEAR REVIEW: DECREASED
WBC: 3.7 10*3/uL — AB (ref 4.0–10.5)

## 2014-11-16 LAB — COMPREHENSIVE METABOLIC PANEL
ALT: 89 U/L — ABNORMAL HIGH (ref 0–53)
AST: 49 U/L — AB (ref 0–37)
Albumin: 2.3 g/dL — ABNORMAL LOW (ref 3.5–5.2)
Alkaline Phosphatase: 144 U/L — ABNORMAL HIGH (ref 39–117)
Anion gap: 11 (ref 5–15)
BUN: 8 mg/dL (ref 6–23)
CALCIUM: 7.8 mg/dL — AB (ref 8.4–10.5)
CO2: 27 mEq/L (ref 19–32)
Chloride: 96 mEq/L (ref 96–112)
Creatinine, Ser: 0.82 mg/dL (ref 0.50–1.35)
GFR calc non Af Amer: >90 mL/min (ref >90)
GLUCOSE: 126 mg/dL — AB (ref 70–99)
Potassium: 3 mEq/L — ABNORMAL LOW (ref 3.7–5.3)
SODIUM: 134 meq/L — AB (ref 137–147)
Total Bilirubin: 1 mg/dL (ref 0.3–1.2)
Total Protein: 5.6 g/dL — ABNORMAL LOW (ref 6.0–8.3)

## 2014-11-16 LAB — TROPONIN I

## 2014-11-16 LAB — LACTIC ACID, PLASMA: Lactic Acid, Venous: 2.4 mmol/L — ABNORMAL HIGH (ref 0.5–2.2)

## 2014-11-16 LAB — PHENYTOIN LEVEL, TOTAL

## 2014-11-16 LAB — CK: Total CK: 389 U/L — ABNORMAL HIGH (ref 7–232)

## 2014-11-16 LAB — ETHANOL: Alcohol, Ethyl (B): <11 mg/dL (ref 0–11)

## 2014-11-16 MED ORDER — SODIUM CHLORIDE 0.9 % IV SOLN
1000.0000 mL | Freq: Once | INTRAVENOUS | Status: AC
  Administered 2014-11-16: 1000 mL via INTRAVENOUS

## 2014-11-16 MED ORDER — SODIUM CHLORIDE 0.9 % IV SOLN
1000.0000 mg | Freq: Once | INTRAVENOUS | Status: DC
  Filled 2014-11-16: qty 20

## 2014-11-16 MED ORDER — SODIUM CHLORIDE 0.9 % IV SOLN: 1000.0000 mL | INTRAVENOUS | Status: DC

## 2014-11-16 MED ORDER — POTASSIUM CHLORIDE CRYS ER 20 MEQ PO TBCR: 40.0000 meq | EXTENDED_RELEASE_TABLET | Freq: Once | ORAL | Status: DC

## 2014-11-16 NOTE — ED Provider Notes (Signed)
CSN: [id number]     Arrival date & time 11/16/14  [id number] History   First MD Initiated Contact with Patient 11/16/14 [phone]     Chief Complaint  Patient presents with  . Near Syncope     (Consider location/radiation/quality/duration/timing/severity/associated sxs/prior Treatment) Patient is a 48 y.o. male presenting with near-syncope. The history is provided by the patient.  Near Syncope  He states that he has fallen out multiple times in the last 2 days. He states that it has happened at least 4 times. He does not have any warning that he is going to fall out. He thinks loss of consciousness is relatively brief. He denies chest pain or palpitations. These episodes have been associated with bowel and bladder incontinence and tongue. He does have history of seizure disorder and states that he takes his Dilantin intermittently. He has a history of alcohol abuse but denies recent alcohol ingestion and denies any illicit drug use.  Past Medical History  Diagnosis Date  . Pancreatitis, acute     First episode earlier in 2012, hospitalized again in 02/2012 and 09/2012  . Hypertension     noncompliance  . Hyperlipidemia     noncompliance  . Lung collapse     h/o  . ETOH abuse   . Cocaine abuse   . DTs (delirium tremens)     history of  . Tobacco abuse   . Seizure disorder 07/17/2013  . Noncompliance 07/17/2013  . Traumatic subdural hematoma 06/15/2013  . 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 Open skull fracture 06/15/2013  . Polysubstance abuse     etoh, cocaine  . Nephrolithiasis   . Seizures    Past Surgical History  Procedure Laterality Date  . Mandible fracture surgery  2006  . Left axillary      surgery for deep laceration   Family History  Problem Relation Age of Onset  . Diabetes type II Mother   . Liver disease Neg Hx   . Colon cancer Neg Hx   . GI problems Neg Hx    History  Substance Use Topics  . Smoking status: Current Every Day Smoker -- 1.00 packs/day  for 20 years    Types: Cigarettes  . Smokeless tobacco: Never Used  . Alcohol Use: Yes     Comment: 3 bottles of wine per day     Review of Systems  Cardiovascular: Positive for near-syncope.  All other systems reviewed and are negative.     Allergies  Review of patient's allergies indicates no known allergies.  Home Medications   Prior to Admission medications   Medication Sig Start Date End Date Taking? Authorizing Provider  phenytoin (DILANTIN) 100 MG ER capsule Take 3 capsules (300 mg total) by mouth at bedtime. 08/15/14  Yes Audree CamelScott T Goldston, MD   BP 114/95 mmHg  Pulse 115  Temp(Src) 98.3 F (36.8 C) (Oral)  Resp 18  Ht 6' (1.829 m)  Wt 144 lb (65.318 kg)  BMI 19.53 kg/m2  SpO2 100% Physical Exam  Nursing note and vitals reviewed.  48 year old male, resting comfortably and in no acute distress. Vital signs are significant for tachycardia. Oxygen saturation is 100%, which is normal. Head is normocephalic and atraumatic. PERRLA, EOMI. Oropharynx is clear. Tongue shows scars from previous episodes of biting but no apparent acute injury. Neck is nontender and supple without adenopathy or JVD. Back is nontender and there is no CVA tenderness. Lungs are clear without rales, wheezes, or rhonchi. Chest is nontender. Heart  has regular rate and rhythm without murmur. Abdomen is soft, flat, nontender without masses or hepatosplenomegaly and peristalsis is normoactive. Extremities have no cyanosis or edema, full range of motion is present. Skin is warm and dry without rash. Neurologic: Mental status is normal, cranial nerves are intact, there are no motor or sensory deficits.  ED Course  Procedures (including critical care time) Labs Review Results for orders placed or performed during the hospital encounter of 11/08/14  Phenytoin level, total  Result Value Ref Range   Phenytoin Lvl <2.5 (L) 10.0 - 20.0 ug/mL  Comprehensive metabolic panel  Result Value Ref Range    Sodium 138 137 - 147 mEq/L   Potassium 4.3 3.7 - 5.3 mEq/L   Chloride 98 96 - 112 mEq/L   CO2 26 19 - 32 mEq/L   Glucose, Bld 274 (H) 70 - 99 mg/dL   BUN 14 6 - 23 mg/dL   Creatinine, Ser 1.61 0.50 - 1.35 mg/dL   Calcium 7.6 (L) 8.4 - 10.5 mg/dL   Total Protein 6.2 6.0 - 8.3 g/dL   Albumin 2.5 (L) 3.5 - 5.2 g/dL   AST 096 (H) 0 - 37 U/L   ALT 83 (H) 0 - 53 U/L   Alkaline Phosphatase 156 (H) 39 - 117 U/L   Total Bilirubin 0.9 0.3 - 1.2 mg/dL   GFR calc non Af Amer 86 (L) >90 mL/min   GFR calc Af Amer >90 >90 mL/min   Anion gap 14 5 - 15  Ethanol  Result Value Ref Range   Alcohol, Ethyl (B) 301 (H) 0 - 11 mg/dL  CBC with Differential  Result Value Ref Range   WBC 4.5 4.0 - 10.5 K/uL   RBC 4.13 (L) 4.22 - 5.81 MIL/uL   Hemoglobin 14.1 13.0 - 17.0 g/dL   HCT 04.5 40.9 - 81.1 %   MCV 97.3 78.0 - 100.0 fL   MCH 34.1 (H) 26.0 - 34.0 pg   MCHC 35.1 30.0 - 36.0 g/dL   RDW 91.4 78.2 - 95.6 %   Platelets 114 (L) 150 - 400 K/uL   Neutrophils Relative % 39 (L) 43 - 77 %   Neutro Abs 1.8 1.7 - 7.7 K/uL   Lymphocytes Relative 57 (H) 12 - 46 %   Lymphs Abs 2.6 0.7 - 4.0 K/uL   Monocytes Relative 3 3 - 12 %   Monocytes Absolute 0.1 0.1 - 1.0 K/uL   Eosinophils Relative 0 0 - 5 %   Eosinophils Absolute 0.0 0.0 - 0.7 K/uL   Basophils Relative 0 0 - 1 %   Basophils Absolute 0.0 0.0 - 0.1 K/uL     EKG Interpretation   Date/Time:  Wednesday November 16 2014 06:39:20 EST Ventricular Rate:  91 PR Interval:  141 QRS Duration: 97 QT Interval:  464 QTC Calculation: 571 R Axis:   57 Text Interpretation:  Sinus rhythm Prolonged QT interval Otherwise within  normal limits When compared with ECG of 11/08/2014, HEART RATE has  decreased Nonspecific ST abnormality has improved Confirmed by [facility]  MD,  Nijee Heatwole ([id number]) on 11/16/2014 6:48:52 AM      MDM   Final diagnoses:  Seizure  Subtherapeutic phenytoin level  Noncompliance with medication treatment due to intermittent use of medication   Hypokalemia  Elevated liver enzymes  Elevated lactic acid level  Elevated CK  Normochromic normocytic anemia    "falling out" which probably represents seizures. Old records are reviewed and he has a history of noncompliance with  his phenytoin. Blood level of phenytoin will be checked. When confronted about history of phenytoin noncompliance he states that it doesn't help him at all. He was advised that he needs to take it on a regular basis to see if it is indeed helping him. Because of tachycardia, he is given IV hydration.   Phenytoin level has come back undetectable and he is given a loading dose of phenytoin. Ethanol level is 0. Mild elevation of transaminases as noted. Hypokalemia is noted and is given oral potassium. He has an unexplained drop of hemoglobin to 9.1. There is no obvious source of blood loss. Case is endorsed to Dr. Hyacinth MeekerMiller.    Dione Boozeavid Keirstyn Aydt, MD 11/16/14 [phone]

## 2014-11-16 NOTE — ED Notes (Signed)
Patient noted to walk out of department without informing staff. Ambulated with steady gait. IV pulled out by patient and found in sink. Patient did not receive ordered medications due to leaving.

## 2014-11-16 NOTE — ED Provider Notes (Signed)
The pt left AMA - meds were ordered for seizures, he left prior to administration - he is not intoxicated, he has a steady gate, he has had no witnessed seizures here,   Date: 11/16/2014 Patient: Paul [name] Admitted: 11/16/2014  6:15 AM Attending Provider: Vida RollerBrian D Derreck Wiltsey, MD  Paul [name] or his authorized caregiver has made the decision for the patient to leave the emergency department against the advice of Vida RollerBrian D Matayah Reyburn, MD.  He or his authorized caregiver has been informed and understands the inherent risks, including death.  He has decided to accept the responsibility for this decision. Paul [name] has been advised that he may return for further evaluation or treatment. His condition at time of discharge was Fair.  Paul [name] had current vital signs as follows:  Blood pressure 142/108, pulse 95, temperature 98.3 F (36.8 C), temperature source Oral, resp. rate 20, height 6' (1.829 m), weight 144 lb (65.318 kg), SpO2 100 %.   Paul [name] refused to sign the Leaving Against Medical Advice form prior to leaving the department.  Vida RollerMILLER,Chelcea Zahn D 11/16/2014      Vida RollerBrian D Vercie Pokorny, MD 11/16/14 [phone]

## 2014-11-16 NOTE — ED Notes (Signed)
Patient states he can not give a urine sample at this time. Patient would like something for pain. RN made aware.

## 2014-11-16 NOTE — ED Notes (Signed)
Pt states he has been "falling out the past few days"

## 2014-12-06 ENCOUNTER — Emergency Department (HOSPITAL_COMMUNITY)
Admission: EM | Admit: 2014-12-06 | Discharge: 2014-12-06 | Disposition: A | Discharge status: Home / Self Care | Payer: MEDICAID | Attending: Emergency Medicine | Admitting: Emergency Medicine

## 2014-12-06 ENCOUNTER — Emergency Department (HOSPITAL_COMMUNITY)
Admission: EM | Admit: 2014-12-06 | Discharge: 2014-12-06 | Discharge status: Against Medical Advice | Payer: MEDICAID | Attending: Emergency Medicine | Admitting: Emergency Medicine

## 2014-12-06 ENCOUNTER — Encounter (HOSPITAL_COMMUNITY): Payer: Self-pay | Admitting: Emergency Medicine

## 2014-12-06 ENCOUNTER — Encounter (HOSPITAL_COMMUNITY): Payer: Self-pay

## 2014-12-06 DIAGNOSIS — G40909 Epilepsy, unspecified, not intractable, without status epilepticus: Secondary | ICD-10-CM | POA: Insufficient documentation

## 2014-12-06 DIAGNOSIS — Z8781 Personal history of (healed) traumatic fracture: Secondary | ICD-10-CM | POA: Insufficient documentation

## 2014-12-06 DIAGNOSIS — I10 Essential (primary) hypertension: Secondary | ICD-10-CM | POA: Insufficient documentation

## 2014-12-06 DIAGNOSIS — E785 Hyperlipidemia, unspecified: Secondary | ICD-10-CM | POA: Insufficient documentation

## 2014-12-06 DIAGNOSIS — Z8709 Personal history of other diseases of the respiratory system: Secondary | ICD-10-CM | POA: Insufficient documentation

## 2014-12-06 DIAGNOSIS — R000 Tachycardia, unspecified: Secondary | ICD-10-CM | POA: Insufficient documentation

## 2014-12-06 DIAGNOSIS — Z79899 Other long term (current) drug therapy: Secondary | ICD-10-CM | POA: Insufficient documentation

## 2014-12-06 DIAGNOSIS — R569 Unspecified convulsions: Secondary | ICD-10-CM

## 2014-12-06 DIAGNOSIS — R252 Cramp and spasm: Secondary | ICD-10-CM | POA: Insufficient documentation

## 2014-12-06 DIAGNOSIS — Z720 Tobacco use: Secondary | ICD-10-CM | POA: Insufficient documentation

## 2014-12-06 DIAGNOSIS — G43909 Migraine, unspecified, not intractable, without status migrainosus: Secondary | ICD-10-CM | POA: Insufficient documentation

## 2014-12-06 DIAGNOSIS — Z8719 Personal history of other diseases of the digestive system: Secondary | ICD-10-CM | POA: Insufficient documentation

## 2014-12-06 DIAGNOSIS — Z87442 Personal history of urinary calculi: Secondary | ICD-10-CM | POA: Insufficient documentation

## 2014-12-06 DIAGNOSIS — Z8659 Personal history of other mental and behavioral disorders: Secondary | ICD-10-CM | POA: Insufficient documentation

## 2014-12-06 DIAGNOSIS — Z9119 Patient's noncompliance with other medical treatment and regimen: Secondary | ICD-10-CM | POA: Insufficient documentation

## 2014-12-06 DIAGNOSIS — Z8639 Personal history of other endocrine, nutritional and metabolic disease: Secondary | ICD-10-CM | POA: Insufficient documentation

## 2014-12-06 DIAGNOSIS — Z9889 Other specified postprocedural states: Secondary | ICD-10-CM | POA: Insufficient documentation

## 2014-12-06 DIAGNOSIS — K146 Glossodynia: Secondary | ICD-10-CM

## 2014-12-06 DIAGNOSIS — Z9181 History of falling: Secondary | ICD-10-CM | POA: Insufficient documentation

## 2014-12-06 DIAGNOSIS — R5383 Other fatigue: Secondary | ICD-10-CM | POA: Insufficient documentation

## 2014-12-06 DIAGNOSIS — Z9114 Patient's other noncompliance with medication regimen: Secondary | ICD-10-CM

## 2014-12-06 LAB — BASIC METABOLIC PANEL
ANION GAP: 17 — AB (ref 5–15)
BUN: 9 mg/dL (ref 6–23)
CALCIUM: 8.9 mg/dL (ref 8.4–10.5)
CHLORIDE: 100 meq/L (ref 96–112)
CO2: 26 mEq/L (ref 19–32)
CREATININE: 1.38 mg/dL — AB (ref 0.50–1.35)
GFR calc non Af Amer: 59 mL/min — ABNORMAL LOW (ref >90)
GFR, EST AFRICAN AMERICAN: 68 mL/min — AB (ref >90)
Glucose, Bld: 177 mg/dL — ABNORMAL HIGH (ref 70–99)
Potassium: 4.1 mEq/L (ref 3.7–5.3)
Sodium: 143 mEq/L (ref 137–147)

## 2014-12-06 LAB — PHENYTOIN LEVEL, TOTAL: Phenytoin Lvl: <2.5 ug/mL — ABNORMAL LOW (ref 10.0–20.0)

## 2014-12-06 LAB — ETHANOL: Alcohol, Ethyl (B): <11 mg/dL (ref 0–11)

## 2014-12-06 MED ORDER — PHENYTOIN SODIUM EXTENDED 100 MG PO CAPS: 300.0000 mg | ORAL_CAPSULE | Freq: Every day | ORAL | Status: DC

## 2014-12-06 MED ORDER — ACETAMINOPHEN 500 MG PO TABS
1000.0000 mg | ORAL_TABLET | Freq: Once | ORAL | Status: AC
  Administered 2014-12-06: 1000 mg via ORAL
  Filled 2014-12-06: qty 2

## 2014-12-06 MED ORDER — PHENYTOIN SODIUM EXTENDED 100 MG PO CAPS
300.0000 mg | ORAL_CAPSULE | Freq: Once | ORAL | Status: DC
  Filled 2014-12-06: qty 3

## 2014-12-06 MED ORDER — PHENYTOIN SODIUM EXTENDED 100 MG PO CAPS: 100.0000 mg | ORAL_CAPSULE | Freq: Three times a day (TID) | ORAL | Status: DC

## 2014-12-06 MED ORDER — PHENYTOIN SODIUM EXTENDED 100 MG PO CAPS
100.0000 mg | ORAL_CAPSULE | Freq: Once | ORAL | Status: AC
  Administered 2014-12-06: 100 mg via ORAL
  Filled 2014-12-06: qty 1

## 2014-12-06 MED ORDER — PHENYTOIN SODIUM EXTENDED 100 MG PO CAPS
400.0000 mg | ORAL_CAPSULE | Freq: Once | ORAL | Status: DC
  Filled 2014-12-06 (×2): qty 4

## 2014-12-06 MED ORDER — ACETAMINOPHEN 500 MG PO TABS
1000.0000 mg | ORAL_TABLET | Freq: Once | ORAL | Status: DC
  Filled 2014-12-06: qty 2

## 2014-12-06 NOTE — ED Provider Notes (Addendum)
CSN: [id number]     Arrival date & time 12/06/14  1030 History  This chart was scribed for Paul [name] Hadley Soileau, MD by Paul [name], ED Scribe. This patient was seen in room APA09/APA09 and the patient's care was started at 10:51 AM.    Chief Complaint  Patient presents with  . Seizures    The history is provided by the patient. No language interpreter was used.    HPI Comments: Paul [name] is a 48 y.o. male with a history of seizures who presents to the Emergency Department after a seizure that occurred earlier today. Pt states he "fell out" which is typical of his episodes. He reports generalized weakness as an associated symptom. Pt notes that he takes Dilantin somewhat regularly, but does not have enough at home. He denies other medical issues. Pt smokes and drinks EtOH, but states he did not drink a lot last night. He denies SOB and confusion as associated symptoms. He also denies any other injuries as a result of the episode.  Past Medical History  Diagnosis Date  . Pancreatitis, acute     First episode earlier in 2012, hospitalized again in 02/2012 and 09/2012  . Hypertension     noncompliance  . Hyperlipidemia     noncompliance  . Lung collapse     h/o  . ETOH abuse   . Cocaine abuse   . DTs (delirium tremens)     history of  . Tobacco abuse   . Seizure disorder 07/17/2013  . Noncompliance 07/17/2013  . Traumatic subdural hematoma 06/15/2013  . 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 Open skull fracture 06/15/2013  . Polysubstance abuse     etoh, cocaine  . Nephrolithiasis   . Seizures    Past Surgical History  Procedure Laterality Date  . Mandible fracture surgery  2006  . Left axillary      surgery for deep laceration   Family History  Problem Relation Age of Onset  . Diabetes type II Mother   . Liver disease Neg Hx   . Colon cancer Neg Hx   . GI problems Neg Hx    History  Substance Use Topics  . Smoking status: Current Every Day Smoker -- 1.00  packs/day for 20 years    Types: Cigarettes  . Smokeless tobacco: Never Used  . Alcohol Use: Yes     Comment: 3 bottles of wine per day     Review of Systems  Constitutional:       Per HPI, otherwise negative  HENT:       Per HPI, otherwise negative  Respiratory: Negative for shortness of breath.        Per HPI, otherwise negative  Cardiovascular:       Per HPI, otherwise negative  Gastrointestinal: Negative for vomiting.  Endocrine:       Negative aside from HPI  Genitourinary:       Neg aside from HPI   Musculoskeletal:       Per HPI, otherwise negative  Skin: Negative.   Neurological: Positive for seizures and weakness. Negative for syncope.  Psychiatric/Behavioral: Negative for confusion.   Allergies  Review of patient's allergies indicates no known allergies.  Home Medications   Prior to Admission medications   Medication Sig Start Date End Date Taking? Authorizing Provider  phenytoin (DILANTIN) 100 MG ER capsule Take 3 capsules (300 mg total) by mouth at bedtime. 08/15/14   Audree CamelScott T Goldston, MD   BP 161/116 mmHg  Pulse 116  Temp(Src) 98 F (36.7 C)  Resp 16  Ht 6' (1.829 m)  Wt 140 lb (63.504 kg)  BMI 18.98 kg/m2  SpO2 98% Physical Exam  Constitutional: He is oriented to person, place, and time. He appears well-developed. No distress.  HENT:  Head: Normocephalic and atraumatic.  Eyes: Conjunctivae and EOM are normal.  Cardiovascular: Regular rhythm.   Tachycardia  Pulmonary/Chest: Effort normal. No stridor. No respiratory distress.  Breath sounds normal   Abdominal: He exhibits no distension.  Musculoskeletal: He exhibits no edema.  Neurological: He is alert and oriented to person, place, and time.  Skin: Skin is warm and dry.  Old laceration healing on right side of tongue. No active bleeding.  Psychiatric: He has a normal mood and affect.  Nursing note and vitals reviewed.   ED Course  Procedures (including critical care time) DIAGNOSTIC  STUDIES: Oxygen Saturation is 98% on RA, normal by my interpretation.    COORDINATION OF CARE: 10:59 AM Discussed treatment plan with pt which includes monitoring in the ED and medication. Pt agreed to plan.    1:21 PM Patient awake and alert, sitting upright on the edge of his bed.  He does describe diffuse pain. He has a history of seizures. He will be loaded with Dilantin, received pain medication, but discharged to follow-up with primary care  MDM  Patient presents after a witnessed seizure.  Patient recovers here unremarkable, received oral loading of his Dilantin.  Patient is discharged in stable condition to follow-up with primary care. Patient not receive additional narcotics here.   I personally performed the services described in this documentation, which was scribed in my presence. The recorded information has been reviewed and is accurate.     Paul [name] Paul Arvanitis, MD 12/06/14 1322  1:43 PM Patient left prior to receiving any medication  Paul [name] Paul Norbeck, MD 12/06/14 1343

## 2014-12-06 NOTE — Discharge Instructions (Signed)
As discussed, your evaluation today has been largely reassuring.  But, it is important that you monitor your condition carefully, and do not hesitate to return to the ED if you develop new, or concerning changes in your condition.  Otherwise, please follow-up with your physician for appropriate ongoing care.  Seizure, Adult A seizure is abnormal electrical activity in the brain. Seizures usually last from 30 seconds to 2 minutes. There are various types of seizures. Before a seizure, you may have a warning sensation (aura) that a seizure is about to occur. An aura may include the following symptoms:   Fear or anxiety.  Nausea.  Feeling like the room is spinning (vertigo).  Vision changes, such as seeing flashing lights or spots. Common symptoms during a seizure include:  A change in attention or behavior (altered mental status).  Convulsions with rhythmic jerking movements.  Drooling.  Rapid eye movements.  Grunting.  Loss of bladder and bowel control.  Bitter taste in the mouth.  Tongue biting. After a seizure, you may feel confused and sleepy. You may also have an injury resulting from convulsions during the seizure. HOME CARE INSTRUCTIONS   If you are given medicines, take them exactly as prescribed by your health care provider.  Keep all follow-up appointments as directed by your health care provider.  Do not swim or drive or engage in risky activity during which a seizure could cause further injury to you or others until your health care provider says it is OK.  Get adequate rest.  Teach friends and family what to do if you have a seizure. They should:  Lay you on the ground to prevent a fall.  Put a cushion under your head.  Loosen any tight clothing around your neck.  Turn you on your side. If vomiting occurs, this helps keep your airway clear.  Stay with you until you recover.  Know whether or not you need emergency care. SEEK IMMEDIATE MEDICAL CARE  IF:  The seizure lasts longer than 5 minutes.  The seizure is severe or you do not wake up immediately after the seizure.  You have an altered mental status after the seizure.  You are having more frequent or worsening seizures. Someone should drive you to the emergency department or call local emergency services (911 in [place] MAKE SURE YOU:  Understand these instructions.  Will watch your condition.  Will get help right away if you are not doing well or get worse. Document Released: 12/13/2000 Document Revised: 10/06/2013 Document Reviewed: 07/28/2013 [facility] Patient Information 2015 [employer], [place]. This information is not intended to replace advice given to you by your health care provider. Make sure you discuss any questions you have with your health care provider.

## 2014-12-06 NOTE — ED Notes (Signed)
Patient with seizure today. Witnessed by bystanders. Non-compliant with medications.

## 2014-12-06 NOTE — ED Notes (Signed)
MD at bedside. Talked with pt about checking lab values and deciding care after that point. Pt agreed to stay at hospital for tx.

## 2014-12-06 NOTE — ED Notes (Signed)
Pt was seen this am but left before medication was given to pt.   Pt presents with small bite mark to right side of tongue. Pt states he is having arm cramps. EDP made aware.

## 2014-12-06 NOTE — ED Notes (Signed)
Pt reports had a seizure 2 weeks ago and bit tongue.  Pt reports was here earlier today for same but left.

## 2014-12-06 NOTE — ED Provider Notes (Signed)
CSN: [id number]     Arrival date & time 12/06/14  1512 History  This chart was scribed for Paul RollerBrian D Sherise Geerdes, MD by Gwenyth Oberatherine Macek, ED Scribe. This patient was seen in room APA01/APA01 and the patient's care was started at 3:28 PM.    Chief Complaint  Patient presents with  . tongue pain    The history is provided by the patient. No language interpreter was used.    HPI Comments: Paul [name] is a 48 y.o. male who presents to the Emergency Department complaining of cosntant tongue pain and bilateral hand cramping that started earlier today. Pt was seen in the ED this morning after 1 episode of seizure and left AMA before he took the medicines that were ordered for him including a dilantin load. He states his last drink of EtOH was 2 days ago. Pt states he has not been taking Dilantin because he ran out. He notes that he did not refill his last prescription because of financial reasons. Pt denies any additional episodes of seizure since this morning.  Past Medical History  Diagnosis Date  . Pancreatitis, acute     First episode earlier in 2012, hospitalized again in 02/2012 and 09/2012  . Hypertension     noncompliance  . Hyperlipidemia     noncompliance  . Lung collapse     h/o  . ETOH abuse   . Cocaine abuse   . DTs (delirium tremens)     history of  . Tobacco abuse   . Seizure disorder 07/17/2013  . Noncompliance 07/17/2013  . Traumatic subdural hematoma 06/15/2013  . 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 Open skull fracture 06/15/2013  . Polysubstance abuse     etoh, cocaine  . Nephrolithiasis   . Seizures    Past Surgical History  Procedure Laterality Date  . Mandible fracture surgery  2006  . Left axillary      surgery for deep laceration   Family History  Problem Relation Age of Onset  . Diabetes type II Mother   . Liver disease Neg Hx   . Colon cancer Neg Hx   . GI problems Neg Hx    History  Substance Use Topics  . Smoking status: Current Every Day  Smoker -- 1.00 packs/day for 20 years    Types: Cigarettes  . Smokeless tobacco: Never Used  . Alcohol Use: Yes    Review of Systems  HENT:       Tongue pain  Musculoskeletal:       Hand cramping  Neurological: Positive for seizures.  All other systems reviewed and are negative.   Allergies  Review of patient's allergies indicates no known allergies.  Home Medications   Prior to Admission medications   Medication Sig Start Date End Date Taking? Authorizing Provider  phenytoin (DILANTIN) 100 MG ER capsule Take 3 capsules (300 mg total) by mouth at bedtime. 12/06/14   Gerhard Munchobert Lockwood, MD  phenytoin (DILANTIN) 100 MG ER capsule Take 1 capsule (100 mg total) by mouth 3 (three) times daily. 12/06/14   Paul RollerBrian D Janeliz Prestwood, MD   BP 167/70 mmHg  Pulse 123  Temp(Src) 99.1 F (37.3 C) (Oral)  Resp 18  SpO2 100% Physical Exam  Constitutional: He appears well-developed and well-nourished. No distress.  HENT:  Head: Normocephalic and atraumatic.  Mouth/Throat: Oropharynx is clear and moist. No oropharyngeal exudate.  Evidence of prior bite wounds; no active bleeding.  Eyes: Conjunctivae and EOM are normal. Pupils are equal, round, and reactive  to light. Right eye exhibits no discharge. Left eye exhibits no discharge. No scleral icterus.  Neck: Normal range of motion. Neck supple. No JVD present. No thyromegaly present.  Cardiovascular: Normal rate, regular rhythm, normal heart sounds and intact distal pulses.  Exam reveals no gallop and no friction rub.   No murmur heard. Pulmonary/Chest: Effort normal and breath sounds normal. No respiratory distress. He has no wheezes. He has no rales.  Musculoskeletal: Normal range of motion. He exhibits no edema or tenderness.  Lymphadenopathy:    He has no cervical adenopathy.  Neurological: He is alert. Coordination normal.  Stable gait; clear speech; no tremor; cranial nerves III-XII intact.  Skin: Skin is warm and dry. No erythema.  Psychiatric:  He has a normal mood and affect. His behavior is normal.  Nursing note and vitals reviewed.   ED Course  Procedures (including critical care time) DIAGNOSTIC STUDIES: Oxygen Saturation is 100% on RA, normal by my interpretation.    COORDINATION OF CARE: 3:31 PM Discussed treatment plan with pt which includes lab work and pt agreed to plan.  Labs Review Labs Reviewed  BASIC METABOLIC PANEL - Abnormal; Notable for the following:    Glucose, Bld 177 (*)    Creatinine, Ser 1.38 (*)    GFR calc non Af Amer 59 (*)    GFR calc Af Amer 68 (*)    Anion gap 17 (*)    All other components within normal limits  PHENYTOIN LEVEL, TOTAL - Abnormal; Notable for the following:    Phenytoin Lvl <2.5 (*)    All other components within normal limits  ETHANOL    Imaging Review No results found.    MDM   Final diagnoses:  Tongue pain  Non compliance w medication regimen    Ongoing complaints that are usually is normal complaints of feeling and pain, tongue pain, states that he has not had a seizure today yet he was seen earlier for a seizure. Denies drinking alcohol in days, no focal neurologic symptoms of withdrawal including no hallucinations, no tremor, stable gait. The patient has a history of drug-seeking behavior with pain requests, it complicates this in that he also has a history of noncompliance with his antiseizure medications and his history of frequent alcohol binges.  Check levels, medicate as needed.    BMP without acute findings - Cr slightly elevated - pt informed - fluids and recheck with PMD - phone numbers given, dilantin given, rx, given.  Meds given in ED:  Medications  phenytoin (DILANTIN) ER capsule 100 mg (not administered)  acetaminophen (TYLENOL) tablet 1,000 mg (1,000 mg Oral Given 12/06/14 1541)    New Prescriptions   PHENYTOIN (DILANTIN) 100 MG ER CAPSULE    Take 1 capsule (100 mg total) by mouth 3 (three) times daily.        Paul RollerBrian D Kabao Leite,  MD 12/06/14 1700

## 2014-12-06 NOTE — Care Management Note (Signed)
ED/CM noted patient did not have health insurance and/or PCP listed in the computer.  Patient was given the [place] resource handout with information on the clinics, food pantries, and the handout for new health insurance sign-up.  Patient expressed appreciation for information received. 

## 2014-12-06 NOTE — Discharge Instructions (Signed)
Please call your doctor for a followup appointment within 24-48 hours. When you talk to your doctor please let them know that you were seen in the emergency department and have them acquire all of your records so that they can discuss the findings with you and formulate a treatment plan to fully care for your new and ongoing problems. ° °[facility] Doctor List ° ° ° °Edward Hawkins MD. Specialty: Pulmonary Disease Contact information: [address]  °[address]  °[place] [address]  °[phone]  ° °Margaret Simpson, MD. Specialty: Family Medicine Contact information: [address]  °[place] [address]  °[phone]  ° °Scott Luking, MD. Specialty: Family Medicine Contact information: [address]  °[address]  °[place] [address]  °[phone]  ° °Tesfaye Fanta, MD Specialty: Internal Medicine Contact information: [address]  °[place] [address]  °[phone]  ° °Zach Hall, MD. Specialty: Internal Medicine Contact information: [address]  °[place] [address]  °[phone]  ° °Angus Mcinnis, MD. Specialty: Family Medicine Contact information: [address]  °[place] [address]  °[phone]  ° °Stephen Knowlton, MD. Specialty: Family Medicine Contact information: [address]  °[address]  °[place] [address]  °[phone]  ° °Roy Fagan, MD. Specialty: Internal Medicine Contact information: [address]  °[address]  °[place] [address]  °[phone]  ° ° °

## 2014-12-16 ENCOUNTER — Emergency Department (HOSPITAL_COMMUNITY)
Admission: EM | Admit: 2014-12-16 | Discharge: 2014-12-16 | Discharge status: Against Medical Advice | Payer: Self-pay | Attending: Emergency Medicine | Admitting: Emergency Medicine

## 2014-12-16 ENCOUNTER — Emergency Department (HOSPITAL_COMMUNITY): Payer: Self-pay

## 2014-12-16 ENCOUNTER — Encounter (HOSPITAL_COMMUNITY): Payer: Self-pay

## 2014-12-16 DIAGNOSIS — F10920 Alcohol use, unspecified with intoxication, uncomplicated: Secondary | ICD-10-CM

## 2014-12-16 DIAGNOSIS — Z87820 Personal history of traumatic brain injury: Secondary | ICD-10-CM | POA: Insufficient documentation

## 2014-12-16 DIAGNOSIS — Z8781 Personal history of (healed) traumatic fracture: Secondary | ICD-10-CM | POA: Insufficient documentation

## 2014-12-16 DIAGNOSIS — Z87442 Personal history of urinary calculi: Secondary | ICD-10-CM | POA: Insufficient documentation

## 2014-12-16 DIAGNOSIS — I10 Essential (primary) hypertension: Secondary | ICD-10-CM | POA: Insufficient documentation

## 2014-12-16 DIAGNOSIS — F10120 Alcohol abuse with intoxication, uncomplicated: Secondary | ICD-10-CM | POA: Insufficient documentation

## 2014-12-16 DIAGNOSIS — Z9114 Patient's other noncompliance with medication regimen: Secondary | ICD-10-CM

## 2014-12-16 DIAGNOSIS — G40909 Epilepsy, unspecified, not intractable, without status epilepticus: Secondary | ICD-10-CM | POA: Insufficient documentation

## 2014-12-16 DIAGNOSIS — Z720 Tobacco use: Secondary | ICD-10-CM | POA: Insufficient documentation

## 2014-12-16 DIAGNOSIS — Z8719 Personal history of other diseases of the digestive system: Secondary | ICD-10-CM | POA: Insufficient documentation

## 2014-12-16 DIAGNOSIS — E785 Hyperlipidemia, unspecified: Secondary | ICD-10-CM | POA: Insufficient documentation

## 2014-12-16 DIAGNOSIS — Z9119 Patient's noncompliance with other medical treatment and regimen: Secondary | ICD-10-CM | POA: Insufficient documentation

## 2014-12-16 HISTORY — DX: Malingerer (conscious simulation): Z76.5

## 2014-12-16 LAB — BASIC METABOLIC PANEL
Anion gap: 14 (ref 5–15)
BUN: 11 mg/dL (ref 6–23)
CHLORIDE: 107 meq/L (ref 96–112)
CO2: 22 meq/L (ref 19–32)
CREATININE: 0.87 mg/dL (ref 0.50–1.35)
Calcium: 7.6 mg/dL — ABNORMAL LOW (ref 8.4–10.5)
GFR calc Af Amer: >90 mL/min (ref >90)
GFR calc non Af Amer: >90 mL/min (ref >90)
Glucose, Bld: 146 mg/dL — ABNORMAL HIGH (ref 70–99)
Potassium: 3.6 mEq/L — ABNORMAL LOW (ref 3.7–5.3)
Sodium: 143 mEq/L (ref 137–147)

## 2014-12-16 LAB — PHENYTOIN LEVEL, TOTAL: Phenytoin Lvl: <2.5 ug/mL — ABNORMAL LOW (ref 10.0–20.0)

## 2014-12-16 LAB — CBC WITH DIFFERENTIAL/PLATELET
Basophils Absolute: 0 10*3/uL (ref 0.0–0.1)
Basophils Relative: 1 % (ref 0–1)
EOS ABS: 0 10*3/uL (ref 0.0–0.7)
EOS PCT: 0 % (ref 0–5)
HCT: 34.1 % — ABNORMAL LOW (ref 39.0–52.0)
HEMOGLOBIN: 11.4 g/dL — AB (ref 13.0–17.0)
LYMPHS ABS: 1.9 10*3/uL (ref 0.7–4.0)
Lymphocytes Relative: 53 % — ABNORMAL HIGH (ref 12–46)
MCH: 33.3 pg (ref 26.0–34.0)
MCHC: 33.4 g/dL (ref 30.0–36.0)
MCV: 99.7 fL (ref 78.0–100.0)
MONOS PCT: 8 % (ref 3–12)
Monocytes Absolute: 0.3 10*3/uL (ref 0.1–1.0)
Neutro Abs: 1.3 10*3/uL — ABNORMAL LOW (ref 1.7–7.7)
Neutrophils Relative %: 38 % — ABNORMAL LOW (ref 43–77)
Platelets: 227 10*3/uL (ref 150–400)
RBC: 3.42 MIL/uL — AB (ref 4.22–5.81)
RDW: 15.6 % — ABNORMAL HIGH (ref 11.5–15.5)
WBC: 3.5 10*3/uL — ABNORMAL LOW (ref 4.0–10.5)

## 2014-12-16 LAB — ETHANOL: Alcohol, Ethyl (B): 451 mg/dL (ref 0–11)

## 2014-12-16 MED ORDER — PHENYTOIN SODIUM EXTENDED 100 MG PO CAPS: 300.0000 mg | ORAL_CAPSULE | Freq: Once | ORAL | Status: DC

## 2014-12-16 MED ORDER — THIAMINE HCL 100 MG/ML IJ SOLN: 100.0000 mg | Freq: Every day | INTRAMUSCULAR | Status: DC

## 2014-12-16 MED ORDER — VITAMIN B-1 100 MG PO TABS
100.0000 mg | ORAL_TABLET | Freq: Every day | ORAL | Status: DC
  Administered 2014-12-16: 100 mg via ORAL
  Filled 2014-12-16: qty 1

## 2014-12-16 MED ORDER — LORAZEPAM 1 MG PO TABS: 0.0000 mg | ORAL_TABLET | Freq: Two times a day (BID) | ORAL | Status: DC

## 2014-12-16 MED ORDER — LORAZEPAM 1 MG PO TABS
0.0000 mg | ORAL_TABLET | Freq: Four times a day (QID) | ORAL | Status: DC
  Administered 2014-12-16: 1 mg via ORAL
  Filled 2014-12-16: qty 1

## 2014-12-16 NOTE — ED Provider Notes (Signed)
CSN: [id number]     Arrival date & time 12/16/14  1602 History   First MD Initiated Contact with Patient 12/16/14 1606     Chief Complaint  Patient presents with  . Alcohol Intoxication      Patient is a 48 y.o. male presenting with intoxication. The history is provided by the EMS personnel and the patient. The history is limited by the condition of the patient (alcohol intoxication).  Alcohol Intoxication  Pt was seen at 1610. Per EMS and pt report: [facility] Pt states he "drank a lot of alcohol today." No obvious signs of trauma per EMS. Pt himself denies any complaints.  The symptoms have been associated with no other complaints. The patient has a significant history of similar symptoms previously, recently being evaluated for this complaint and multiple prior evals for same.     Past Medical History  Diagnosis Date  . Pancreatitis, acute     First episode earlier in 2012, hospitalized again in 02/2012 and 09/2012  . Hypertension     noncompliance  . Hyperlipidemia     noncompliance  . Lung collapse     h/o  . ETOH abuse   . Cocaine abuse   . DTs (delirium tremens)     history of  . Tobacco abuse   . Seizure disorder 07/17/2013  . Noncompliance 07/17/2013  . Traumatic subdural hematoma 06/15/2013  . 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 Open skull fracture 06/15/2013  . Polysubstance abuse     etoh, cocaine  . Nephrolithiasis   . Seizures   . Drug-seeking behavior    Past Surgical History  Procedure Laterality Date  . Mandible fracture surgery  2006  . Left axillary      surgery for deep laceration   Family History  Problem Relation Age of Onset  . Diabetes type II Mother   . Liver disease Neg Hx   . Colon cancer Neg Hx   . GI problems Neg Hx    History  Substance Use Topics  . Smoking status: Current Every Day Smoker -- 1.00 packs/day for 20 years    Types: Cigarettes  . Smokeless tobacco: Never Used   . Alcohol Use: Yes    Review of Systems  Unable to perform ROS: Other    Allergies  Review of patient's allergies indicates no known allergies.  Home Medications   Prior to Admission medications   Medication Sig Start Date End Date Taking? Authorizing Provider  phenytoin (DILANTIN) 100 MG ER capsule Take 3 capsules (300 mg total) by mouth at bedtime. 12/06/14  Yes Gerhard Munchobert Lockwood, MD  phenytoin (DILANTIN) 100 MG ER capsule Take 1 capsule (100 mg total) by mouth 3 (three) times daily. 12/06/14  Yes Vida RollerBrian D Miller, MD   BP 150/114 mmHg  Pulse 106  Temp(Src) 97.5 F (36.4 C) (Oral)  Resp 18  Ht 5\' 10"  (1.778 m)  Wt 140 lb (63.504 kg)  BMI 20.09 kg/m2  SpO2 94% Physical Exam  1615: Physical examination:  Nursing notes reviewed; Vital signs and O2 SAT reviewed;  Constitutional: Well developed, Well nourished, Well hydrated, In no acute distress; Head:  Normocephalic, atraumatic; Eyes: EOMI, PERRL, No scleral icterus; ENMT: Mouth and pharynx normal, Mucous membranes moist; Neck: Supple, Full range of motion, No lymphadenopathy; Cardiovascular: Regular rate and rhythm, No gallop; Respiratory: Breath sounds clear & equal bilaterally, No wheezes.  Speaking full sentences with ease, Normal respiratory effort/excursion; Chest: Nontender,  Movement normal; Abdomen: Soft, Nontender, Nondistended, Normal bowel sounds; Genitourinary: No CVA tenderness; Spine:  No midline CS, TS, LS tenderness.;; Extremities: Pulses normal, No tenderness, No edema, No calf edema or asymmetry.; Neuro: Awake, alert. Major CN grossly intact. No facial droop. Speech slurred. Moves all extremities on stretcher spontaneously and to command without apparent gross focal motor deficits.; Skin: Color normal, Warm, Dry.   ED Course  Procedures     EKG Interpretation None      MDM  MDM Reviewed: previous chart, nursing note and vitals Reviewed previous: labs and CT scan Interpretation: labs and CT scan     Results  for orders placed or performed during the hospital encounter of 12/16/14  Basic metabolic panel  Result Value Ref Range   Sodium 143 137 - 147 mEq/L   Potassium 3.6 (L) 3.7 - 5.3 mEq/L   Chloride 107 96 - 112 mEq/L   CO2 22 19 - 32 mEq/L   Glucose, Bld 146 (H) 70 - 99 mg/dL   BUN 11 6 - 23 mg/dL   Creatinine, Ser 6.210.87 0.50 - 1.35 mg/dL   Calcium 7.6 (L) 8.4 - 10.5 mg/dL   GFR calc non Af Amer >90 >90 mL/min   GFR calc Af Amer >90 >90 mL/min   Anion gap 14 5 - 15  Ethanol  Result Value Ref Range   Alcohol, Ethyl (B) 451 (HH) 0 - 11 mg/dL  CBC with Differential  Result Value Ref Range   WBC 3.5 (L) 4.0 - 10.5 K/uL   RBC 3.42 (L) 4.22 - 5.81 MIL/uL   Hemoglobin 11.4 (L) 13.0 - 17.0 g/dL   HCT 30.834.1 (L) 65.739.0 - 84.652.0 %   MCV 99.7 78.0 - 100.0 fL   MCH 33.3 26.0 - 34.0 pg   MCHC 33.4 30.0 - 36.0 g/dL   RDW 96.215.6 (H) 95.211.5 - 84.115.5 %   Platelets 227 150 - 400 K/uL   Neutrophils Relative % 38 (L) 43 - 77 %   Neutro Abs 1.3 (L) 1.7 - 7.7 K/uL   Lymphocytes Relative 53 (H) 12 - 46 %   Lymphs Abs 1.9 0.7 - 4.0 K/uL   Monocytes Relative 8 3 - 12 %   Monocytes Absolute 0.3 0.1 - 1.0 K/uL   Eosinophils Relative 0 0 - 5 %   Eosinophils Absolute 0.0 0.0 - 0.7 K/uL   Basophils Relative 1 0 - 1 %   Basophils Absolute 0.0 0.0 - 0.1 K/uL  Phenytoin level, total  Result Value Ref Range   Phenytoin Lvl <2.5 (L) 10.0 - 20.0 ug/mL   Ct Head Wo Contrast 12/16/2014   CLINICAL DATA:  Patient found down.  Intoxication.  EXAM: CT HEAD WITHOUT CONTRAST  TECHNIQUE: Contiguous axial images were obtained from the base of the skull through the vertex without intravenous contrast.  COMPARISON:  Head CT scan 10/29/2014.  FINDINGS: Mild cortical atrophy is identified and there is some chronic microvascular ischemic change. No evidence of acute abnormality including infarct, hemorrhage, mass lesion, mass effect, midline shift or abnormal extra-axial fluid collection is seen. There is no hydrocephalus or  pneumocephalus. Remote fractures of the medial walls of the orbits are identified. There is no acute fracture.  IMPRESSION: No acute finding.   Electronically Signed   By: Drusilla Kannerhomas  Dalessio M.D.   On: 12/16/2014 18:03    2005:  Pt well known to the ED: long hx of etoh abuse, drug seeking behavior, and (dilantin) medication non-compliance. Pt does not want detox.  Pt has slept for several hours and is now awake/alert, calling for a ride home. Will dose PO dilantin before d/c. Pt already has rx of same from ED visit 12/06/14.   2025:  Pt has gotten himself dressed and is walking around the ED, gait steady, NAD. Refuses to take any meds. Pt's ride is here. Pt walked out with Security to leave with his ride.   Samuel Jester, DO 12/17/14 1330

## 2014-12-16 NOTE — ED Notes (Signed)
Pt refused medications, pt stated he only wants to leave and only wants to smoke. Pt's ride here, security walked pt out of building, pt left AMA, EDP aware.

## 2014-12-16 NOTE — ED Notes (Signed)
Pt contacted ride, ride will be here in 10-15 minutes

## 2014-12-16 NOTE — ED Notes (Signed)
Pt resting in bed, laying on his side, snoring respirations noted, pt easily aroused to verbal stimuli.

## 2014-12-16 NOTE — ED Notes (Signed)
Per EMS, pt was [facility]. Pt has ETOH on board. Pt has abrasion to right knee

## 2015-01-12 ENCOUNTER — Emergency Department (HOSPITAL_COMMUNITY)
Admission: EM | Admit: 2015-01-12 | Discharge: 2015-01-12 | Discharge status: Against Medical Advice | Payer: MEDICAID | Attending: Emergency Medicine | Admitting: Emergency Medicine

## 2015-01-12 ENCOUNTER — Encounter (HOSPITAL_COMMUNITY): Payer: Self-pay | Admitting: Emergency Medicine

## 2015-01-12 DIAGNOSIS — I10 Essential (primary) hypertension: Secondary | ICD-10-CM | POA: Insufficient documentation

## 2015-01-12 DIAGNOSIS — M79641 Pain in right hand: Secondary | ICD-10-CM | POA: Insufficient documentation

## 2015-01-12 DIAGNOSIS — Z720 Tobacco use: Secondary | ICD-10-CM | POA: Insufficient documentation

## 2015-01-12 DIAGNOSIS — M79642 Pain in left hand: Secondary | ICD-10-CM | POA: Insufficient documentation

## 2015-01-12 NOTE — ED Notes (Signed)
Patient left the Emergency Department waiting room. Told someone that he would see them later and walked outside @ 2055. Has not returned at this time.

## 2015-01-12 NOTE — ED Notes (Signed)
Pt states his hands are cramping "really bad".

## 2015-01-24 ENCOUNTER — Emergency Department (HOSPITAL_COMMUNITY)
Admission: EM | Admit: 2015-01-24 | Discharge: 2015-01-24 | Disposition: A | Discharge status: Home / Self Care | Payer: MEDICAID | Attending: Emergency Medicine | Admitting: Emergency Medicine

## 2015-01-24 ENCOUNTER — Encounter (HOSPITAL_COMMUNITY): Payer: Self-pay | Admitting: *Deleted

## 2015-01-24 DIAGNOSIS — Z9119 Patient's noncompliance with other medical treatment and regimen: Secondary | ICD-10-CM | POA: Insufficient documentation

## 2015-01-24 DIAGNOSIS — Z8659 Personal history of other mental and behavioral disorders: Secondary | ICD-10-CM | POA: Insufficient documentation

## 2015-01-24 DIAGNOSIS — Z9114 Patient's other noncompliance with medication regimen: Secondary | ICD-10-CM

## 2015-01-24 DIAGNOSIS — Z8709 Personal history of other diseases of the respiratory system: Secondary | ICD-10-CM | POA: Insufficient documentation

## 2015-01-24 DIAGNOSIS — Z87442 Personal history of urinary calculi: Secondary | ICD-10-CM | POA: Insufficient documentation

## 2015-01-24 DIAGNOSIS — Z8639 Personal history of other endocrine, nutritional and metabolic disease: Secondary | ICD-10-CM | POA: Insufficient documentation

## 2015-01-24 DIAGNOSIS — I10 Essential (primary) hypertension: Secondary | ICD-10-CM | POA: Insufficient documentation

## 2015-01-24 DIAGNOSIS — Z87828 Personal history of other (healed) physical injury and trauma: Secondary | ICD-10-CM | POA: Insufficient documentation

## 2015-01-24 DIAGNOSIS — G40909 Epilepsy, unspecified, not intractable, without status epilepticus: Secondary | ICD-10-CM | POA: Insufficient documentation

## 2015-01-24 DIAGNOSIS — R569 Unspecified convulsions: Secondary | ICD-10-CM

## 2015-01-24 DIAGNOSIS — Z8719 Personal history of other diseases of the digestive system: Secondary | ICD-10-CM | POA: Insufficient documentation

## 2015-01-24 DIAGNOSIS — Z79899 Other long term (current) drug therapy: Secondary | ICD-10-CM | POA: Insufficient documentation

## 2015-01-24 LAB — CBC
HCT: 33.2 % — ABNORMAL LOW (ref 39.0–52.0)
Hemoglobin: 11.3 g/dL — ABNORMAL LOW (ref 13.0–17.0)
MCH: 30.5 pg (ref 26.0–34.0)
MCHC: 34 g/dL (ref 30.0–36.0)
MCV: 89.7 fL (ref 78.0–100.0)
PLATELETS: 126 10*3/uL — AB (ref 150–400)
RBC: 3.7 MIL/uL — ABNORMAL LOW (ref 4.22–5.81)
RDW: 20.3 % — ABNORMAL HIGH (ref 11.5–15.5)
WBC: 5.2 10*3/uL (ref 4.0–10.5)

## 2015-01-24 LAB — BASIC METABOLIC PANEL
Anion gap: 15 (ref 5–15)
BUN: 10 mg/dL (ref 6–23)
CO2: 17 mmol/L — AB (ref 19–32)
Calcium: 8 mg/dL — ABNORMAL LOW (ref 8.4–10.5)
Chloride: 99 mmol/L (ref 96–112)
Creatinine, Ser: 1.22 mg/dL (ref 0.50–1.35)
GFR calc Af Amer: 79 mL/min — ABNORMAL LOW (ref >90)
GFR, EST NON AFRICAN AMERICAN: 69 mL/min — AB (ref >90)
Glucose, Bld: 286 mg/dL — ABNORMAL HIGH (ref 70–99)
Potassium: 3.4 mmol/L — ABNORMAL LOW (ref 3.5–5.1)
SODIUM: 131 mmol/L — AB (ref 135–145)

## 2015-01-24 LAB — PHENYTOIN LEVEL, TOTAL

## 2015-01-24 LAB — ETHANOL

## 2015-01-24 MED ORDER — LORAZEPAM 1 MG PO TABS
1.0000 mg | ORAL_TABLET | Freq: Once | ORAL | Status: AC
  Administered 2015-01-24: 1 mg via ORAL
  Filled 2015-01-24: qty 1

## 2015-01-24 MED ORDER — POTASSIUM CHLORIDE CRYS ER 20 MEQ PO TBCR
40.0000 meq | EXTENDED_RELEASE_TABLET | Freq: Once | ORAL | Status: AC
  Administered 2015-01-24: 40 meq via ORAL
  Filled 2015-01-24: qty 2

## 2015-01-24 MED ORDER — SODIUM CHLORIDE 0.9 % IV SOLN
1000.0000 mg | Freq: Once | INTRAVENOUS | Status: AC
  Administered 2015-01-24: 1000 mg via INTRAVENOUS
  Filled 2015-01-24: qty 20

## 2015-01-24 MED ORDER — SODIUM CHLORIDE 0.9 % IV BOLUS (SEPSIS)
500.0000 mL | Freq: Once | INTRAVENOUS | Status: AC
  Administered 2015-01-24: 500 mL via INTRAVENOUS

## 2015-01-24 MED ORDER — PHENYTOIN SODIUM EXTENDED 100 MG PO CAPS: 100.0000 mg | ORAL_CAPSULE | Freq: Three times a day (TID) | ORAL | Status: DC

## 2015-01-24 NOTE — ED Notes (Signed)
Per EMS pt comes from [facility] were he had a seizure. Pt was assisted to the floor and EMS states he did not hit his head. Pt was postictal on arrival. Pt is now alert and oriented. NAD noted.

## 2015-01-24 NOTE — ED Notes (Signed)
Pt states he has history of seizures and was taking medicine. Pt states he hasn't taken his medication in about a month. Pt is a poor historian.

## 2015-01-24 NOTE — ED Provider Notes (Signed)
CSN: [id number]     Arrival date & time 01/24/15  1150 History   First MD Initiated Contact with Patient 01/24/15 1259     Chief Complaint  Patient presents with  . Seizures      HPI Pt was seen at 1300. Per EMS and pt report, c/o sudden onset and resolution of one episode of "seizure" that occurred PTA. Pt was at a soup kitchen when he was noted by staff to have a seizure. Pt was assisted to the floor by bystanders at the scene and did not fall or hit his head. EMS states pt was post-ictal on their arrival to the scene, but has progressively become awake/alert while en route to the ED. Pt feels he is at his baseline on arrival to the ED. Pt endorses he has not taken his seizure medicines "in a while." Pt denies any other complaints. Pt well known to this ED for seizures due to medication non-compliance, as well as etoh abuse. The symptoms have been associated with no other complaints. The patient has a significant history of similar symptoms previously, recently being evaluated for this complaint and multiple prior evals for same.       Past Medical History  Diagnosis Date  . Pancreatitis, acute     First episode earlier in 2012, hospitalized again in 02/2012 and 09/2012  . Hypertension     noncompliance  . Hyperlipidemia     noncompliance  . Lung collapse     h/o  . ETOH abuse   . Cocaine abuse   . DTs (delirium tremens)     history of  . Tobacco abuse   . Seizure disorder 07/17/2013  . Noncompliance 07/17/2013  . Traumatic subdural hematoma 06/15/2013  . 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 Open skull fracture 06/15/2013  . Polysubstance abuse     etoh, cocaine  . Nephrolithiasis   . Seizures   . Drug-seeking behavior    Past Surgical History  Procedure Laterality Date  . Mandible fracture surgery  2006  . Left axillary      surgery for deep laceration   Family History  Problem Relation Age of Onset  . Diabetes type II Mother   . Liver disease Neg Hx   .  Colon cancer Neg Hx   . GI problems Neg Hx    History  Substance Use Topics  . Smoking status: Current Every Day Smoker -- 1.00 packs/day for 20 years    Types: Cigarettes  . Smokeless tobacco: Never Used  . Alcohol Use: Yes    Review of Systems ROS: Statement: All systems negative except as marked or noted in the HPI; Constitutional: Negative for fever and chills. ; ; Eyes: Negative for eye pain, redness and discharge. ; ; ENMT: Negative for ear pain, hoarseness, nasal congestion, sinus pressure and sore throat. ; ; Cardiovascular: Negative for chest pain, palpitations, diaphoresis, dyspnea and peripheral edema. ; ; Respiratory: Negative for cough, wheezing and stridor. ; ; Gastrointestinal: Negative for nausea, vomiting, diarrhea, abdominal pain, blood in stool, hematemesis, jaundice and rectal bleeding. . ; ; Genitourinary: Negative for dysuria, flank pain and hematuria. ; ; Musculoskeletal: Negative for back pain and neck pain. Negative for swelling and trauma.; ; Skin: Negative for pruritus, rash, abrasions, blisters, bruising and skin lesion.; ; Neuro: +seizure. Negative for headache, lightheadedness and neck stiffness. Negative for weakness, extremity weakness, paresthesias.   Allergies  Review of patient's allergies indicates no known allergies.  Home Medications   Prior to  Admission medications   Medication Sig Start Date End Date Taking? Authorizing Provider  phenytoin (DILANTIN) 100 MG ER capsule Take 3 capsules (300 mg total) by mouth at bedtime. Patient not taking: Reported on 01/24/2015 12/06/14   Gerhard Munch, MD  phenytoin (DILANTIN) 100 MG ER capsule Take 1 capsule (100 mg total) by mouth 3 (three) times daily. 12/06/14   Vida Roller, MD   BP 148/115 mmHg  Pulse 112  Temp(Src) 98.6 F (37 C) (Oral)  Resp 18  Ht 6' (1.829 m)  Wt 138 lb (62.596 kg)  BMI 18.71 kg/m2  SpO2 100% Physical Exam  1305: Physical examination:  Nursing notes reviewed; Vital signs and O2  SAT reviewed;  Constitutional: Well developed, Well nourished, Well hydrated, In no acute distress; Head:  Normocephalic, atraumatic; Eyes: EOMI, PERRL, No scleral icterus; ENMT: Mouth and pharynx normal, Mucous membranes moist; Neck: Supple, Full range of motion, No lymphadenopathy; Cardiovascular: Tachycardic.  rate and rhythm, No gallop; Respiratory: Breath sounds clear & equal bilaterally, No rales, rhonchi, wheezes.  Speaking full sentences with ease, Normal respiratory effort/excursion; Chest: Nontender, Movement normal; Abdomen: Soft, Nontender, Nondistended, Normal bowel sounds; Genitourinary: No CVA tenderness; Extremities: Pulses normal, No tenderness, No edema, No calf edema or asymmetry.; Neuro: AA&Ox3, Major CN grossly intact. No facial droop. Speech clear. Pt talking on the phone on my arrival to the ED exam room and during my entire H&P. No gross focal motor or sensory deficits in extremities.; Skin: Color normal, Warm, Dry.    ED Course  Procedures     EKG Interpretation   Date/Time:  Tuesday January 24 2015 11:59:49 EST Ventricular Rate:  111 PR Interval:  135 QRS Duration: 103 QT Interval:  370 QTC Calculation: 503 R Axis:   57 Text Interpretation:  Sinus tachycardia Prolonged QT interval Baseline  wander Artifact When compared with ECG of 12/06/2014 No significant change  was found Confirmed by [facility]  MD, Nicholos Johns [phone]) on 01/24/2015 2:38:17  PM      MDM  MDM Reviewed: previous chart, nursing note and vitals Reviewed previous: labs and ECG Interpretation: labs and ECG   Results for orders placed or performed during the hospital encounter of 01/24/15  Basic metabolic panel (if new onset seizures)  Result Value Ref Range   Sodium 131 (L) 135 - 145 mmol/L   Potassium 3.4 (L) 3.5 - 5.1 mmol/L   Chloride 99 96 - 112 mmol/L   CO2 17 (L) 19 - 32 mmol/L   Glucose, Bld 286 (H) 70 - 99 mg/dL   BUN 10 6 - 23 mg/dL   Creatinine, Ser 6.04 0.50 - 1.35 mg/dL   Calcium  8.0 (L) 8.4 - 10.5 mg/dL   GFR calc non Af Amer 69 (L) >90 mL/min   GFR calc Af Amer 79 (L) >90 mL/min   Anion gap 15 5 - 15  CBC (if new onset seizures)  Result Value Ref Range   WBC 5.2 4.0 - 10.5 K/uL   RBC 3.70 (L) 4.22 - 5.81 MIL/uL   Hemoglobin 11.3 (L) 13.0 - 17.0 g/dL   HCT 54.0 (L) 98.1 - 19.1 %   MCV 89.7 78.0 - 100.0 fL   MCH 30.5 26.0 - 34.0 pg   MCHC 34.0 30.0 - 36.0 g/dL   RDW 47.8 (H) 29.5 - 62.1 %   Platelets 126 (L) 150 - 400 K/uL  Dilantin (phenytoin) Level   (if pt is taking this medication)  Result Value Ref Range   Phenytoin Lvl <  2.5 (L) 10.0 - 20.0 ug/mL  Ethanol  Result Value Ref Range   Alcohol, Ethyl (B) <5 0 - 9 mg/dL    [id number]:  Pt has tol PO well without N/V. No seizure while in the ED. IV dilantin load given after PO ativan. Potassium repleted PO. VS per pt's baseline per EPIC chart review. Pt wants to go home now. Dx and testing d/w pt.  Questions answered.  Verb understanding, agreeable to d/c home with outpt f/u.        Samuel JesterKathleen Nijah Orlich, DO 01/28/15 [id number]

## 2015-01-24 NOTE — ED Notes (Signed)
MD at bedside. 

## 2015-01-24 NOTE — Discharge Instructions (Signed)
°Emergency Department Resource Guide °1) Find a Doctor and Pay Out of Pocket °Although you won't have to find out who is covered by your insurance plan, it is a good idea to ask around and get recommendations. You will then need to call the office and see if the doctor you have chosen will accept you as a new patient and what types of options they offer for patients who are self-pay. Some doctors offer discounts or will set up payment plans for their patients who do not have insurance, but you will need to ask so you aren't surprised when you get to your appointment. ° °2) Contact Your Local Health Department °Not all health departments have doctors that can see patients for sick visits, but many do, so it is worth a call to see if yours does. If you don't know where your local health department is, you can check in your phone book. The CDC also has a tool to help you locate your state's health department, and many state websites also have listings of all of their local health departments. ° °3) Find a Walk-in Clinic °If your illness is not likely to be very severe or complicated, you may want to try a walk in clinic. These are popping up all over the country in pharmacies, drugstores, and shopping centers. They're usually staffed by nurse practitioners or physician assistants that have been trained to treat common illnesses and complaints. They're usually fairly quick and inexpensive. However, if you have serious medical issues or chronic medical problems, these are probably not your best option. ° °No Primary Care Doctor: °- Call Health Connect at  832-8000 - they can help you locate a primary care doctor that  accepts your insurance, provides certain services, etc. °- Physician Referral Service- 1-800-533-3463 ° °Chronic Pain Problems: °Organization         Address  Phone   Notes  °[facility]  ([phone]) [phone] Patients need to be referred by their primary care doctor.  ° °Medication  Assistance: °Organization         Address  Phone   Notes  °[place] [facility] [address] °[place], [place] [address] (336) 641-8030 --Must be a resident of [place] °-- Must have NO insurance coverage whatsoever (no Medicaid/ Medicare, etc.) °-- The pt. MUST have a primary care doctor that directs their care regularly and follows them in the community °  °[facility]  (866) 331-1348   °[employer]  (888) 892-1162   ° °Agencies that provide inexpensive medical care: °Organization         Address  Phone   Notes  °[facility]  (336) 832-8035   °[facility]    (336) 832-7272   °[facility] [address] °[place], [place] [address] (336) 832-4777   °[facility] [address], °[place] (336) 271-4999   °[facility]    (336) 373-0678   °[facility]    (336) 272-1050   °[facility] ° [address], [place] Phone:  (336) 832-4444, Fax:  (336) 832-4440 Hours of Operation:  9 am - 6 pm, M-F.  Also accepts Medicaid/Medicare and self-pay.  °[facility] ° [address], [place] Phone: (336) [phone], Fax: (336) [phone]. Hours of Operation:  8:30 am - 5:30 pm, M-F.  Also accepts Medicaid and self-pay.  °[facility] [place] [address], [place] Phone: (336) 878-6027   °[facility] [address], [place], [place] (336)723-1848, Ext. 123 Mondays & Thursdays: 7-9 AM.  First 15 patients are seen on a first come, first serve basis. °  ° °Medicaid-accepting [place] Providers: ° °Organization         Address  Phone   Notes  °[facility] [address], [place] (336) 641-2100 Also accepts self-pay patients.  °[facility] [address], [place] ° (336) 856-9996   °[facility] [address], [place]  (336) 288-8857   °[facility] [address] [place] [address], [place] (336) 299-7000   °Veita Bland [address], [place]  ° (336) 373-1557 Only accepts [place] Access Medicaid patients after they have their name applied to their card.  ° °Self-Pay (no insurance) in [place]: ° °Organization         Address  Phone   Notes  °Sickle Cell Patients, [facility] [address], [place] (336) 832-1970   °[facility] [address], [place] (336) 832-4400   °[facility] ° [address], [place] (336) 992-4800   °[facility]/Dr. Osei-Bonsu ° [address] [place] [address], [place] or [address], [place] (336) [phone] Phone number for both [place] and [place] locations is the same.  °[facility] [address], [place] (336) 299-0000   °[facility] [address] [place] [address], [place] or [address] (336) 852-7530 °(336) 878-2260   °[facility] [address], [place] (336) 350-1642, phone; (336) 294-5005, fax Sees patients 1st and 3rd Saturday of every month.  Must not qualify for public or private insurance (i.e. Medicaid, Medicare, [place] Health Choice, Veterans' Benefits) • Household income should be no more than 200% of the poverty level •The clinic cannot treat you if you are pregnant or think you are pregnant • Sexually transmitted diseases are not treated at the clinic.  ° ° °Dental Care: °Organization         Address  Phone  Notes  °[place] [facility] [address], [place] (336) 641-6152 Accepts children up to age 21 who are enrolled in Medicaid or [place] Health Choice; pregnant women with a Medicaid card; and children who have applied for Medicaid or [place] Health Choice, but were declined, whose parents can pay a reduced fee at time of service.  °[place]  [facility] [place]  [address], [place] (336) 641-7733 Accepts children up to age 21 who are enrolled in Medicaid or [place] Health Choice; pregnant women with a Medicaid card; and children who have applied for Medicaid or [place] Health Choice, but were declined, whose parents can pay a reduced fee at time of service.  °[facility] ° [address], [place] (336) 641-4533 Patients are seen by appointment only. Walk-ins are not accepted. [facility] will see patients 18 years of age and older. °Monday - Tuesday (8am-5pm) °Most Wednesdays (8:30-5pm) °$30 per visit, cash only  °[facility] ° [address], [place] (336) 641-4533 Patients are seen by appointment only. Walk-ins are not accepted. [facility] will see patients 18 years of age and older. °One   Wednesday Evening (Monthly: Volunteer Based).  $30 per visit, cash only  °[facility]  (919) 537-3737 for adults; Children under age 4, call Graduate Pediatric Dentistry at ([phone]) [phone]. Children aged 4-14, please call ([phone]) [phone] to request a pediatric application. ° Dental services are provided in all areas of dental care including fillings, crowns and bridges, complete and partial dentures, implants, gum treatment, root canals, and extractions. Preventive care is also provided. Treatment is provided to both adults and children. °Patients are selected via a lottery and there is often a waiting list. °  °[facility] [address], °[place] ° (336) 763-8833 [email or web address] °  °[facility] [address], [place], [place] (336)723-1848, Ext. 123 Second and Fourth Thursday of each month, opens at 6:30 AM; Clinic ends at 9 AM.  Patients are seen on a first-come first-served basis, and a limited number are seen during each clinic.  ° °[facility] ° [address], [place], [place] (336) 723-7904    Eligibility Requirements °You must have lived in [place], [place], or [place] counties for at least the last three months. °  You cannot be eligible for state or federal sponsored healthcare insurance, including Veterans Administration, Medicaid, or Medicare. °  You generally cannot be eligible for healthcare insurance through your employer.  °  How to apply: °Eligibility screenings are held every Tuesday and Wednesday afternoon from 1:00 pm until 4:00 pm. You do not need an appointment for the interview!  °[facility] [address], [place], [place] 336-631-2330   °[facility]  336-342-8273   °[facility]  336-703-3100   °[facility]  336-570-6415   ° °Behavioral Health Resources in the Community: °Intensive Outpatient Programs °Organization         Address  Phone  Notes  °[facility] [address], [place], [place] 336-878-6098   °[facility] [address], [place], [place] 336-832-9800   °[facility] [address], [place], [place] ° 336-882-2125   °[facility] [address],  °[place], [place] 1-800-853-5163 or 336-641-4981   °Substance Abuse Resources °Organization         Address  Phone  Notes  °[facility]  336-882-2125   °[facility]  336-784-9470   °[facility]  336-285-9073   °[facility]  336-845-3988   °Residential & Outpatient Substance Abuse Program  1-800-659-3381   °Psychological Services °Organization         Address  Phone  Notes  °[facility]  336- 832-9600   °[facility]  336- 378-7881   °[facility] [address], [place] 1-800-853-5163 or 336-641-4981   ° °Mobile Crisis Teams °Organization         Address  Phone  Notes  °[facility], Mobile Crisis Care Unit  1-877-626-1772   °Assertive °[facility] ° [address]  [place], [place] 336-834-9664   °Sharon DeEsch [address] °[place] 336-554-5454   ° °Self-Help/Support Groups °Organization         Address  Phone             Notes  °[facility]  - variety of support groups  336- 373-1402 Call for more information  °Narcotics Anonymous (NA), [facility] [address], °[place]  2 meetings at this location  ° °  Residential Treatment Programs Organization         Address  Phone  Notes  [facility] [address],    [place]  [phone]   [facility]  [address], [place] [phone], [place], [place] [phone]   [facility] [address], [place] [phone] Admissions: 8am-3pm M-F  [facility] [address]    [place], [place] [phone]   [facility] [address], [place], [place] [phone]   [facility] [address]  [place], [place] [phone]   [facility] Intensive Outpatient [address], [place], [place] [phone]   [facility] ([facility] [address]  [place], [place] [phone] or [phone]   [facility] [address] [place], [place] [phone] Accepts Medicaid  [facility] [address]  [place] [phone] Substance Abuse/Addiction Treatment   [facility] Organization         Address  Phone  Notes  [facility]  407-521-9296(888) [phone]   Angie FavaJulie Brannon, PhD [address], [address] [place], [place]   ([phone]) [phone](336) [phone] or [phone](336) [phone]   [facility]   [address] [place], [place] [phone](336) [phone]   [facility] [address], [place], [place] [phone](336) [phone] Insurance/Medicaid/sponsorship through [facility] [address]                                    [place], [place] [phone](336) [phone] Therapy/tele-psych/case    [facility] [address]   [place], [place] [phone](336) [phone]    Dr. Lolly MustacheArfeen  [phone](336) [phone]   [facility] [place] [facility]  [employer] [facility] 1) [address], [place] 2) [address], [place] 3)  [address], [place] [phone](336) [phone](336) [phone]  [phone](336) [phone]   [facility] Child Abuse Hotline 5101938492(336) (519) [phone] or [phone](336) [phone] (After Hours)      Take the prescription as directed.  Call your regular medical doctor today to schedule a follow up appointment within the next week.  Return to the Emergency Department immediately sooner if worsening.

## 2015-02-10 IMAGING — CT CT HEAD W/O CM
1 of 2 series · 16 of 30 positions shown, 20 images · non-contrast
Comparison: Head CT scan 10/29/2014.

CLINICAL DATA: Patient found down.  Intoxication.

EXAM:
CT HEAD WITHOUT CONTRAST
TECHNIQUE: Contiguous axial images were obtained from the base of the skull
through the vertex without intravenous contrast.

[Series 2: headtrauma 4.8 h37s · axial · 0.45mm/px · z∈[+73,+230]mm · 16 of 36 slices shown, 20 images]
[im 2/36  brain]
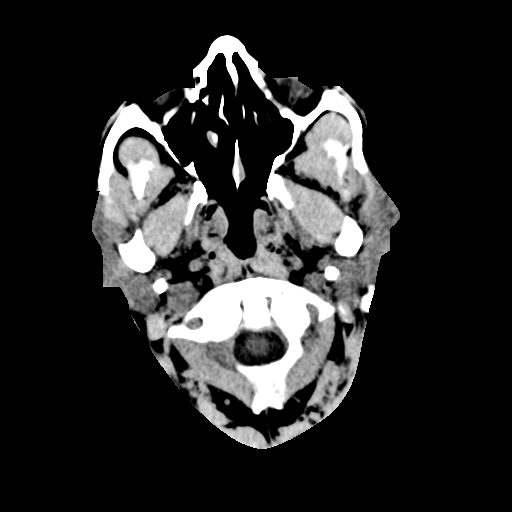
[im 2/36  bone]
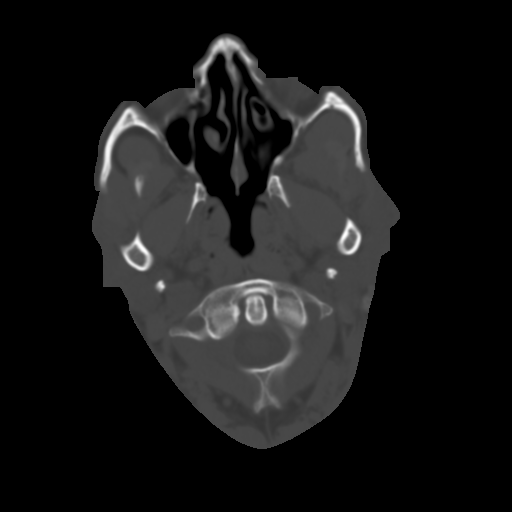
[im 4/36  brain]
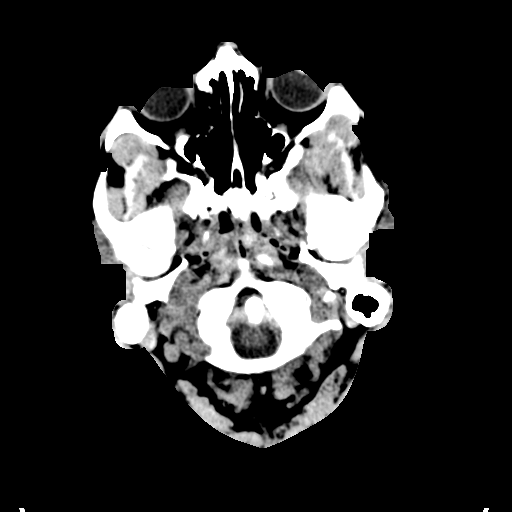
[im 7/36  brain]
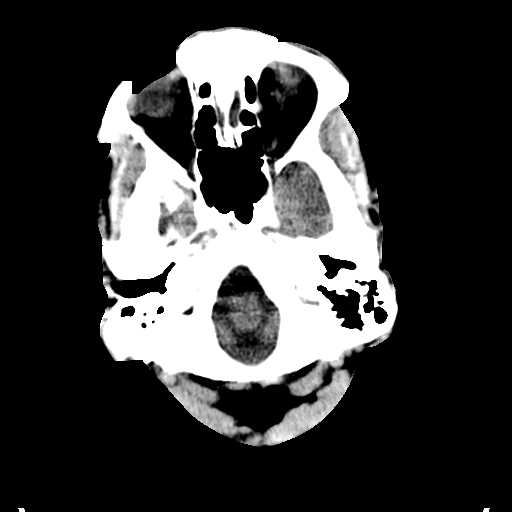
[im 9/36  brain]
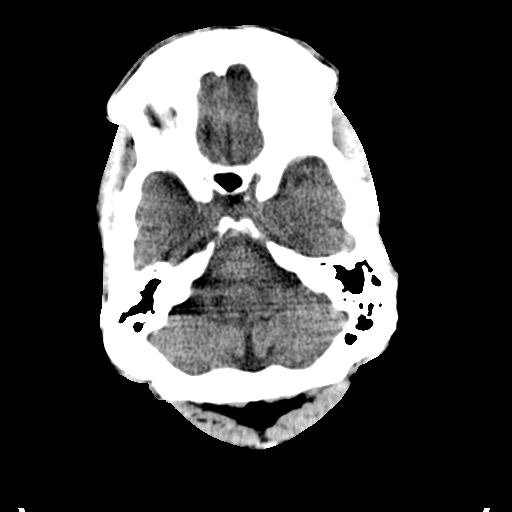
[im 11/36  brain]
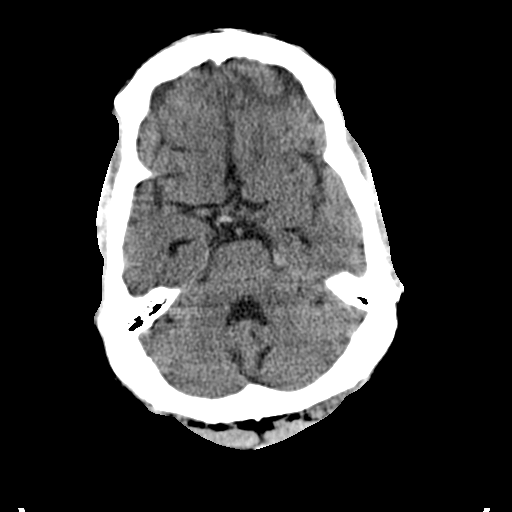
[im 11/36  bone]
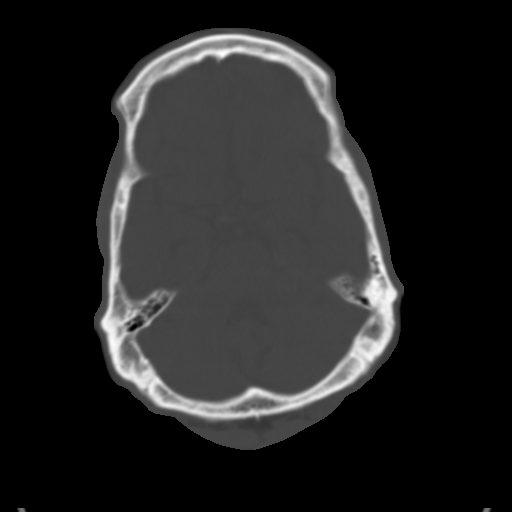
[im 12/36  brain]
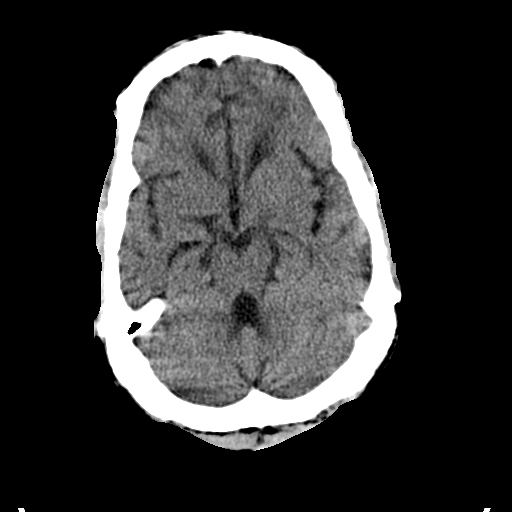
[im 16/36  brain]
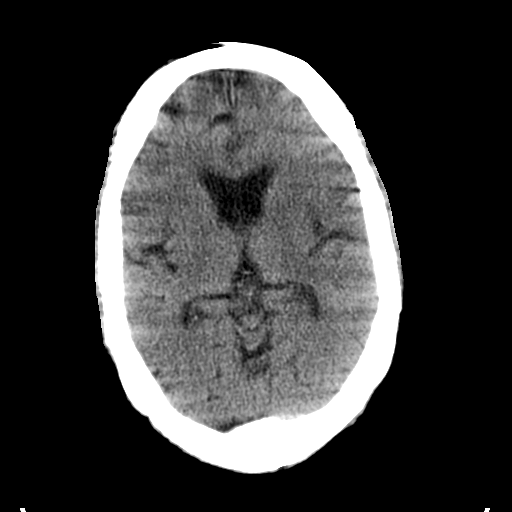
[im 17/36  brain]
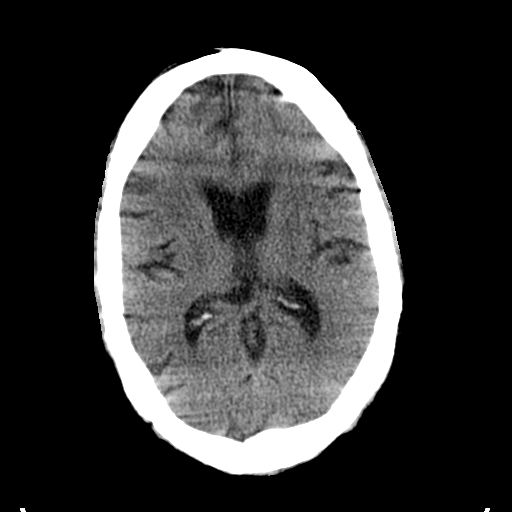
[im 19/36  brain]
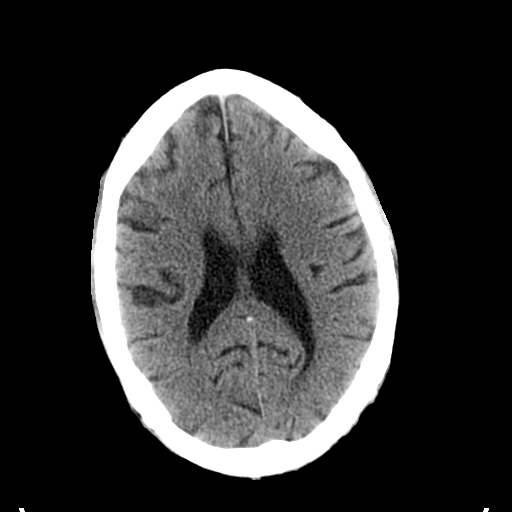
[im 19/36  bone]
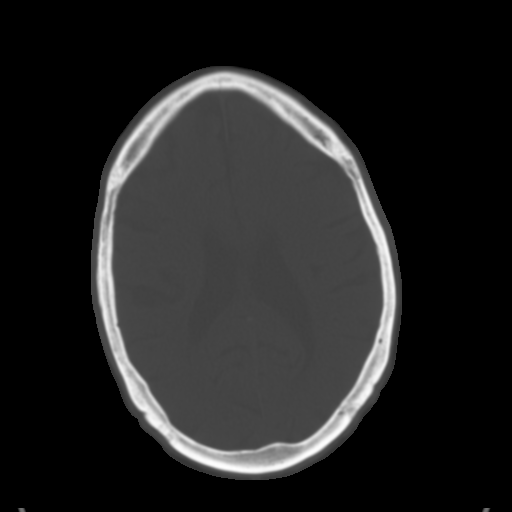
[im 21/36  brain]
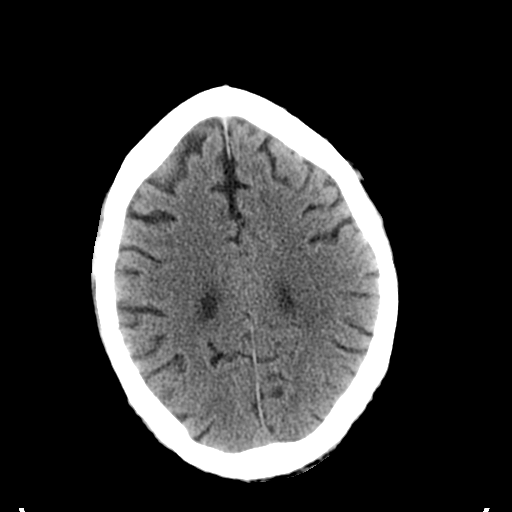
[im 24/36  brain]
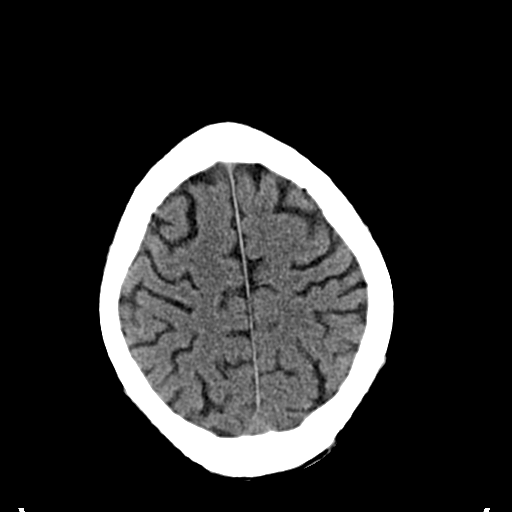
[im 26/36  brain]
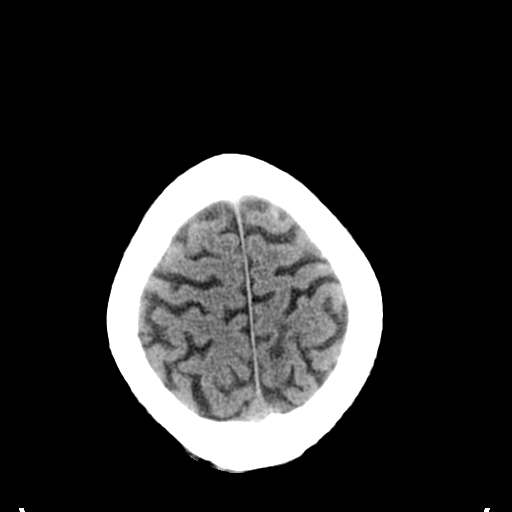
[im 27/36  brain]
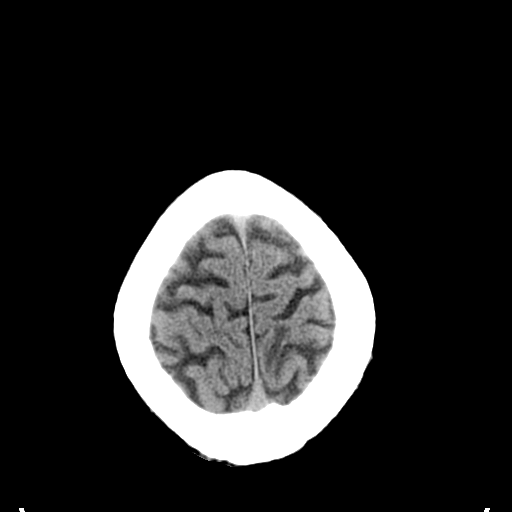
[im 27/36  bone]
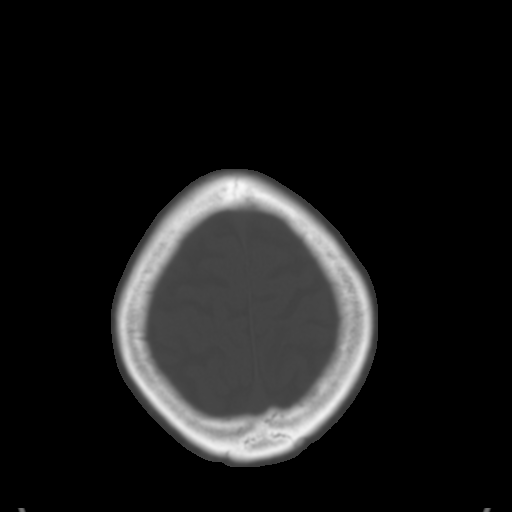
[im 29/36  brain]
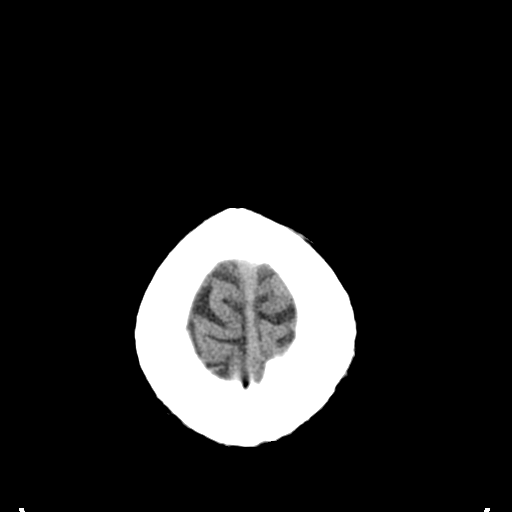
[im 32/36  brain]
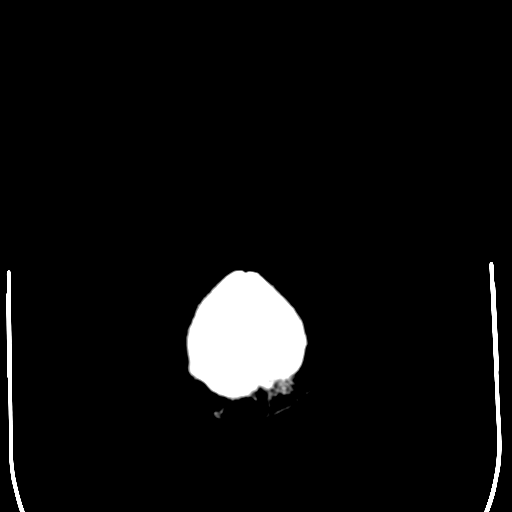
[im 34/36  brain]
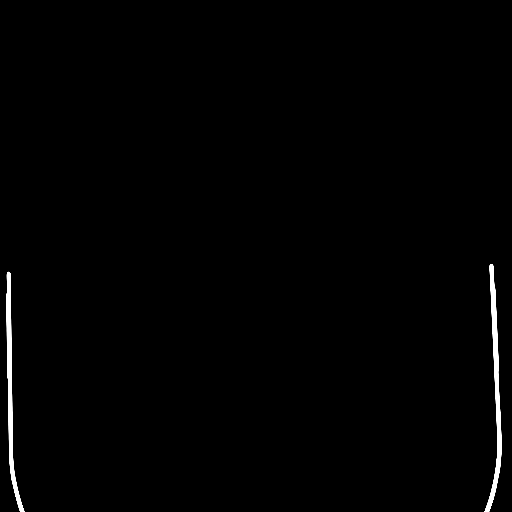

[16 of 30 positions shown; findings below may reference images not displayed]

FINDINGS: Mild cortical atrophy is identified and there is some chronic
microvascular ischemic change. No evidence of acute abnormality
including infarct, hemorrhage, mass lesion, mass effect, midline
shift or abnormal extra-axial fluid collection is seen. There is no
hydrocephalus or pneumocephalus. Remote fractures of the medial
walls of the orbits are identified. There is no acute fracture.
IMPRESSION: No acute finding.

## 2015-02-17 ENCOUNTER — Emergency Department (HOSPITAL_COMMUNITY): Payer: Self-pay

## 2015-02-17 ENCOUNTER — Encounter (HOSPITAL_COMMUNITY): Payer: Self-pay | Admitting: *Deleted

## 2015-02-17 ENCOUNTER — Inpatient Hospital Stay (HOSPITAL_COMMUNITY)
Admission: EM | Admit: 2015-02-17 | Discharge: 2015-02-21 | DRG: 100 | Disposition: A | Discharge status: Home Health | Payer: Self-pay | Attending: Family Medicine | Admitting: Family Medicine

## 2015-02-17 DIAGNOSIS — R569 Unspecified convulsions: Principal | ICD-10-CM | POA: Diagnosis present

## 2015-02-17 DIAGNOSIS — R4020 Unspecified coma: Secondary | ICD-10-CM

## 2015-02-17 DIAGNOSIS — Z720 Tobacco use: Secondary | ICD-10-CM | POA: Diagnosis present

## 2015-02-17 DIAGNOSIS — K860 Alcohol-induced chronic pancreatitis: Secondary | ICD-10-CM

## 2015-02-17 DIAGNOSIS — Z9114 Patient's other noncompliance with medication regimen: Secondary | ICD-10-CM | POA: Diagnosis present

## 2015-02-17 DIAGNOSIS — F17210 Nicotine dependence, cigarettes, uncomplicated: Secondary | ICD-10-CM | POA: Diagnosis present

## 2015-02-17 DIAGNOSIS — K709 Alcoholic liver disease, unspecified: Secondary | ICD-10-CM | POA: Diagnosis present

## 2015-02-17 DIAGNOSIS — Z91199 Patient's noncompliance with other medical treatment and regimen due to unspecified reason: Secondary | ICD-10-CM

## 2015-02-17 DIAGNOSIS — Z9119 Patient's noncompliance with other medical treatment and regimen: Secondary | ICD-10-CM

## 2015-02-17 DIAGNOSIS — F10929 Alcohol use, unspecified with intoxication, unspecified: Secondary | ICD-10-CM

## 2015-02-17 DIAGNOSIS — R7309 Other abnormal glucose: Secondary | ICD-10-CM

## 2015-02-17 DIAGNOSIS — N179 Acute kidney failure, unspecified: Secondary | ICD-10-CM | POA: Diagnosis present

## 2015-02-17 DIAGNOSIS — F10239 Alcohol dependence with withdrawal, unspecified: Secondary | ICD-10-CM | POA: Diagnosis present

## 2015-02-17 DIAGNOSIS — K2920 Alcoholic gastritis without bleeding: Secondary | ICD-10-CM | POA: Diagnosis present

## 2015-02-17 DIAGNOSIS — K7210 Chronic hepatic failure without coma: Secondary | ICD-10-CM | POA: Diagnosis present

## 2015-02-17 DIAGNOSIS — F1410 Cocaine abuse, uncomplicated: Secondary | ICD-10-CM | POA: Diagnosis present

## 2015-02-17 DIAGNOSIS — E872 Acidosis: Secondary | ICD-10-CM | POA: Diagnosis present

## 2015-02-17 DIAGNOSIS — Z833 Family history of diabetes mellitus: Secondary | ICD-10-CM

## 2015-02-17 DIAGNOSIS — R945 Abnormal results of liver function studies: Secondary | ICD-10-CM

## 2015-02-17 DIAGNOSIS — E1100 Type 2 diabetes mellitus with hyperosmolarity without nonketotic hyperglycemic-hyperosmolar coma (NKHHC): Secondary | ICD-10-CM | POA: Diagnosis present

## 2015-02-17 DIAGNOSIS — G40909 Epilepsy, unspecified, not intractable, without status epilepticus: Secondary | ICD-10-CM

## 2015-02-17 DIAGNOSIS — I10 Essential (primary) hypertension: Secondary | ICD-10-CM | POA: Diagnosis present

## 2015-02-17 DIAGNOSIS — E785 Hyperlipidemia, unspecified: Secondary | ICD-10-CM | POA: Diagnosis present

## 2015-02-17 DIAGNOSIS — F1010 Alcohol abuse, uncomplicated: Secondary | ICD-10-CM | POA: Diagnosis present

## 2015-02-17 DIAGNOSIS — K861 Other chronic pancreatitis: Secondary | ICD-10-CM | POA: Diagnosis present

## 2015-02-17 DIAGNOSIS — R748 Abnormal levels of other serum enzymes: Secondary | ICD-10-CM | POA: Diagnosis present

## 2015-02-17 LAB — CBC WITH DIFFERENTIAL/PLATELET
BASOS ABS: 0 10*3/uL (ref 0.0–0.1)
BASOS PCT: 0 % (ref 0–1)
EOS ABS: 0 10*3/uL (ref 0.0–0.7)
Eosinophils Relative: 0 % (ref 0–5)
HCT: 38.9 % — ABNORMAL LOW (ref 39.0–52.0)
HEMOGLOBIN: 11.6 g/dL — AB (ref 13.0–17.0)
Lymphocytes Relative: 11 % — ABNORMAL LOW (ref 12–46)
Lymphs Abs: 1.2 10*3/uL (ref 0.7–4.0)
MCH: 32.9 pg (ref 26.0–34.0)
MCHC: 29.8 g/dL — ABNORMAL LOW (ref 30.0–36.0)
MCV: 110.2 fL — ABNORMAL HIGH (ref 78.0–100.0)
MONO ABS: 0.5 10*3/uL (ref 0.1–1.0)
MONOS PCT: 5 % (ref 3–12)
Neutro Abs: 9.7 10*3/uL — ABNORMAL HIGH (ref 1.7–7.7)
Neutrophils Relative %: 85 % — ABNORMAL HIGH (ref 43–77)
Platelets: 116 10*3/uL — ABNORMAL LOW (ref 150–400)
RBC: 3.53 MIL/uL — ABNORMAL LOW (ref 4.22–5.81)
RDW: 17 % — AB (ref 11.5–15.5)
WBC: 11.5 10*3/uL — ABNORMAL HIGH (ref 4.0–10.5)

## 2015-02-17 LAB — COMPREHENSIVE METABOLIC PANEL
ALBUMIN: 2.7 g/dL — AB (ref 3.5–5.2)
ALK PHOS: 515 U/L — AB (ref 39–117)
ALT: 108 U/L — ABNORMAL HIGH (ref 0–53)
AST: 156 U/L — ABNORMAL HIGH (ref 0–37)
Anion gap: 30 — ABNORMAL HIGH (ref 5–15)
BUN: 9 mg/dL (ref 6–23)
CO2: 21 mmol/L (ref 19–32)
Calcium: 8.9 mg/dL (ref 8.4–10.5)
Chloride: 83 mmol/L — ABNORMAL LOW (ref 96–112)
Creatinine, Ser: 2.13 mg/dL — ABNORMAL HIGH (ref 0.50–1.35)
GFR calc Af Amer: 41 mL/min — ABNORMAL LOW (ref >90)
GFR calc non Af Amer: 35 mL/min — ABNORMAL LOW (ref >90)
Glucose, Bld: 1239 mg/dL (ref 70–99)
POTASSIUM: 3.1 mmol/L — AB (ref 3.5–5.1)
Sodium: 134 mmol/L — ABNORMAL LOW (ref 135–145)
Total Bilirubin: 9.8 mg/dL — ABNORMAL HIGH (ref 0.3–1.2)
Total Protein: 6.8 g/dL (ref 6.0–8.3)

## 2015-02-17 LAB — PROTIME-INR
INR: 1.49 (ref 0.00–1.49)
PROTHROMBIN TIME: 18.2 s — AB (ref 11.6–15.2)

## 2015-02-17 LAB — CBG MONITORING, ED: Glucose-Capillary: >600 mg/dL (ref 70–99)

## 2015-02-17 LAB — ETHANOL: Alcohol, Ethyl (B): 5 mg/dL (ref 0–9)

## 2015-02-17 LAB — PHENYTOIN LEVEL, TOTAL: Phenytoin Lvl: <2.5 ug/mL — ABNORMAL LOW (ref 10.0–20.0)

## 2015-02-17 MED ORDER — CETYLPYRIDINIUM CHLORIDE 0.05 % MT LIQD
7.0000 mL | Freq: Two times a day (BID) | OROMUCOSAL | Status: DC
Start: 2015-02-17 — End: 2015-02-21
  Administered 2015-02-17 – 2015-02-21 (×6): 7 mL via OROMUCOSAL

## 2015-02-17 MED ORDER — ONDANSETRON HCL 4 MG PO TABS: 4.0000 mg | ORAL_TABLET | Freq: Four times a day (QID) | ORAL | Status: DC | PRN

## 2015-02-17 MED ORDER — PHENYTOIN SODIUM EXTENDED 100 MG PO CAPS
100.0000 mg | ORAL_CAPSULE | Freq: Three times a day (TID) | ORAL | Status: DC
  Administered 2015-02-18 – 2015-02-21 (×12): 100 mg via ORAL
  Filled 2015-02-17 (×12): qty 1

## 2015-02-17 MED ORDER — THIAMINE HCL 100 MG/ML IJ SOLN
Freq: Once | INTRAVENOUS | Status: AC
  Administered 2015-02-18: via INTRAVENOUS
  Filled 2015-02-17: qty 1000

## 2015-02-17 MED ORDER — POTASSIUM CHLORIDE CRYS ER 20 MEQ PO TBCR
40.0000 meq | EXTENDED_RELEASE_TABLET | Freq: Once | ORAL | Status: AC
  Administered 2015-02-17: 40 meq via ORAL
  Filled 2015-02-17: qty 2

## 2015-02-17 MED ORDER — SODIUM CHLORIDE 0.9 % IV SOLN
1000.0000 mg | Freq: Once | INTRAVENOUS | Status: AC
  Administered 2015-02-18: 1000 mg via INTRAVENOUS
  Filled 2015-02-17: qty 20

## 2015-02-17 MED ORDER — INSULIN REGULAR BOLUS VIA INFUSION
0.0000 [IU] | Freq: Three times a day (TID) | INTRAVENOUS | Status: DC | PRN
  Filled 2015-02-17: qty 10

## 2015-02-17 MED ORDER — THIAMINE HCL 100 MG/ML IJ SOLN
INTRAMUSCULAR | Status: AC
  Filled 2015-02-17: qty 2

## 2015-02-17 MED ORDER — DEXTROSE 50 % IV SOLN: 25.0000 mL | INTRAVENOUS | Status: DC | PRN

## 2015-02-17 MED ORDER — INSULIN ASPART 100 UNIT/ML ~~LOC~~ SOLN: 0.0000 [IU] | SUBCUTANEOUS | Status: DC

## 2015-02-17 MED ORDER — SODIUM CHLORIDE 0.9 % IV BOLUS (SEPSIS)
1000.0000 mL | Freq: Once | INTRAVENOUS | Status: AC
  Administered 2015-02-17: 1000 mL via INTRAVENOUS

## 2015-02-17 MED ORDER — SODIUM CHLORIDE 0.9 % IV SOLN
INTRAVENOUS | Status: DC
  Administered 2015-02-17: 23:00:00 via INTRAVENOUS

## 2015-02-17 MED ORDER — SODIUM CHLORIDE 0.9 % IV SOLN
Freq: Once | INTRAVENOUS | Status: AC
  Administered 2015-02-17: 20:00:00 via INTRAVENOUS

## 2015-02-17 MED ORDER — SODIUM CHLORIDE 0.9 % IV SOLN
INTRAVENOUS | Status: DC | PRN
  Filled 2015-02-17: qty 2.5

## 2015-02-17 MED ORDER — SODIUM CHLORIDE 0.9 % IV BOLUS (SEPSIS)
2000.0000 mL | Freq: Once | INTRAVENOUS | Status: AC
  Administered 2015-02-17: 2000 mL via INTRAVENOUS

## 2015-02-17 MED ORDER — HYDROMORPHONE HCL 1 MG/ML IJ SOLN
0.5000 mg | INTRAMUSCULAR | Status: DC | PRN
  Administered 2015-02-18: 0.5 mg via INTRAVENOUS
  Filled 2015-02-17: qty 1

## 2015-02-17 MED ORDER — ONDANSETRON HCL 4 MG/2ML IJ SOLN
4.0000 mg | Freq: Four times a day (QID) | INTRAMUSCULAR | Status: DC | PRN
  Administered 2015-02-17 – 2015-02-18 (×2): 4 mg via INTRAVENOUS
  Filled 2015-02-17: qty 2

## 2015-02-17 MED ORDER — ONDANSETRON HCL 4 MG/2ML IJ SOLN
INTRAMUSCULAR | Status: AC
  Filled 2015-02-17: qty 2

## 2015-02-17 MED ORDER — M.V.I. ADULT IV INJ
INJECTION | INTRAVENOUS | Status: AC
  Filled 2015-02-17: qty 10

## 2015-02-17 MED ORDER — SODIUM CHLORIDE 0.9 % IV SOLN
INTRAVENOUS | Status: DC
  Administered 2015-02-17: 5.4 [IU]/h via INTRAVENOUS
  Filled 2015-02-17: qty 2.5

## 2015-02-17 MED ORDER — INSULIN REGULAR BOLUS VIA INFUSION
0.0000 [IU] | Freq: Three times a day (TID) | INTRAVENOUS | Status: DC
  Filled 2015-02-17: qty 10

## 2015-02-17 MED ORDER — DEXTROSE-NACL 5-0.45 % IV SOLN
INTRAVENOUS | Status: AC
  Administered 2015-02-18: 01:00:00 via INTRAVENOUS

## 2015-02-17 MED ORDER — SODIUM CHLORIDE 0.9 % IJ SOLN
3.0000 mL | Freq: Two times a day (BID) | INTRAMUSCULAR | Status: DC
  Administered 2015-02-17 – 2015-02-20 (×6): 3 mL via INTRAVENOUS

## 2015-02-17 MED ORDER — FOLIC ACID 5 MG/ML IJ SOLN
INTRAMUSCULAR | Status: AC
  Filled 2015-02-17: qty 0.2

## 2015-02-17 NOTE — ED Notes (Signed)
Unable to obtain accurate EKG at this time.

## 2015-02-17 NOTE — ED Notes (Addendum)
Syncopal episode today, "it happens a lot". Brought to Ed by friend.   "I might have had a sz"  Out of sz med.for ?time.

## 2015-02-17 NOTE — ED Notes (Signed)
Unable to obtain temp 

## 2015-02-17 NOTE — ED Provider Notes (Signed)
CSN: [id number]     Arrival date & time 02/17/15  1741 History  This chart was scribed for Paul LennertJoseph L Clydette Privitera, MD by Gwenyth Oberatherine Macek, ED Scribe. This patient was seen in room APA02/APA02 and the patient's care was started at 5:57 PM.    Chief Complaint  Patient presents with  . Loss of Consciousness   Patient is a 49 y.o. male presenting with syncope. The history is provided by the patient and a friend. No language interpreter was used.  Loss of Consciousness Episode history:  Multiple Most recent episode:  Today Timing:  Sporadic Progression:  Unable to specify Witnessed: no   Associated symptoms: seizures   Associated symptoms: no chest pain and no headaches     HPI Comments: Paul [name] is a 49 y.o. male with a history of seizures who presents to the Emergency Department after a seizure that occurred earlier this morning. He notes 2 syncopal episodes and new onset difficulty walking as associated symptoms. Pt has been out of seizure medication for an unspecified amount of time. He currently lives with his best friend who reports that he found him on the floor when he returned from work. Pt was in the ED on 1/26 after seizure.  Past Medical History  Diagnosis Date  . Pancreatitis, acute     First episode earlier in 2012, hospitalized again in 02/2012 and 09/2012  . Hypertension     noncompliance  . Hyperlipidemia     noncompliance  . Lung collapse     h/o  . ETOH abuse   . Cocaine abuse   . DTs (delirium tremens)     history of  . Tobacco abuse   . Seizure disorder 07/17/2013  . Noncompliance 07/17/2013  . Traumatic subdural hematoma 06/15/2013  . 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 Open skull fracture 06/15/2013  . Polysubstance abuse     etoh, cocaine  . Nephrolithiasis   . Seizures   . Drug-seeking behavior    Past Surgical History  Procedure Laterality Date  . Mandible fracture surgery  2006  . Left axillary      surgery for deep laceration   Family  History  Problem Relation Age of Onset  . Diabetes type II Mother   . Liver disease Neg Hx   . Colon cancer Neg Hx   . GI problems Neg Hx    History  Substance Use Topics  . Smoking status: Current Every Day Smoker -- 1.00 packs/day for 20 years    Types: Cigarettes  . Smokeless tobacco: Never Used  . Alcohol Use: Yes    Review of Systems  Constitutional: Negative for appetite change and fatigue.  HENT: Negative for congestion, ear discharge and sinus pressure.   Eyes: Negative for discharge.  Respiratory: Negative for cough.   Cardiovascular: Positive for syncope. Negative for chest pain.  Gastrointestinal: Negative for abdominal pain and diarrhea.  Genitourinary: Negative for frequency and hematuria.  Musculoskeletal: Negative for back pain.  Skin: Negative for rash.  Neurological: Positive for seizures and syncope. Negative for headaches.  Psychiatric/Behavioral: Negative for hallucinations.  All other systems reviewed and are negative.     Allergies  Review of patient's allergies indicates no known allergies.  Home Medications   Prior to Admission medications   Medication Sig Start Date End Date Taking? Authorizing Provider  phenytoin (DILANTIN) 100 MG ER capsule Take 1 capsule (100 mg total) by mouth 3 (three) times daily. 01/24/15   Samuel JesterKathleen McManus, DO   BP  98/75 mmHg  Pulse 117  Resp 18  SpO2 98% Physical Exam  Constitutional: He is oriented to person, place, and time. He appears well-developed.  HENT:  Head: Normocephalic.  Dry mucous membranes  Eyes: Conjunctivae and EOM are normal. No scleral icterus.  Neck: Neck supple. No thyromegaly present.  Cardiovascular: Normal rate and regular rhythm.  Exam reveals no gallop and no friction rub.   No murmur heard. Pulmonary/Chest: No stridor. He has wheezes. He has no rales. He exhibits no tenderness.  Mild wheezing  Abdominal: He exhibits no distension. There is no tenderness. There is no rebound.   Musculoskeletal: Normal range of motion. He exhibits no edema.  Ataxic and unsteady gait  Lymphadenopathy:    He has no cervical adenopathy.  Neurological: He is oriented to person, place, and time. He exhibits normal muscle tone. Coordination normal.  Skin: No rash noted. No erythema.  Psychiatric: He has a normal mood and affect. His behavior is normal.  Nursing note and vitals reviewed.   ED Course  Procedures (including critical care time) DIAGNOSTIC STUDIES: Oxygen Saturation is 98% on RA, normal by my interpretation.    COORDINATION OF CARE: 6:02 PM Discussed treatment plan with pt at bedside and pt agreed to plan.  Labs Review Labs Reviewed - No data to display  Imaging Review No results found.   EKG Interpretation   Date/Time:  Friday February 17 2015 19:24:11 EST Ventricular Rate:  123 PR Interval:  147 QRS Duration: 97 QT Interval:  369 QTC Calculation: 528 R Axis:   65 Text Interpretation:  Sinus tachycardia Borderline ST depression, diffuse  leads Abnormal T, consider ischemia, inferior leads Prolonged QT interval  Artifact in lead(s) I II III aVR aVL aVF V1 V3 Confirmed by Paul Vinzant  MD,  Willman Cuny [phone]) on 02/17/2015 8:18:00 PM     CRITICAL CARE Performed by: Jeramie Scogin L Total critical care time:40 Critical care time was exclusive of separately billable procedures and treating other patients. Critical care was necessary to treat or prevent imminent or life-threatening deterioration. Critical care was time spent personally by me on the following activities: development of treatment plan with patient and/or surrogate as well as nursing, discussions with consultants, evaluation of patient's response to treatment, examination of patient, obtaining history from patient or surrogate, ordering and performing treatments and interventions, ordering and review of laboratory studies, ordering and review of radiographic studies, pulse oximetry and re-evaluation of  patient's condition.   MDM   Final diagnoses:  None    Admit for new onset diabetes and liver failure  The chart was scribed for me under my direct supervision.  I personally performed the history, physical, and medical decision making and all procedures in the evaluation of this patient.Paul Lennert, MD 02/17/15 2034

## 2015-02-17 NOTE — ED Notes (Signed)
CRITICAL VALUE ALERT  Critical value received:  Glucose 1239 Date of notification:  02/17/15  Time of notification:  2017  Critical value read back:  Yes  Nurse who received alert:  Charlette Caffey Kinaya Hilliker, RN  MD notified:  Dr. Estell HarpinZammit  Time:  2018

## 2015-02-17 NOTE — H&P (Signed)
[facility] History and Physical  HAJIME ASFAW JIR:[id number] DOB: 1966-10-18    PCP:   No PCP Per Patient   Chief Complaint: likely another seizure.  HPI: Paul [name] is an 49 y.o. male with significant alcohol abuse (4 bottles of 750cc red wine per day), hx of seizure and medication non compliance, whom I admitted last time for seizure with tongue laceration, brought to the ER as he was found down at home.  He didn't recall any event, and was brought in by a friend for "syncope".  He had ran out of his seizure medications.  Evaluation in the ER included a negative head CT, but his dilatin level was less than 2.5, and his BS was 1200, with Cr of 2.13 and K of 3.1, and no leukocytosis, and his total bili was 9, with elevated of liver Fx test with AST of 156 and ALT of 108 with alk phosphatase of 515. His alcohol level was 0. He was given IV insulin drip, and hospitalist was asked to admit him for hyperosmolar nonketotic hyperglycemia, alcohol abuse with chronic liver failure, and seizure due to non compliant with medication.  Rewiew of Systems:  Constitutional: Negative for malaise, fever and chills. No significant weight loss or weight gain Eyes: Negative for eye pain, redness and discharge, diplopia, visual changes, or flashes of light. ENMT: Negative for ear pain, hoarseness, nasal congestion, sinus pressure and sore throat. No headaches; tinnitus, drooling, or problem swallowing. Cardiovascular: Negative for chest pain, palpitations, diaphoresis, dyspnea and peripheral edema. ; No orthopnea, PND Respiratory: Negative for cough, hemoptysis, wheezing and stridor. No pleuritic chestpain. Gastrointestinal: Negative for nausea, vomiting, diarrhea, constipation, melena, blood in stool, hematemesis, jaundice and rectal bleeding.    Genitourinary: Negative for frequency, dysuria, incontinence,flank pain and hematuria; Musculoskeletal: Negative for back pain and neck pain. Negative for  swelling and trauma.;  Skin: . Negative for pruritus, rash, abrasions, bruising and skin lesion.; ulcerations Neuro: Negative for headache, lightheadedness and neck stiffness. Negative for weakness,  altered mental status, extremity weakness, burning feet, involuntary movement,   Psych: negative for anxiety, depression, insomnia, tearfulness, panic attacks, hallucinations, paranoia, suicidal or homicidal ideation    Past Medical History  Diagnosis Date  . Pancreatitis, acute     First episode earlier in 2012, hospitalized again in 02/2012 and 09/2012  . Hypertension     noncompliance  . Hyperlipidemia     noncompliance  . Lung collapse     h/o  . ETOH abuse   . Cocaine abuse   . DTs (delirium tremens)     history of  . Tobacco abuse   . Seizure disorder 07/17/2013  . Noncompliance 07/17/2013  . Traumatic subdural hematoma 06/15/2013  . 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 Open skull fracture 06/15/2013  . Polysubstance abuse     etoh, cocaine  . Nephrolithiasis   . Seizures   . Drug-seeking behavior     Past Surgical History  Procedure Laterality Date  . Mandible fracture surgery  2006  . Left axillary      surgery for deep laceration    Medications:  HOME MEDS: Prior to Admission medications   Medication Sig Start Date End Date Taking? Authorizing Provider  phenytoin (DILANTIN) 100 MG ER capsule Take 1 capsule (100 mg total) by mouth 3 (three) times daily. 01/24/15  Yes Francine Graven, DO     Allergies:  No Known Allergies  Social History:   reports that he has been smoking Cigarettes.  He  has a 20 pack-year smoking history. He has never used smokeless tobacco. He reports that he drinks alcohol. He reports that he uses illicit drugs (Cocaine) about 3 times per week.  Family History: Family History  Problem Relation Age of Onset  . Diabetes type II Mother   . Liver disease Neg Hx   . Colon cancer Neg Hx   . GI problems Neg Hx      Physical  Exam: Filed Vitals:   02/17/15 1751 02/17/15 1806 02/17/15 1930 02/17/15 2037  BP: 98/75  125/98 137/100  Pulse: 117  121 116  TempSrc:  Other (Comment)    Resp:   16 18  SpO2: 98%  100% 100%   Blood pressure 137/100, pulse 116, resp. rate 18, SpO2 100 %.  GEN:  Pleasant  patient lying in the stretcher in no acute distress; cooperative with exam. PSYCH:  alert and oriented x4; does not appear anxious or depressed; affect is appropriate. HEENT: Mucous membranes pink sclera is icteric. With no thyromegaly or carotid bruit; no JVD; There were no stridor. Neck is very supple. Breasts:: Not examined CHEST WALL: No tenderness CHEST: Normal respiration, clear to auscultation bilaterally.  HEART: Regular rate and rhythm.  There are no murmur, rub, or gallops.   BACK: No kyphosis or scoliosis; no CVA tenderness ABDOMEN: soft and non-tender; no masses, no organomegaly, normal abdominal bowel sounds; no pannus; no intertriginous candida. There is no rebound and no distention. Rectal Exam: Not done EXTREMITIES: No bone or joint deformity; age-appropriate arthropathy of the hands and knees; no edema; no ulcerations.  There is no calf tenderness. Genitalia: not examined PULSES: 2+ and symmetric SKIN: Normal hydration no rash or ulceration CNS: Cranial nerves 2-12 grossly intact no focal lateralizing neurologic deficit.  Speech is fluent; uvula elevated with phonation, facial symmetry and tongue midline. DTR are normal bilaterally, cerebella exam is intact, barbinski is negative and strengths are equaled bilaterally.  No sensory loss.   Labs on Admission:  Basic Metabolic Panel:  Recent Labs Lab 02/17/15 1818  NA 134*  K 3.1*  CL 83*  CO2 21  GLUCOSE 1239*  BUN 9  CREATININE 2.13*  CALCIUM 8.9   Liver Function Tests:  Recent Labs Lab 02/17/15 1818  AST 156*  ALT 108*  ALKPHOS 515*  BILITOT 9.8*  PROT 6.8  ALBUMIN 2.7*    Recent Labs Lab 02/17/15 1818  WBC 11.5*  NEUTROABS  9.7*  HGB 11.6*  HCT 38.9*  MCV 110.2*  PLT 116*   CBG:  Recent Labs Lab 02/17/15 2105  GLUCAP >600*     Radiological Exams on Admission: Dg Chest 1 View  02/17/2015   CLINICAL DATA:  Loss of consciousness, seizure  EXAM: CHEST  1 VIEW  COMPARISON:  8/ 17/15  FINDINGS: Cardiomediastinal silhouette is stable. No acute infiltrate or pleural effusion. No pulmonary edema. Again noted mild elevation of the left hemidiaphragm. Stable old left lower rib fractures.  IMPRESSION: No active disease.   Electronically Signed   By: Lahoma Crocker M.D.   On: 02/17/2015 19:37   Ct Head Wo Contrast  02/17/2015   CLINICAL DATA:  Seizure, syncope  EXAM: CT HEAD WITHOUT CONTRAST  TECHNIQUE: Contiguous axial images were obtained from the base of the skull through the vertex without intravenous contrast.  COMPARISON:  12/16/2014  FINDINGS: No skull fracture is noted. Paranasal sinuses and mastoid air cells are unremarkable. No intracranial hemorrhage, mass effect or midline shift. Stable cerebral atrophy. Stable chronic mild decreased  focal attenuation in left centrum semi ovale. No acute cortical infarction. No mass lesion is noted on this unenhanced scan.  IMPRESSION: No acute intracranial abnormality. Stable chronic findings as described above.   Electronically Signed   By: Lahoma Crocker M.D.   On: 02/17/2015 19:35   Assessment/Plan Present on Admission:  . Tobacco abuse . Hypertension . Elevated alkaline phosphatase level . Cocaine abuse . Chronic pancreatitis due to acute alcohol intoxication . Alcohol abuse . Uncontrolled type 2 diabetes mellitus with hyperosmolar nonketotic hyperglycemia . Chronic liver failure  PLAN: This patient has chronic liver failure, hx of polysubstance abuse including alcohol, tobacco and cocaine in the past, continued to drink, not taking his dilantin, and now had another seizure.  His level is low, so will give him a dilantin load of 1018m IV, then resume his TID doses.  He  also found to have hyperosmolar with BS of 1200, could be from chronic pancreatitis and pancreatic failure as well.  Will admit to ICU and start IVF along with IV insulin.  WIll check and INR to assess his coag fx.  He also has AKI on CKD, and I suspect it is prerenal.  Will follow his Cr with IVF.  He will be placed on CIWA protocol with PRN ativan only.  Though he is stable at this moment, due to his noncompliance and chronic severe medical problems, his prognosis is very poor, unfortunately.  I think he realized this, as he commented that his drinking is killing him.   Other plans as per orders.  Code Status: FULL CHaskel Khan MD. [facility] Pager [phone] to 7am.  02/17/2015, 9:10 PM

## 2015-02-17 NOTE — ED Notes (Signed)
Patient requesting something to drink. Explained to patient that he is NPO until test results are back.

## 2015-02-17 NOTE — ED Notes (Signed)
Patient actively vomiting. Dr. Nedra HaiLee notified

## 2015-02-17 NOTE — Progress Notes (Signed)
eLink Physician-Brief Progress Note Patient Name: Paul NanMichael A [name] DOB: 1966-10-03 MRN: [id number]   Date of Service  02/17/2015  HPI/Events of Note  Alcoholic male with sz disorder admitted after being found unconscious.  Dilantin level extremely low and patient with hyperglycemia, acute renal failure, and elevated LFT's.  Patient admitted to ICU AP.  IVF's, dilantin, insulin, and CIWA protocol.  Folate, thiamine, and MVI also given  eICU Interventions  Agree with plan No rec's     Intervention Category Evaluation Type: New Patient Evaluation  Henry RusselSMITH, Khamiya Varin, P 02/17/2015, 11:23 PM

## 2015-02-18 LAB — PHENYTOIN LEVEL, TOTAL: Phenytoin Lvl: 14.7 ug/mL (ref 10.0–20.0)

## 2015-02-18 LAB — BASIC METABOLIC PANEL
Anion gap: 15 (ref 5–15)
BUN: 6 mg/dL (ref 6–23)
CALCIUM: 7.9 mg/dL — AB (ref 8.4–10.5)
CO2: 26 mmol/L (ref 19–32)
CREATININE: 1.29 mg/dL (ref 0.50–1.35)
Chloride: 108 mmol/L (ref 96–112)
GFR calc non Af Amer: 64 mL/min — ABNORMAL LOW (ref >90)
GFR, EST AFRICAN AMERICAN: 74 mL/min — AB (ref >90)
Glucose, Bld: 167 mg/dL — ABNORMAL HIGH (ref 70–99)
Potassium: 2.7 mmol/L — CL (ref 3.5–5.1)
Sodium: 149 mmol/L — ABNORMAL HIGH (ref 135–145)

## 2015-02-18 LAB — GLUCOSE, CAPILLARY
GLUCOSE-CAPILLARY: 160 mg/dL — AB (ref 70–99)
GLUCOSE-CAPILLARY: 267 mg/dL — AB (ref 70–99)
Glucose-Capillary: 133 mg/dL — ABNORMAL HIGH (ref 70–99)
Glucose-Capillary: 159 mg/dL — ABNORMAL HIGH (ref 70–99)
Glucose-Capillary: 174 mg/dL — ABNORMAL HIGH (ref 70–99)
Glucose-Capillary: 229 mg/dL — ABNORMAL HIGH (ref 70–99)
Glucose-Capillary: 79 mg/dL (ref 70–99)
Glucose-Capillary: >600 mg/dL (ref 70–99)
Glucose-Capillary: 121 mg/dL — ABNORMAL HIGH (ref 70–99)

## 2015-02-18 LAB — MRSA PCR SCREENING: MRSA BY PCR: NEGATIVE

## 2015-02-18 LAB — URINALYSIS, ROUTINE W REFLEX MICROSCOPIC
Glucose, UA: >1000 mg/dL — AB
KETONES UR: NEGATIVE mg/dL
Leukocytes, UA: NEGATIVE
Nitrite: NEGATIVE
PH: 6 (ref 5.0–8.0)
Protein, ur: NEGATIVE mg/dL
Specific Gravity, Urine: <1.005 — ABNORMAL LOW (ref 1.005–1.030)
Urobilinogen, UA: 0.2 mg/dL (ref 0.0–1.0)

## 2015-02-18 LAB — COMPREHENSIVE METABOLIC PANEL
ALK PHOS: 397 U/L — AB (ref 39–117)
ALT: 83 U/L — ABNORMAL HIGH (ref 0–53)
AST: 149 U/L — ABNORMAL HIGH (ref 0–37)
Albumin: 2.2 g/dL — ABNORMAL LOW (ref 3.5–5.2)
Anion gap: 14 (ref 5–15)
BILIRUBIN TOTAL: 9.3 mg/dL — AB (ref 0.3–1.2)
BUN: 6 mg/dL (ref 6–23)
CHLORIDE: 104 mmol/L (ref 96–112)
CO2: 28 mmol/L (ref 19–32)
Calcium: 7.9 mg/dL — ABNORMAL LOW (ref 8.4–10.5)
Creatinine, Ser: 1.15 mg/dL (ref 0.50–1.35)
GFR calc non Af Amer: 74 mL/min — ABNORMAL LOW (ref >90)
GFR, EST AFRICAN AMERICAN: 85 mL/min — AB (ref >90)
Glucose, Bld: 182 mg/dL — ABNORMAL HIGH (ref 70–99)
Potassium: 3.9 mmol/L (ref 3.5–5.1)
Sodium: 146 mmol/L — ABNORMAL HIGH (ref 135–145)
Total Protein: 5.8 g/dL — ABNORMAL LOW (ref 6.0–8.3)

## 2015-02-18 LAB — CBC
HEMATOCRIT: 28.6 % — AB (ref 39.0–52.0)
HEMOGLOBIN: 9.2 g/dL — AB (ref 13.0–17.0)
MCH: 31.1 pg (ref 26.0–34.0)
MCHC: 32.2 g/dL (ref 30.0–36.0)
MCV: 96.6 fL (ref 78.0–100.0)
PLATELETS: 78 10*3/uL — AB (ref 150–400)
RBC: 2.96 MIL/uL — AB (ref 4.22–5.81)
RDW: 17.3 % — AB (ref 11.5–15.5)
WBC: 11.1 10*3/uL — ABNORMAL HIGH (ref 4.0–10.5)

## 2015-02-18 LAB — RAPID URINE DRUG SCREEN, HOSP PERFORMED
AMPHETAMINES: NOT DETECTED
BARBITURATES: NOT DETECTED
BENZODIAZEPINES: NOT DETECTED
Cocaine: NOT DETECTED
Opiates: NOT DETECTED
Tetrahydrocannabinol: NOT DETECTED

## 2015-02-18 LAB — AMMONIA
AMMONIA: 48 umol/L — AB (ref 11–32)
Ammonia: 49 umol/L — ABNORMAL HIGH (ref 11–32)

## 2015-02-18 LAB — PROTIME-INR
INR: 1.28 (ref 0.00–1.49)
PROTHROMBIN TIME: 16.1 s — AB (ref 11.6–15.2)

## 2015-02-18 LAB — URINE MICROSCOPIC-ADD ON

## 2015-02-18 LAB — MAGNESIUM: Magnesium: 1.8 mg/dL (ref 1.5–2.5)

## 2015-02-18 LAB — PHOSPHORUS: Phosphorus: 2.7 mg/dL (ref 2.3–4.6)

## 2015-02-18 LAB — LIPASE, BLOOD: LIPASE: 26 U/L (ref 11–59)

## 2015-02-18 LAB — AMYLASE: Amylase: 133 U/L — ABNORMAL HIGH (ref 0–105)

## 2015-02-18 MED ORDER — THIAMINE HCL 100 MG/ML IJ SOLN: 100.0000 mg | Freq: Every day | INTRAMUSCULAR | Status: DC

## 2015-02-18 MED ORDER — FOLIC ACID 1 MG PO TABS
1.0000 mg | ORAL_TABLET | Freq: Every day | ORAL | Status: DC
  Administered 2015-02-19 – 2015-02-21 (×2): 1 mg via ORAL
  Filled 2015-02-18 (×3): qty 1

## 2015-02-18 MED ORDER — VITAMIN B-1 100 MG PO TABS
100.0000 mg | ORAL_TABLET | Freq: Every day | ORAL | Status: DC
  Administered 2015-02-19 – 2015-02-21 (×2): 100 mg via ORAL
  Filled 2015-02-18 (×3): qty 1

## 2015-02-18 MED ORDER — ADULT MULTIVITAMIN W/MINERALS CH
1.0000 | ORAL_TABLET | Freq: Every day | ORAL | Status: DC
  Administered 2015-02-19 – 2015-02-21 (×2): 1 via ORAL
  Filled 2015-02-18 (×3): qty 1

## 2015-02-18 MED ORDER — GLUCERNA SHAKE PO LIQD
237.0000 mL | Freq: Three times a day (TID) | ORAL | Status: DC
  Administered 2015-02-18 – 2015-02-21 (×9): 237 mL via ORAL

## 2015-02-18 MED ORDER — LORAZEPAM 2 MG/ML IJ SOLN
1.0000 mg | Freq: Four times a day (QID) | INTRAMUSCULAR | Status: AC | PRN
  Administered 2015-02-18 – 2015-02-20 (×4): 1 mg via INTRAVENOUS
  Filled 2015-02-18 (×6): qty 1

## 2015-02-18 MED ORDER — POTASSIUM CHLORIDE CRYS ER 20 MEQ PO TBCR
40.0000 meq | EXTENDED_RELEASE_TABLET | Freq: Every day | ORAL | Status: DC
  Administered 2015-02-19 – 2015-02-21 (×2): 40 meq via ORAL
  Filled 2015-02-18 (×3): qty 2

## 2015-02-18 MED ORDER — LORAZEPAM 1 MG PO TABS
1.0000 mg | ORAL_TABLET | Freq: Four times a day (QID) | ORAL | Status: AC | PRN
  Filled 2015-02-18: qty 1

## 2015-02-18 MED ORDER — INSULIN ASPART 100 UNIT/ML ~~LOC~~ SOLN
0.0000 [IU] | SUBCUTANEOUS | Status: DC
  Administered 2015-02-18: 5 [IU] via SUBCUTANEOUS
  Administered 2015-02-18: 1 [IU] via SUBCUTANEOUS
  Administered 2015-02-19: 2 [IU] via SUBCUTANEOUS
  Administered 2015-02-19: 3 [IU] via SUBCUTANEOUS
  Administered 2015-02-19 – 2015-02-20 (×3): 1 [IU] via SUBCUTANEOUS
  Administered 2015-02-20: 3 [IU] via SUBCUTANEOUS
  Administered 2015-02-20: 2 [IU] via SUBCUTANEOUS
  Administered 2015-02-20: 1 [IU] via SUBCUTANEOUS
  Administered 2015-02-21: 3 [IU] via SUBCUTANEOUS
  Administered 2015-02-21: 1 [IU] via SUBCUTANEOUS
  Administered 2015-02-21: 2 [IU] via SUBCUTANEOUS

## 2015-02-18 MED ORDER — POTASSIUM CHLORIDE 10 MEQ/100ML IV SOLN
10.0000 meq | INTRAVENOUS | Status: AC
  Administered 2015-02-18: 10 meq via INTRAVENOUS

## 2015-02-18 MED ORDER — POTASSIUM CHLORIDE 10 MEQ/100ML IV SOLN
10.0000 meq | INTRAVENOUS | Status: DC
  Administered 2015-02-18 (×2): 10 meq via INTRAVENOUS
  Filled 2015-02-18 (×3): qty 100

## 2015-02-18 MED ORDER — INSULIN ASPART 100 UNIT/ML ~~LOC~~ SOLN
0.0000 [IU] | Freq: Three times a day (TID) | SUBCUTANEOUS | Status: DC
  Administered 2015-02-18: 2 [IU] via SUBCUTANEOUS

## 2015-02-18 MED ORDER — SODIUM CHLORIDE 0.9 % IV SOLN
INTRAVENOUS | Status: DC
  Administered 2015-02-18: 07:00:00 via INTRAVENOUS

## 2015-02-18 NOTE — Progress Notes (Signed)
INITIAL NUTRITION ASSESSMENT  DOCUMENTATION CODES Per approved criteria  -Underweight   INTERVENTION: -Glucerna Shake po TID, each supplement provides 220 kcal and 10 grams of protein  NUTRITION DIAGNOSIS: Increased protein needs related to liver failure as evidenced by 7% loss of bw in last 3 months  Goal: Pt to meet >/= 90% of their estimated nutrition needs   Monitor:  Oral intake, labs/procedure/test results, weight, diet status, goals of care  Reason for Assessment:MST of 2  49 y.o. male  Admitting Dx: Seizure disorder  ASSESSMENT: This patient has chronic liver failure, hx of polysubstance abuse including alcohol, tobacco and cocaine in the past, continued to drink, not taking his dilantin, and now had another seizure.  Height: Ht Readings from Last 1 Encounters:  02/17/15 6' (1.829 m)    Weight: Wt Readings from Last 1 Encounters:  02/18/15 134 lb 4.2 oz (60.9 kg)    Ideal Body Weight: 178 lbs  % Ideal Body Weight: 75%  Wt Readings from Last 10 Encounters:  02/18/15 134 lb 4.2 oz (60.9 kg)  01/24/15 138 lb (62.596 kg)  01/12/15 137 lb 3 oz (62.228 kg)  12/16/14 140 lb (63.504 kg)  12/06/14 140 lb (63.504 kg)  11/16/14 144 lb (65.318 kg)  11/08/14 144 lb (65.318 kg)  10/29/14 144 lb (65.318 kg)  10/10/14 144 lb (65.318 kg)  10/07/14 144 lb (65.318 kg)    Usual Body Weight: appears to be 140-145 lbs  % Usual Body Weight: 96%  BMI:  Body mass index is 18.2 kg/(m^2).  Estimated Nutritional Needs: Kcal: 2100-2450 (35-40 kcal/kg) Protein: 103-122 (1.7-2g/kg) Fluid: per md  Skin: Jaundice, Redness, dryness, cracking, and abrasions  Diet Order: Diet Carb Modified  EDUCATION NEEDS: -No education needs identified at this time   Intake/Output Summary (Last 24 hours) at 02/18/15 1033 Last data filed at 02/18/15 0648  Gross per 24 hour  Intake 5570.52 ml  Output    450 ml  Net 5120.52 ml    Last BM: 2/ 20  Labs:   Recent Labs Lab  02/17/15 1818 02/18/15 0112 02/18/15 0658  NA 134* 149* 146*  K 3.1* 2.7* 3.9  CL 83* 108 104  CO2 21 26 28   BUN 9 6 6   CREATININE 2.13* 1.29 1.15  CALCIUM 8.9 7.9* 7.9*  MG  --   --  1.8  PHOS  --   --  2.7  GLUCOSE 1239* 167* 182*    CBG (last 3)   Recent Labs  02/18/15 0600 02/18/15 0652 02/18/15 0758  GLUCAP 159* 160* 174*    Scheduled Meds: . antiseptic oral rinse  7 mL Mouth Rinse BID  . folic acid  1 mg Oral Daily  . insulin aspart  0-9 Units Subcutaneous TID WC  . insulin aspart  0-9 Units Subcutaneous 6 times per day  . multivitamin with minerals  1 tablet Oral Daily  . phenytoin  100 mg Oral 3 times per day  . potassium chloride  40 mEq Oral Daily  . sodium chloride  3 mL Intravenous Q12H  . thiamine  100 mg Oral Daily   Or  . thiamine  100 mg Intravenous Daily    Continuous Infusions:   Past Medical History  Diagnosis Date  . Pancreatitis, acute     First episode earlier in 2012, hospitalized again in 02/2012 and 09/2012  . Hypertension     noncompliance  . Hyperlipidemia     noncompliance  . Lung collapse     h/o  .  ETOH abuse   . Cocaine abuse   . DTs (delirium tremens)     history of  . Tobacco abuse   . Seizure disorder 07/17/2013  . Noncompliance 07/17/2013  . Traumatic subdural hematoma 06/15/2013  . 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 Open skull fracture 06/15/2013  . Polysubstance abuse     etoh, cocaine  . Nephrolithiasis   . Seizures   . Drug-seeking behavior     Past Surgical History  Procedure Laterality Date  . Mandible fracture surgery  2006  . Left axillary      surgery for deep laceration   Christophe Louis RD, LDN Nutrition Pager: [phone] 02/18/2015 10:34 AM

## 2015-02-18 NOTE — Progress Notes (Signed)
The patient throughout the night has been removing telemetry leads and his blood pressure cuff even though he is alert and oriented to person, place and time.  Patient states he understands but continue to be non-compliant.  The patient has complained of abdominal tenderness and warmth.  He has increased gas which has an ammonia smell to it. The MD on-call check out his abdomen and bruises to his left lower back.
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
## 2015-02-18 NOTE — Progress Notes (Signed)
CRITICAL VALUE ALERT  Critical value received:  K+ 2.7  Date of notification:  02/18/15  Time of notification:  0205  Critical value read back: yes  Nurse who received alert:  Foye Deer. Lyberti Thrush RN  MD notified (1st page):  Dr. Dayna RamusP Le  Time of first page:  0210  MD notified (2nd page):  Time of second page:  Responding MD:  Dr. Dayna RamusP. Le  Time MD responded:  0230

## 2015-02-19 ENCOUNTER — Inpatient Hospital Stay (HOSPITAL_COMMUNITY): Payer: Self-pay

## 2015-02-19 LAB — CBC WITH DIFFERENTIAL/PLATELET
Basophils Absolute: 0 10*3/uL (ref 0.0–0.1)
Basophils Relative: 0 % (ref 0–1)
Eosinophils Absolute: 0 10*3/uL (ref 0.0–0.7)
Eosinophils Relative: 0 % (ref 0–5)
HCT: 23.3 % — ABNORMAL LOW (ref 39.0–52.0)
HEMOGLOBIN: 7.6 g/dL — AB (ref 13.0–17.0)
LYMPHS ABS: 2.7 10*3/uL (ref 0.7–4.0)
Lymphocytes Relative: 33 % (ref 12–46)
MCH: 32.3 pg (ref 26.0–34.0)
MCHC: 32.6 g/dL (ref 30.0–36.0)
MCV: 99.1 fL (ref 78.0–100.0)
MONO ABS: 0.3 10*3/uL (ref 0.1–1.0)
Monocytes Relative: 3 % (ref 3–12)
NEUTROS ABS: 5.3 10*3/uL (ref 1.7–7.7)
NEUTROS PCT: 64 % (ref 43–77)
PLATELETS: 47 10*3/uL — AB (ref 150–400)
RBC: 2.35 MIL/uL — ABNORMAL LOW (ref 4.22–5.81)
RDW: 17.6 % — AB (ref 11.5–15.5)
WBC: 8.3 10*3/uL (ref 4.0–10.5)

## 2015-02-19 LAB — GLUCOSE, CAPILLARY
GLUCOSE-CAPILLARY: 125 mg/dL — AB (ref 70–99)
GLUCOSE-CAPILLARY: 78 mg/dL (ref 70–99)
GLUCOSE-CAPILLARY: 207 mg/dL — AB (ref 70–99)
GLUCOSE-CAPILLARY: 147 mg/dL — AB (ref 70–99)
Glucose-Capillary: 104 mg/dL — ABNORMAL HIGH (ref 70–99)
Glucose-Capillary: 134 mg/dL — ABNORMAL HIGH (ref 70–99)
Glucose-Capillary: 182 mg/dL — ABNORMAL HIGH (ref 70–99)
Glucose-Capillary: 75 mg/dL (ref 70–99)

## 2015-02-19 LAB — URINALYSIS, ROUTINE W REFLEX MICROSCOPIC
Glucose, UA: 100 mg/dL — AB
Hgb urine dipstick: NEGATIVE
Leukocytes, UA: NEGATIVE
NITRITE: NEGATIVE
Protein, ur: 30 mg/dL — AB
Specific Gravity, Urine: 1.01 (ref 1.005–1.030)
Urobilinogen, UA: 4 mg/dL — ABNORMAL HIGH (ref 0.0–1.0)
pH: >9 — ABNORMAL HIGH (ref 5.0–8.0)

## 2015-02-19 LAB — COMPREHENSIVE METABOLIC PANEL
ALK PHOS: 383 U/L — AB (ref 39–117)
ALT: 66 U/L — AB (ref 0–53)
ANION GAP: 6 (ref 5–15)
AST: 137 U/L — ABNORMAL HIGH (ref 0–37)
Albumin: 1.8 g/dL — ABNORMAL LOW (ref 3.5–5.2)
BILIRUBIN TOTAL: 7.9 mg/dL — AB (ref 0.3–1.2)
BUN: 6 mg/dL (ref 6–23)
CALCIUM: 7.6 mg/dL — AB (ref 8.4–10.5)
CO2: 28 mmol/L (ref 19–32)
CREATININE: 1.24 mg/dL (ref 0.50–1.35)
Chloride: 102 mmol/L (ref 96–112)
GFR calc non Af Amer: 67 mL/min — ABNORMAL LOW (ref >90)
GFR, EST AFRICAN AMERICAN: 78 mL/min — AB (ref >90)
Glucose, Bld: 97 mg/dL (ref 70–99)
Potassium: 3.7 mmol/L (ref 3.5–5.1)
SODIUM: 136 mmol/L (ref 135–145)
Total Protein: 4.9 g/dL — ABNORMAL LOW (ref 6.0–8.3)

## 2015-02-19 LAB — AMMONIA: Ammonia: 75 umol/L — ABNORMAL HIGH (ref 11–32)

## 2015-02-19 LAB — OCCULT BLOOD X 1 CARD TO LAB, STOOL: Fecal Occult Bld: NEGATIVE

## 2015-02-19 LAB — URINE MICROSCOPIC-ADD ON

## 2015-02-19 MED ORDER — LACTULOSE 10 GM/15ML PO SOLN
20.0000 g | Freq: Two times a day (BID) | ORAL | Status: DC
  Administered 2015-02-19 – 2015-02-20 (×3): 20 g via ORAL
  Filled 2015-02-19 (×5): qty 30

## 2015-02-19 MED ORDER — IOHEXOL 300 MG/ML  SOLN
100.0000 mL | Freq: Once | INTRAMUSCULAR | Status: AC | PRN
  Administered 2015-02-19: 100 mL via INTRAVENOUS

## 2015-02-19 MED ORDER — SODIUM CHLORIDE 0.9 % IV SOLN
INTRAVENOUS | Status: DC
  Administered 2015-02-19: 04:00:00 via INTRAVENOUS
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
## 2015-02-19 NOTE — Progress Notes (Signed)
SHIFT NOTE:  The patient had increased confusion throughout shift.  The patient had a few episodes of incontinence and sat with a wet blanket without calling for any assistance.  He is alert and oriented to person and place but not situation or time; his actions are not match common sense situations.  Abdomen still very warm to the touch.  MD made aware and CT Abdomen and lab draws for this morning were ordered. Patient's Ammonia level is still high at 48.

## 2015-02-19 NOTE — Progress Notes (Signed)
Report given to C.Morris, Charity fundraiserN. Pt transferred to room 320 via wheelchair with nursing staff.

## 2015-02-20 LAB — GLUCOSE, CAPILLARY
Glucose-Capillary: 136 mg/dL — ABNORMAL HIGH (ref 70–99)
Glucose-Capillary: 194 mg/dL — ABNORMAL HIGH (ref 70–99)
Glucose-Capillary: 139 mg/dL — ABNORMAL HIGH (ref 70–99)
Glucose-Capillary: 102 mg/dL — ABNORMAL HIGH (ref 70–99)

## 2015-02-20 LAB — PROTIME-INR
INR: 1.06 (ref 0.00–1.49)
Prothrombin Time: 13.9 seconds (ref 11.6–15.2)

## 2015-02-20 LAB — CBC
HEMATOCRIT: 24.1 % — AB (ref 39.0–52.0)
HEMOGLOBIN: 7.8 g/dL — AB (ref 13.0–17.0)
MCH: 32.4 pg (ref 26.0–34.0)
MCHC: 32.4 g/dL (ref 30.0–36.0)
MCV: 100 fL (ref 78.0–100.0)
PLATELETS: 58 10*3/uL — AB (ref 150–400)
RBC: 2.41 MIL/uL — ABNORMAL LOW (ref 4.22–5.81)
RDW: 17.4 % — ABNORMAL HIGH (ref 11.5–15.5)
WBC: 6.1 10*3/uL (ref 4.0–10.5)

## 2015-02-20 LAB — URINE CULTURE
COLONY COUNT: NO GROWTH
Culture: NO GROWTH

## 2015-02-20 LAB — BASIC METABOLIC PANEL
Anion gap: 8 (ref 5–15)
BUN: 7 mg/dL (ref 6–23)
CALCIUM: 8 mg/dL — AB (ref 8.4–10.5)
CO2: 26 mmol/L (ref 19–32)
CREATININE: 1.08 mg/dL (ref 0.50–1.35)
Chloride: 103 mmol/L (ref 96–112)
GFR calc non Af Amer: 79 mL/min — ABNORMAL LOW (ref >90)
Glucose, Bld: 185 mg/dL — ABNORMAL HIGH (ref 70–99)
Potassium: 3.7 mmol/L (ref 3.5–5.1)
Sodium: 137 mmol/L (ref 135–145)

## 2015-02-20 LAB — HEMOGLOBIN A1C
HEMOGLOBIN A1C: 10.8 % — AB (ref 4.8–5.6)
Mean Plasma Glucose: 263 mg/dL

## 2015-02-20 MED ORDER — GLIPIZIDE 5 MG PO TABS: 2.5000 mg | ORAL_TABLET | Freq: Every day | ORAL | Status: DC

## 2015-02-20 MED ORDER — LIVING WELL WITH DIABETES BOOK
Freq: Once | Status: AC
  Administered 2015-02-20: 12:00:00
  Filled 2015-02-20: qty 1

## 2015-02-20 MED ORDER — GLIPIZIDE ER 2.5 MG PO TB24: 2.5000 mg | ORAL_TABLET | Freq: Every day | ORAL | Status: DC

## 2015-02-20 MED ORDER — FOLIC ACID 1 MG PO TABS: 1.0000 mg | ORAL_TABLET | Freq: Every day | ORAL | Status: DC

## 2015-02-20 MED ORDER — THIAMINE HCL 100 MG PO TABS: 100.0000 mg | ORAL_TABLET | Freq: Every day | ORAL | Status: DC

## 2015-02-20 MED ORDER — GLUCERNA SHAKE PO LIQD: 237.0000 mL | Freq: Three times a day (TID) | ORAL | Status: DC

## 2015-02-20 MED ORDER — GLIPIZIDE 5 MG PO TABS
2.5000 mg | ORAL_TABLET | Freq: Every day | ORAL | Status: DC
  Administered 2015-02-21: 2.5 mg via ORAL
  Filled 2015-02-20: qty 1

## 2015-02-20 NOTE — Progress Notes (Signed)
Patient w/c arrived at the bedside.  Asked the patient how he planned to get home and he stated that he did not know. I called all the numbers he had listed as his contacts with no answer.  Messages were left to call the hospital.  Notified House Supervisor and informed him of the situation and he offered to get a cab.  I voiced to him that the patient was unable to walk and would need assistance maneuvering his w/c and getting in the house.  He then suggested [facility] but again the patient would need assistance.  So he suggested [facility] and I voiced that I would talk to the patient and schedule them for transport.  Discussed the situation with the patient who stated that he wanted to get in touch with Mr. Veverly FellsHilderbrand listed in the computer.  He voices he has no keys to get inside.  I passed on in report to the receiving nurse that the patient is attempting to find his own ride and/or trying to get keys to his home.

## 2015-02-20 NOTE — Evaluation (Signed)
Physical Therapy Evaluation Patient Details Name: Paul [name] MRN: [id number] DOB: [birth year] Today's Date: 02/20/2015   History of Present Illness  HPI: Paul [name] is an 49 y.o. male with significant alcohol abuse (4 bottles of 750cc red wine per day), hx of seizure and medication non compliance, whom I admitted last time for seizure with tongue laceration, brought to the ER as he was found down at home. He didn't recall any event, and was brought in by a friend for "syncope". He had ran out of his seizure medications. Evaluation in the ER included a negative head CT, but his dilatin level was less than 2.5, and his BS was 1200, with Cr of 2.13 and K of 3.1, and no leukocytosis, and his total bili was 9, with elevated of liver Fx test with AST of 156 and ALT of 108 with alk phosphatase of 515. His alcohol level was 0. He was given IV insulin drip, and hospitalist was asked to admit him for hyperosmolar nonketotic hyperglycemia, alcohol abuse with chronic liver failure, and seizure due to non compliant with medication.  Clinical Impression   Pt was seen for evaluation.  He was lethargic, minimally conversant.  He states that he lives with a friend who works daytime.  Up until 5 days ago he was independent.  Evaluation reveals at least fair muscle strength done by observation as he was not willing to participate in a manual muscle test.  He was agreeable to attempt ambulation only because he needed to have a bowel movement in the bathroom.  Gait was severely ataxic, even with a walker.  He is planning to return home at discharge (today).  I have been concerned about his ability to safely function in a home setting, especially when alone.  I discussed this with Dr. Verlon Au.  He has visited with the pt about our concerns and I now understand through Case Management services that pt will be provided with a w/c and HHPT as well as other Home Health services.    Follow Up Recommendations No PT  follow up    Equipment Recommendations  Wheelchair (measurements PT)    Recommendations for Other Services   none    Precautions / Restrictions Precautions Precautions: Fall Restrictions Weight Bearing Restrictions: No      Mobility  Bed Mobility Overal bed mobility: Independent                Transfers Overall transfer level: Needs assistance Equipment used: None;Rolling walker (2 wheeled) Transfers: Sit to/from Stand Sit to Stand: Min assist         General transfer comment: pt slightly unsteady with sit to stand and needed HHA to maintain stability  Ambulation/Gait Ambulation/Gait assistance: Max assist Ambulation Distance (Feet): 12 Feet (x2...to and from the bathroom, once with HHA and once with a walker) Assistive device: Rolling walker (2 wheeled);1 person hand held assist Gait Pattern/deviations: Ataxic   Gait velocity interpretation: Below normal speed for age/gender General Gait Details: gait is severely ataxic even with a walker  Stairs            Wheelchair Mobility    Modified Rankin (Stroke Patients Only)       Balance Overall balance assessment: Needs assistance Sitting-balance support: No upper extremity supported;Feet supported Sitting balance-Leahy Scale: Good     Standing balance support: No upper extremity supported Standing balance-Leahy Scale: Poor  Pertinent Vitals/Pain Pain Assessment: No/denies pain    Home Living Family/patient expects to be discharged to:: Private residence Living Arrangements: Non-relatives/Friends Available Help at Discharge: Friend(s);Available PRN/intermittently Type of Home: House Home Access: Level entry     Home Layout: One level Home Equipment: None      Prior Function Level of Independence: Independent               Hand Dominance   Dominant Hand: Right    Extremity/Trunk Assessment               Lower Extremity  Assessment: Overall WFL for tasks assessed         Communication   Communication: No difficulties  Cognition Arousal/Alertness: Lethargic Behavior During Therapy: Flat affect Overall Cognitive Status: Within Functional Limits for tasks assessed                      General Comments      Exercises        Assessment/Plan    PT Assessment All further PT needs can be met in the next venue of care  PT Diagnosis Difficulty walking;Abnormality of gait   PT Problem List    PT Treatment Interventions     PT Goals (Current goals can be found in the Care Plan section) Acute Rehab PT Goals PT Goal Formulation: All assessment and education complete, DC therapy    Frequency     Barriers to discharge  lives alone      Co-evaluation               End of Session Equipment Utilized During Treatment: Gait belt Activity Tolerance: Patient limited by fatigue;Patient limited by lethargy Patient left: in bed;with call bell/phone within reach;with bed alarm set Nurse Communication: Mobility status         Time: 9507-2257 PT Time Calculation (min) (ACUTE ONLY): 27 min   Charges:   PT Evaluation $Initial PT Evaluation Tier I: 1 Procedure     PT G CodesDemetrios Isaacs L 02/20/2015, 2:04 PM

## 2015-02-20 NOTE — Discharge Summary (Signed)
Physician Discharge Summary  Paul [name] YSA:[id number] DOB: [birth year] DOA: 02/17/2015  PCP: No PCP Per Patient  Admit date: 02/17/2015 Discharge date: 02/20/2015  Time spent: 40 minutes  Recommendations for Outpatient Follow-up:  1. [place] [facility] for evaluation of CBG and compliance with meds. Follow CBC and hg.  2.   Discharge Diagnoses:  Principal Problem:   Seizure disorder Active Problems:   Cocaine abuse   Alcohol abuse   Tobacco abuse   Hypertension   Chronic pancreatitis due to acute alcohol intoxication   Noncompliance   Elevated alkaline phosphatase level   Uncontrolled type 2 diabetes mellitus with hyperosmolar nonketotic hyperglycemia   Chronic liver failure   Discharge Condition: stable  Diet recommendation: carb modified heart healthy  Filed Weights   02/17/15 2250 02/18/15 0500 02/19/15 0500  Weight: 56.7 kg (125 lb) 60.9 kg (134 lb 4.2 oz) 62.6 kg (138 lb 0.1 oz)    History of present illness:  Paul [name] is an 49 y.o. male with significant alcohol abuse (4 bottles of 750cc red wine per day), hx of seizure and medication non compliance, who was admitted last time for seizure with tongue laceration, brought to the ER on 02/17/15 as he was found down at home. He didn't recall any event, and was brought in by a friend for "syncope". He had ran out of his seizure medications. Evaluation in the ER included a negative head CT, but his dilatin level was less than 2.5, and his BS was 1200, with Cr of 2.13 and K of 3.1, and no leukocytosis, and his total bili was 9, with elevated of liver Fx test with AST of 156 and ALT of 108 with alk phosphatase of 515. His alcohol level was 0. He was given IV insulin drip, and admitte for hyperosmolar nonketotic hyperglycemia, alcohol abuse with chronic liver failure, and seizure due to non compliant with medication.  Hospital Course:  1. Chest pain-potentially secondary to ethanol gastritis. EKG  from admission showed sinus tachycardia with rate related changes but no overt ST-T wave inversions. No further complaints of chest pain at discharge 2. Multifactorial anemia DDX ethanolism, FOBT negative. Hg 7.8 at discharge. Chart review indicates hg 11 last month. Asymptomatic. Recommend OP follow up 3. Probable alcohol withdrawal seizure. On admission phenytoin level was subtherapeutic at 2.5. Dilantin resumed. Will be discharged on dilantin. No further seizure activity.   4. Ethanolism-no s/sx withdrawal. Counseled regarding ETOH use 5. Metabolic acidosis secondary to either alcohol or hyperosmolar nonketotic state. Anion gap 15 on admission 15. Resolved at discharge. Started on glipizide 6. Hyperosmolar nonketotic state- in the past have shown blood sugars anywhere from 100-200. A1c 10.8. Started on glipizide. Compliance a concern. Discussed carb modified diet.   7. Diabetes mellitus-see above. Follow up with Health department. Compliance a concern 8. Multiple metabolic abnormalities and electrolyte disturbances secondary to alcoholism, starvation ketosis. Much improved at discharge. Magnesium is normal. 9. Alcoholic liver disease-meld score ~28. He resisted discussing anything--his bilirubin is elevated which is an extremely poor prognostic sign in terms of survival and he is not a candidate for transplant given continued ethanolism 10. Hyperammonemia- improved at discharge. He was provided with lactulose. No ascites at discharge. 11. Medical noncompliance-provided with information regarding ETOH abuse, liver disease, diabetes, anemia. He barely opened his eyes during this encounter.   Procedures:  none  Consultations:  none  Discharge Exam: Filed Vitals:   02/20/15 0559  BP: 123/80  Pulse: 102  Temp: 98.5 F (36.9  C)  Resp: 18    General: thin somewhat frail appears comfortable Cardiovascular: RRR no MGR No LE edema Respiratory: normal effort somewhat shallow. BS clear  bilaterally no wheeze  Discharge Instructions    Current Discharge Medication List    START taking these medications   Details  feeding supplement, GLUCERNA SHAKE, (GLUCERNA SHAKE) LIQD Take 237 mLs by mouth 3 (three) times daily between meals. Refills: 0    folic acid (FOLVITE) 1 MG tablet Take 1 tablet (1 mg total) by mouth daily.    glipiZIDE (GLUCOTROL) 5 MG tablet Take 0.5 tablets (2.5 mg total) by mouth daily before breakfast. Qty: 30 tablet, Refills: 0    thiamine 100 MG tablet Take 1 tablet (100 mg total) by mouth daily.      CONTINUE these medications which have NOT CHANGED   Details  phenytoin (DILANTIN) 100 MG ER capsule Take 1 capsule (100 mg total) by mouth 3 (three) times daily. Qty: 90 capsule, Refills: 0       No Known Allergies    The results of significant diagnostics from this hospitalization (including imaging, microbiology, ancillary and laboratory) are listed below for reference.    Significant Diagnostic Studies: Dg Chest 1 View  02/17/2015   CLINICAL DATA:  Loss of consciousness, seizure  EXAM: CHEST  1 VIEW  COMPARISON:  8/ 17/15  FINDINGS: Cardiomediastinal silhouette is stable. No acute infiltrate or pleural effusion. No pulmonary edema. Again noted mild elevation of the left hemidiaphragm. Stable old left lower rib fractures.  IMPRESSION: No active disease.   Electronically Signed   By: Lahoma Crocker M.D.   On: 02/17/2015 19:37   Ct Head Wo Contrast  02/17/2015   CLINICAL DATA:  Seizure, syncope  EXAM: CT HEAD WITHOUT CONTRAST  TECHNIQUE: Contiguous axial images were obtained from the base of the skull through the vertex without intravenous contrast.  COMPARISON:  12/16/2014  FINDINGS: No skull fracture is noted. Paranasal sinuses and mastoid air cells are unremarkable. No intracranial hemorrhage, mass effect or midline shift. Stable cerebral atrophy. Stable chronic mild decreased focal attenuation in left centrum semi ovale. No acute cortical  infarction. No mass lesion is noted on this unenhanced scan.  IMPRESSION: No acute intracranial abnormality. Stable chronic findings as described above.   Electronically Signed   By: Lahoma Crocker M.D.   On: 02/17/2015 19:35   Ct Abdomen Pelvis W Contrast  02/19/2015   CLINICAL DATA:  Intermittent generalized are chest abdominal pain  EXAM: CT ABDOMEN AND PELVIS WITH CONTRAST  TECHNIQUE: Multidetector CT imaging of the abdomen and pelvis was performed using the standard protocol following bolus administration of intravenous contrast.  CONTRAST:  189m OMNIPAQUE IOHEXOL 300 MG/ML  SOLN  COMPARISON:  To 15  FINDINGS: There is diffuse fatty infiltration of the liver without focal lesion. There is no bile duct dilatation. Spleen appears normal. There are changes of chronic pancreatitis with extensive calcification in multiple cystic changes, not significantly changed. The adrenals and kidneys appear unremarkable. There is a hiatal hernia. There are unremarkable appearances of small bowel. There is mild colonic mural thickening without evidence of colitis or focal inflammatory change. There is a small volume ascites. No significant abnormalities are evident in the lower chest. Bilateral L5 spondylolysis and grade 1 spondylolisthesis at L5-S1 incidentally noted.  IMPRESSION: 1. Fatty liver 2. Chronic pancreatitis 3. Hiatal hernia 4. Small volume ascites 5. Negative for obstruction, perforation or focal inflammatory change. 6. Bilateral L5 spondylolysis and grade 1 L5-S1 spondylolisthesis  Electronically Signed   By: Andreas Newport M.D.   On: 02/19/2015 06:30    Microbiology: Recent Results (from the past 240 hour(s))  MRSA PCR Screening     Status: None   Collection Time: 02/17/15 10:31 PM  Result Value Ref Range Status   MRSA by PCR NEGATIVE NEGATIVE Final    Comment:        The GeneXpert MRSA Assay (FDA approved for NASAL specimens only), is one component of a comprehensive MRSA  colonization surveillance program. It is not intended to diagnose MRSA infection nor to guide or monitor treatment for MRSA infections.   Culture, Urine     Status: None   Collection Time: 02/18/15 12:04 AM  Result Value Ref Range Status   Specimen Description URINE, CLEAN CATCH  Final   Special Requests NONE  Final   Colony Count NO GROWTH Performed at [facility]   Final   Culture NO GROWTH Performed at [facility]   Final   Report Status 02/20/2015 FINAL  Final     Labs: Basic Metabolic Panel:  Recent Labs Lab 02/17/15 1818 02/18/15 0112 02/18/15 0658 02/19/15 0701 02/20/15 0545  NA 134* 149* 146* 136 137  K 3.1* 2.7* 3.9 3.7 3.7  CL 83* 108 104 102 103  CO2 21 26 28 28 26   GLUCOSE 1239* 167* 182* 97 185*  BUN 9 6 6 6 7   CREATININE 2.13* 1.29 1.15 1.24 1.08  CALCIUM 8.9 7.9* 7.9* 7.6* 8.0*  MG  --   --  1.8  --   --   PHOS  --   --  2.7  --   --    Liver Function Tests:  Recent Labs Lab 02/17/15 1818 02/18/15 0658 02/19/15 0701  AST 156* 149* 137*  ALT 108* 83* 66*  ALKPHOS 515* 397* 383*  BILITOT 9.8* 9.3* 7.9*  PROT 6.8 5.8* 4.9*  ALBUMIN 2.7* 2.2* 1.8*    Recent Labs Lab 02/18/15 0658  LIPASE 26  AMYLASE 133*    Recent Labs Lab 02/18/15 0658 02/18/15 2127 02/19/15 0701  AMMONIA 49* 48* 75*   CBC:  Recent Labs Lab 02/17/15 1818 02/18/15 0658 02/19/15 0701 02/20/15 0545  WBC 11.5* 11.1* 8.3 6.1  NEUTROABS 9.7*  --  5.3  --   HGB 11.6* 9.2* 7.6* 7.8*  HCT 38.9* 28.6* 23.3* 24.1*  MCV 110.2* 96.6 99.1 100.0  PLT 116* 78* 47* 58*   Cardiac Enzymes: No results for input(s): CKTOTAL, CKMB, CKMBINDEX, TROPONINI in the last 168 hours. BNP: BNP (last 3 results) No results for input(s): BNP in the last 8760 hours.  ProBNP (last 3 results) No results for input(s): PROBNP in the last 8760 hours.  CBG:  Recent Labs Lab 02/19/15 1557 02/19/15 1959 02/19/15 2347 02/20/15 0404 02/20/15 0735  GLUCAP 207*  147* 134* 136* 194*       Signed:  Dyanne Carrel M  [facility] 02/20/2015, 11:30 AM

## 2015-02-20 NOTE — Progress Notes (Signed)
Notified Dr. Mahala MenghiniSamtani that the patient stated that he could not stand to do do the ordered orthostatic vital signs.  MD verbalized understanding and stated that it was okay not to get them.

## 2015-02-20 NOTE — Care Management Note (Addendum)
    Page 1 of 2   02/21/2015     11:40:47 AM CARE MANAGEMENT NOTE 02/21/2015  Patient:  Ernestine McmurrayDOGGETT,Emmerson A   Account Number:  [id number]  Date Initiated:  02/20/2015  Documentation initiated by:  Sharrie RothmanBLACKWELL,Cionna Collantes C  Subjective/Objective Assessment:   Pt admitted from home with seizures and hyperglycemia. Pt lives with friends and will return home at discharge. Pt is very noncompliant with medications and substance abuse and medical followup.     Action/Plan:   [facility] pts [facility]. Pt made aware of appt times and documented on AVS. Pt does not need MATCH as only new medication is glipizide and it is on the $4 list. 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     HH arranged  HH-2 PT      [facility]  [facility]   Status of service:  Completed, signed off Medicare Important Message given?   (If response is "NO", the following Medicare IM given date fields will be blank) Date Medicare IM given:   Medicare IM given by:   Date Additional Medicare IM given:   Additional Medicare IM given by:    Discharge Disposition:  HOME W HOME HEALTH SERVICES  Per UR Regulation:    If discussed at Long Length of Stay Meetings, dates discussed:    Comments:  02/21/15 1135 Arlyss Queenammy Yanelly Cantrelle, RN BSN CM Pt was unable to get transportation home on 02/20/15. Toni Amendourtney, RN was able to speak with pts room mate and he will be coming at 5 PM to take pt home. [facility] with pt has the W/C in his room. No other CM needs noted.  02/20/15 1355 Arlyss Queenammy Ellen Mayol, RN BSN CM PT evaluated pt and  recommends HH PT and w/c. Pt has no insurance and [facility]. Alroy BailiffLinda Lothian of [facility] is aware and will collect the pts information from the chart. HH servcies to start within 48 hours of discharge. [facility]. Pt and pts nurse aware of discharge arrangements.   02/20/15 1230 Arlyss Queenammy Denicia Pagliarulo, RN BSN CM and become more compliant with medications and MD followup. Pt verbalized understanding.

## 2015-02-20 NOTE — Progress Notes (Signed)
UR chart review completed.  

## 2015-02-20 NOTE — Progress Notes (Signed)
Patient was admitted with an initial lab glucose of 1239 mg/dl on 4/09/812/19/16 and [id number] was 10.8% on 02/18/15. Patient was placed on the insulin drip for approximately 9 hours and then transitioned off IV insulin to SQ Novolog correction only. CBGs ranged from 78-207 mg/dl on 4/78/292/21/16 and patient was given a total of Novolog 7 units for correction on 02/19/15. Fasting glucose this morning was 194 mg/dl. Went to speak with patient about new diabetes diagnosis. Patient was in bed with his head covered up. After calling patient's name several times and tapping him on the shoulder patient uncovered his head and opened his eyes. Introduced self and informed patient I would like to talk with him about diabetes. Patient closed his eyes back and provided one word answers mostly to questions asked. In talking with the patient he reports that he has been told in the past that he has diabetes and he is not taking any medication for diabetes as an outpatient. Discussed A1C results (10.8% on 02/18/15) and explained what an A1C is, basic pathophysiology of DM Type 2, basic home care, importance of checking CBGs and maintaining good CBG control to prevent long-term and short-term complications.  Reviewed signs and symptoms of hyperglycemia and hypoglycemia along with treatment for both.Discussed impact of nutrition, exercise, stress, sickness, and medications on diabetes control. Discussed carbohydrates, carbohydrate goals per day and meal, along with portion sizes. Noted in H&P that patient drinks 4 bottles of wine. Asked patient how much wine he drank per day and he yelled "I don't know". Explained that wine has a lot of sugar and how drinking to wine increases blood glucose. Throughout the conversation with the patient I had to call his name and tap his shoulder for him to open his eyes and respond to me verbally. Patient states that he does not have a PCP. Tammy, RN, CM is working to [facility]. Informed patient that MD was prescribing Glipizide for DM control and discussed how Glipizide works to help maintain glycemic control. Patient was informed that Glipizide is on the [employer] $4 list. When asked if he understood information discussed, patient shook head up and down to indicate yes. When asked if patient had any questions related to diabetes, he shook his head side to side to indicate no.  RNs to provide ongoing basic DM education at bedside with this patient. Have ordered educational booklet.   Thanks, Orlando PennerMarie Chau Sawin, RN, MSN, CCRN Diabetes Coordinator Inpatient Diabetes Program [phone] (Team Pager) [phone] (AP office) [phone] [facility] office)

## 2015-02-21 LAB — GLUCOSE, CAPILLARY
Glucose-Capillary: 114 mg/dL — ABNORMAL HIGH (ref 70–99)
Glucose-Capillary: 126 mg/dL — ABNORMAL HIGH (ref 70–99)
Glucose-Capillary: 168 mg/dL — ABNORMAL HIGH (ref 70–99)
Glucose-Capillary: 203 mg/dL — ABNORMAL HIGH (ref 70–99)
Glucose-Capillary: 219 mg/dL — ABNORMAL HIGH (ref 70–99)
Glucose-Capillary: 85 mg/dL (ref 70–99)

## 2015-02-21 MED ORDER — PHENYTOIN SODIUM EXTENDED 100 MG PO CAPS: 100.0000 mg | ORAL_CAPSULE | Freq: Three times a day (TID) | ORAL | Status: DC

## 2015-02-21 NOTE — Progress Notes (Deleted)
Attempted to reach Lavera GuiseLester Hildebrand to confirm that he will be picking patient up. Left a voicemail message for him to call back. Will try again before shift change.

## 2015-02-21 NOTE — Progress Notes (Signed)
NURSING PROGRESS NOTE  Paul [name] Wronski [id number] Discharge Data: 02/21/2015 6:37 PM Attending Provider: Rhetta MuraJai-Gurmukh Samtani, MD PCP:No PCP Per Patient   Paul [name] Koral to be D/C'd Home per MD order.    All IV's discontinued and monitored for bleeding.  All belongings returned to patient for patient to take home.  AVS summary and prescriptions reviewed with patient. Expressed importance of patient [facility].  Wheelchair provided for patient.   Patient left floor via wheelchair, escorted by NT.  Last Documented Vital Signs:  Blood pressure 137/89, pulse 95, temperature 98.1 F (36.7 C), temperature source Oral, resp. rate 18, height 6' (1.829 m), weight 62.6 kg (138 lb 0.1 oz), SpO2 99 %.  Mertha BaarsHorine, Gael Delude D

## 2015-02-21 NOTE — Progress Notes (Signed)
Made contact via telephone with friend of patient, Paul [name]. Mr. Paul [name] stated he would be able to pick Paul [name] up from hospital after he finished work at [employer]pm today. Made Case Management aware.

## 2015-02-21 NOTE — Discharge Summary (Signed)
See discharge summary dated 02/20/15.  Patient seen and examined. No changes to exam noted in summary dated 02/20/15    Clydie BraunKaren m black, np

## 2015-03-20 ENCOUNTER — Emergency Department (HOSPITAL_COMMUNITY)
Admission: EM | Admit: 2015-03-20 | Discharge: 2015-03-20 | Discharge status: Against Medical Advice | Payer: Self-pay | Attending: Emergency Medicine | Admitting: Emergency Medicine

## 2015-03-20 ENCOUNTER — Encounter (HOSPITAL_COMMUNITY): Payer: Self-pay | Admitting: *Deleted

## 2015-03-20 DIAGNOSIS — R109 Unspecified abdominal pain: Secondary | ICD-10-CM | POA: Insufficient documentation

## 2015-03-20 DIAGNOSIS — Z720 Tobacco use: Secondary | ICD-10-CM | POA: Insufficient documentation

## 2015-03-20 DIAGNOSIS — E119 Type 2 diabetes mellitus without complications: Secondary | ICD-10-CM | POA: Insufficient documentation

## 2015-03-20 HISTORY — DX: Type 2 diabetes mellitus without complications: E11.9

## 2015-03-20 LAB — CBC WITH DIFFERENTIAL/PLATELET
BASOS ABS: 0 10*3/uL (ref 0.0–0.1)
Basophils Relative: 0 % (ref 0–1)
Eosinophils Absolute: 0.1 10*3/uL (ref 0.0–0.7)
Eosinophils Relative: 2 % (ref 0–5)
HCT: 31.4 % — ABNORMAL LOW (ref 39.0–52.0)
HEMOGLOBIN: 10 g/dL — AB (ref 13.0–17.0)
LYMPHS ABS: 1.4 10*3/uL (ref 0.7–4.0)
Lymphocytes Relative: 27 % (ref 12–46)
MCH: 33.2 pg (ref 26.0–34.0)
MCHC: 31.8 g/dL (ref 30.0–36.0)
MCV: 104.3 fL — ABNORMAL HIGH (ref 78.0–100.0)
MONOS PCT: 6 % (ref 3–12)
Monocytes Absolute: 0.3 10*3/uL (ref 0.1–1.0)
NEUTROS ABS: 3.5 10*3/uL (ref 1.7–7.7)
NEUTROS PCT: 65 % (ref 43–77)
Platelets: 203 10*3/uL (ref 150–400)
RBC: 3.01 MIL/uL — ABNORMAL LOW (ref 4.22–5.81)
RDW: 14.6 % (ref 11.5–15.5)
WBC: 5.3 10*3/uL (ref 4.0–10.5)

## 2015-03-20 LAB — LIPASE, BLOOD: LIPASE: 14 U/L (ref 11–59)

## 2015-03-20 LAB — AMYLASE: AMYLASE: 177 U/L — AB (ref 0–105)

## 2015-03-20 LAB — COMPREHENSIVE METABOLIC PANEL
ALK PHOS: 364 U/L — AB (ref 39–117)
ALT: 33 U/L (ref 0–53)
AST: 46 U/L — ABNORMAL HIGH (ref 0–37)
Albumin: 2 g/dL — ABNORMAL LOW (ref 3.5–5.2)
Anion gap: 11 (ref 5–15)
BUN: <5 mg/dL — ABNORMAL LOW (ref 6–23)
CALCIUM: 7.7 mg/dL — AB (ref 8.4–10.5)
CO2: 17 mmol/L — AB (ref 19–32)
Chloride: 108 mmol/L (ref 96–112)
Creatinine, Ser: 1.11 mg/dL (ref 0.50–1.35)
GFR, EST AFRICAN AMERICAN: 89 mL/min — AB (ref >90)
GFR, EST NON AFRICAN AMERICAN: 77 mL/min — AB (ref >90)
Glucose, Bld: 142 mg/dL — ABNORMAL HIGH (ref 70–99)
Potassium: 3.1 mmol/L — ABNORMAL LOW (ref 3.5–5.1)
SODIUM: 136 mmol/L (ref 135–145)
Total Bilirubin: 1.8 mg/dL — ABNORMAL HIGH (ref 0.3–1.2)
Total Protein: 5.2 g/dL — ABNORMAL LOW (ref 6.0–8.3)

## 2015-03-20 LAB — CBG MONITORING, ED: Glucose-Capillary: 132 mg/dL — ABNORMAL HIGH (ref 70–99)

## 2015-03-20 NOTE — ED Notes (Signed)
abd pain, leg  Pain for 2 weeks, recent dx with DM   And adm to hospital

## 2015-03-20 NOTE — ED Notes (Signed)
Called for room at 1400. No answer in waiting room.

## 2015-04-13 ENCOUNTER — Emergency Department (HOSPITAL_COMMUNITY)
Admission: EM | Admit: 2015-04-13 | Discharge: 2015-04-13 | Discharge status: Against Medical Advice | Payer: Self-pay | Attending: Emergency Medicine | Admitting: Emergency Medicine

## 2015-04-13 ENCOUNTER — Encounter (HOSPITAL_COMMUNITY): Payer: Self-pay | Admitting: *Deleted

## 2015-04-13 DIAGNOSIS — G40909 Epilepsy, unspecified, not intractable, without status epilepticus: Secondary | ICD-10-CM | POA: Insufficient documentation

## 2015-04-13 DIAGNOSIS — Z8781 Personal history of (healed) traumatic fracture: Secondary | ICD-10-CM | POA: Insufficient documentation

## 2015-04-13 DIAGNOSIS — Z79899 Other long term (current) drug therapy: Secondary | ICD-10-CM | POA: Insufficient documentation

## 2015-04-13 DIAGNOSIS — E119 Type 2 diabetes mellitus without complications: Secondary | ICD-10-CM | POA: Insufficient documentation

## 2015-04-13 DIAGNOSIS — E872 Acidosis, unspecified: Secondary | ICD-10-CM

## 2015-04-13 DIAGNOSIS — R569 Unspecified convulsions: Secondary | ICD-10-CM

## 2015-04-13 DIAGNOSIS — Z9119 Patient's noncompliance with other medical treatment and regimen: Secondary | ICD-10-CM | POA: Insufficient documentation

## 2015-04-13 DIAGNOSIS — Z720 Tobacco use: Secondary | ICD-10-CM | POA: Insufficient documentation

## 2015-04-13 DIAGNOSIS — Z8659 Personal history of other mental and behavioral disorders: Secondary | ICD-10-CM | POA: Insufficient documentation

## 2015-04-13 DIAGNOSIS — Z87828 Personal history of other (healed) physical injury and trauma: Secondary | ICD-10-CM | POA: Insufficient documentation

## 2015-04-13 DIAGNOSIS — Z87442 Personal history of urinary calculi: Secondary | ICD-10-CM | POA: Insufficient documentation

## 2015-04-13 DIAGNOSIS — Z8719 Personal history of other diseases of the digestive system: Secondary | ICD-10-CM | POA: Insufficient documentation

## 2015-04-13 DIAGNOSIS — I10 Essential (primary) hypertension: Secondary | ICD-10-CM | POA: Insufficient documentation

## 2015-04-13 LAB — CBC WITH DIFFERENTIAL/PLATELET
BASOS ABS: 0 10*3/uL (ref 0.0–0.1)
Basophils Relative: 0 % (ref 0–1)
EOS PCT: 1 % (ref 0–5)
Eosinophils Absolute: 0 10*3/uL (ref 0.0–0.7)
HCT: 32.7 % — ABNORMAL LOW (ref 39.0–52.0)
Hemoglobin: 10.5 g/dL — ABNORMAL LOW (ref 13.0–17.0)
LYMPHS ABS: 1.1 10*3/uL (ref 0.7–4.0)
Lymphocytes Relative: 22 % (ref 12–46)
MCH: 31.5 pg (ref 26.0–34.0)
MCHC: 32.1 g/dL (ref 30.0–36.0)
MCV: 98.2 fL (ref 78.0–100.0)
MONO ABS: 0.4 10*3/uL (ref 0.1–1.0)
Monocytes Relative: 8 % (ref 3–12)
Neutro Abs: 3.6 10*3/uL (ref 1.7–7.7)
Neutrophils Relative %: 69 % (ref 43–77)
Platelets: 226 10*3/uL (ref 150–400)
RBC: 3.33 MIL/uL — ABNORMAL LOW (ref 4.22–5.81)
RDW: 14.9 % (ref 11.5–15.5)
WBC: 5.2 10*3/uL (ref 4.0–10.5)

## 2015-04-13 LAB — RAPID URINE DRUG SCREEN, HOSP PERFORMED
AMPHETAMINES: NOT DETECTED
Barbiturates: NOT DETECTED
Benzodiazepines: NOT DETECTED
Cocaine: NOT DETECTED
Opiates: NOT DETECTED
TETRAHYDROCANNABINOL: NOT DETECTED

## 2015-04-13 LAB — URINALYSIS, ROUTINE W REFLEX MICROSCOPIC
BILIRUBIN URINE: NEGATIVE
GLUCOSE, UA: NEGATIVE mg/dL
Hgb urine dipstick: NEGATIVE
KETONES UR: NEGATIVE mg/dL
Leukocytes, UA: NEGATIVE
Nitrite: NEGATIVE
Protein, ur: NEGATIVE mg/dL
SPECIFIC GRAVITY, URINE: 1.02 (ref 1.005–1.030)
Urobilinogen, UA: 0.2 mg/dL (ref 0.0–1.0)
pH: 5 (ref 5.0–8.0)

## 2015-04-13 LAB — COMPREHENSIVE METABOLIC PANEL
ALBUMIN: 1.8 g/dL — AB (ref 3.5–5.2)
ALT: 22 U/L (ref 0–53)
AST: 46 U/L — ABNORMAL HIGH (ref 0–37)
Alkaline Phosphatase: 282 U/L — ABNORMAL HIGH (ref 39–117)
Anion gap: 11 (ref 5–15)
BILIRUBIN TOTAL: 1 mg/dL (ref 0.3–1.2)
BUN: <5 mg/dL — ABNORMAL LOW (ref 6–23)
CO2: 16 mmol/L — ABNORMAL LOW (ref 19–32)
CREATININE: 0.9 mg/dL (ref 0.50–1.35)
Calcium: 7.8 mg/dL — ABNORMAL LOW (ref 8.4–10.5)
Chloride: 117 mmol/L — ABNORMAL HIGH (ref 96–112)
GFR calc non Af Amer: >90 mL/min (ref >90)
GLUCOSE: 101 mg/dL — AB (ref 70–99)
Potassium: 3.6 mmol/L (ref 3.5–5.1)
Sodium: 144 mmol/L (ref 135–145)
Total Protein: 5.5 g/dL — ABNORMAL LOW (ref 6.0–8.3)

## 2015-04-13 LAB — PHENYTOIN LEVEL, TOTAL: Phenytoin Lvl: <2.5 ug/mL — ABNORMAL LOW (ref 10.0–20.0)

## 2015-04-13 MED ORDER — HYDRALAZINE HCL 20 MG/ML IJ SOLN: 10.0000 mg | Freq: Once | INTRAMUSCULAR | Status: DC

## 2015-04-13 MED ORDER — PHENYTOIN 50 MG PO CHEW
CHEWABLE_TABLET | ORAL | Status: AC
  Filled 2015-04-13: qty 2

## 2015-04-13 MED ORDER — FUROSEMIDE 10 MG/ML IJ SOLN
40.0000 mg | Freq: Once | INTRAMUSCULAR | Status: AC
  Administered 2015-04-13: 40 mg via INTRAVENOUS
  Filled 2015-04-13: qty 4

## 2015-04-13 MED ORDER — SODIUM CHLORIDE 0.9 % IV BOLUS (SEPSIS): 1000.0000 mL | Freq: Once | INTRAVENOUS | Status: DC

## 2015-04-13 MED ORDER — LABETALOL HCL 5 MG/ML IV SOLN
20.0000 mg | Freq: Once | INTRAVENOUS | Status: AC
  Administered 2015-04-13: 20 mg via INTRAVENOUS
  Filled 2015-04-13: qty 4

## 2015-04-13 MED ORDER — HYDROCODONE-ACETAMINOPHEN 5-325 MG PO TABS
1.0000 | ORAL_TABLET | Freq: Once | ORAL | Status: AC
  Administered 2015-04-13: 1 via ORAL
  Filled 2015-04-13: qty 1

## 2015-04-13 MED ORDER — PHENYTOIN 50 MG PO CHEW
100.0000 mg | CHEWABLE_TABLET | Freq: Once | ORAL | Status: DC
  Filled 2015-04-13: qty 2

## 2015-04-13 NOTE — ED Notes (Signed)
Someone else in the house heard a noise and went to check on him and "he was out of it" per EMS. Pt was initially post ictal when EMS first arrived but was able to follow commands. Lower extremity swelling x 3 days.

## 2015-04-13 NOTE — ED Notes (Signed)
Dr. Elesa MassedWard spoke with pt. Pt. Still insisting on leaving. Risks of leaving explained to pt. Pt. Verbalized understanding of risks, including death. Pt. Still wants to sign out AMA at this time. Explained to pt. That if he changes his mind that he should seek medical help/return to ED. Pt. Verbalized understanding.

## 2015-04-13 NOTE — Discharge Instructions (Signed)
You were seen in the emergency department after likely having a seizure. One of your blood test called your bicarbonate level is extremely low which may be secondary to dehydration or because of your recent seizure. I have recommend you stay for further blood tests, possible admission to the hospital which you have refused. Your blood pressure is also very elevated. This is something you will need to be on blood pressure medication for. Have given you resources for primary care physicians. Your Dilantin muscle was also nondetectable. You need to start taking her Dilantin as prescribed for your seizures. You're leaving the hospital AGAINST MEDICAL ADVICE and must understand that there are risks with leaving including significant disability and death. If you decide to be treated or begin feeling worse please come back to the hospital any time.   Epilepsy Epilepsy is a disorder in which a person has repeated seizures over time. A seizure is a release of abnormal electrical activity in the brain. Seizures can cause a change in attention, behavior, or the ability to remain awake and alert (altered mental status). Seizures often involve uncontrollable shaking (convulsions).  Most people with epilepsy lead normal lives. However, people with epilepsy are at an increased risk of falls, accidents, and injuries. Therefore, it is important to begin treatment right away. CAUSES  Epilepsy has many possible causes. Anything that disturbs the normal pattern of brain cell activity can lead to seizures. This may include:   Head injury.  Birth trauma.  High fever as a child.  Stroke.  Bleeding into or around the brain.  Certain drugs.  Prolonged low oxygen, such as what occurs after CPR efforts.  Abnormal brain development.  Certain illnesses, such as meningitis, encephalitis (brain infection), malaria, and other infections.  An imbalance of nerve signaling chemicals (neurotransmitters).  SIGNS AND  SYMPTOMS  The symptoms of a seizure can vary greatly from one person to another. Right before a seizure, you may have a warning (aura) that a seizure is about to occur. An aura may include the following symptoms:  Fear or anxiety.  Nausea.  Feeling like the room is spinning (vertigo).  Vision changes, such as seeing flashing lights or spots. Common symptoms during a seizure include:  Abnormal sensations, such as an abnormal smell or a bitter taste in the mouth.   Sudden, general body stiffness.   Convulsions that involve rhythmic jerking of the face, arm, or leg on one or both sides.   Sudden change in consciousness.   Appearing to be awake but not responding.   Appearing to be asleep but cannot be awakened.   Grimacing, chewing, lip smacking, drooling, tongue biting, or loss of bowel or bladder control. After a seizure, you may feel sleepy for a while. DIAGNOSIS  Your health care provider will ask about your symptoms and take a medical history. Descriptions from any witnesses to your seizures will be very helpful in the diagnosis. A physical exam, including a detailed neurological exam, is necessary. Various tests may be done, such as:   An electroencephalogram (EEG). This is a painless test of your brain waves. In this test, a diagram is created of your brain waves. These diagrams can be interpreted by a specialist.  An MRI of the brain.   A CT scan of the brain.   A spinal tap (lumbar puncture, LP).  Blood tests to check for signs of infection or abnormal blood chemistry. TREATMENT  There is no cure for epilepsy, but it is generally treatable. Once  epilepsy is diagnosed, it is important to begin treatment as soon as possible. For most people with epilepsy, seizures can be controlled with medicines. The following may also be used:  A pacemaker for the brain (vagus nerve stimulator) can be used for people with seizures that are not well controlled by  medicine.  Surgery on the brain. For some people, epilepsy eventually goes away. HOME CARE INSTRUCTIONS   Follow your health care provider's recommendations on driving and safety in normal activities.  Get enough rest. Lack of sleep can cause seizures.  Only take over-the-counter or prescription medicines as directed by your health care provider. Take any prescribed medicine exactly as directed.  Avoid any known triggers of your seizures.  Keep a seizure diary. Record what you recall about any seizure, especially any possible trigger.   Make sure the people you live and work with know that you are prone to seizures. They should receive instructions on how to help you. In general, a witness to a seizure should:   Cushion your head and body.   Turn you on your side.   Avoid unnecessarily restraining you.   Not place anything inside your mouth.   Call for emergency medical help if there is any question about what has occurred.   Follow up with your health care provider as directed. You may need regular blood tests to monitor the levels of your medicine.  SEEK MEDICAL CARE IF:   You develop signs of infection or other illness. This might increase the risk of a seizure.   You seem to be having more frequent seizures.   Your seizure pattern is changing.  SEEK IMMEDIATE MEDICAL CARE IF:   You have a seizure that does not stop after a few moments.   You have a seizure that causes any difficulty in breathing.   You have a seizure that results in a very severe headache.   You have a seizure that leaves you with the inability to speak or use a part of your body.  Document Released: 12/16/2005 Document Revised: 10/06/2013 Document Reviewed: 07/28/2013 [facility] Patient Information 2015 [place], [place]. This information is not intended to replace advice given to you by your health care provider. Make sure you discuss any questions you have with your health care  provider.  Hypertension Hypertension, commonly called high blood pressure, is when the force of blood pumping through your arteries is too strong. Your arteries are the blood vessels that carry blood from your heart throughout your body. A blood pressure reading consists of a higher number over a lower number, such as 110/72. The higher number (systolic) is the pressure inside your arteries when your heart pumps. The lower number (diastolic) is the pressure inside your arteries when your heart relaxes. Ideally you want your blood pressure below 120/80. Hypertension forces your heart to work harder to pump blood. Your arteries may become narrow or stiff. Having hypertension puts you at risk for heart disease, stroke, and other problems.  RISK FACTORS Some risk factors for high blood pressure are controllable. Others are not.  Risk factors you cannot control include:   Race. You may be at higher risk if you are African American.  Age. Risk increases with age.  Gender. Men are at higher risk than women before age 75 years. After age 29, women are at higher risk than men. Risk factors you can control include:  Not getting enough exercise or physical activity.  Being overweight.  Getting too much fat, sugar,  calories, or salt in your diet.  Drinking too much alcohol. SIGNS AND SYMPTOMS Hypertension does not usually cause signs or symptoms. Extremely high blood pressure (hypertensive crisis) may cause headache, anxiety, shortness of breath, and nosebleed. DIAGNOSIS  To check if you have hypertension, your health care provider will measure your blood pressure while you are seated, with your arm held at the level of your heart. It should be measured at least twice using the same arm. Certain conditions can cause a difference in blood pressure between your right and left arms. A blood pressure reading that is higher than normal on one occasion does not mean that you need treatment. If one blood  pressure reading is high, ask your health care provider about having it checked again. TREATMENT  Treating high blood pressure includes making lifestyle changes and possibly taking medicine. Living a healthy lifestyle can help lower high blood pressure. You may need to change some of your habits. Lifestyle changes may include:  Following the DASH diet. This diet is high in fruits, vegetables, and whole grains. It is low in salt, red meat, and added sugars.  Getting at least 2 hours of brisk physical activity every week.  Losing weight if necessary.  Not smoking.  Limiting alcoholic beverages.  Learning ways to reduce stress. If lifestyle changes are not enough to get your blood pressure under control, your health care provider may prescribe medicine. You may need to take more than one. Work closely with your health care provider to understand the risks and benefits. HOME CARE INSTRUCTIONS  Have your blood pressure rechecked as directed by your health care provider.   Take medicines only as directed by your health care provider. Follow the directions carefully. Blood pressure medicines must be taken as prescribed. The medicine does not work as well when you skip doses. Skipping doses also puts you at risk for problems.   Do not smoke.   Monitor your blood pressure at home as directed by your health care provider. SEEK MEDICAL CARE IF:   You think you are having a reaction to medicines taken.  You have recurrent headaches or feel dizzy.  You have swelling in your ankles.  You have trouble with your vision. SEEK IMMEDIATE MEDICAL CARE IF:  You develop a severe headache or confusion.  You have unusual weakness, numbness, or feel faint.  You have severe chest or abdominal pain.  You vomit repeatedly.  You have trouble breathing. MAKE SURE YOU:   Understand these instructions.  Will watch your condition.  Will get help right away if you are not doing well or get  worse. Document Released: 12/16/2005 Document Revised: 05/02/2014 Document Reviewed: 10/08/2013 [facility] Patient Information 2015 [place]. This information is not intended to replace advice given to you by your health care provider. Make sure you discuss any questions you have with your health care provider.  Discharge Against Medical Advice I am signing this paper to show that I am leaving this hospital or health care center of my own free will. It is done against all medical advice. In doing so, I am releasing this hospital or health care center and the attending physicians from any and all claims that I may want to make. I understand that further care has been recommended. My condition may worsen. This could cause me further bodily injury, illness, or even death. I do know that the medical staff has fully explained to me the risk that I am taking in leaving against medical  advice. Document Released: 12/16/2005 Document Revised: 03/09/2012 Document Reviewed: 06/02/2007 [facility] [place] Patient Information 2015 [place], [place]. This information is not intended to replace advice given to you by your health care provider. Make sure you discuss any questions you have with your health care provider.

## 2015-04-13 NOTE — ED Notes (Signed)
Pt states that he did not have a seizure today and that his last one was a week ago.  States that he is here for his legs swelling and hurting.  Pt is alert and oriented.

## 2015-04-13 NOTE — ED Notes (Signed)
Pt. Refusing lab work. Pt. Stating that he wants to leave.

## 2015-04-13 NOTE — ED Provider Notes (Signed)
TIME SEEN: 6:00 PM  CHIEF COMPLAINT: Possible seizure  HPI: Pt is a 49 y.o. male with history of hypertension, hyperlipidemia, alcohol and cocaine abuse, history of seizures on Dilantin with history of medical noncompliance who presents emergency department with possible seizure today. Patient was found by his roommate confused and appeared postictal. He is complaining of tongue pain and bilateral foot pain today. He denies that he had a seizure today. He reports he has not been taking his Dilantin as he does not feel he needs it.  Denies headache, vision changes, chest pain or shortness of breath. No recent fevers, cough, vomiting or diarrhea. Reports his last alcoholic drink was yesterday. Denies drug use. He reports last seizure was one week ago.   Patient has pitting edema to his lower extremities and he reports no present for 4 days with some sloughing of skin. He denies chest pain or shortness of breath. Not on diuretic. Denies known history of CHF.  ROS: See HPI Constitutional: no fever  Eyes: no drainage  ENT: no runny nose   Cardiovascular:  no chest pain  Resp: no SOB  GI: no vomiting GU: no dysuria Integumentary: no rash  Allergy: no hives  Musculoskeletal: no leg swelling  Neurological: no slurred speech ROS otherwise negative  PAST MEDICAL HISTORY/PAST SURGICAL HISTORY:  Past Medical History  Diagnosis Date  . Pancreatitis, acute     First episode earlier in 2012, hospitalized again in 02/2012 and 09/2012  . Hypertension     noncompliance  . Hyperlipidemia     noncompliance  . Lung collapse     h/o  . ETOH abuse   . Cocaine abuse   . DTs (delirium tremens)     history of  . Tobacco abuse   . Seizure disorder 07/17/2013  . Noncompliance 07/17/2013  . Traumatic subdural hematoma 06/15/2013  . 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 Open skull fracture 06/15/2013  . Polysubstance abuse     etoh, cocaine  . Seizures   . Drug-seeking behavior   . Diabetes  mellitus without complication   . Nephrolithiasis     MEDICATIONS:  Prior to Admission medications   Medication Sig Start Date End Date Taking? Authorizing Provider  feeding supplement, GLUCERNA SHAKE, (GLUCERNA SHAKE) LIQD Take 237 mLs by mouth 3 (three) times daily between meals. 02/20/15   Gwenyth BenderKaren M Black, NP  folic acid (FOLVITE) 1 MG tablet Take 1 tablet (1 mg total) by mouth daily. 02/20/15   Gwenyth BenderKaren M Black, NP  glipiZIDE (GLUCOTROL) 5 MG tablet Take 0.5 tablets (2.5 mg total) by mouth daily before breakfast. 02/21/15   Gwenyth BenderKaren M Black, NP  phenytoin (DILANTIN) 100 MG ER capsule Take 1 capsule (100 mg total) by mouth 3 (three) times daily. 02/21/15   Gwenyth BenderKaren M Black, NP  thiamine 100 MG tablet Take 1 tablet (100 mg total) by mouth daily. 02/20/15   Gwenyth BenderKaren M Black, NP    ALLERGIES:  No Known Allergies  SOCIAL HISTORY:  History  Substance Use Topics  . Smoking status: Current Every Day Smoker -- 1.00 packs/day for 20 years    Types: Cigarettes  . Smokeless tobacco: Current User  . Alcohol Use: Yes    FAMILY HISTORY: Family History  Problem Relation Age of Onset  . Diabetes type II Mother   . Liver disease Neg Hx   . Colon cancer Neg Hx   . GI problems Neg Hx     EXAM: BP 144/118 mmHg  Pulse 101  Temp(Src)  98.3 F (36.8 C) (Oral)  Resp 18  Ht 6' (1.829 m)  Wt 157 lb (71.215 kg)  BMI 21.29 kg/m2  SpO2 97% CONSTITUTIONAL: Alert and oriented 3 and responds appropriately to questions. Well-appearing; well-nourished, in no distress, nontoxic HEAD: Normocephalic EYES: Conjunctivae clear, PERRL ENT: normal nose; no rhinorrhea; moist mucous membranes; pharynx without lesions noted, no dental injury the patient does have an abrasion to the left aspect of his tongue that appears new, no significant tongue laceration NECK: Supple, no meningismus, no LAD; no midline spinal stenosis or step-off or deformity CARD: RRR; S1 and S2 appreciated; no murmurs, no clicks, no rubs, no  gallops RESP: Normal chest excursion without splinting or tachypnea; breath sounds clear and equal bilaterally; no wheezes, no rhonchi, no rales, chest wall nontender to palpation ABD/GI: Normal bowel sounds; non-distended; soft, non-tender, no rebound, no guarding BACK:  The back appears normal and is non-tender to palpation, there is no CVA tenderness; no midline spinal tinnitus or step-off or deformity EXT: Normal ROM in all joints; non-tender to palpation; pitting edema to the midcalf bilaterally; he has multiple areas of skin sloughing to his bilateral lower extremities and dry and peeling skin to his feet but no surrounding erythema or warmth or induration or fluctuance, 2+ DP pulses bilaterally, normal capillary refill; no cyanosis    SKIN: Normal color for age and race; warm NEURO: Moves all extremities equally, no pronator drift, sensation to light touch intact diffusely, cranial nerves II-12 intact PSYCH: The patient's mood and manner are appropriate. Grooming and personal hygiene are appropriate.  MEDICAL DECISION MAKING: Patient here with possible seizure today. Was reportedly altered at home but is now oriented 3, neurologically intact. He does have a tongue abrasion that appears new. No other sign of trauma on exam. Has swelling and sloughing of skin his lower extremities. Denies chest pain or short is of breath. We'll give IV diuretics. We'll give one Vicodin tablet for his pain. We'll obtain labs, urine. He is also hypertensive but asymptomatic otherwise. His history of noncompliance. Will give dose of IV labetalol.  ED PROGRESS: Patient's blood pressure continues to be elevated. His labs show bicarbonate 16 with a normal glucose of 101. Anion gap is normal at 11. He denies recent vomiting or diarrhea. His chloride is mildly elevated at 117. Will check ethanol level, lactate, salicylate level. His phenytoin level is undetectable. Will give dose of Dilantin in the ED. We'll give IV fluids  and repeat chemistry after patient has been hydrated. Unclear etiology for patient's metabolic acidosis.  9:00 PM  Pt refusing further blood work. Discussed with him his critical low bicarbonate level, his significantly elevated blood pressure that has not improved after labetalol and that I recommend admission to the hospital. He refuses this. Discussed with patient that this could cause life-threatening injury, death. He is oriented, does not appear intoxicated and is able to verbalize risks back to me. He will sign out AGAINST MEDICAL ADVICE.   EKG Interpretation  Date/Time:  Thursday April 13 2015 18:55:02 EDT Ventricular Rate:  110 PR Interval:  135 QRS Duration: 83 QT Interval:  373 QTC Calculation: 505 R Axis:   33 Text Interpretation:  Sinus tachycardia Borderline T abnormalities, inferior leads Prolonged QT interval No significant change since last tracing except rate is slower Confirmed by WARD,  DO, KRISTEN [phone]) on 04/13/2015 7:09:47 PM        Layla Maw Ward, DO 04/13/15 2056

## 2015-04-27 ENCOUNTER — Emergency Department (HOSPITAL_COMMUNITY)
Admission: EM | Admit: 2015-04-27 | Discharge: 2015-04-27 | Disposition: A | Discharge status: Home / Self Care | Payer: Self-pay | Attending: Emergency Medicine | Admitting: Emergency Medicine

## 2015-04-27 ENCOUNTER — Encounter (HOSPITAL_COMMUNITY): Payer: Self-pay

## 2015-04-27 DIAGNOSIS — I10 Essential (primary) hypertension: Secondary | ICD-10-CM | POA: Insufficient documentation

## 2015-04-27 DIAGNOSIS — Z8659 Personal history of other mental and behavioral disorders: Secondary | ICD-10-CM | POA: Insufficient documentation

## 2015-04-27 DIAGNOSIS — Z8719 Personal history of other diseases of the digestive system: Secondary | ICD-10-CM | POA: Insufficient documentation

## 2015-04-27 DIAGNOSIS — Z79899 Other long term (current) drug therapy: Secondary | ICD-10-CM | POA: Insufficient documentation

## 2015-04-27 DIAGNOSIS — E119 Type 2 diabetes mellitus without complications: Secondary | ICD-10-CM | POA: Insufficient documentation

## 2015-04-27 DIAGNOSIS — Z87828 Personal history of other (healed) physical injury and trauma: Secondary | ICD-10-CM | POA: Insufficient documentation

## 2015-04-27 DIAGNOSIS — Z720 Tobacco use: Secondary | ICD-10-CM | POA: Insufficient documentation

## 2015-04-27 DIAGNOSIS — R110 Nausea: Secondary | ICD-10-CM | POA: Insufficient documentation

## 2015-04-27 DIAGNOSIS — Z87442 Personal history of urinary calculi: Secondary | ICD-10-CM | POA: Insufficient documentation

## 2015-04-27 DIAGNOSIS — Z8781 Personal history of (healed) traumatic fracture: Secondary | ICD-10-CM | POA: Insufficient documentation

## 2015-04-27 DIAGNOSIS — G40909 Epilepsy, unspecified, not intractable, without status epilepticus: Secondary | ICD-10-CM | POA: Insufficient documentation

## 2015-04-27 DIAGNOSIS — E876 Hypokalemia: Secondary | ICD-10-CM | POA: Insufficient documentation

## 2015-04-27 LAB — BASIC METABOLIC PANEL
Anion gap: 5 (ref 5–15)
CALCIUM: 7.5 mg/dL — AB (ref 8.4–10.5)
CO2: 16 mmol/L — ABNORMAL LOW (ref 19–32)
CREATININE: 0.58 mg/dL (ref 0.50–1.35)
Chloride: 120 mmol/L — ABNORMAL HIGH (ref 96–112)
GFR calc Af Amer: >90 mL/min (ref >90)
GFR calc non Af Amer: >90 mL/min (ref >90)
Glucose, Bld: 83 mg/dL (ref 70–99)
Potassium: 2.7 mmol/L — CL (ref 3.5–5.1)
Sodium: 141 mmol/L (ref 135–145)

## 2015-04-27 LAB — CBC WITH DIFFERENTIAL/PLATELET
Basophils Absolute: 0 10*3/uL (ref 0.0–0.1)
Basophils Relative: 0 % (ref 0–1)
Eosinophils Absolute: 0.1 10*3/uL (ref 0.0–0.7)
Eosinophils Relative: 1 % (ref 0–5)
HCT: 30.3 % — ABNORMAL LOW (ref 39.0–52.0)
Hemoglobin: 10.3 g/dL — ABNORMAL LOW (ref 13.0–17.0)
LYMPHS ABS: 1.7 10*3/uL (ref 0.7–4.0)
LYMPHS PCT: 34 % (ref 12–46)
MCH: 31.4 pg (ref 26.0–34.0)
MCHC: 34 g/dL (ref 30.0–36.0)
MCV: 92.4 fL (ref 78.0–100.0)
Monocytes Absolute: 0.3 10*3/uL (ref 0.1–1.0)
Monocytes Relative: 7 % (ref 3–12)
NEUTROS ABS: 2.8 10*3/uL (ref 1.7–7.7)
NEUTROS PCT: 58 % (ref 43–77)
PLATELETS: 176 10*3/uL (ref 150–400)
RBC: 3.28 MIL/uL — ABNORMAL LOW (ref 4.22–5.81)
RDW: 15 % (ref 11.5–15.5)
WBC: 4.8 10*3/uL (ref 4.0–10.5)

## 2015-04-27 MED ORDER — LORAZEPAM 1 MG PO TABS
1.0000 mg | ORAL_TABLET | Freq: Once | ORAL | Status: AC
  Administered 2015-04-27: 1 mg via ORAL
  Filled 2015-04-27: qty 1

## 2015-04-27 MED ORDER — PHENYTOIN SODIUM EXTENDED 100 MG PO CAPS
300.0000 mg | ORAL_CAPSULE | Freq: Once | ORAL | Status: AC
  Administered 2015-04-27: 300 mg via ORAL
  Filled 2015-04-27: qty 3

## 2015-04-27 MED ORDER — POTASSIUM CHLORIDE CRYS ER 20 MEQ PO TBCR
40.0000 meq | EXTENDED_RELEASE_TABLET | Freq: Once | ORAL | Status: AC
  Administered 2015-04-27: 40 meq via ORAL
  Filled 2015-04-27: qty 2

## 2015-04-27 MED ORDER — PHENYTOIN SODIUM EXTENDED 100 MG PO CAPS: 100.0000 mg | ORAL_CAPSULE | Freq: Three times a day (TID) | ORAL | Status: DC

## 2015-04-27 MED ORDER — POTASSIUM CHLORIDE ER 10 MEQ PO TBCR: 10.0000 meq | EXTENDED_RELEASE_TABLET | Freq: Two times a day (BID) | ORAL | Status: DC

## 2015-04-27 NOTE — ED Notes (Signed)
Pt reports dizziness and nausea since this mornign.  Reports history of seizures and says usually feels like this before he has one.

## 2015-04-27 NOTE — ED Notes (Signed)
CRITICAL VALUE ALERT  Critical value received:  Potassium 2.7  Date of notification:  04/27/15  Time of notification:  1525  Critical value read back:yes  Nurse who received alert: j. Bing QuarryYoung rn  MD notified (1st page): [email or web address]  Time of first page:  1540  MD notified (2nd page):  Time of second page:  Responding MD: zackowski  Time MD responded:  1540

## 2015-04-27 NOTE — Discharge Instructions (Signed)
Take the potassium supplements as directed twice a day for a week. Take your Dilantin medication 1 tablet 3 times a day. Follow-up with the health department resource guide provided. Blood pressure high here you may need to have treatment for high blood pressure. Needs to be checked on other days to confirm, health department would be a perfect place to follow-up.    Emergency Department Resource Guide 1) Find a Doctor and Pay Out of Pocket Although you won't have to find out who is covered by your insurance plan, it is a good idea to ask around and get recommendations. You will then need to call the office and see if the doctor you have chosen will accept you as a new patient and what types of options they offer for patients who are self-pay. Some doctors offer discounts or will set up payment plans for their patients who do not have insurance, but you will need to ask so you aren't surprised when you get to your appointment.  2) Contact Your Local Health Department Not all health departments have doctors that can see patients for sick visits, but many do, so it is worth a call to see if yours does. If you don't know where your local health department is, you can check in your phone book. The CDC also has a tool to help you locate your state's health department, and many state websites also have listings of all of their local health departments.  3) Find a Walk-in Clinic If your illness is not likely to be very severe or complicated, you may want to try a walk in clinic. These are popping up all over the country in pharmacies, drugstores, and shopping centers. They're usually staffed by nurse practitioners or physician assistants that have been trained to treat common illnesses and complaints. They're usually fairly quick and inexpensive. However, if you have serious medical issues or chronic medical problems, these are probably not your best option.  No Primary Care Doctor: - Call Health Connect at   [phone] - they can help you locate a primary care doctor that  accepts your insurance, provides certain services, etc. - Physician Referral Service- [phone]  Chronic Pain Problems: Organization         Address  Phone   Notes  [facility]  [phone] Patients need to be referred by their primary care doctor.   Medication Assistance: Organization         Address  Phone   Notes  [facility] [address] [place], [place] [address] [phone] --Must be a resident of [place] [facility] [place] -- Must have NO insurance coverage whatsoever (no Medicaid/ Medicare, etc.) -- The pt. MUST have a primary care doctor that directs their care regularly and follows them in the community   MedAssist  [phone]   [employer]  [phone]    Agencies that provide inexpensive medical care: Organization         Address  Phone   Notes  [facility]  [phone]   [facility]    [phone]   [facility] [address] [place], [place] [address] [phone]) [phone]   [facility] [address], [place] [phone]   [facility]anned Parenthood    [phone]   [facility]    [phone]) [phone]   [facility]  [address], [place]  Phone:  [phone], Fax:  [phone] Hours of Operation:  9 am - 6 pm, M-F.  Also accepts Medicaid/Medicare and self-pay.  [facility] for [place], [address], [place] Phone: [phone], Fax: [phone]. Hours of Operation:  8:30 am - 5:30 pm, M-F.  Also accepts Medicaid and self-pay.  [facility] [place] [address] [place] Phone: [phone]   [place], [place], [place] [phone], Ext. [phone] Mondays & Thursdays: 7-9 AM.  First 15 patients are seen on a first come, first serve basis.     Christine Providers:  Organization         Address  Phone   Notes  [facility] [address], [place] [phone] Also accepts self-pay patients.  [facility] [id number] [place], [place]  [phone]   [place], [address], [place] [phone]   [facility] [address], [place] [phone]   Lucianne Lei [address], [place]   [phone] Only accepts [place] Access [place] patients after they have their name applied to their card.   Self-Pay (no insurance) in [facility]:  Organization         Address  Phone   Notes  Sickle Cell Patients, [facility] [place] [phone]   [facility] [place] [phone]   [facility] [place]  [place], [place],  [phone]   [facility]/Dr. Osei-Bonsu  [address] [place] or [address], [place] [phone] Phone number for both [place] and [place] locations is the same.  [facility]gent [facility] and [facility] [address] [place] [phone]   [facility] [address] [place] or [address] [phone]   [facility] [address] [place] [phone], phone; [phone], fax Sees patients 1st and 3rd Saturday of every month.  Must not qualify for public or private insurance (i.e. Medicaid, Medicare, [place] Health Choice, Veterans' Benefits)  Household income should be no more than 200% of the poverty level The clinic cannot treat you if you are pregnant or think you are pregnant  Sexually transmitted diseases are not treated at the clinic.    Dental Care: Organization         Address  Phone  Notes  [facility]  Department of [facility] [place] [phone] Accepts children up to age 25 who are enrolled in [place] or [place]; pregnant women with a Medicaid card; and children who have applied for Medicaid or [place] Health Choice, but were declined, whose parents can pay a reduced fee at time of service.  [facility] Department of [facility]  [address], [place] [phone] Accepts children up to age 79 who are enrolled in [place] or [place]; pregnant women with a Medicaid card; and children who have applied for Medicaid or [place] Health Choice, but were declined, whose parents can pay a reduced fee at time of service.  [facility]  [place] [phone] Patients are seen by appointment  only. Walk-ins are not accepted. [place] will see patients 33 years of age and older. Monday - Tuesday (8am-5pm) Most Wednesdays (8:30-5pm) $30 per visit, cash only  [facility]  [address], [facility] [phone] Patients are seen by appointment only. Walk-ins are not accepted. [place] will see patients 12 years of age and older. One Wednesday Evening (Monthly: Volunteer Based).  $30 per visit, cash only  [place]  [phone] for adults; Children under age 69, call Graduate Pediatric Dentistry at [phone]. Children aged 51-14, please call [phone] to request a pediatric application.  Dental services are provided in all areas of dental care including fillings, crowns and bridges, complete and partial dentures, implants, gum treatment, root canals, and extractions. Preventive care is also provided. Treatment is provided to both adults and children. Patients are selected via a lottery and there is often a waiting list.   [facility] [address], [place]  [phone]  [email or web address]   [facility] [address] [place], [place] [phone], Ext. [phone] Second and Fourth Thursday of each month, opens at 6:30 AM; Clinic ends at 9 AM.  Patients are seen on a first-come first-served basis, and a limited number are seen during each clinic.   [facility]  [address] Hillard Danker [place], [place] [phone]   Eligibility Requirements You must have lived in [place], [place], or [place] counties for at least the last three months.   You cannot be eligible for state or federal sponsored [employer], including [employer], [place], or [employer].   You generally cannot be eligible for healthcare insurance through your employer.    How to apply: Eligibility screenings are held every Tuesday and Wednesday afternoon from 1:00 pm until 4:00 pm. You do not need an appointment for the interview!  [facility] [address] [place], [place]   [place]  [place] Department  [place]  [phone]    Behavioral Health Resources in the Community: Intensive Outpatient Programs Organization         Address  Phone  Notes  [place]. [address], [place], [place] [phone]   [facility] [address] [place], [place]   ADS: Alcohol & Drug Svcs [address] [place], [place]   [place] [address],  [place], [place] or [phone]   Substance Abuse Resources Organization         Address  Phone  Notes  Alcohol and Drug Services  [phone]   [place]  [phone]   The [place]   Chinita Pester  [phone]   Residential & Outpatient Substance Abuse Program  [phone]   Psychological Services Organization          Address  Phone  Notes  [facility] [place]  [place]  [phone]   [place] [address] [place] [phone] or [phone]    Mobile Crisis Teams Organization         Address  Phone  Notes  [facility], Mobile Crisis Care Unit  [phone]   [facility] Psychotherapeutic Services  [address] [place], [place]   [facility] [address], [place] [phone]  Self-Help/Support Groups Organization         Address  Phone             Notes  [facility] [place] - variety of support groups  [place] Call for more information  Narcotics Anonymous (NA), [facility]aring [facility] [address], [employer] [place]  2 meetings at this location   Special educational needs teacher         Address  Phone  Notes  [facility] Residential Treatment [place],    [place]  [phone]   [facility]  [address] [place] [phone], [place], [place]   [place], [place] [phone] Admissions: 8am-3pm M-F  Incentives Substance [place] [address]    [place], [place] [phone]   [facility] [address] [place], [place], [place]   [facility] [address]  [place], [place]   [facility] - Intensive Outpatient [address] Kristeen [address], [place], [place]   [facility] [address]  [place], [place] [phone] or [phone]   [facility] Services (RTS) [address] [place], [place] Accepts Medicaid  [facility] [place] [address]  [place] [phone] Substance Abuse/Addiction Treatment   [facility] Organization         Address  Phone  Notes  [facility]  [phone]   Domenic Schwab, PhD [address] Arlis Porta [place], [place]   [phone] or [phone]   [place], [place] [phone]   [facility] [address] [place], [place] [phone] Insurance/Medicaid/sponsorship through [facility] and Families [address], [address]                                    [place], [place] [phone] [place] [address], [place] [phone]    Dr. Adele Schilder  [phone]   [facility] Dept. 1) [address]  2) [place] 3)  [address], [place] [phone]  [phone]   [place] [phone] or [phone] (After Hours)

## 2015-04-27 NOTE — ED Provider Notes (Signed)
CSN: [id number]     Arrival date & time 04/27/15  1126 History   First MD Initiated Contact with Patient 04/27/15 1223     Chief Complaint  Patient presents with  . Nausea  . Dizziness     (Consider location/radiation/quality/duration/timing/severity/associated sxs/prior Treatment) Patient is a 49 y.o. male presenting with dizziness. The history is provided by the patient.  Dizziness Associated symptoms: nausea   Associated symptoms: no chest pain, no headaches, no shortness of breath and no vomiting    the patient here for concerns of dizziness no vertigo and nausea. States this feels before he gets a seizure. Been out of his seizure meds now for several weeks to months. Out of all meds. Patient normally gets his meds are renewed in the emergency department does not followed by the health department or any where else. Does have a history of significant alcohol abuse. No active seizure. No other complaints.  Past Medical History  Diagnosis Date  . Pancreatitis, acute     First episode earlier in 2012, hospitalized again in 02/2012 and 09/2012  . Hypertension     noncompliance  . Hyperlipidemia     noncompliance  . Lung collapse     h/o  . ETOH abuse   . Cocaine abuse   . DTs (delirium tremens)     history of  . Tobacco abuse   . Seizure disorder 07/17/2013  . Noncompliance 07/17/2013  . Traumatic subdural hematoma 06/15/2013  . 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 Open skull fracture 06/15/2013  . Polysubstance abuse     etoh, cocaine  . Seizures   . Drug-seeking behavior   . Diabetes mellitus without complication   . Nephrolithiasis    Past Surgical History  Procedure Laterality Date  . Mandible fracture surgery  2006  . Left axillary      surgery for deep laceration   Family History  Problem Relation Age of Onset  . Diabetes type II Mother   . Liver disease Neg Hx   . Colon cancer Neg Hx   . GI problems Neg Hx    History  Substance Use Topics  .  Smoking status: Current Every Day Smoker -- 1.00 packs/day for 20 years    Types: Cigarettes  . Smokeless tobacco: Current User  . Alcohol Use: Yes     Comment: occ    Review of Systems  Constitutional: Negative for fever.  HENT: Negative for congestion.   Eyes: Negative for visual disturbance.  Respiratory: Negative for shortness of breath.   Cardiovascular: Negative for chest pain.  Gastrointestinal: Positive for nausea. Negative for vomiting and abdominal pain.  Musculoskeletal: Negative for back pain and neck pain.  Skin: Negative for rash.  Neurological: Positive for dizziness. Negative for seizures and headaches.  Hematological: Does not bruise/bleed easily.  Psychiatric/Behavioral: Negative for confusion.      Allergies  Review of patient's allergies indicates no known allergies.  Home Medications   Prior to Admission medications   Medication Sig Start Date End Date Taking? Authorizing Provider  phenytoin (DILANTIN) 100 MG ER capsule Take 1 capsule (100 mg total) by mouth 3 (three) times daily. Patient taking differently: Take 300 mg by mouth at bedtime.  02/21/15  Yes Lesle ChrisKaren M Black, NP  feeding supplement, GLUCERNA SHAKE, (GLUCERNA SHAKE) LIQD Take 237 mLs by mouth 3 (three) times daily between meals. Patient not taking: Reported on 04/13/2015 02/20/15   Gwenyth BenderKaren M Black, NP  folic acid (FOLVITE) 1 MG tablet Take 1  tablet (1 mg total) by mouth daily. Patient not taking: Reported on 04/13/2015 02/20/15   Gwenyth Bender, NP  glipiZIDE (GLUCOTROL) 5 MG tablet Take 0.5 tablets (2.5 mg total) by mouth daily before breakfast. Patient not taking: Reported on 04/13/2015 02/21/15   Gwenyth Bender, NP  phenytoin (DILANTIN) 100 MG ER capsule Take 1 capsule (100 mg total) by mouth 3 (three) times daily. 04/27/15   Vanetta Mulders, MD  potassium chloride (K-DUR) 10 MEQ tablet Take 1 tablet (10 mEq total) by mouth 2 (two) times daily. 04/27/15   Vanetta Mulders, MD  thiamine 100 MG tablet Take 1  tablet (100 mg total) by mouth daily. Patient not taking: Reported on 04/13/2015 02/20/15   Lesle Chris Black, NP   BP 162/126 mmHg  Pulse 84  Temp(Src) 98 F (36.7 C) (Oral)  Resp 18  Ht 6' (1.829 m)  Wt 157 lb (71.215 kg)  BMI 21.29 kg/m2  SpO2 99% Physical Exam  Constitutional: He is oriented to person, place, and time. He appears well-developed and well-nourished. No distress.  HENT:  Head: Normocephalic and atraumatic.  Mouth/Throat: Oropharynx is clear and moist.  Eyes: Conjunctivae and EOM are normal. Pupils are equal, round, and reactive to light.  Neck: Normal range of motion.  Cardiovascular: Normal rate and normal heart sounds.   No murmur heard. Pulmonary/Chest: Effort normal and breath sounds normal. No respiratory distress.  Abdominal: Soft. Bowel sounds are normal. There is no tenderness.  Musculoskeletal: Normal range of motion. He exhibits no edema.  Neurological: He is alert and oriented to person, place, and time. No cranial nerve deficit. He exhibits normal muscle tone. Coordination normal.  Skin: Skin is warm. No rash noted.  Nursing note and vitals reviewed.   ED Course  Procedures (including critical care time) Labs Review Labs Reviewed  CBC WITH DIFFERENTIAL/PLATELET - Abnormal; Notable for the following:    RBC 3.28 (*)    Hemoglobin 10.3 (*)    HCT 30.3 (*)    All other components within normal limits  BASIC METABOLIC PANEL - Abnormal; Notable for the following:    Potassium 2.7 (*)    Chloride 120 (*)    CO2 16 (*)    BUN <5 (*)    Calcium 7.5 (*)    All other components within normal limits    Imaging Review No results found.   EKG Interpretation   Date/Time:  Thursday April 27 2015 11:39:56 EDT Ventricular Rate:  92 PR Interval:  113 QRS Duration: 92 QT Interval:  416 QTC Calculation: 515 R Axis:   29 Text Interpretation:  Sinus rhythm Borderline short PR interval Prolonged  QT interval Baseline wander in lead(s) V1 Confirmed by COOK   MD, BRIAN  [phone]) on 04/27/2015 12:54:40 PM      MDM   Final diagnoses:  Hypokalemia  Essential hypertension  Seizure disorder    Patient with history of alcohol abuse. Was here April 14 wanting meds renewed. Patient feeling like he is going to have a seizure but has not. Patient's been out of his Dilantin and all meds for several weeks. Currently is not followed by health department and does not have a primary care doctor. Patient states that he gets his meds usually renewed through the emergency department.  Today's workup showed hypokalemia but patient in no acute distress from that. In addition blood pressure has been elevated and probably has hypertension but has not been on hypertensive meds before. Patient given Ativan for the seizure  feeling which seemed to help. Patient's Dilantin will be renewed and also given a 300 mg bolus orally here. Patient also given 40 mEq of potassium. Patient given resource guide to follow-up the health department about the high blood pressure.  In addition patient had the diagnosis of diabetes however blood sugars are well controlled here will not renew his oral hypoglycemic medication.  Renal function is normal on the labs potassium was 2.7. In addition CO2 was low at 16 no significant anemia. Although patient clearly has a form of acidosis patient not in any acute distress and does not require admission. Patient would refuse admission anyways.  Vanetta Mulders, MD 04/27/15 [phone]

## 2015-05-12 ENCOUNTER — Inpatient Hospital Stay (HOSPITAL_COMMUNITY)
Admission: EM | Admit: 2015-05-12 | Discharge: 2015-05-14 | DRG: 100 | Disposition: A | Discharge status: Home / Self Care | Payer: Medicaid Other | Attending: Internal Medicine | Admitting: Internal Medicine

## 2015-05-12 ENCOUNTER — Encounter (HOSPITAL_COMMUNITY): Payer: Self-pay

## 2015-05-12 ENCOUNTER — Emergency Department (HOSPITAL_COMMUNITY): Payer: Medicaid Other

## 2015-05-12 DIAGNOSIS — Z9114 Patient's other noncompliance with medication regimen: Secondary | ICD-10-CM

## 2015-05-12 DIAGNOSIS — Z87442 Personal history of urinary calculi: Secondary | ICD-10-CM

## 2015-05-12 DIAGNOSIS — S0990XA Unspecified injury of head, initial encounter: Secondary | ICD-10-CM

## 2015-05-12 DIAGNOSIS — E785 Hyperlipidemia, unspecified: Secondary | ICD-10-CM | POA: Diagnosis present

## 2015-05-12 DIAGNOSIS — E1100 Type 2 diabetes mellitus with hyperosmolarity without nonketotic hyperglycemic-hyperosmolar coma (NKHHC): Secondary | ICD-10-CM | POA: Diagnosis present

## 2015-05-12 DIAGNOSIS — F10929 Alcohol use, unspecified with intoxication, unspecified: Secondary | ICD-10-CM | POA: Diagnosis present

## 2015-05-12 DIAGNOSIS — Z720 Tobacco use: Secondary | ICD-10-CM | POA: Diagnosis present

## 2015-05-12 DIAGNOSIS — Z9119 Patient's noncompliance with other medical treatment and regimen: Secondary | ICD-10-CM

## 2015-05-12 DIAGNOSIS — R270 Ataxia, unspecified: Secondary | ICD-10-CM

## 2015-05-12 DIAGNOSIS — F10129 Alcohol abuse with intoxication, unspecified: Secondary | ICD-10-CM | POA: Diagnosis present

## 2015-05-12 DIAGNOSIS — I10 Essential (primary) hypertension: Secondary | ICD-10-CM | POA: Diagnosis present

## 2015-05-12 DIAGNOSIS — F1010 Alcohol abuse, uncomplicated: Secondary | ICD-10-CM

## 2015-05-12 DIAGNOSIS — F17210 Nicotine dependence, cigarettes, uncomplicated: Secondary | ICD-10-CM | POA: Diagnosis present

## 2015-05-12 DIAGNOSIS — Y92512 Supermarket, store or market as the place of occurrence of the external cause: Secondary | ICD-10-CM

## 2015-05-12 DIAGNOSIS — W19XXXA Unspecified fall, initial encounter: Secondary | ICD-10-CM | POA: Diagnosis present

## 2015-05-12 DIAGNOSIS — Z91199 Patient's noncompliance with other medical treatment and regimen due to unspecified reason: Secondary | ICD-10-CM

## 2015-05-12 DIAGNOSIS — Y906 Blood alcohol level of 120-199 mg/100 ml: Secondary | ICD-10-CM | POA: Diagnosis present

## 2015-05-12 DIAGNOSIS — R569 Unspecified convulsions: Principal | ICD-10-CM

## 2015-05-12 DIAGNOSIS — Z833 Family history of diabetes mellitus: Secondary | ICD-10-CM

## 2015-05-12 DIAGNOSIS — S0101XA Laceration without foreign body of scalp, initial encounter: Secondary | ICD-10-CM

## 2015-05-12 LAB — CBC WITH DIFFERENTIAL/PLATELET
BASOS ABS: 0 10*3/uL (ref 0.0–0.1)
BASOS PCT: 0 % (ref 0–1)
Eosinophils Absolute: 0.1 10*3/uL (ref 0.0–0.7)
Eosinophils Relative: 2 % (ref 0–5)
HCT: 34.2 % — ABNORMAL LOW (ref 39.0–52.0)
Hemoglobin: 11.4 g/dL — ABNORMAL LOW (ref 13.0–17.0)
Lymphocytes Relative: 50 % — ABNORMAL HIGH (ref 12–46)
Lymphs Abs: 3.2 10*3/uL (ref 0.7–4.0)
MCH: 30.2 pg (ref 26.0–34.0)
MCHC: 33.3 g/dL (ref 30.0–36.0)
MCV: 90.7 fL (ref 78.0–100.0)
Monocytes Absolute: 0.5 10*3/uL (ref 0.1–1.0)
Monocytes Relative: 8 % (ref 3–12)
NEUTROS PCT: 40 % — AB (ref 43–77)
Neutro Abs: 2.5 10*3/uL (ref 1.7–7.7)
PLATELETS: 233 10*3/uL (ref 150–400)
RBC: 3.77 MIL/uL — ABNORMAL LOW (ref 4.22–5.81)
RDW: 15.7 % — AB (ref 11.5–15.5)
WBC: 6.2 10*3/uL (ref 4.0–10.5)

## 2015-05-12 MED ORDER — SODIUM CHLORIDE 0.9 % IV BOLUS (SEPSIS): 500.0000 mL | Freq: Once | INTRAVENOUS | Status: DC

## 2015-05-12 NOTE — ED Notes (Signed)
Pt is intoxicated, brought in by ems from a convenience store after apparently falling and had questionable seizure.

## 2015-05-12 NOTE — ED Provider Notes (Signed)
CSN: [id number]     Arrival date & time 05/12/15  2209 History  This chart was scribed for Mancel Bale, MD by Karle Plumber, ED Scribe. This patient was seen in room APA19/APA19 and the patient's care was started at 10:30 PM.  Chief Complaint  Patient presents with  . Fall    LEVEL 5 CAVEAT- Full history could not be obtained due to AMS.  The history is provided by the patient and the EMS personnel.    HPI Comments:  Paul [name] is a 49 y.o. intoxicated male with PMHx of seizures, brought in by [employer], who presents to the Emergency Department complaining of a fall at a convenience store from a possible seizure that occurred PTA. He states he does not remember what happened. His head is wrapped by EMS due to a laceration sustained from the fall. Unsure of LOC. PMHx of pancreatitis, HTN, HLD, ETOH abuse, cocaine abuse, DM, nephrolithiasis and polysubstance abuse.  Past Medical History  Diagnosis Date  . Pancreatitis, acute     First episode earlier in 2012, hospitalized again in 02/2012 and 09/2012  . Hypertension     noncompliance  . Hyperlipidemia     noncompliance  . Lung collapse     h/o  . ETOH abuse   . Cocaine abuse   . DTs (delirium tremens)     history of  . Tobacco abuse   . Seizure disorder 07/17/2013  . Noncompliance 07/17/2013  . Traumatic subdural hematoma 06/15/2013  . 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 Open skull fracture 06/15/2013  . Polysubstance abuse     etoh, cocaine  . Seizures   . Drug-seeking behavior   . Diabetes mellitus without complication   . Nephrolithiasis    Past Surgical History  Procedure Laterality Date  . Mandible fracture surgery  2006  . Left axillary      surgery for deep laceration   Family History  Problem Relation Age of Onset  . Diabetes type II Mother   . Liver disease Neg Hx   . Colon cancer Neg Hx   . GI problems Neg Hx    History  Substance Use Topics  . Smoking status:  Current Every Day Smoker -- 1.00 packs/day for 20 years    Types: Cigarettes  . Smokeless tobacco: Current User  . Alcohol Use: Yes     Comment: occ    LEVEL 5 CAVEAT- Full history could not be obtained due to AMS.  Review of Systems  Unable to perform ROS   Allergies  Review of patient's allergies indicates no known allergies.  Home Medications   Prior to Admission medications   Medication Sig Start Date End Date Taking? Authorizing Provider  feeding supplement, GLUCERNA SHAKE, (GLUCERNA SHAKE) LIQD Take 237 mLs by mouth 3 (three) times daily between meals. Patient not taking: Reported on 04/13/2015 02/20/15   Gwenyth Bender, NP  folic acid (FOLVITE) 1 MG tablet Take 1 tablet (1 mg total) by mouth daily. Patient not taking: Reported on 04/13/2015 02/20/15   Gwenyth Bender, NP  glipiZIDE (GLUCOTROL) 5 MG tablet Take 0.5 tablets (2.5 mg total) by mouth daily before breakfast. Patient not taking: Reported on 04/13/2015 02/21/15   Gwenyth Bender, NP  phenytoin (DILANTIN) 100 MG ER capsule Take 1 capsule (100 mg total) by mouth 3 (three) times daily. Patient taking differently: Take 300 mg by mouth at bedtime.  02/21/15   Gwenyth Bender, NP  phenytoin (DILANTIN) 100 MG  ER capsule Take 1 capsule (100 mg total) by mouth 3 (three) times daily. 04/27/15   Vanetta MuldersScott Zackowski, MD  potassium chloride (K-DUR) 10 MEQ tablet Take 1 tablet (10 mEq total) by mouth 2 (two) times daily. 04/27/15   Vanetta MuldersScott Zackowski, MD  thiamine 100 MG tablet Take 1 tablet (100 mg total) by mouth daily. Patient not taking: Reported on 04/13/2015 02/20/15   Gwenyth BenderKaren M Black, NP   Triage Vitals: BP 124/104 mmHg  Pulse 82  Resp 22  Ht 6' (1.829 m)  Wt 157 lb (71.215 kg)  BMI 21.29 kg/m2  SpO2 100% Physical Exam  Constitutional: He appears well-developed and well-nourished.  HENT:  Head: Normocephalic.  Right Ear: External ear normal.  Left Ear: External ear normal.  1 inch superficial laceration of midparietal scalp. No associated  crepitus or deformity.  Eyes: Conjunctivae and EOM are normal. Pupils are equal, round, and reactive to light.  Neck: Normal range of motion and phonation normal. Neck supple.  Cardiovascular: Normal rate, regular rhythm and normal heart sounds.   Pulmonary/Chest: Effort normal and breath sounds normal. No respiratory distress. He exhibits no bony tenderness.  Abdominal: Soft. There is no tenderness.  Musculoskeletal: Normal range of motion.  No large joint deformities  Neurological: He is alert. No cranial nerve deficit or sensory deficit. He exhibits normal muscle tone. Coordination normal.  Dysarthric. No nystagmus. No apparent a aphasia.  Skin: Skin is warm, dry and intact.  Psychiatric:  Belligerent, pushes providers away.  Nursing note and vitals reviewed.   ED Course  Procedures (including critical care time) Shortly after arrival, the patient nearly completely removed himself from immobilization. There is no palpable step-off of the posterior cervical spine. There is no palpable tenderness, thoracic, lumbar spine. He moved all extremities easily. He was removed completely from the backboard and straps, by me.  DIAGNOSTIC STUDIES: Oxygen Saturation is 100% on RA, normal by my interpretation.   COORDINATION OF CARE: 10:36 PM- Will suture laceration and order fluids. Pt verbalizes understanding and agrees to plan.  Medications  sodium chloride 0.9 % bolus 500 mL (not administered)    Labs Review Labs Reviewed  BASIC METABOLIC PANEL - Abnormal; Notable for the following:    Potassium 3.3 (*)    Chloride 117 (*)    CO2 16 (*)    BUN <5 (*)    Creatinine, Ser 0.55 (*)    Calcium 7.5 (*)    All other components within normal limits  CBC WITH DIFFERENTIAL/PLATELET - Abnormal; Notable for the following:    RBC 3.77 (*)    Hemoglobin 11.4 (*)    HCT 34.2 (*)    RDW 15.7 (*)    Neutrophils Relative % 40 (*)    Lymphocytes Relative 50 (*)    All other components within  normal limits  ETHANOL  PHENYTOIN LEVEL, TOTAL    Imaging Review No results found.   EKG Interpretation None      MDM   Final diagnoses:  Fall, initial encounter  Head injury, initial encounter  Seizure  Scalp laceration, initial encounter    Fall with intoxication, and possible seizure.. Is unclear seizure activity was pre-or post fall. Clinically, he has only mild head injury, and scalp laceration.  Nursing Notes Reviewed/ Care Coordinated, and agree without changes. Applicable Imaging Reviewed.  Interpretation of Laboratory Data incorporated into ED treatment  Care to oncoming provider team to evaluate after imaging, and observe, until sober. Documented history of prior DTs.  I personally  performed the services described in this documentation, which was scribed in my presence. The recorded information has been reviewed and is accurate.    Mancel BaleElliott Marjean Imperato, MD 05/13/15 1444

## 2015-05-13 ENCOUNTER — Encounter (HOSPITAL_COMMUNITY): Payer: Self-pay | Admitting: General Practice

## 2015-05-13 DIAGNOSIS — F1010 Alcohol abuse, uncomplicated: Secondary | ICD-10-CM

## 2015-05-13 DIAGNOSIS — R569 Unspecified convulsions: Principal | ICD-10-CM

## 2015-05-13 DIAGNOSIS — E1101 Type 2 diabetes mellitus with hyperosmolarity with coma: Secondary | ICD-10-CM

## 2015-05-13 DIAGNOSIS — E1165 Type 2 diabetes mellitus with hyperglycemia: Secondary | ICD-10-CM

## 2015-05-13 DIAGNOSIS — F10929 Alcohol use, unspecified with intoxication, unspecified: Secondary | ICD-10-CM | POA: Diagnosis present

## 2015-05-13 DIAGNOSIS — I10 Essential (primary) hypertension: Secondary | ICD-10-CM

## 2015-05-13 DIAGNOSIS — R270 Ataxia, unspecified: Secondary | ICD-10-CM

## 2015-05-13 DIAGNOSIS — Z720 Tobacco use: Secondary | ICD-10-CM

## 2015-05-13 DIAGNOSIS — Z9119 Patient's noncompliance with other medical treatment and regimen: Secondary | ICD-10-CM

## 2015-05-13 LAB — PHENYTOIN LEVEL, TOTAL: Phenytoin Lvl: <2.5 ug/mL — ABNORMAL LOW (ref 10.0–20.0)

## 2015-05-13 LAB — GLUCOSE, CAPILLARY
GLUCOSE-CAPILLARY: 187 mg/dL — AB (ref 65–99)
Glucose-Capillary: 62 mg/dL — ABNORMAL LOW (ref 65–99)
Glucose-Capillary: 144 mg/dL — ABNORMAL HIGH (ref 65–99)
Glucose-Capillary: 54 mg/dL — ABNORMAL LOW (ref 65–99)

## 2015-05-13 LAB — CBC WITH DIFFERENTIAL/PLATELET
BASOS ABS: 0 10*3/uL (ref 0.0–0.1)
Basophils Relative: 0 % (ref 0–1)
EOS ABS: 0.1 10*3/uL (ref 0.0–0.7)
Eosinophils Relative: 1 % (ref 0–5)
HCT: 33.7 % — ABNORMAL LOW (ref 39.0–52.0)
HEMOGLOBIN: 11.2 g/dL — AB (ref 13.0–17.0)
LYMPHS PCT: 41 % (ref 12–46)
Lymphs Abs: 1.9 10*3/uL (ref 0.7–4.0)
MCH: 30.3 pg (ref 26.0–34.0)
MCHC: 33.2 g/dL (ref 30.0–36.0)
MCV: 91.1 fL (ref 78.0–100.0)
MONOS PCT: 7 % (ref 3–12)
Monocytes Absolute: 0.3 10*3/uL (ref 0.1–1.0)
Neutro Abs: 2.3 10*3/uL (ref 1.7–7.7)
Neutrophils Relative %: 51 % (ref 43–77)
PLATELETS: 232 10*3/uL (ref 150–400)
RBC: 3.7 MIL/uL — ABNORMAL LOW (ref 4.22–5.81)
RDW: 15.9 % — ABNORMAL HIGH (ref 11.5–15.5)
WBC: 4.6 10*3/uL (ref 4.0–10.5)

## 2015-05-13 LAB — COMPREHENSIVE METABOLIC PANEL
ALT: 21 U/L (ref 17–63)
ANION GAP: 6 (ref 5–15)
AST: 32 U/L (ref 15–41)
Albumin: 1.7 g/dL — ABNORMAL LOW (ref 3.5–5.0)
Alkaline Phosphatase: 227 U/L — ABNORMAL HIGH (ref 38–126)
CO2: 19 mmol/L — ABNORMAL LOW (ref 22–32)
Calcium: 7.2 mg/dL — ABNORMAL LOW (ref 8.9–10.3)
Chloride: 118 mmol/L — ABNORMAL HIGH (ref 101–111)
Creatinine, Ser: 0.57 mg/dL — ABNORMAL LOW (ref 0.61–1.24)
GFR calc Af Amer: >60 mL/min (ref >60)
Glucose, Bld: 183 mg/dL — ABNORMAL HIGH (ref 65–99)
Potassium: 3 mmol/L — ABNORMAL LOW (ref 3.5–5.1)
Sodium: 143 mmol/L (ref 135–145)
TOTAL PROTEIN: 5.4 g/dL — AB (ref 6.5–8.1)
Total Bilirubin: 0.4 mg/dL (ref 0.3–1.2)

## 2015-05-13 LAB — BASIC METABOLIC PANEL
Anion gap: 7 (ref 5–15)
BUN: <5 mg/dL — ABNORMAL LOW (ref 6–20)
CO2: 16 mmol/L — ABNORMAL LOW (ref 22–32)
CREATININE: 0.55 mg/dL — AB (ref 0.61–1.24)
Calcium: 7.5 mg/dL — ABNORMAL LOW (ref 8.9–10.3)
Chloride: 117 mmol/L — ABNORMAL HIGH (ref 101–111)
Glucose, Bld: 91 mg/dL (ref 65–99)
POTASSIUM: 3.3 mmol/L — AB (ref 3.5–5.1)
Sodium: 140 mmol/L (ref 135–145)

## 2015-05-13 LAB — ETHANOL
ALCOHOL ETHYL (B): 199 mg/dL — AB (ref <5)
Alcohol, Ethyl (B): 311 mg/dL (ref <5)

## 2015-05-13 LAB — MAGNESIUM: Magnesium: 1.7 mg/dL (ref 1.7–2.4)

## 2015-05-13 LAB — AMMONIA: Ammonia: 38 umol/L — ABNORMAL HIGH (ref 9–35)

## 2015-05-13 MED ORDER — SODIUM CHLORIDE 0.9 % IV SOLN
1000.0000 mg | Freq: Once | INTRAVENOUS | Status: AC
  Administered 2015-05-13: 1000 mg via INTRAVENOUS
  Filled 2015-05-13: qty 20

## 2015-05-13 MED ORDER — PHENYTOIN SODIUM 50 MG/ML IJ SOLN
INTRAMUSCULAR | Status: AC
  Filled 2015-05-13: qty 20

## 2015-05-13 MED ORDER — SODIUM CHLORIDE 0.9 % IV SOLN
INTRAVENOUS | Status: DC
  Administered 2015-05-13 – 2015-05-14 (×3): via INTRAVENOUS

## 2015-05-13 MED ORDER — THIAMINE HCL 100 MG/ML IJ SOLN: 100.0000 mg | Freq: Every day | INTRAMUSCULAR | Status: DC

## 2015-05-13 MED ORDER — INSULIN ASPART 100 UNIT/ML ~~LOC~~ SOLN
0.0000 [IU] | Freq: Three times a day (TID) | SUBCUTANEOUS | Status: DC
  Administered 2015-05-13 – 2015-05-14 (×2): 3 [IU] via SUBCUTANEOUS

## 2015-05-13 MED ORDER — POTASSIUM CHLORIDE CRYS ER 20 MEQ PO TBCR
40.0000 meq | EXTENDED_RELEASE_TABLET | Freq: Once | ORAL | Status: AC
  Administered 2015-05-13: 40 meq via ORAL
  Filled 2015-05-13: qty 2

## 2015-05-13 MED ORDER — ADULT MULTIVITAMIN W/MINERALS CH
1.0000 | ORAL_TABLET | Freq: Every day | ORAL | Status: DC
  Administered 2015-05-13 – 2015-05-14 (×2): 1 via ORAL
  Filled 2015-05-13 (×2): qty 1

## 2015-05-13 MED ORDER — LORAZEPAM 1 MG PO TABS
0.0000 mg | ORAL_TABLET | Freq: Four times a day (QID) | ORAL | Status: DC
  Administered 2015-05-14: 1 mg via ORAL
  Filled 2015-05-13: qty 1

## 2015-05-13 MED ORDER — ACETAMINOPHEN 325 MG PO TABS: 650.0000 mg | ORAL_TABLET | Freq: Four times a day (QID) | ORAL | Status: DC | PRN

## 2015-05-13 MED ORDER — HEPARIN SODIUM (PORCINE) 5000 UNIT/ML IJ SOLN
5000.0000 [IU] | Freq: Three times a day (TID) | INTRAMUSCULAR | Status: DC
  Administered 2015-05-13 – 2015-05-14 (×4): 5000 [IU] via SUBCUTANEOUS
  Filled 2015-05-13 (×4): qty 1

## 2015-05-13 MED ORDER — ACETAMINOPHEN 650 MG RE SUPP: 650.0000 mg | Freq: Four times a day (QID) | RECTAL | Status: DC | PRN

## 2015-05-13 MED ORDER — ONDANSETRON HCL 4 MG PO TABS: 4.0000 mg | ORAL_TABLET | Freq: Four times a day (QID) | ORAL | Status: DC | PRN

## 2015-05-13 MED ORDER — LORAZEPAM 2 MG/ML IJ SOLN: 0.0000 mg | Freq: Two times a day (BID) | INTRAMUSCULAR | Status: DC

## 2015-05-13 MED ORDER — SODIUM CHLORIDE 0.9 % IJ SOLN
3.0000 mL | Freq: Two times a day (BID) | INTRAMUSCULAR | Status: DC
  Administered 2015-05-14: 3 mL via INTRAVENOUS

## 2015-05-13 MED ORDER — INSULIN ASPART 100 UNIT/ML ~~LOC~~ SOLN: 0.0000 [IU] | Freq: Every day | SUBCUTANEOUS | Status: DC

## 2015-05-13 MED ORDER — LORAZEPAM 2 MG/ML IJ SOLN
0.0000 mg | Freq: Four times a day (QID) | INTRAMUSCULAR | Status: DC
  Administered 2015-05-13 – 2015-05-14 (×2): 2 mg via INTRAVENOUS
  Filled 2015-05-13 (×2): qty 1

## 2015-05-13 MED ORDER — VITAMIN B-1 100 MG PO TABS
100.0000 mg | ORAL_TABLET | Freq: Every day | ORAL | Status: DC
  Administered 2015-05-13 – 2015-05-14 (×2): 100 mg via ORAL

## 2015-05-13 MED ORDER — DEXTROSE 50 % IV SOLN
INTRAVENOUS | Status: AC
  Administered 2015-05-13: 25 g via INTRAVENOUS
  Filled 2015-05-13: qty 50

## 2015-05-13 MED ORDER — FOLIC ACID 1 MG PO TABS
1.0000 mg | ORAL_TABLET | Freq: Every day | ORAL | Status: DC
  Administered 2015-05-13 – 2015-05-14 (×2): 1 mg via ORAL
  Filled 2015-05-13 (×2): qty 1

## 2015-05-13 MED ORDER — LORAZEPAM 1 MG PO TABS: 0.0000 mg | ORAL_TABLET | Freq: Two times a day (BID) | ORAL | Status: DC

## 2015-05-13 MED ORDER — POVIDONE-IODINE 10 % EX SOLN
CUTANEOUS | Status: AC
  Administered 2015-05-13: 1
  Filled 2015-05-13: qty 118

## 2015-05-13 MED ORDER — NICOTINE 21 MG/24HR TD PT24
MEDICATED_PATCH | TRANSDERMAL | Status: AC
  Filled 2015-05-13: qty 1

## 2015-05-13 MED ORDER — ENSURE ENLIVE PO LIQD
237.0000 mL | Freq: Two times a day (BID) | ORAL | Status: DC
  Administered 2015-05-14 (×2): 237 mL via ORAL

## 2015-05-13 MED ORDER — VITAMIN B-1 100 MG PO TABS
100.0000 mg | ORAL_TABLET | Freq: Every day | ORAL | Status: DC
Start: 2015-05-13 — End: 2015-05-14
  Filled 2015-05-13 (×2): qty 1

## 2015-05-13 MED ORDER — LORAZEPAM 1 MG PO TABS: 1.0000 mg | ORAL_TABLET | Freq: Four times a day (QID) | ORAL | Status: DC | PRN

## 2015-05-13 MED ORDER — PHENYTOIN SODIUM EXTENDED 100 MG PO CAPS
100.0000 mg | ORAL_CAPSULE | Freq: Three times a day (TID) | ORAL | Status: DC
  Administered 2015-05-13 – 2015-05-14 (×4): 100 mg via ORAL
  Filled 2015-05-13 (×4): qty 1

## 2015-05-13 MED ORDER — LORAZEPAM 2 MG/ML IJ SOLN
0.0000 mg | Freq: Four times a day (QID) | INTRAMUSCULAR | Status: DC
Start: 2015-05-13 — End: 2015-05-14
  Administered 2015-05-13: 2 mg via INTRAVENOUS
  Filled 2015-05-13 (×2): qty 1

## 2015-05-13 MED ORDER — NICOTINE 21 MG/24HR TD PT24
21.0000 mg | MEDICATED_PATCH | Freq: Once | TRANSDERMAL | Status: AC
  Administered 2015-05-13: 21 mg via TRANSDERMAL

## 2015-05-13 MED ORDER — DEXTROSE 50 % IV SOLN
25.0000 g | Freq: Once | INTRAVENOUS | Status: AC
  Administered 2015-05-13: 25 g via INTRAVENOUS

## 2015-05-13 MED ORDER — ONDANSETRON HCL 4 MG/2ML IJ SOLN: 4.0000 mg | Freq: Four times a day (QID) | INTRAMUSCULAR | Status: DC | PRN

## 2015-05-13 MED ORDER — LORAZEPAM 2 MG/ML IJ SOLN: 1.0000 mg | Freq: Four times a day (QID) | INTRAMUSCULAR | Status: DC | PRN

## 2015-05-13 NOTE — H&P (Signed)
[facility] History and Physical  Talbert NanMichael A Loving GMW:[id number] DOB: 12/22/1966 DOA: 05/12/2015  Referring physician: Emergency Department PCP: No PCP Per Patient  Specialists:   Chief Complaint: Fall, seizures  HPI: Talbert NanMichael A Srinivasan is a 49 y.o. male  With a hx of ETOH abuse with numerous prior admissions. Pt presents with witnessed seizure activity involving tongue biting. In the ED, pt was found to have an elevated alcohol level of 199. Pt was found to have a subtherapeutic phenytoin level. Initially claimed to be compliant with his phenytoin but when confronted with his subtherapeutic level, pt then claimed to missing only two doses yesterday. Pt was started on phenytoin, CIWA ordered, and hospitalist consulted. Of note, CT head was unremarkable.  Review of Systems:  Review of Systems  Constitutional: Negative for chills and weight loss.  HENT: Negative for congestion and ear discharge.   Eyes: Negative for pain and discharge.  Respiratory: Negative for cough and sputum production.   Cardiovascular: Negative for claudication and leg swelling.  Gastrointestinal: Negative for nausea, vomiting and abdominal pain.  Genitourinary: Negative for dysuria and frequency.  Musculoskeletal: Positive for back pain and falls. Negative for joint pain.  Skin: Negative for itching and rash.  Neurological: Positive for tremors, seizures and weakness. Negative for dizziness.  Psychiatric/Behavioral: Negative for memory loss. The patient is not nervous/anxious.     Past Medical History  Diagnosis Date  . Pancreatitis, acute     First episode earlier in 2012, hospitalized again in 02/2012 and 09/2012  . Hypertension     noncompliance  . Hyperlipidemia     noncompliance  . Lung collapse     h/o  . ETOH abuse   . Cocaine abuse   . DTs (delirium tremens)     history of  . Tobacco abuse   . Seizure disorder 07/17/2013  . Noncompliance 07/17/2013  . Traumatic subdural hematoma 06/15/2013   . 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 Open skull fracture 06/15/2013  . Polysubstance abuse     etoh, cocaine  . Seizures   . Drug-seeking behavior   . Diabetes mellitus without complication   . Nephrolithiasis    Past Surgical History  Procedure Laterality Date  . Mandible fracture surgery  2006  . Left axillary      surgery for deep laceration   Social History:  reports that he has been smoking Cigarettes.  He has a 20 pack-year smoking history. He uses smokeless tobacco. He reports that he drinks alcohol. He reports that he uses illicit drugs (Cocaine) about 3 times per week.  where does patient live--home, ALF, SNF? and with whom if at home?  Can patient participate in ADLs?  No Known Allergies  Family History  Problem Relation Age of Onset  . Diabetes type II Mother   . Liver disease Neg Hx   . Colon cancer Neg Hx   . GI problems Neg Hx     (be sure to complete)  Prior to Admission medications   Medication Sig Start Date End Date Taking? Authorizing Provider  phenytoin (DILANTIN) 100 MG ER capsule Take 1 capsule (100 mg total) by mouth 3 (three) times daily. Patient taking differently: Take 300 mg by mouth at bedtime.  02/21/15  Yes Lesle ChrisKaren M Black, NP  potassium chloride (K-DUR) 10 MEQ tablet Take 1 tablet (10 mEq total) by mouth 2 (two) times daily. 04/27/15  Yes Vanetta MuldersScott Zackowski, MD  feeding supplement, GLUCERNA SHAKE, (GLUCERNA SHAKE) LIQD Take 237 mLs by mouth 3 (  three) times daily between meals. Patient not taking: Reported on 04/13/2015 02/20/15   Gwenyth Bender, NP  folic acid (FOLVITE) 1 MG tablet Take 1 tablet (1 mg total) by mouth daily. Patient not taking: Reported on 04/13/2015 02/20/15   Gwenyth Bender, NP  glipiZIDE (GLUCOTROL) 5 MG tablet Take 0.5 tablets (2.5 mg total) by mouth daily before breakfast. Patient not taking: Reported on 04/13/2015 02/21/15   Gwenyth Bender, NP  phenytoin (DILANTIN) 100 MG ER capsule Take 1 capsule (100 mg total) by mouth 3  (three) times daily. Patient not taking: Reported on 05/13/2015 04/27/15   Vanetta Mulders, MD  thiamine 100 MG tablet Take 1 tablet (100 mg total) by mouth daily. Patient not taking: Reported on 04/13/2015 02/20/15   Gwenyth Bender, NP   Physical Exam: Filed Vitals:   05/13/15 0556 05/13/15 0700 05/13/15 0730 05/13/15 0800  BP: 115/91 124/92 113/94 114/91  Pulse: 78 81 85 83  Resp: 14 16    Height:      Weight:      SpO2: 96% 98% 100% 100%     General:  Awake, in nad  Eyes: PERRL B  ENT: membranes moist, dentition fair  Neck: neck supple, trachea midline  Cardiovascular: regular, s1, s2  Respiratory: normal resp effort, no wheezing  Abdomen: soft,nondistended  Skin: normal skin turgor, no abnormal skin lesions seen  Musculoskeletal: perfused, no clubbing  Psychiatric: mood/affect normal//no auditory/visual hallucinations  Neurologic: cn2-12 grossly intact, strength/sensation intact  Labs on Admission:  Basic Metabolic Panel:  Recent Labs Lab 05/12/15 2308 05/13/15 1028  NA 140 143  K 3.3* 3.0*  CL 117* 118*  CO2 16* 19*  GLUCOSE 91 183*  BUN <5* <5*  CREATININE 0.55* 0.57*  CALCIUM 7.5* 7.2*  MG  --  1.7   Liver Function Tests:  Recent Labs Lab 05/13/15 1028  AST 32  ALT 21  ALKPHOS 227*  BILITOT 0.4  PROT 5.4*  ALBUMIN 1.7*   No results for input(s): LIPASE, AMYLASE in the last 168 hours.  Recent Labs Lab 05/13/15 1028  AMMONIA 38*   CBC:  Recent Labs Lab 05/12/15 2308 05/13/15 1028  WBC 6.2 4.6  NEUTROABS 2.5 2.3  HGB 11.4* 11.2*  HCT 34.2* 33.7*  MCV 90.7 91.1  PLT 233 232   Cardiac Enzymes: No results for input(s): CKTOTAL, CKMB, CKMBINDEX, TROPONINI in the last 168 hours.  BNP (last 3 results) No results for input(s): BNP in the last 8760 hours.  ProBNP (last 3 results) No results for input(s): PROBNP in the last 8760 hours.  CBG: No results for input(s): GLUCAP in the last 168 hours.  Radiological Exams on  Admission: Ct Head Wo Contrast  05/13/2015   CLINICAL DATA:  Found down on ground outside store, possible seizure. Posterior head laceration. History of hypertension, seizure disorder, diabetes, skull fracture and subdural hematoma.  EXAM: CT HEAD WITHOUT CONTRAST  CT CERVICAL SPINE WITHOUT CONTRAST  TECHNIQUE: Multidetector CT imaging of the head and cervical spine was performed following the standard protocol without intravenous contrast. Multiplanar CT image reconstructions of the cervical spine were also generated.  COMPARISON:  CT of the head February 17, 2005  FINDINGS: CT HEAD FINDINGS  No intraparenchymal hemorrhage, mass effect, midline shift or acute large vascular territory infarct. Bifrontal, LEFT parietal encephalomalacia present on prior imaging. Mild to moderate ventriculomegaly, advanced for age with disproportionate prominence of the cerebellar folia.  No abnormal extra-axial fluid collections. Basal cisterns are patent.  Small  RIGHT parietal scalp hematoma. No acute skull fracture. Remote bilateral medial orbital blowout fractures. Remote depressed LEFT zygomatic arch fracture. RIGHT mandible plate and screw fixation.  CT CERVICAL SPINE FINDINGS  Broad reversed cervical lordosis appear vertebral bodies and posterior elements are appear intact. Moderate to severe C5-6, C6-7 disc height loss, vacuum disc, endplate sclerosis and marginal spurring consistent with degenerative disc, moderate C3-4 and C4-5. C1-2 articulation maintained with mild arthropathy. C1-2 rotary scoliosis mild broad levoscoliosis. No destructive bony lesions. Prevertebral and paraspinal soft tissues are nonsuspicious.  Degenerative discs and facet arthropathy result in moderate to severe osseous canal stenosis C5-6, moderate at C6-7. Severe LEFT C3-4, bilateral C5-6 and C6-7 neural foraminal narrowing.  IMPRESSION: CT HEAD: No acute intracranial process.  Multifocal encephalomalacia is likely posttraumatic.  Mild to moderate  parenchymal brain volume loss for age, disproportionate cerebellar atrophy.  CT CERVICAL SPINE: Broad reversed cervical lordosis without acute fracture nor malalignment.  Moderate to severe osseous canal stenosis C5-6, moderate at C6-7. Severe LEFT C3-4, bilateral C5-6 and C6-7 neural foraminal narrowing.   Electronically Signed   By: Awilda Metroourtnay  Bloomer   On: 05/13/2015 01:49   Ct Cervical Spine Wo Contrast  05/13/2015   CLINICAL DATA:  Found down on ground outside store, possible seizure. Posterior head laceration. History of hypertension, seizure disorder, diabetes, skull fracture and subdural hematoma.  EXAM: CT HEAD WITHOUT CONTRAST  CT CERVICAL SPINE WITHOUT CONTRAST  TECHNIQUE: Multidetector CT imaging of the head and cervical spine was performed following the standard protocol without intravenous contrast. Multiplanar CT image reconstructions of the cervical spine were also generated.  COMPARISON:  CT of the head February 17, 2005  FINDINGS: CT HEAD FINDINGS  No intraparenchymal hemorrhage, mass effect, midline shift or acute large vascular territory infarct. Bifrontal, LEFT parietal encephalomalacia present on prior imaging. Mild to moderate ventriculomegaly, advanced for age with disproportionate prominence of the cerebellar folia.  No abnormal extra-axial fluid collections. Basal cisterns are patent.  Small RIGHT parietal scalp hematoma. No acute skull fracture. Remote bilateral medial orbital blowout fractures. Remote depressed LEFT zygomatic arch fracture. RIGHT mandible plate and screw fixation.  CT CERVICAL SPINE FINDINGS  Broad reversed cervical lordosis appear vertebral bodies and posterior elements are appear intact. Moderate to severe C5-6, C6-7 disc height loss, vacuum disc, endplate sclerosis and marginal spurring consistent with degenerative disc, moderate C3-4 and C4-5. C1-2 articulation maintained with mild arthropathy. C1-2 rotary scoliosis mild broad levoscoliosis. No destructive bony  lesions. Prevertebral and paraspinal soft tissues are nonsuspicious.  Degenerative discs and facet arthropathy result in moderate to severe osseous canal stenosis C5-6, moderate at C6-7. Severe LEFT C3-4, bilateral C5-6 and C6-7 neural foraminal narrowing.  IMPRESSION: CT HEAD: No acute intracranial process.  Multifocal encephalomalacia is likely posttraumatic.  Mild to moderate parenchymal brain volume loss for age, disproportionate cerebellar atrophy.  CT CERVICAL SPINE: Broad reversed cervical lordosis without acute fracture nor malalignment.  Moderate to severe osseous canal stenosis C5-6, moderate at C6-7. Severe LEFT C3-4, bilateral C5-6 and C6-7 neural foraminal narrowing.   Electronically Signed   By: Awilda Metroourtnay  Bloomer   On: 05/13/2015 01:49    Assessment/Plan Principal Problem:   Convulsions/seizures Active Problems:   Alcohol abuse   Tobacco abuse   Hypertension   Noncompliance   Non compliance w medication regimen   Uncontrolled type 2 diabetes mellitus with hyperosmolar nonketotic hyperglycemia   Ataxia  1. Seizures 1. Subtherapeutic phenytoin level, likely secondary to noncompliance 2. Cont on phenytoin per home regimen 3. Pt  with elevated ETOH levels 4. Seizures therefore likely combination of above 2. ETOH abuse 1. Will cont pt on CIWA 2. Cessation 3. Tobacco abuse 1. Cessation done at bedside 4. HTN 1. BP stable and controlled 2. Monitor 5. Medical noncompliance 1. Reminded to be compliant in future 6. Weakness 1. Will consult PT/OT 7. DM2 1. Cont on SSI coverage 8. DVT prophylaxis 1. Heparin subQ  Code Status: Full  Family Communication: Pt in room  Disposition Plan: Admit med-tele  Andren Bethea, Scheryl Marten [facility] Pager [phone]  If 7PM-7AM, please contact night-coverage [email or web address] Password [email or web address] 05/13/2015, 1:41 PM

## 2015-05-13 NOTE — ED Provider Notes (Signed)
THIS IS A SHARED VISIT WITH DR. HORTON.  Paul [name] is a 49 y.o. male with a laceration to the posterior aspect of the scalp.   BP 124/104 mmHg  Pulse 82  Resp 22  Ht 6' (1.829 m)  Wt 157 lb (71.215 kg)  BMI 21.29 kg/m2  SpO2 100%   LACERATION REPAIR Performed by: Kyley Solow Authorized by: Arvie Bartholomew Consent: Verbal consent obtained. Risks and benefits: risks, benefits and alternatives were discussed Consent given by: patient Patient identity confirmed: provided demographic data Prepped and Draped in normal sterile fashion Wound explored  Laceration Location: posterior scalp  Laceration Length: 2.5 cm  No Foreign Bodies seen or palpated  Anesthesia: none  Cleaned with Betadine and NSS  Amount of cleaning: standard  Skin closure: staples  Number of staples: 4  Patient tolerance: Patient tolerated the procedure well with no immediate complications.   [name]Paul [name], [place]NP 05/13/15 [phone]  Shon Batonourtney F Horton, MD 05/13/15 [phone]

## 2015-05-13 NOTE — ED Notes (Signed)
Pt alert and oriented, padded side rails.

## 2015-05-13 NOTE — ED Notes (Signed)
Pt reminded about staples in head, pt claims he does not remember getting them.  Lisa on floor notified.

## 2015-05-13 NOTE — ED Notes (Signed)
Pt given meal tray.

## 2015-05-13 NOTE — ED Provider Notes (Signed)
Pt received at sign out. Pt arrived last night intoxicated, s/p fall and seizure. Pt well known to the ED for these complaints, with multiple ED visits. Known alcoholic with medication non-compliance. IV dilantin has been given. Scalp lac closed. Pt has slept most of his ED stay. After 10 hours in the ED, pt unable to ambulate without heavy 2 person assist, gait ataxic. Pt has usually he has become belligerent and left AMA by this time; however, pt does not want to leave today. Labs redrawn. Etoh trending downward. Potassium low; repleted PO; magnesium level ordered. Ammonia lower than previous. VS remain stable. Pt was admitted previously for medication non-compliance and noted to have ataxic gait; PT staff was concerned regarding pt's safety at home alone at that time, but pt apparently signed out of the hospital AMA. Pt asked again to ambulate and call for a ride; but he continues to be extremely unsteady on his feet. Concerned about home safety; will admit. 1205:  T/C to [facility]. Rhona Leavenshiu, case discussed, including:  HPI, pertinent PM/SHx, VS/PE, dx testing, ED course and treatment:  Agreeable to admit, requests to write temporary orders, obtain tele bed to team APAdmits.   Samuel JesterKathleen Darrly Loberg, DO 05/13/15 1248

## 2015-05-13 NOTE — ED Provider Notes (Signed)
Patient signed out pending imaging.  Imaging unremarkable.  Patient with EtOH of 311.  Will ambulate and PO challenge when clinically sober.  Laceration repaired by PA.  4:45am Patient continues to ramble and is clinically drunk.  Peeing the the bed.  I have redirected the patient.  Not able to ambulate at this time.  Patient requesting being able to go outside and smoke.  GIven nicotine patch.  7:30am Patient sleeping.  Have requested nursing to wake up, ambulate and PO challenge.  Shon Batonourtney F Anjannette Gauger, MD 05/13/15 [phone]

## 2015-05-13 NOTE — ED Notes (Signed)
Pt ambulated to bathroom with unsteady gait, 2 person assist.

## 2015-05-14 DIAGNOSIS — Z5181 Encounter for therapeutic drug level monitoring: Secondary | ICD-10-CM

## 2015-05-14 DIAGNOSIS — F10120 Alcohol abuse with intoxication, uncomplicated: Secondary | ICD-10-CM

## 2015-05-14 DIAGNOSIS — Y906 Blood alcohol level of 120-199 mg/100 ml: Secondary | ICD-10-CM | POA: Diagnosis present

## 2015-05-14 DIAGNOSIS — F17210 Nicotine dependence, cigarettes, uncomplicated: Secondary | ICD-10-CM | POA: Diagnosis present

## 2015-05-14 DIAGNOSIS — R270 Ataxia, unspecified: Secondary | ICD-10-CM

## 2015-05-14 DIAGNOSIS — E785 Hyperlipidemia, unspecified: Secondary | ICD-10-CM | POA: Diagnosis present

## 2015-05-14 DIAGNOSIS — Z87442 Personal history of urinary calculi: Secondary | ICD-10-CM | POA: Diagnosis not present

## 2015-05-14 DIAGNOSIS — I10 Essential (primary) hypertension: Secondary | ICD-10-CM | POA: Diagnosis present

## 2015-05-14 DIAGNOSIS — F10129 Alcohol abuse with intoxication, unspecified: Secondary | ICD-10-CM | POA: Diagnosis present

## 2015-05-14 DIAGNOSIS — S0101XA Laceration without foreign body of scalp, initial encounter: Secondary | ICD-10-CM | POA: Diagnosis present

## 2015-05-14 DIAGNOSIS — Z9114 Patient's other noncompliance with medication regimen: Secondary | ICD-10-CM | POA: Diagnosis present

## 2015-05-14 DIAGNOSIS — W19XXXA Unspecified fall, initial encounter: Secondary | ICD-10-CM | POA: Diagnosis present

## 2015-05-14 DIAGNOSIS — Y92512 Supermarket, store or market as the place of occurrence of the external cause: Secondary | ICD-10-CM | POA: Diagnosis not present

## 2015-05-14 DIAGNOSIS — E1100 Type 2 diabetes mellitus with hyperosmolarity without nonketotic hyperglycemic-hyperosmolar coma (NKHHC): Secondary | ICD-10-CM | POA: Diagnosis present

## 2015-05-14 DIAGNOSIS — R569 Unspecified convulsions: Secondary | ICD-10-CM | POA: Diagnosis present

## 2015-05-14 DIAGNOSIS — Z833 Family history of diabetes mellitus: Secondary | ICD-10-CM | POA: Diagnosis not present

## 2015-05-14 LAB — CBC
HCT: 29.6 % — ABNORMAL LOW (ref 39.0–52.0)
HEMOGLOBIN: 10 g/dL — AB (ref 13.0–17.0)
MCH: 31 pg (ref 26.0–34.0)
MCHC: 33.8 g/dL (ref 30.0–36.0)
MCV: 91.6 fL (ref 78.0–100.0)
Platelets: 198 10*3/uL (ref 150–400)
RBC: 3.23 MIL/uL — ABNORMAL LOW (ref 4.22–5.81)
RDW: 16.2 % — ABNORMAL HIGH (ref 11.5–15.5)
WBC: 4.8 10*3/uL (ref 4.0–10.5)

## 2015-05-14 LAB — PHENYTOIN LEVEL, TOTAL: Phenytoin Lvl: 10.6 ug/mL (ref 10.0–20.0)

## 2015-05-14 LAB — COMPREHENSIVE METABOLIC PANEL
ALT: 22 U/L (ref 17–63)
AST: 45 U/L — ABNORMAL HIGH (ref 15–41)
Albumin: 1.6 g/dL — ABNORMAL LOW (ref 3.5–5.0)
Alkaline Phosphatase: 213 U/L — ABNORMAL HIGH (ref 38–126)
Anion gap: 4 — ABNORMAL LOW (ref 5–15)
CO2: 18 mmol/L — ABNORMAL LOW (ref 22–32)
Calcium: 7.3 mg/dL — ABNORMAL LOW (ref 8.9–10.3)
Chloride: 123 mmol/L — ABNORMAL HIGH (ref 101–111)
Creatinine, Ser: 0.58 mg/dL — ABNORMAL LOW (ref 0.61–1.24)
GFR calc Af Amer: >60 mL/min (ref >60)
Glucose, Bld: 90 mg/dL (ref 65–99)
Potassium: 3.8 mmol/L (ref 3.5–5.1)
SODIUM: 145 mmol/L (ref 135–145)
Total Bilirubin: 0.5 mg/dL (ref 0.3–1.2)
Total Protein: 5 g/dL — ABNORMAL LOW (ref 6.5–8.1)

## 2015-05-14 LAB — GLUCOSE, CAPILLARY
Glucose-Capillary: 78 mg/dL (ref 65–99)
Glucose-Capillary: 184 mg/dL — ABNORMAL HIGH (ref 65–99)
Glucose-Capillary: 124 mg/dL — ABNORMAL HIGH (ref 65–99)

## 2015-05-14 MED ORDER — CLONIDINE HCL 0.2 MG PO TABS
0.2000 mg | ORAL_TABLET | ORAL | Status: AC
  Administered 2015-05-14: 0.2 mg via ORAL
  Filled 2015-05-14: qty 1

## 2015-05-14 MED ORDER — HYDRALAZINE HCL 20 MG/ML IJ SOLN
5.0000 mg | INTRAMUSCULAR | Status: DC | PRN
  Administered 2015-05-14: 5 mg via INTRAVENOUS
  Filled 2015-05-14: qty 1

## 2015-05-14 MED ORDER — AMLODIPINE BESYLATE 10 MG PO TABS: 10.0000 mg | ORAL_TABLET | Freq: Every day | ORAL | Status: DC

## 2015-05-14 MED ORDER — HYDRALAZINE HCL 20 MG/ML IJ SOLN: 10.0000 mg | INTRAMUSCULAR | Status: DC | PRN

## 2015-05-14 MED ORDER — AMLODIPINE BESYLATE 5 MG PO TABS
10.0000 mg | ORAL_TABLET | Freq: Every day | ORAL | Status: DC
  Administered 2015-05-14: 10 mg via ORAL
  Filled 2015-05-14: qty 2

## 2015-05-14 MED ORDER — PHENYTOIN SODIUM EXTENDED 100 MG PO CAPS: 100.0000 mg | ORAL_CAPSULE | Freq: Three times a day (TID) | ORAL | Status: DC

## 2015-05-14 NOTE — Progress Notes (Signed)
Hypoglycemic Event  CBG: 62 at 2112  Treatment: 15 GM carbohydrate snack  Symptoms: None  Follow-up CBG: Time 2145 CBG Result:54  Possible Reasons for Event: Unknown  Comments/MD notified: See next hypoglycemia note for follow up   Gladis RiffleBullock, Gabrielly Mccrystal Lewis  Remember to initiate Hypoglycemia Order Set & complete

## 2015-05-14 NOTE — Evaluation (Signed)
Physical Therapy Evaluation Patient Details Name: Paul [name] MRN: [id number] DOB: 1966/10/26 Today's Date: 05/14/2015   History of Present Illness  With a hx of ETOH abuse with numerous prior admissions. Pt presents with witnessed seizure activity involving tongue biting. In the ED, pt was found to have an elevated alcohol level of 199. Pt was found to have a subtherapeutic phenytoin level. Initially claimed to be compliant with his phenytoin but when confronted with his subtherapeutic level, pt then claimed to missing only two doses yesterday. Pt was started on phenytoin, CIWA ordered, and hospitalist consulted. Of note, CT head was unremarkable.  Clinical Impression   Pt was seen for evaluation.  He was lethargic and kept his eyes closed during muscle testing.  His LE strength is adequate for function but poor for his age.  He does have a peripheral neuropathy bilaterally with noted weakness in DF/PF.  His coordination is WNL.  He is independent in transfers.  He was able to ambulate 2' with no assistive device but tends to fall to the right at times.  He was instructed in gait with a cane and this evened out his gait rhythm however he was quite short tempered with using the cane and abruptly ended the PT session.  He should be using a cane for gait but I am not sure that he has the temperament to use one.  We could try OP PT but this would depend on his degree of acknowledging that he has a problem.    Follow Up Recommendations Outpatient PT (per pt willingness to participate)    Equipment Recommendations  Cane    Recommendations for Other Services   none    Precautions / Restrictions Precautions Precautions: Fall Restrictions Weight Bearing Restrictions: No      Mobility  Bed Mobility Overal bed mobility: Independent                Transfers Overall transfer level: Independent                  Ambulation/Gait Ambulation/Gait assistance:  Supervision Ambulation Distance (Feet): 80 Feet (x2) Assistive device: None;Straight cane Gait Pattern/deviations: Decreased dorsiflexion - right;Decreased dorsiflexion - left;Wide base of support;Staggering right   Gait velocity interpretation: at or above normal speed for age/gender General Gait Details: pt ambulated once with no assistive device and tended to stagger right...he was given a cane to use and instructed in its' use...his gait pattern was fairly consistent with it and did mimimize any ataxia  Stairs            Wheelchair Mobility    Modified Rankin (Stroke Patients Only)       Balance Overall balance assessment: Needs assistance Sitting-balance support: No upper extremity supported;Feet supported Sitting balance-Leahy Scale: Normal     Standing balance support: No upper extremity supported Standing balance-Leahy Scale: Fair                               Pertinent Vitals/Pain Pain Assessment: No/denies pain    Home Living Family/patient expects to be discharged to:: Private residence Living Arrangements: Non-relatives/Friends Available Help at Discharge: Friend(s);Available PRN/intermittently Type of Home: House Home Access: Level entry     Home Layout: One level Home Equipment: Wheelchair - manual Additional Comments: w/c was provided to pt during the last admission    Prior Function Level of Independence: Independent  Hand Dominance   Dominant Hand: Right    Extremity/Trunk Assessment               Lower Extremity Assessment: Overall WFL for tasks assessed;RLE deficits/detail;LLE deficits/detail RLE Deficits / Details: weakness in anterior tibialis and gastrocnemius to 3-/5 LLE Deficits / Details: weakness in anterior tibialis and gastrocnemius to 3-/5  Cervical / Trunk Assessment: Normal  Communication   Communication: No difficulties  Cognition Arousal/Alertness: Lethargic Behavior During Therapy:  Flat affect (uncooperative) Overall Cognitive Status: Within Functional Limits for tasks assessed                      General Comments      Exercises        Assessment/Plan    PT Assessment All further PT needs can be met in the next venue of care  PT Diagnosis Abnormality of gait   PT Problem List Decreased mobility;Decreased balance  PT Treatment Interventions     PT Goals (Current goals can be found in the Care Plan section) Acute Rehab PT Goals PT Goal Formulation: All assessment and education complete, DC therapy    Frequency     Barriers to discharge  none      Co-evaluation               End of Session Equipment Utilized During Treatment: Gait belt Activity Tolerance: Other (comment) (PT limited by pt's ability to tolerate PT visit) Patient left: in bed;with call bell/phone within reach;with nursing/sitter in room Nurse Communication: Mobility status         Time: 0076-2263 PT Time Calculation (min) (ACUTE ONLY): 24 min   Charges:   PT Evaluation $Initial PT Evaluation Tier I: 1 Procedure     PT G CodesDemetrios Isaacs L 05/14/2015, 12:22 PM

## 2015-05-14 NOTE — Progress Notes (Signed)
Hypoglycemic Event  CBG: 54 at 2145  Treatment: D50 IV 50 mL  Symptoms: None  Follow-up CBG: Time: 2220 CBG Result: 144  Possible Reasons for Event: Unknown  Comments/MD notified:CBG effectively treated no need to notify MD.  Nursing staff to continue to monitor    Gladis RiffleBullock, Diamante Truszkowski Lewis  Remember to initiate Hypoglycemia Order Set & complete

## 2015-05-14 NOTE — Progress Notes (Signed)
Patient with orders to be discharge. Discharge instructions given. Patient educated on importance of following up with MD in one week, importance of taking prescription medications as ordered. Patient educated to not drive, operate machinery, or hold babies or anything fragile for the next 6 months due to seizures. Prescriptions given. Patient stable. Patient left with brother in private vehicle.

## 2015-05-14 NOTE — Discharge Instructions (Signed)
Alcohol Use Disorder Alcohol use disorder is a mental disorder. It is not a one-time incident of heavy drinking. Alcohol use disorder is the excessive and uncontrollable use of alcohol over time that leads to problems with functioning in one or more areas of daily living. People with this disorder risk harming themselves and others when they drink to excess. Alcohol use disorder also can cause other mental disorders, such as mood and anxiety disorders, and serious physical problems. People with alcohol use disorder often misuse other drugs.  Alcohol use disorder is common and widespread. Some people with this disorder drink alcohol to cope with or escape from negative life events. Others drink to relieve chronic pain or symptoms of mental illness. People with a family history of alcohol use disorder are at higher risk of losing control and using alcohol to excess.  SYMPTOMS  Signs and symptoms of alcohol use disorder may include the following:   Consumption ofalcohol inlarger amounts or over a longer period of time than intended.  Multiple unsuccessful attempts to cutdown or control alcohol use.   A great deal of time spent obtaining alcohol, using alcohol, or recovering from the effects of alcohol (hangover).  A strong desire or urge to use alcohol (cravings).   Continued use of alcohol despite problems at work, school, or home because of alcohol use.   Continued use of alcohol despite problems in relationships because of alcohol use.  Continued use of alcohol in situations when it is physically hazardous, such as driving a car.  Continued use of alcohol despite awareness of a physical or psychological problem that is likely related to alcohol use. Physical problems related to alcohol use can involve the brain, heart, liver, stomach, and intestines. Psychological problems related to alcohol use include intoxication, depression, anxiety, psychosis, delirium, and dementia.   The need for  increased amounts of alcohol to achieve the same desired effect, or a decreased effect from the consumption of the same amount of alcohol (tolerance).  Withdrawal symptoms upon reducing or stopping alcohol use, or alcohol use to reduce or avoid withdrawal symptoms. Withdrawal symptoms include:  Racing heart.  Hand tremor.  Difficulty sleeping.  Nausea.  Vomiting.  Hallucinations.  Restlessness.  Seizures. DIAGNOSIS Alcohol use disorder is diagnosed through an assessment by your health care provider. Your health care provider may start by asking three or four questions to screen for excessive or problematic alcohol use. To confirm a diagnosis of alcohol use disorder, at least two symptoms must be present within a 12-month period. The severity of alcohol use disorder depends on the number of symptoms:  Mild--two or three.  Moderate--four or five.  Severe--six or more. Your health care provider may perform a physical exam or use results from lab tests to see if you have physical problems resulting from alcohol use. Your health care provider may refer you to a mental health professional for evaluation. TREATMENT  Some people with alcohol use disorder are able to reduce their alcohol use to low-risk levels. Some people with alcohol use disorder need to quit drinking alcohol. When necessary, mental health professionals with specialized training in substance use treatment can help. Your health care provider can help you decide how severe your alcohol use disorder is and what type of treatment you need. The following forms of treatment are available:   Detoxification. Detoxification involves the use of prescription medicines to prevent alcohol withdrawal symptoms in the first week after quitting. This is important for people with a history of symptoms   of withdrawal and for heavy drinkers who are likely to have withdrawal symptoms. Alcohol withdrawal can be dangerous and, in severe cases, cause  death. Detoxification is usually provided in a hospital or in-patient substance use treatment facility.  Counseling or talk therapy. Talk therapy is provided by substance use treatment counselors. It addresses the reasons people use alcohol and ways to keep them from drinking again. The goals of talk therapy are to help people with alcohol use disorder find healthy activities and ways to cope with life stress, to identify and avoid triggers for alcohol use, and to handle cravings, which can cause relapse.  Medicines.Different medicines can help treat alcohol use disorder through the following actions:  Decrease alcohol cravings.  Decrease the positive reward response felt from alcohol use.  Produce an uncomfortable physical reaction when alcohol is used (aversion therapy).  Support groups. Support groups are run by people who have quit drinking. They provide emotional support, advice, and guidance. These forms of treatment are often combined. Some people with alcohol use disorder benefit from intensive combination treatment provided by specialized substance use treatment centers. Both inpatient and outpatient treatment programs are available. Document Released: 01/23/2005 Document Revised: 05/02/2014 Document Reviewed: 03/25/2013 Exit[employer] Patient Information 2015 [employer]. This information is not intended to replace advice given to you by your health care provider. Make sure you discuss any questions you have with your health care provider.  

## 2015-05-14 NOTE — Progress Notes (Signed)
Patient with elevated blood pressure. Dr. Rhona Leavenshiu notified.

## 2015-05-14 NOTE — Discharge Summary (Addendum)
Physician Discharge Summary  Paul [name]:[id number] DOB: 07/17/1966 DOA: 05/12/2015  PCP: No PCP Per Patient  Admit date: 05/12/2015 Discharge date: 05/14/2015  Time spent: 20 minutes  Recommendations for Outpatient Follow-up:  1. Pt to follow up with PCP in 1-2 weeks, previously arranged to follow up at [facility] at last recent admission  Discharge Diagnoses:  Principal Problem:   Convulsions/seizures Active Problems:   Alcohol abuse   Tobacco abuse   Hypertension   Noncompliance   Non compliance w medication regimen   Uncontrolled type 2 diabetes mellitus with hyperosmolar nonketotic hyperglycemia   Ataxia   Alcohol intoxication   Discharge Condition: Improved, stable  Diet recommendation: Diabetic, heart healthy  Filed Weights   05/12/15 2213  Weight: 71.215 kg (157 lb)    History of present illness:  Please see admit h and p from 5/14 for details. Briefly, pt presented from the ED with falls and clinical intoxication from ETOH. Pt was admitted.  Hospital Course:   1. Seizures 1. Subtherapeutic phenytoin level, most likely secondary to noncompliance 2. Patient was loaded with IV dilantin in the ED and cont on phenytoin per home regimen when admitted 3. Dilantin levels reached therapeutic levels by the day of discharge 4. Pt with elevated ETOH levels on admit 5. Seizures therefore likely combination of above 6. Remained seizure free during this admission 7. Patient has been instructed not to drive, operate heavy machinery, handle children for at least 6 months until cleared by PCP 2. ETOH abuse 1. Was cont on CIWA 2. Cessation done 3. Tobacco abuse 1. Cessation done at bedside 4. HTN 1. BP was elevated 2. Previous records indicate pt was previously prescribed  amlodipine. Have resumed on discharge 3. Monitor as outpatient 5. Medical noncompliance 1. Reminded to be compliant in future 6. Weakness 1. Consult PT/OT with therapy recs for  outpatient PT. Patient was handed a hand-written prescription for outpatient PT for strengthening 7. DM2 1. Cont on SSI coverage 2. Pt noted to be hypoglycemic, corrected with PO intake 8. DVT prophylaxis 1. Heparin subQ while inpatient  Discharge Exam: Filed Vitals:   05/14/15 0517 05/14/15 0911 05/14/15 0947 05/14/15 1432  BP: 154/108 173/131 157/115 155/107  Pulse: 98  94 97  Temp: 98.5 F (36.9 C)   98.2 F (36.8 C)  TempSrc: Oral   Oral  Resp: 18   18  Height:      Weight:      SpO2: 100%  100% 100%    General: awake, in nad Cardiovascular: regular, s1, s2 Respiratory: normal resp effort no wheezing  Discharge Instructions     Medication List    STOP taking these medications        feeding supplement (GLUCERNA SHAKE) Liqd     folic acid 1 MG tablet  Commonly known as:  FOLVITE     glipiZIDE 5 MG tablet  Commonly known as:  GLUCOTROL     thiamine 100 MG tablet      TAKE these medications        amLODipine 10 MG tablet  Commonly known as:  NORVASC  Take 1 tablet (10 mg total) by mouth daily.     phenytoin 100 MG ER capsule  Commonly known as:  DILANTIN  Take 1 capsule (100 mg total) by mouth 3 (three) times daily.     phenytoin 100 MG ER capsule  Commonly known as:  DILANTIN  Take 1 capsule (100 mg total) by mouth 3 (three) times  daily.     potassium chloride 10 MEQ tablet  Commonly known as:  K-DUR  Take 1 tablet (10 mEq total) by mouth 2 (two) times daily.       No Known Allergies Follow-up Information    Follow up with Follow up with your PCP in 1-2 weeks.      [facility] Schedule an appointment as soon as possible for a visit in 1 week.   Why:  Hospital follow up   Contact information:   [address] [place] [address] [phone]        The results of significant diagnostics from this hospitalization (including imaging, microbiology, ancillary and laboratory) are listed below for reference.     Significant Diagnostic Studies: Ct Head Wo Contrast  05/13/2015   CLINICAL DATA:  Found down on ground outside store, possible seizure. Posterior head laceration. History of hypertension, seizure disorder, diabetes, skull fracture and subdural hematoma.  EXAM: CT HEAD WITHOUT CONTRAST  CT CERVICAL SPINE WITHOUT CONTRAST  TECHNIQUE: Multidetector CT imaging of the head and cervical spine was performed following the standard protocol without intravenous contrast. Multiplanar CT image reconstructions of the cervical spine were also generated.  COMPARISON:  CT of the head February 17, 2005  FINDINGS: CT HEAD FINDINGS  No intraparenchymal hemorrhage, mass effect, midline shift or acute large vascular territory infarct. Bifrontal, LEFT parietal encephalomalacia present on prior imaging. Mild to moderate ventriculomegaly, advanced for age with disproportionate prominence of the cerebellar folia.  No abnormal extra-axial fluid collections. Basal cisterns are patent.  Small RIGHT parietal scalp hematoma. No acute skull fracture. Remote bilateral medial orbital blowout fractures. Remote depressed LEFT zygomatic arch fracture. RIGHT mandible plate and screw fixation.  CT CERVICAL SPINE FINDINGS  Broad reversed cervical lordosis appear vertebral bodies and posterior elements are appear intact. Moderate to severe C5-6, C6-7 disc height loss, vacuum disc, endplate sclerosis and marginal spurring consistent with degenerative disc, moderate C3-4 and C4-5. C1-2 articulation maintained with mild arthropathy. C1-2 rotary scoliosis mild broad levoscoliosis. No destructive bony lesions. Prevertebral and paraspinal soft tissues are nonsuspicious.  Degenerative discs and facet arthropathy result in moderate to severe osseous canal stenosis C5-6, moderate at C6-7. Severe LEFT C3-4, bilateral C5-6 and C6-7 neural foraminal narrowing.  IMPRESSION: CT HEAD: No acute intracranial process.  Multifocal encephalomalacia is likely  posttraumatic.  Mild to moderate parenchymal brain volume loss for age, disproportionate cerebellar atrophy.  CT CERVICAL SPINE: Broad reversed cervical lordosis without acute fracture nor malalignment.  Moderate to severe osseous canal stenosis C5-6, moderate at C6-7. Severe LEFT C3-4, bilateral C5-6 and C6-7 neural foraminal narrowing.   Electronically Signed   By: Awilda Metroourtnay  Bloomer   On: 05/13/2015 01:49   Ct Cervical Spine Wo Contrast  05/13/2015   CLINICAL DATA:  Found down on ground outside store, possible seizure. Posterior head laceration. History of hypertension, seizure disorder, diabetes, skull fracture and subdural hematoma.  EXAM: CT HEAD WITHOUT CONTRAST  CT CERVICAL SPINE WITHOUT CONTRAST  TECHNIQUE: Multidetector CT imaging of the head and cervical spine was performed following the standard protocol without intravenous contrast. Multiplanar CT image reconstructions of the cervical spine were also generated.  COMPARISON:  CT of the head February 17, 2005  FINDINGS: CT HEAD FINDINGS  No intraparenchymal hemorrhage, mass effect, midline shift or acute large vascular territory infarct. Bifrontal, LEFT parietal encephalomalacia present on prior imaging. Mild to moderate ventriculomegaly, advanced for age with disproportionate prominence of the cerebellar folia.  No  abnormal extra-axial fluid collections. Basal cisterns are patent.  Small RIGHT parietal scalp hematoma. No acute skull fracture. Remote bilateral medial orbital blowout fractures. Remote depressed LEFT zygomatic arch fracture. RIGHT mandible plate and screw fixation.  CT CERVICAL SPINE FINDINGS  Broad reversed cervical lordosis appear vertebral bodies and posterior elements are appear intact. Moderate to severe C5-6, C6-7 disc height loss, vacuum disc, endplate sclerosis and marginal spurring consistent with degenerative disc, moderate C3-4 and C4-5. C1-2 articulation maintained with mild arthropathy. C1-2 rotary scoliosis mild broad  levoscoliosis. No destructive bony lesions. Prevertebral and paraspinal soft tissues are nonsuspicious.  Degenerative discs and facet arthropathy result in moderate to severe osseous canal stenosis C5-6, moderate at C6-7. Severe LEFT C3-4, bilateral C5-6 and C6-7 neural foraminal narrowing.  IMPRESSION: CT HEAD: No acute intracranial process.  Multifocal encephalomalacia is likely posttraumatic.  Mild to moderate parenchymal brain volume loss for age, disproportionate cerebellar atrophy.  CT CERVICAL SPINE: Broad reversed cervical lordosis without acute fracture nor malalignment.  Moderate to severe osseous canal stenosis C5-6, moderate at C6-7. Severe LEFT C3-4, bilateral C5-6 and C6-7 neural foraminal narrowing.   Electronically Signed   By: Awilda Metroourtnay  Bloomer   On: 05/13/2015 01:49    Microbiology: No results found for this or any previous visit (from the past 240 hour(s)).   Labs: Basic Metabolic Panel:  Recent Labs Lab 05/12/15 2308 05/13/15 1028 05/14/15 0555  NA 140 143 145  K 3.3* 3.0* 3.8  CL 117* 118* 123*  CO2 16* 19* 18*  GLUCOSE 91 183* 90  BUN <5* <5* <5*  CREATININE 0.55* 0.57* 0.58*  CALCIUM 7.5* 7.2* 7.3*  MG  --  1.7  --    Liver Function Tests:  Recent Labs Lab 05/13/15 1028 05/14/15 0555  AST 32 45*  ALT 21 22  ALKPHOS 227* 213*  BILITOT 0.4 0.5  PROT 5.4* 5.0*  ALBUMIN 1.7* 1.6*   No results for input(s): LIPASE, AMYLASE in the last 168 hours.  Recent Labs Lab 05/13/15 1028  AMMONIA 38*   CBC:  Recent Labs Lab 05/12/15 2308 05/13/15 1028 05/14/15 0555  WBC 6.2 4.6 4.8  NEUTROABS 2.5 2.3  --   HGB 11.4* 11.2* 10.0*  HCT 34.2* 33.7* 29.6*  MCV 90.7 91.1 91.6  PLT 233 232 198   Cardiac Enzymes: No results for input(s): CKTOTAL, CKMB, CKMBINDEX, TROPONINI in the last 168 hours. BNP: BNP (last 3 results) No results for input(s): BNP in the last 8760 hours.  ProBNP (last 3 results) No results for input(s): PROBNP in the last 8760  hours.  CBG:  Recent Labs Lab 05/13/15 2112 05/13/15 2145 05/13/15 2220 05/14/15 0734 05/14/15 1130  GLUCAP 62* 54* 144* 78 184*    Signed:  Shuntel Fishburn K  [facility] 05/14/2015, 4:48 PM

## 2015-06-13 ENCOUNTER — Emergency Department (HOSPITAL_COMMUNITY)
Admission: EM | Admit: 2015-06-13 | Discharge: 2015-06-13 | Discharge status: Against Medical Advice | Payer: Self-pay | Attending: Emergency Medicine | Admitting: Emergency Medicine

## 2015-06-13 ENCOUNTER — Encounter (HOSPITAL_COMMUNITY): Payer: Self-pay | Admitting: Emergency Medicine

## 2015-06-13 DIAGNOSIS — G40909 Epilepsy, unspecified, not intractable, without status epilepticus: Secondary | ICD-10-CM | POA: Insufficient documentation

## 2015-06-13 DIAGNOSIS — E860 Dehydration: Secondary | ICD-10-CM | POA: Insufficient documentation

## 2015-06-13 DIAGNOSIS — F1010 Alcohol abuse, uncomplicated: Secondary | ICD-10-CM | POA: Insufficient documentation

## 2015-06-13 DIAGNOSIS — I10 Essential (primary) hypertension: Secondary | ICD-10-CM | POA: Insufficient documentation

## 2015-06-13 DIAGNOSIS — Z79899 Other long term (current) drug therapy: Secondary | ICD-10-CM | POA: Insufficient documentation

## 2015-06-13 DIAGNOSIS — Z87442 Personal history of urinary calculi: Secondary | ICD-10-CM | POA: Insufficient documentation

## 2015-06-13 DIAGNOSIS — E119 Type 2 diabetes mellitus without complications: Secondary | ICD-10-CM | POA: Insufficient documentation

## 2015-06-13 DIAGNOSIS — Z720 Tobacco use: Secondary | ICD-10-CM | POA: Insufficient documentation

## 2015-06-13 DIAGNOSIS — Z9119 Patient's noncompliance with other medical treatment and regimen: Secondary | ICD-10-CM | POA: Insufficient documentation

## 2015-06-13 LAB — COMPREHENSIVE METABOLIC PANEL
ALT: 121 U/L — ABNORMAL HIGH (ref 17–63)
AST: 233 U/L — ABNORMAL HIGH (ref 15–41)
Albumin: 2.3 g/dL — ABNORMAL LOW (ref 3.5–5.0)
Alkaline Phosphatase: 238 U/L — ABNORMAL HIGH (ref 38–126)
Anion gap: 6 (ref 5–15)
BUN: 10 mg/dL (ref 6–20)
CALCIUM: 7.6 mg/dL — AB (ref 8.9–10.3)
CO2: 18 mmol/L — AB (ref 22–32)
Chloride: 109 mmol/L (ref 101–111)
Creatinine, Ser: 0.77 mg/dL (ref 0.61–1.24)
GFR calc Af Amer: >60 mL/min (ref >60)
GLUCOSE: 315 mg/dL — AB (ref 65–99)
Potassium: 3.7 mmol/L (ref 3.5–5.1)
SODIUM: 133 mmol/L — AB (ref 135–145)
Total Bilirubin: 1.8 mg/dL — ABNORMAL HIGH (ref 0.3–1.2)
Total Protein: 6.1 g/dL — ABNORMAL LOW (ref 6.5–8.1)

## 2015-06-13 LAB — ETHANOL: Alcohol, Ethyl (B): 223 mg/dL — ABNORMAL HIGH (ref <5)

## 2015-06-13 LAB — CBC WITH DIFFERENTIAL/PLATELET
Basophils Absolute: 0 10*3/uL (ref 0.0–0.1)
Basophils Relative: 0 % (ref 0–1)
Eosinophils Absolute: 0.1 10*3/uL (ref 0.0–0.7)
Eosinophils Relative: 2 % (ref 0–5)
HCT: 31.2 % — ABNORMAL LOW (ref 39.0–52.0)
HEMOGLOBIN: 10.5 g/dL — AB (ref 13.0–17.0)
LYMPHS ABS: 1.4 10*3/uL (ref 0.7–4.0)
LYMPHS PCT: 35 % (ref 12–46)
MCH: 30.8 pg (ref 26.0–34.0)
MCHC: 33.7 g/dL (ref 30.0–36.0)
MCV: 91.5 fL (ref 78.0–100.0)
Monocytes Absolute: 0.3 10*3/uL (ref 0.1–1.0)
Monocytes Relative: 7 % (ref 3–12)
NEUTROS ABS: 2.2 10*3/uL (ref 1.7–7.7)
NEUTROS PCT: 56 % (ref 43–77)
Platelets: 141 10*3/uL — ABNORMAL LOW (ref 150–400)
RBC: 3.41 MIL/uL — ABNORMAL LOW (ref 4.22–5.81)
RDW: 16.8 % — ABNORMAL HIGH (ref 11.5–15.5)
WBC: 4 10*3/uL (ref 4.0–10.5)

## 2015-06-13 LAB — FOLATE: FOLATE: 6.5 ng/mL (ref >5.9)

## 2015-06-13 LAB — PHENYTOIN LEVEL, TOTAL: Phenytoin Lvl: <2.5 ug/mL — ABNORMAL LOW (ref 10.0–20.0)

## 2015-06-13 MED ORDER — SODIUM CHLORIDE 0.9 % IV BOLUS (SEPSIS)
1000.0000 mL | Freq: Once | INTRAVENOUS | Status: AC
  Administered 2015-06-13: 1000 mL via INTRAVENOUS

## 2015-06-13 MED ORDER — ONDANSETRON HCL 4 MG/2ML IJ SOLN
4.0000 mg | Freq: Once | INTRAMUSCULAR | Status: AC
  Administered 2015-06-13: 4 mg via INTRAVENOUS
  Filled 2015-06-13: qty 2

## 2015-06-13 MED ORDER — THIAMINE HCL 100 MG/ML IJ SOLN
100.0000 mg | Freq: Once | INTRAMUSCULAR | Status: AC
  Administered 2015-06-13: 100 mg via INTRAVENOUS
  Filled 2015-06-13: qty 2

## 2015-06-13 MED ORDER — LORAZEPAM 2 MG/ML IJ SOLN
0.5000 mg | Freq: Once | INTRAMUSCULAR | Status: AC
  Administered 2015-06-13: 0.5 mg via INTRAVENOUS
  Filled 2015-06-13: qty 1

## 2015-06-13 NOTE — ED Notes (Signed)
Pt smoking in room. Security at bedside. Pt upset. Pt pulled out IV. Bandage placed on pt. EDP at bedside. Pt left AMA.

## 2015-06-13 NOTE — ED Notes (Signed)
Pt staggering around in room with a cigarette in his mouth. States he is going to take his IV out and go out to smoke. Pt instructed to take the cigarette out of his mouth and if he went out he would have to sign out AMA

## 2015-06-13 NOTE — ED Provider Notes (Signed)
TIME SEEN: 4:45 PM  CHIEF COMPLAINT: "I felt like I was going to have a seizure today"  HPI: Pt is a 49 y.o. male with history of hypertension, hyperlipidemia, alcohol and cocaine abuse with withdrawal seizures who is on Dilantin but is noncompliant with his medications who presents to the emergency department feeling like he may have a seizure today. He states that his last drink was yesterday. Denies drug use. States he has had some nausea. CBG with EMS was 115.  No fever, head injury, cough, vomiting or diarrhea. No chest or abdominal pain. No headache. He is requesting something to eat.  ROS: See HPI Constitutional: no fever  Eyes: no drainage  ENT: no runny nose   Cardiovascular:  no chest pain  Resp: no SOB  GI: no vomiting GU: no dysuria Integumentary: no rash  Allergy: no hives  Musculoskeletal: no leg swelling  Neurological: no slurred speech ROS otherwise negative  PAST MEDICAL HISTORY/PAST SURGICAL HISTORY:  Past Medical History  Diagnosis Date  . Pancreatitis, acute     First episode earlier in 2012, hospitalized again in 02/2012 and 09/2012  . Hypertension     noncompliance  . Hyperlipidemia     noncompliance  . Lung collapse     h/o  . ETOH abuse   . Cocaine abuse   . DTs (delirium tremens)     history of  . Tobacco abuse   . Seizure disorder 07/17/2013  . Noncompliance 07/17/2013  . Traumatic subdural hematoma 06/15/2013  . 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 Open skull fracture 06/15/2013  . Polysubstance abuse     etoh, cocaine  . Seizures   . Drug-seeking behavior   . Diabetes mellitus without complication   . Nephrolithiasis     MEDICATIONS:  Prior to Admission medications   Medication Sig Start Date End Date Taking? Authorizing Provider  amLODipine (NORVASC) 10 MG tablet Take 1 tablet (10 mg total) by mouth daily. 05/14/15   Jerald Kief, MD  phenytoin (DILANTIN) 100 MG ER capsule Take 1 capsule (100 mg total) by mouth 3 (three) times  daily. Patient not taking: Reported on 05/13/2015 04/27/15   Vanetta Mulders, MD  phenytoin (DILANTIN) 100 MG ER capsule Take 1 capsule (100 mg total) by mouth 3 (three) times daily. 05/14/15   Jerald Kief, MD  potassium chloride (K-DUR) 10 MEQ tablet Take 1 tablet (10 mEq total) by mouth 2 (two) times daily. 04/27/15   Vanetta Mulders, MD    ALLERGIES:  No Known Allergies  SOCIAL HISTORY:  History  Substance Use Topics  . Smoking status: Current Every Day Smoker -- 1.00 packs/day for 20 years    Types: Cigarettes  . Smokeless tobacco: Current User  . Alcohol Use: Yes     Comment: occ    FAMILY HISTORY: Family History  Problem Relation Age of Onset  . Diabetes type II Mother   . Liver disease Neg Hx   . Colon cancer Neg Hx   . GI problems Neg Hx     EXAM: BP 126/88 mmHg  Pulse 105  Temp(Src) 98.4 F (36.9 C)  Resp 20  Ht 6' (1.829 m)  Wt 160 lb (72.576 kg)  BMI 21.70 kg/m2  SpO2 98% CONSTITUTIONAL: Alert and oriented and responds appropriately to questions. Well-appearing; well-nourished, appears chronically ill but in no distress, smells of alcohol HEAD: Normocephalic EYES: Conjunctivae clear, PERRL ENT: normal nose; no rhinorrhea; moist mucous membranes; pharynx without lesions noted NECK: Supple, no meningismus, no  LAD  CARD: RRR; S1 and S2 appreciated; no murmurs, no clicks, no rubs, no gallops RESP: Normal chest excursion without splinting or tachypnea; breath sounds clear and equal bilaterally; no wheezes, no rhonchi, no rales, no hypoxia or respiratory distress, speaking full sentences ABD/GI: Normal bowel sounds; non-distended; soft, non-tender, no rebound, no guarding, no peritoneal signs BACK:  The back appears normal and is non-tender to palpation, there is no CVA tenderness EXT: Normal ROM in all joints; non-tender to palpation; no edema; normal capillary refill; no cyanosis, no calf tenderness or swelling    SKIN: Normal color for age and race; warm NEURO:  Moves all extremities equally, sensation to light touch intact diffusely, cranial nerves II through XII intact PSYCH: The patient's mood and manner are appropriate. Grooming and personal hygiene are appropriate.  MEDICAL DECISION MAKING: Patient here stating that he feels like he may have a seizure. Has a history of alcohol withdrawal seizures. Noncompliant with Dilantin. Will obtain labs, urine. Will give IV fluids, thiamine and folate. We'll give a very small dose of Ativan in the ED given I feel he still has alcohol in his system.  ED PROGRESS: Patient's labs are unremarkable other than a bicarbonate of 18, elevated LFTs. Alcohol level is 223. Dilantin level is undetectable. Patient has received IV fluids in the ED. I feel his metabolic acidosis is likely secondary to dehydration. His anion gap is normal. Blood glucose is high at 3:15 but I do not think he is in DKA. He refuses to stay for further evaluation, hydration. He wants to leave the hospital AGAINST MEDICAL ADVICE. He is able to ambulate, tolerate by mouth without difficulty. Discussed return precautions. He verbalizes understanding and is comfortable with this plan.     Layla Maw Zayli Villafuerte, DO 06/13/15 1812

## 2015-06-13 NOTE — ED Notes (Addendum)
Pt called EMS for "feeling like he was going to have a seizure". Pt staggered from EMS stretcher to ED stretcher. Appears intoxicated, has history of same. CBG-115 per EMS.

## 2015-06-19 ENCOUNTER — Encounter (HOSPITAL_COMMUNITY): Payer: Self-pay | Admitting: Emergency Medicine

## 2015-06-19 ENCOUNTER — Emergency Department (HOSPITAL_COMMUNITY)
Admission: EM | Admit: 2015-06-19 | Discharge: 2015-06-19 | Discharge status: Against Medical Advice | Payer: Medicaid Other | Attending: Emergency Medicine | Admitting: Emergency Medicine

## 2015-06-19 DIAGNOSIS — G40909 Epilepsy, unspecified, not intractable, without status epilepticus: Secondary | ICD-10-CM | POA: Diagnosis not present

## 2015-06-19 DIAGNOSIS — E1165 Type 2 diabetes mellitus with hyperglycemia: Secondary | ICD-10-CM | POA: Diagnosis not present

## 2015-06-19 DIAGNOSIS — Z8659 Personal history of other mental and behavioral disorders: Secondary | ICD-10-CM | POA: Insufficient documentation

## 2015-06-19 DIAGNOSIS — I10 Essential (primary) hypertension: Secondary | ICD-10-CM | POA: Diagnosis not present

## 2015-06-19 DIAGNOSIS — Z8709 Personal history of other diseases of the respiratory system: Secondary | ICD-10-CM | POA: Diagnosis not present

## 2015-06-19 DIAGNOSIS — Z9119 Patient's noncompliance with other medical treatment and regimen: Secondary | ICD-10-CM | POA: Diagnosis not present

## 2015-06-19 DIAGNOSIS — Z8719 Personal history of other diseases of the digestive system: Secondary | ICD-10-CM | POA: Diagnosis not present

## 2015-06-19 DIAGNOSIS — Z87442 Personal history of urinary calculi: Secondary | ICD-10-CM | POA: Diagnosis not present

## 2015-06-19 DIAGNOSIS — Z87828 Personal history of other (healed) physical injury and trauma: Secondary | ICD-10-CM | POA: Insufficient documentation

## 2015-06-19 DIAGNOSIS — Z720 Tobacco use: Secondary | ICD-10-CM | POA: Diagnosis not present

## 2015-06-19 DIAGNOSIS — Z7289 Other problems related to lifestyle: Secondary | ICD-10-CM | POA: Diagnosis not present

## 2015-06-19 DIAGNOSIS — Z8781 Personal history of (healed) traumatic fracture: Secondary | ICD-10-CM | POA: Diagnosis not present

## 2015-06-19 DIAGNOSIS — Z79899 Other long term (current) drug therapy: Secondary | ICD-10-CM | POA: Insufficient documentation

## 2015-06-19 DIAGNOSIS — R739 Hyperglycemia, unspecified: Secondary | ICD-10-CM

## 2015-06-19 LAB — CBC WITH DIFFERENTIAL/PLATELET
BASOS ABS: 0 10*3/uL (ref 0.0–0.1)
BASOS PCT: 0 % (ref 0–1)
Eosinophils Absolute: 0 10*3/uL (ref 0.0–0.7)
Eosinophils Relative: 1 % (ref 0–5)
HEMATOCRIT: 30.5 % — AB (ref 39.0–52.0)
HEMOGLOBIN: 10.4 g/dL — AB (ref 13.0–17.0)
LYMPHS ABS: 1.4 10*3/uL (ref 0.7–4.0)
LYMPHS PCT: 41 % (ref 12–46)
MCH: 30.4 pg (ref 26.0–34.0)
MCHC: 34.1 g/dL (ref 30.0–36.0)
MCV: 89.2 fL (ref 78.0–100.0)
MONOS PCT: 6 % (ref 3–12)
Monocytes Absolute: 0.2 10*3/uL (ref 0.1–1.0)
NEUTROS ABS: 1.9 10*3/uL (ref 1.7–7.7)
Neutrophils Relative %: 52 % (ref 43–77)
Platelets: 143 10*3/uL — ABNORMAL LOW (ref 150–400)
RBC: 3.42 MIL/uL — ABNORMAL LOW (ref 4.22–5.81)
RDW: 17.7 % — ABNORMAL HIGH (ref 11.5–15.5)
WBC: 3.5 10*3/uL — AB (ref 4.0–10.5)

## 2015-06-19 LAB — BASIC METABOLIC PANEL
ANION GAP: 9 (ref 5–15)
BUN: 13 mg/dL (ref 6–20)
CALCIUM: 7.5 mg/dL — AB (ref 8.9–10.3)
CO2: 23 mmol/L (ref 22–32)
CREATININE: 0.95 mg/dL (ref 0.61–1.24)
Chloride: 110 mmol/L (ref 101–111)
Glucose, Bld: 337 mg/dL — ABNORMAL HIGH (ref 65–99)
Potassium: 3.5 mmol/L (ref 3.5–5.1)
SODIUM: 142 mmol/L (ref 135–145)

## 2015-06-19 LAB — CBG MONITORING, ED: Glucose-Capillary: 349 mg/dL — ABNORMAL HIGH (ref 65–99)

## 2015-06-19 MED ORDER — LORAZEPAM 2 MG/ML IJ SOLN
1.0000 mg | Freq: Once | INTRAMUSCULAR | Status: AC
  Administered 2015-06-19: 1 mg via INTRAVENOUS
  Filled 2015-06-19: qty 1

## 2015-06-19 MED ORDER — SODIUM CHLORIDE 0.9 % IV BOLUS (SEPSIS)
1000.0000 mL | Freq: Once | INTRAVENOUS | Status: AC
  Administered 2015-06-19: 1000 mL via INTRAVENOUS

## 2015-06-19 NOTE — ED Notes (Signed)
Pt states he is not staying.  Removed IV and pt left ambulatory.

## 2015-06-19 NOTE — ED Notes (Signed)
Pt sleeping soundly at this time.

## 2015-06-19 NOTE — ED Notes (Signed)
Pt continues to sleep soundly.

## 2015-06-19 NOTE — ED Notes (Signed)
Pt returned to ED where IV was removed.

## 2015-06-19 NOTE — ED Notes (Signed)
Pt has removed monitor and is eating at present.  States that "I need all this shit off so I can get [place] here."

## 2015-06-19 NOTE — ED Notes (Signed)
Pt left via fire escape.  Security went and found pt and bought him back to the er.

## 2015-06-19 NOTE — ED Notes (Signed)
Patient with c/o hyperglycemia. Patient was [place] [employer] [place] [employer] to be laying on the ground and called EMS. Patient alert. H/o of non-compliance with medications for seizure and DM

## 2015-06-20 NOTE — ED Provider Notes (Signed)
CSN: [id number]     Arrival date & time 06/19/15  1340 History   First MD Initiated Contact with Patient 06/19/15 1403     Chief Complaint  Patient presents with  . Hyperglycemia     (Consider location/radiation/quality/duration/timing/severity/associated sxs/prior Treatment) HPI..... Patient states he feels like he may have a seizure. Also is concerned about his elevated glucose. No prodromal illnesses. No fever, chills, stiff neck, neurological deficits. He has a history of alcohol abuse and polysubstance abuse. Her primary care relationship.  Past Medical History  Diagnosis Date  . Pancreatitis, acute     First episode earlier in 2012, hospitalized again in 02/2012 and 09/2012  . Hypertension     noncompliance  . Hyperlipidemia     noncompliance  . Lung collapse     h/o  . ETOH abuse   . Cocaine abuse   . DTs (delirium tremens)     history of  . Tobacco abuse   . Seizure disorder 07/17/2013  . Noncompliance 07/17/2013  . Traumatic subdural hematoma 06/15/2013  . 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 Open skull fracture 06/15/2013  . Polysubstance abuse     etoh, cocaine  . Seizures   . Drug-seeking behavior   . Diabetes mellitus without complication   . Nephrolithiasis    Past Surgical History  Procedure Laterality Date  . Mandible fracture surgery  2006  . Left axillary      surgery for deep laceration   Family History  Problem Relation Age of Onset  . Diabetes type II Mother   . Liver disease Neg Hx   . Colon cancer Neg Hx   . GI problems Neg Hx    History  Substance Use Topics  . Smoking status: Current Every Day Smoker -- 1.00 packs/day for 20 years    Types: Cigarettes  . Smokeless tobacco: Current User  . Alcohol Use: Yes     Comment: occ    Review of Systems  All other systems reviewed and are negative.     Allergies  Review of patient's allergies indicates no known allergies.  Home Medications   Prior to Admission medications    Medication Sig Start Date End Date Taking? Authorizing Provider  amLODipine (NORVASC) 10 MG tablet Take 1 tablet (10 mg total) by mouth daily. Patient not taking: Reported on 06/19/2015 05/14/15   Jerald Kief, MD  phenytoin (DILANTIN) 100 MG ER capsule Take 1 capsule (100 mg total) by mouth 3 (three) times daily. Patient not taking: Reported on 06/19/2015 05/14/15   Jerald Kief, MD  potassium chloride (K-DUR) 10 MEQ tablet Take 1 tablet (10 mEq total) by mouth 2 (two) times daily. Patient not taking: Reported on 06/19/2015 04/27/15   Vanetta Mulders, MD   BP 118/88 mmHg  Pulse 90  Temp(Src) 97.8 F (36.6 C) (Oral)  Resp 16  SpO2 100% Physical Exam  Constitutional: He is oriented to person, place, and time.  Alert, ambulatory, no neuro deficits.  HENT:  Head: Normocephalic and atraumatic.  Eyes: Conjunctivae and EOM are normal. Pupils are equal, round, and reactive to light.  Neck: Normal range of motion. Neck supple.  Cardiovascular: Normal rate and regular rhythm.   Pulmonary/Chest: Effort normal and breath sounds normal.  Abdominal: Soft. Bowel sounds are normal.  Musculoskeletal: Normal range of motion.  Neurological: He is alert and oriented to person, place, and time.  Skin: Skin is warm and dry.  Psychiatric: He has a normal mood and affect. His behavior is  normal.  Nursing note and vitals reviewed.   ED Course  Procedures (including critical care time) Labs Review Labs Reviewed  BASIC METABOLIC PANEL - Abnormal; Notable for the following:    Glucose, Bld 337 (*)    Calcium 7.5 (*)    All other components within normal limits  CBC WITH DIFFERENTIAL/PLATELET - Abnormal; Notable for the following:    WBC 3.5 (*)    RBC 3.42 (*)    Hemoglobin 10.4 (*)    HCT 30.5 (*)    RDW 17.7 (*)    Platelets 143 (*)    All other components within normal limits  CBG MONITORING, ED - Abnormal; Notable for the following:    Glucose-Capillary 349 (*)    All other components  within normal limits    Imaging Review No results found.   EKG Interpretation None      MDM   Final diagnoses:  Hyperglycemia    Patient is in no acute distress. Glucose 337. Patient left AMA without my knowledge.    Donnetta Hutching, MD 06/20/15 2110

## 2015-06-22 ENCOUNTER — Emergency Department (HOSPITAL_COMMUNITY)
Admission: EM | Admit: 2015-06-22 | Discharge: 2015-06-22 | Discharge status: Against Medical Advice | Payer: Medicaid Other | Attending: Emergency Medicine | Admitting: Emergency Medicine

## 2015-06-22 ENCOUNTER — Encounter (HOSPITAL_COMMUNITY): Payer: Self-pay | Admitting: Emergency Medicine

## 2015-06-22 DIAGNOSIS — R109 Unspecified abdominal pain: Secondary | ICD-10-CM | POA: Diagnosis present

## 2015-06-22 DIAGNOSIS — Z87442 Personal history of urinary calculi: Secondary | ICD-10-CM | POA: Insufficient documentation

## 2015-06-22 DIAGNOSIS — I10 Essential (primary) hypertension: Secondary | ICD-10-CM | POA: Insufficient documentation

## 2015-06-22 DIAGNOSIS — R112 Nausea with vomiting, unspecified: Secondary | ICD-10-CM | POA: Diagnosis not present

## 2015-06-22 DIAGNOSIS — Z8781 Personal history of (healed) traumatic fracture: Secondary | ICD-10-CM | POA: Diagnosis not present

## 2015-06-22 DIAGNOSIS — R1084 Generalized abdominal pain: Secondary | ICD-10-CM | POA: Diagnosis not present

## 2015-06-22 DIAGNOSIS — E119 Type 2 diabetes mellitus without complications: Secondary | ICD-10-CM | POA: Insufficient documentation

## 2015-06-22 DIAGNOSIS — Z8719 Personal history of other diseases of the digestive system: Secondary | ICD-10-CM | POA: Insufficient documentation

## 2015-06-22 DIAGNOSIS — G40909 Epilepsy, unspecified, not intractable, without status epilepticus: Secondary | ICD-10-CM | POA: Insufficient documentation

## 2015-06-22 DIAGNOSIS — Z9119 Patient's noncompliance with other medical treatment and regimen: Secondary | ICD-10-CM | POA: Insufficient documentation

## 2015-06-22 DIAGNOSIS — Z8709 Personal history of other diseases of the respiratory system: Secondary | ICD-10-CM | POA: Insufficient documentation

## 2015-06-22 DIAGNOSIS — Z4802 Encounter for removal of sutures: Secondary | ICD-10-CM | POA: Diagnosis not present

## 2015-06-22 DIAGNOSIS — Z720 Tobacco use: Secondary | ICD-10-CM | POA: Diagnosis not present

## 2015-06-22 LAB — COMPREHENSIVE METABOLIC PANEL
ALBUMIN: 1.9 g/dL — AB (ref 3.5–5.0)
ALK PHOS: 255 U/L — AB (ref 38–126)
ALT: 46 U/L (ref 17–63)
AST: 49 U/L — AB (ref 15–41)
Anion gap: 11 (ref 5–15)
BILIRUBIN TOTAL: 5.6 mg/dL — AB (ref 0.3–1.2)
BUN: 10 mg/dL (ref 6–20)
CO2: 20 mmol/L — AB (ref 22–32)
CREATININE: 1 mg/dL (ref 0.61–1.24)
Calcium: 7.8 mg/dL — ABNORMAL LOW (ref 8.9–10.3)
Chloride: 102 mmol/L (ref 101–111)
GFR calc Af Amer: >60 mL/min (ref >60)
GFR calc non Af Amer: >60 mL/min (ref >60)
GLUCOSE: 331 mg/dL — AB (ref 65–99)
Potassium: 3 mmol/L — ABNORMAL LOW (ref 3.5–5.1)
Sodium: 133 mmol/L — ABNORMAL LOW (ref 135–145)
Total Protein: 5.5 g/dL — ABNORMAL LOW (ref 6.5–8.1)

## 2015-06-22 LAB — CBC WITH DIFFERENTIAL/PLATELET
Basophils Absolute: 0 10*3/uL (ref 0.0–0.1)
Basophils Relative: 0 % (ref 0–1)
Eosinophils Absolute: 0 10*3/uL (ref 0.0–0.7)
Eosinophils Relative: 0 % (ref 0–5)
HEMATOCRIT: 26.3 % — AB (ref 39.0–52.0)
Hemoglobin: 9.5 g/dL — ABNORMAL LOW (ref 13.0–17.0)
LYMPHS ABS: 1.5 10*3/uL (ref 0.7–4.0)
LYMPHS PCT: 41 % (ref 12–46)
MCH: 31.1 pg (ref 26.0–34.0)
MCHC: 36.1 g/dL — ABNORMAL HIGH (ref 30.0–36.0)
MCV: 86.2 fL (ref 78.0–100.0)
MONO ABS: 0.2 10*3/uL (ref 0.1–1.0)
MONOS PCT: 5 % (ref 3–12)
NEUTROS ABS: 2 10*3/uL (ref 1.7–7.7)
Neutrophils Relative %: 54 % (ref 43–77)
Platelets: 98 10*3/uL — ABNORMAL LOW (ref 150–400)
RBC: 3.05 MIL/uL — AB (ref 4.22–5.81)
RDW: 17.4 % — ABNORMAL HIGH (ref 11.5–15.5)
WBC: 3.6 10*3/uL — ABNORMAL LOW (ref 4.0–10.5)

## 2015-06-22 LAB — ETHANOL: Alcohol, Ethyl (B): 159 mg/dL — ABNORMAL HIGH (ref <5)

## 2015-06-22 LAB — PHENYTOIN LEVEL, TOTAL

## 2015-06-22 LAB — LIPASE, BLOOD: LIPASE: 15 U/L — AB (ref 22–51)

## 2015-06-22 MED ORDER — SODIUM CHLORIDE 0.9 % IV BOLUS (SEPSIS)
1000.0000 mL | Freq: Once | INTRAVENOUS | Status: AC
  Administered 2015-06-22: 1000 mL via INTRAVENOUS

## 2015-06-22 MED ORDER — PANTOPRAZOLE SODIUM 40 MG IV SOLR
40.0000 mg | Freq: Once | INTRAVENOUS | Status: AC
  Administered 2015-06-22: 40 mg via INTRAVENOUS
  Filled 2015-06-22: qty 40

## 2015-06-22 MED ORDER — ONDANSETRON HCL 4 MG/2ML IJ SOLN
4.0000 mg | Freq: Once | INTRAMUSCULAR | Status: AC
  Administered 2015-06-22: 4 mg via INTRAVENOUS
  Filled 2015-06-22: qty 2

## 2015-06-22 MED ORDER — SODIUM CHLORIDE 0.9 % IV SOLN: INTRAVENOUS | Status: DC

## 2015-06-22 NOTE — ED Provider Notes (Signed)
CSN: [id number]     Arrival date & time 06/22/15  1249 History   First MD Initiated Contact with Patient 06/22/15 1347     Chief Complaint  Patient presents with  . Suture / Staple Removal  . Abdominal Pain     (Consider location/radiation/quality/duration/timing/severity/associated sxs/prior Treatment) Patient is a 49 y.o. male presenting with suture removal and abdominal pain. The history is provided by the patient.  Suture / Staple Removal Associated symptoms include abdominal pain. Pertinent negatives include no chest pain, no headaches and no shortness of breath.  Abdominal Pain Associated symptoms: no chest pain, no dysuria and no shortness of breath    patient presented the emergency department was here yesterday and left AMA. Patient back to have sutures removed and replaced on May 13. Patient also complained of abdominal pain cramping in his hands nausea and vomiting. Patient known to have a long-standing history of substance abuse and alcohol abuse. Patient states abdominal pain is 8 out of 10. Bilateral cramping to both hands. And that he needs to staples taken out. Correction patient was seen of June 20 left AMA also seen June 14 left AMA.  Past Medical History  Diagnosis Date  . Pancreatitis, acute     First episode earlier in 2012, hospitalized again in 02/2012 and 09/2012  . Hypertension     noncompliance  . Hyperlipidemia     noncompliance  . Lung collapse     h/o  . ETOH abuse   . Cocaine abuse   . DTs (delirium tremens)     history of  . Tobacco abuse   . Seizure disorder 07/17/2013  . Noncompliance 07/17/2013  . Traumatic subdural hematoma 06/15/2013  . 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 Open skull fracture 06/15/2013  . Polysubstance abuse     etoh, cocaine  . Seizures   . Drug-seeking behavior   . Diabetes mellitus without complication   . Nephrolithiasis    Past Surgical History  Procedure Laterality Date  . Mandible fracture surgery  2006   . Left axillary      surgery for deep laceration   Family History  Problem Relation Age of Onset  . Diabetes type II Mother   . Liver disease Neg Hx   . Colon cancer Neg Hx   . GI problems Neg Hx    History  Substance Use Topics  . Smoking status: Current Every Day Smoker -- 1.00 packs/day for 20 years    Types: Cigarettes  . Smokeless tobacco: Current User  . Alcohol Use: Yes     Comment: occ    Review of Systems  HENT: Negative for congestion.   Eyes: Negative for visual disturbance.  Respiratory: Negative for shortness of breath.   Cardiovascular: Negative for chest pain.  Gastrointestinal: Positive for abdominal pain.  Genitourinary: Negative for dysuria.  Musculoskeletal: Negative for back pain.  Skin: Positive for wound. Negative for rash.  Neurological: Negative for seizures and headaches.  Hematological: Does not bruise/bleed easily.  Psychiatric/Behavioral: Negative for confusion.      Allergies  Review of patient's allergies indicates no known allergies.  Home Medications   Prior to Admission medications   Medication Sig Start Date End Date Taking? Authorizing Provider  amLODipine (NORVASC) 10 MG tablet Take 1 tablet (10 mg total) by mouth daily. Patient not taking: Reported on 06/19/2015 05/14/15   Jerald Kief, MD  phenytoin (DILANTIN) 100 MG ER capsule Take 1 capsule (100 mg total) by mouth 3 (three) times daily.  Patient not taking: Reported on 06/19/2015 05/14/15   Jerald Kief, MD  potassium chloride (K-DUR) 10 MEQ tablet Take 1 tablet (10 mEq total) by mouth 2 (two) times daily. Patient not taking: Reported on 06/19/2015 04/27/15   Vanetta Mulders, MD   BP 133/97 mmHg  Pulse 81  Temp(Src) 97.9 F (36.6 C) (Oral)  Resp 18  Ht 6' (1.829 m)  Wt 160 lb (72.576 kg)  BMI 21.70 kg/m2  SpO2 100% Physical Exam  Constitutional: He is oriented to person, place, and time. He appears well-developed and well-nourished. No distress.  HENT:  Head:  Normocephalic and atraumatic.  Mouth/Throat: Oropharynx is clear and moist.  Well-healed the laceration to the occiput area with the 4 staples present. No evidence of any secondary infection.  Eyes: Conjunctivae and EOM are normal. Pupils are equal, round, and reactive to light.  Cardiovascular: Normal rate, regular rhythm and normal heart sounds.   No murmur heard. Pulmonary/Chest: Effort normal and breath sounds normal. No respiratory distress.  Abdominal: Soft. Bowel sounds are normal. There is no tenderness.  Musculoskeletal: Normal range of motion.  Neurological: He is alert and oriented to person, place, and time. No cranial nerve deficit. He exhibits normal muscle tone. Coordination normal.  Skin: Skin is warm. No rash noted.  Nursing note and vitals reviewed.   ED Course  Procedures (including critical care time) Labs Review Labs Reviewed  COMPREHENSIVE METABOLIC PANEL - Abnormal; Notable for the following:    Sodium 133 (*)    Potassium 3.0 (*)    CO2 20 (*)    Glucose, Bld 331 (*)    Calcium 7.8 (*)    Total Protein 5.5 (*)    Albumin 1.9 (*)    AST 49 (*)    Alkaline Phosphatase 255 (*)    Total Bilirubin 5.6 (*)    All other components within normal limits  LIPASE, BLOOD - Abnormal; Notable for the following:    Lipase 15 (*)    All other components within normal limits  CBC WITH DIFFERENTIAL/PLATELET - Abnormal; Notable for the following:    WBC 3.6 (*)    RBC 3.05 (*)    Hemoglobin 9.5 (*)    HCT 26.3 (*)    MCHC 36.1 (*)    RDW 17.4 (*)    Platelets 98 (*)    All other components within normal limits  ETHANOL - Abnormal; Notable for the following:    Alcohol, Ethyl (B) 159 (*)    All other components within normal limits  PHENYTOIN LEVEL, TOTAL - Abnormal; Notable for the following:    Phenytoin Lvl <2.5 (*)    All other components within normal limits  URINE RAPID DRUG SCREEN, HOSP PERFORMED   Results for orders placed or performed during the hospital  encounter of 06/22/15  Comprehensive metabolic panel  Result Value Ref Range   Sodium 133 (L) 135 - 145 mmol/L   Potassium 3.0 (L) 3.5 - 5.1 mmol/L   Chloride 102 101 - 111 mmol/L   CO2 20 (L) 22 - 32 mmol/L   Glucose, Bld 331 (H) 65 - 99 mg/dL   BUN 10 6 - 20 mg/dL   Creatinine, Ser 1.61 0.61 - 1.24 mg/dL   Calcium 7.8 (L) 8.9 - 10.3 mg/dL   Total Protein 5.5 (L) 6.5 - 8.1 g/dL   Albumin 1.9 (L) 3.5 - 5.0 g/dL   AST 49 (H) 15 - 41 U/L   ALT 46 17 - 63 U/L  Alkaline Phosphatase 255 (H) 38 - 126 U/L   Total Bilirubin 5.6 (H) 0.3 - 1.2 mg/dL   GFR calc non Af Amer >60 >60 mL/min   GFR calc Af Amer >60 >60 mL/min   Anion gap 11 5 - 15  Lipase, blood  Result Value Ref Range   Lipase 15 (L) 22 - 51 U/L  CBC with Differential/Platelet  Result Value Ref Range   WBC 3.6 (L) 4.0 - 10.5 K/uL   RBC 3.05 (L) 4.22 - 5.81 MIL/uL   Hemoglobin 9.5 (L) 13.0 - 17.0 g/dL   HCT 16.1 (L) 09.6 - 04.5 %   MCV 86.2 78.0 - 100.0 fL   MCH 31.1 26.0 - 34.0 pg   MCHC 36.1 (H) 30.0 - 36.0 g/dL   RDW 40.9 (H) 81.1 - 91.4 %   Platelets 98 (L) 150 - 400 K/uL   Neutrophils Relative % 54 43 - 77 %   Neutro Abs 2.0 1.7 - 7.7 K/uL   Lymphocytes Relative 41 12 - 46 %   Lymphs Abs 1.5 0.7 - 4.0 K/uL   Monocytes Relative 5 3 - 12 %   Monocytes Absolute 0.2 0.1 - 1.0 K/uL   Eosinophils Relative 0 0 - 5 %   Eosinophils Absolute 0.0 0.0 - 0.7 K/uL   Basophils Relative 0 0 - 1 %   Basophils Absolute 0.0 0.0 - 0.1 K/uL  Ethanol  Result Value Ref Range   Alcohol, Ethyl (B) 159 (H) <5 mg/dL  Phenytoin level, total  Result Value Ref Range   Phenytoin Lvl <2.5 (L) 10.0 - 20.0 ug/mL    Imaging Review No results found.   EKG Interpretation None      MDM   Final diagnoses:  None    Patient came in with several complaints. One was that he wanted his staples removed that were put in over a month ago. We did get those out from nursing also was complaining of abdominal pain and nausea and vomiting and  feeling like he was given have a seizure. Patient once he had staples out wanted to leave AMA. All his labs were not back. Patient is known to have long-standing alcohol problem. That may have been a driving factor. Patient blood alcohol here today was only 159 which is usually low for him. Marketed of hyperglycemia we wanted to hydrate and to address that. In addition patient was out of his Dilantin. But patient insisted on leaving AMA before he could be appropriately discharged after his workup. Patient understood the risk of not having these things addressed.    Vanetta Mulders, MD 06/22/15 [phone]

## 2015-06-22 NOTE — ED Notes (Addendum)
Patient states "I need to get these staples out of my head." Patient states they have been there approximately "one or two months." No redness or drainage at site. Patient also states "My stomach hurts, I feel nauseated and like I'm going to have a seizure." Patient admits to drinking "about a pint of wine" today.

## 2015-06-22 NOTE — ED Notes (Signed)
4 staples removed from scalp ?

## 2015-06-23 ENCOUNTER — Encounter (HOSPITAL_COMMUNITY): Payer: Self-pay | Admitting: Emergency Medicine

## 2015-06-23 ENCOUNTER — Emergency Department (HOSPITAL_COMMUNITY)
Admission: EM | Admit: 2015-06-23 | Discharge: 2015-06-23 | Disposition: A | Discharge status: Home / Self Care | Payer: Medicaid Other | Attending: Emergency Medicine | Admitting: Emergency Medicine

## 2015-06-23 DIAGNOSIS — G40909 Epilepsy, unspecified, not intractable, without status epilepticus: Secondary | ICD-10-CM | POA: Insufficient documentation

## 2015-06-23 DIAGNOSIS — I10 Essential (primary) hypertension: Secondary | ICD-10-CM | POA: Diagnosis not present

## 2015-06-23 DIAGNOSIS — Z87442 Personal history of urinary calculi: Secondary | ICD-10-CM | POA: Insufficient documentation

## 2015-06-23 DIAGNOSIS — Z79899 Other long term (current) drug therapy: Secondary | ICD-10-CM | POA: Diagnosis not present

## 2015-06-23 DIAGNOSIS — Z9119 Patient's noncompliance with other medical treatment and regimen: Secondary | ICD-10-CM | POA: Diagnosis not present

## 2015-06-23 DIAGNOSIS — Z87828 Personal history of other (healed) physical injury and trauma: Secondary | ICD-10-CM | POA: Diagnosis not present

## 2015-06-23 DIAGNOSIS — F10920 Alcohol use, unspecified with intoxication, uncomplicated: Secondary | ICD-10-CM

## 2015-06-23 DIAGNOSIS — E785 Hyperlipidemia, unspecified: Secondary | ICD-10-CM | POA: Insufficient documentation

## 2015-06-23 DIAGNOSIS — Z720 Tobacco use: Secondary | ICD-10-CM | POA: Insufficient documentation

## 2015-06-23 DIAGNOSIS — Z8719 Personal history of other diseases of the digestive system: Secondary | ICD-10-CM | POA: Insufficient documentation

## 2015-06-23 DIAGNOSIS — E1165 Type 2 diabetes mellitus with hyperglycemia: Secondary | ICD-10-CM | POA: Diagnosis present

## 2015-06-23 DIAGNOSIS — F10120 Alcohol abuse with intoxication, uncomplicated: Secondary | ICD-10-CM | POA: Insufficient documentation

## 2015-06-23 DIAGNOSIS — R739 Hyperglycemia, unspecified: Secondary | ICD-10-CM

## 2015-06-23 DIAGNOSIS — D649 Anemia, unspecified: Secondary | ICD-10-CM

## 2015-06-23 LAB — BASIC METABOLIC PANEL
Anion gap: 6 (ref 5–15)
BUN: 10 mg/dL (ref 6–20)
CHLORIDE: 113 mmol/L — AB (ref 101–111)
CO2: 22 mmol/L (ref 22–32)
Calcium: 7.4 mg/dL — ABNORMAL LOW (ref 8.9–10.3)
Creatinine, Ser: 1.06 mg/dL (ref 0.61–1.24)
GFR calc non Af Amer: >60 mL/min (ref >60)
Glucose, Bld: 358 mg/dL — ABNORMAL HIGH (ref 65–99)
POTASSIUM: 3.9 mmol/L (ref 3.5–5.1)
SODIUM: 141 mmol/L (ref 135–145)

## 2015-06-23 LAB — CBG MONITORING, ED
GLUCOSE-CAPILLARY: 240 mg/dL — AB (ref 65–99)
Glucose-Capillary: 322 mg/dL — ABNORMAL HIGH (ref 65–99)

## 2015-06-23 LAB — CBC WITH DIFFERENTIAL/PLATELET
BASOS ABS: 0 10*3/uL (ref 0.0–0.1)
Basophils Relative: 0 % (ref 0–1)
EOS PCT: 1 % (ref 0–5)
Eosinophils Absolute: 0 10*3/uL (ref 0.0–0.7)
HCT: 24 % — ABNORMAL LOW (ref 39.0–52.0)
Hemoglobin: 8.7 g/dL — ABNORMAL LOW (ref 13.0–17.0)
Lymphocytes Relative: 39 % (ref 12–46)
Lymphs Abs: 1.5 10*3/uL (ref 0.7–4.0)
MCH: 31.1 pg (ref 26.0–34.0)
MCHC: 36.3 g/dL — AB (ref 30.0–36.0)
MCV: 85.7 fL (ref 78.0–100.0)
Monocytes Absolute: 0.2 10*3/uL (ref 0.1–1.0)
Monocytes Relative: 4 % (ref 3–12)
NEUTROS ABS: 2.1 10*3/uL (ref 1.7–7.7)
Neutrophils Relative %: 56 % (ref 43–77)
Platelets: 103 10*3/uL — ABNORMAL LOW (ref 150–400)
RBC: 2.8 MIL/uL — ABNORMAL LOW (ref 4.22–5.81)
RDW: 17.6 % — AB (ref 11.5–15.5)
WBC: 3.8 10*3/uL — ABNORMAL LOW (ref 4.0–10.5)

## 2015-06-23 LAB — ETHANOL: ALCOHOL ETHYL (B): 235 mg/dL — AB (ref <5)

## 2015-06-23 LAB — POC OCCULT BLOOD, ED: Fecal Occult Bld: NEGATIVE

## 2015-06-23 MED ORDER — SODIUM CHLORIDE 0.9 % IV BOLUS (SEPSIS)
1000.0000 mL | Freq: Once | INTRAVENOUS | Status: AC
  Administered 2015-06-23: 1000 mL via INTRAVENOUS

## 2015-06-23 MED ORDER — INSULIN ASPART 100 UNIT/ML ~~LOC~~ SOLN
6.0000 [IU] | Freq: Once | SUBCUTANEOUS | Status: AC
  Administered 2015-06-23: 6 [IU] via SUBCUTANEOUS
  Filled 2015-06-23: qty 1

## 2015-06-23 MED ORDER — ADULT MULTIVITAMIN W/MINERALS CH
1.0000 | ORAL_TABLET | Freq: Once | ORAL | Status: AC
  Administered 2015-06-23: 1 via ORAL
  Filled 2015-06-23: qty 1

## 2015-06-23 MED ORDER — VITAMIN B-1 100 MG PO TABS
100.0000 mg | ORAL_TABLET | Freq: Once | ORAL | Status: AC
  Administered 2015-06-23: 100 mg via ORAL
  Filled 2015-06-23: qty 1

## 2015-06-23 MED ORDER — PHENYTOIN SODIUM EXTENDED 100 MG PO CAPS
300.0000 mg | ORAL_CAPSULE | Freq: Once | ORAL | Status: AC
  Administered 2015-06-23: 300 mg via ORAL
  Filled 2015-06-23: qty 3

## 2015-06-23 NOTE — ED Provider Notes (Signed)
CSN: [id number]     Arrival date & time 06/23/15  1759 History   First MD Initiated Contact with Patient 06/23/15 1902     Chief Complaint  Patient presents with  . Hyperglycemia   HPI Patient presents to the emergency room with complaints of limping in his hands. He also feels like he might have a seizure. He said some nausea as well. Patient denies any abdominal pain. No fevers. No recent injuries. Patient is post to be on medications for seizures but cannot afford them. He did have enough money to buy some wine today and he drank about a pint. Denies chest pain or shortness of breath. He has not had a seizure today. He has not been taking any of his medications.  Patient was recently in the emergency room yesterday for similar complaints. he left AMA before completing his treatment. Past Medical History  Diagnosis Date  . Pancreatitis, acute     First episode earlier in 2012, hospitalized again in 02/2012 and 09/2012  . Hypertension     noncompliance  . Hyperlipidemia     noncompliance  . Lung collapse     h/o  . ETOH abuse   . Cocaine abuse   . DTs (delirium tremens)     history of  . Tobacco abuse   . Seizure disorder 07/17/2013  . Noncompliance 07/17/2013  . Traumatic subdural hematoma 06/15/2013  . 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 Open skull fracture 06/15/2013  . Polysubstance abuse     etoh, cocaine  . Seizures   . Drug-seeking behavior   . Diabetes mellitus without complication   . Nephrolithiasis    Past Surgical History  Procedure Laterality Date  . Mandible fracture surgery  2006  . Left axillary      surgery for deep laceration   Family History  Problem Relation Age of Onset  . Diabetes type II Mother   . Liver disease Neg Hx   . Colon cancer Neg Hx   . GI problems Neg Hx    History  Substance Use Topics  . Smoking status: Current Every Day Smoker -- 1.00 packs/day for 20 years    Types: Cigarettes  . Smokeless tobacco: Current User  .  Alcohol Use: Yes     Comment: occ    Review of Systems  All other systems reviewed and are negative.     Allergies  Review of patient's allergies indicates no known allergies.  Home Medications   Prior to Admission medications   Medication Sig Start Date End Date Taking? Authorizing Provider  amLODipine (NORVASC) 10 MG tablet Take 1 tablet (10 mg total) by mouth daily. 05/14/15   Jerald Kief, MD  phenytoin (DILANTIN) 100 MG ER capsule Take 1 capsule (100 mg total) by mouth 3 (three) times daily. 05/14/15   Jerald Kief, MD  potassium chloride (K-DUR) 10 MEQ tablet Take 1 tablet (10 mEq total) by mouth 2 (two) times daily. Patient not taking: Reported on 06/19/2015 04/27/15   Vanetta Mulders, MD   BP 104/77 mmHg  Pulse 82  Temp(Src) 98.4 F (36.9 C) (Oral)  Resp 18  Ht 6' (1.829 m)  Wt 157 lb (71.215 kg)  BMI 21.29 kg/m2  SpO2 97% Physical Exam  Constitutional: No distress.  HENT:  Head: Normocephalic and atraumatic.  Right Ear: External ear normal.  Left Ear: External ear normal.  Mouth/Throat: No oropharyngeal exudate.  Eyes: Conjunctivae are normal. Right eye exhibits no discharge. Left eye exhibits no  discharge. No scleral icterus.  Neck: Neck supple. No tracheal deviation present.  Cardiovascular: Normal rate, regular rhythm and intact distal pulses.   Pulmonary/Chest: Effort normal and breath sounds normal. No stridor. No respiratory distress. He has no wheezes. He has no rales.  Abdominal: Soft. Bowel sounds are normal. He exhibits no distension. There is no tenderness. There is no rebound and no guarding.  Genitourinary: Rectal exam shows no mass and no tenderness. Guaiac negative stool.  Musculoskeletal: He exhibits no edema or tenderness.  Neurological: He is alert. He has normal strength. No cranial nerve deficit (no facial droop, extraocular movements intact, no slurred speech) or sensory deficit. He exhibits normal muscle tone. He displays no seizure  activity. Coordination normal.  Normal gait  Skin: Skin is warm and dry. No rash noted.  Psychiatric: He has a normal mood and affect.  Nursing note and vitals reviewed.   ED Course  Procedures (including critical care time) Labs Review Labs Reviewed  CBC WITH DIFFERENTIAL/PLATELET - Abnormal; Notable for the following:    WBC 3.8 (*)    RBC 2.80 (*)    Hemoglobin 8.7 (*)    HCT 24.0 (*)    MCHC 36.3 (*)    RDW 17.6 (*)    Platelets 103 (*)    All other components within normal limits  BASIC METABOLIC PANEL - Abnormal; Notable for the following:    Chloride 113 (*)    Glucose, Bld 358 (*)    Calcium 7.4 (*)    All other components within normal limits  ETHANOL - Abnormal; Notable for the following:    Alcohol, Ethyl (B) 235 (*)    All other components within normal limits  CBG MONITORING, ED - Abnormal; Notable for the following:    Glucose-Capillary 322 (*)    All other components within normal limits  CBG MONITORING, ED - Abnormal; Notable for the following:    Glucose-Capillary 240 (*)    All other components within normal limits  URINALYSIS, ROUTINE W REFLEX MICROSCOPIC (NOT AT [facility])  POC OCCULT BLOOD, ED    Medications  thiamine (VITAMIN B-1) tablet 100 mg (not administered)  multivitamin with minerals tablet 1 tablet (not administered)  phenytoin (DILANTIN) ER capsule 300 mg (300 mg Oral Given 06/23/15 1928)  sodium chloride 0.9 % bolus 1,000 mL (0 mLs Intravenous Stopped 06/23/15 2049)  insulin aspart (novoLOG) injection 6 Units (6 Units Subcutaneous Given 06/23/15 1930)     MDM   Final diagnoses:  Anemia, unspecified anemia type  Hyperglycemia  Alcohol intoxication, uncomplicated    Pt has worsening anemia.  No evidence of gi bleeding.  Could be related to nutritional deficiency or occult bleeding.  Pt will need further evaluation but he has been noncompliant with treatment.   Pt states he does not have the money to buy his medications however he continues  to buy alcohol.  Hyperglycemia improved with treatment.  Pt was given a dose of his dilantin in the ED.  He states he does have prescriptions      Linwood Dibbles, MD 06/23/15 2056

## 2015-06-23 NOTE — ED Notes (Addendum)
Per EMS, patient sent to ER for hyperglycemia. CBG 322 at triage. Patient complaining of nausea since yesterday. Patient was seen here yesterday for same. Also complaining of cramping in bilateral hands today. Admits to drinking "about a pint of wine" today.

## 2015-06-23 NOTE — ED Notes (Signed)
Pt alert & oriented x4, stable gait. Patient given discharge instructions, paperwork & prescription(s). Patient  instructed to stop at the registration desk to finish any additional paperwork. Patient verbalized understanding. Pt left department w/ no further questions. 

## 2015-06-23 NOTE — Discharge Instructions (Signed)
Alcohol Intoxication Alcohol intoxication occurs when you drink enough alcohol that it affects your ability to function. It can be mild or very severe. Drinking a lot of alcohol in a short time is called binge drinking. This can be very harmful. Drinking alcohol can also be more dangerous if you are taking medicines or other drugs. Some of the effects caused by alcohol may include:  Loss of coordination.  Changes in mood and behavior.  Unclear thinking.  Trouble talking (slurred speech).  Throwing up (vomiting).  Confusion.  Slowed breathing.  Twitching and shaking (seizures).  Loss of consciousness. HOME CARE  Do not drive after drinking alcohol.  Drink enough water and fluids to keep your pee (urine) clear or pale yellow. Avoid caffeine.  Only take medicine as told by your doctor. GET HELP IF:  You throw up (vomit) many times.  You do not feel better after a few days.  You frequently have alcohol intoxication. Your doctor can help decide if you should see a substance use treatment counselor. GET HELP RIGHT AWAY IF:  You become shaky when you stop drinking.  You have twitching and shaking.  You throw up blood. It may look bright red or like coffee grounds.  You notice blood in your poop (bowel movements).  You become lightheaded or pass out (faint). MAKE SURE YOU:   Understand these instructions.  Will watch your condition.  Will get help right away if you are not doing well or get worse. Document Released: 06/03/2008 Document Revised: 08/18/2013 Document Reviewed: 05/21/2013 [facility] Patient Information 2015 [name], [place]. This information is not intended to replace advice given to you by your health care provider. Make sure you discuss any questions you have with your health care provider.  Alcohol and Nutrition Nutrition serves two purposes. It provides energy. It also maintains body structure and function. Food supplies energy. It also provides the  building blocks needed to replace worn or damaged cells. Alcoholics often eat poorly. This limits their supply of essential nutrients. This affects energy supply and structure maintenance. Alcohol also affects the body's nutrients in:  Digestion.  Storage.  Using and getting rid of waste products. IMPAIRMENT OF NUTRIENT DIGESTION AND UTILIZATION   Once ingested, food must be broken down into small components (digested). Then it is available for energy. It helps maintain body structure and function. Digestion begins in the mouth. It continues in the stomach and intestines, with help from the pancreas. The nutrients from digested food are absorbed from the intestines into the blood. Then they are carried to the liver. The liver prepares nutrients for:  Immediate use.  Storage and future use.  Alcohol inhibits the breakdown of nutrients into usable molecules.  It decreases secretion of digestive enzymes from the pancreas.  Alcohol impairs nutrient absorption by damaging the cells lining the stomach and intestines.  It also interferes with moving some nutrients into the blood.  In addition, nutritional deficiencies themselves may lead to further absorption problems.  For example, folate deficiency changes the cells that line the small intestine. This impairs how water is absorbed. It also affects absorbed nutrients. These include glucose, sodium, and additional folate.  Even if nutrients are digested and absorbed, alcohol can prevent them from being fully used. It changes their transport, storage, and excretion. Impaired utilization of nutrients by alcoholics is indicated by:  Decreased liver stores of vitamins, such as vitamin A.  Increased excretion of nutrients such as fat. ALCOHOL AND ENERGY SUPPLY   Three basic nutritional  components found in food are:  Carbohydrates.  Proteins.  Fats.  These are used as energy. Some alcoholics take in as much as 50% of their total daily  calories from alcohol. They often neglect important foods.  Even when enough food is eaten, alcohol can impair the ways the body controls blood sugar (glucose) levels. It may either increase or decrease blood sugar.  In non-diabetic alcoholics, increased blood sugar (hyperglycemia) is caused by poor insulin secretion. It is usually temporary.  Decreased blood sugar (hypoglycemia) can cause serious injury even if this condition is short-lived. Low blood sugar can happen when a fasting or malnourished person drinks alcohol. When there is no food to supply energy, stored sugar is used up. The products of alcohol inhibit forming glucose from other compounds such as amino acids. As a result, alcohol causes the brain and other body tissue to lack glucose. It is needed for energy and function.  Alcohol is an energy source. But how the body processes and uses the energy from alcohol is complex. Also, when alcohol is substituted for carbohydrates, subjects tend to lose weight. This indicates that they get less energy from alcohol than from food. ALCOHOL - MAINTAINING CELL STRUCTURE AND FUNCTION  Structure Cells are made mostly of protein. So an adequate protein diet is important for maintaining cell structure. This is especially true if cells are being damaged. Research indicates that alcohol affects protein nutrition by causing impaired:  Digestion of proteins to amino acids.  Processing of amino acids by the small intestine and liver.  Synthesis of proteins from amino acids.  Protein secretion by the liver. Function Nutrients are essential for the body to function well. They provide the tools that the body needs to work well:   Proteins.  Vitamins.  Minerals. Alcohol can disrupt body function. It may cause nutrient deficiencies. And it may interfere with the way nutrients are processed. Vitamins  Vitamins are essential to maintain growth and normal metabolism. They regulate many of the body`s  processes. Chronic heavy drinking causes deficiencies in many vitamins. This is caused by eating less. And, in some cases, vitamins may be poorly absorbed. For example, alcohol inhibits fat absorption. It impairs how the vitamins A, E, and D are normally absorbed along with dietary fats. Not enough vitamin A may cause night blindness. Not enough vitamin D may cause softening of the bones.  Some alcoholics lack vitamins A, C, D, E, K, and the B vitamins. These are all involved in wound healing and cell maintenance. In particular, because vitamin K is necessary for blood clotting, lacking that vitamin can cause delayed clotting. The result is excess bleeding. Lacking other vitamins involved in brain function may cause severe neurological damage. Minerals Deficiencies of minerals such as calcium, magnesium, iron, and zinc are common in alcoholics. The alcohol itself does not seem to affect how these minerals are absorbed. Rather, they seem to occur secondary to other alcohol-related problems, such as:  Less calcium absorbed.  Not enough magnesium.  More urinary excretion.  Vomiting.  Diarrhea.  Not enough iron due to gastrointestinal bleeding.  Not enough zinc or losses related to other nutrient deficiencies.  Mineral deficiencies can cause a variety of medical consequences. These range from calcium-related bone disease to zinc-related night blindness and skin lesions. ALCOHOL, MALNUTRITION, AND MEDICAL COMPLICATIONS  Liver Disease   Alcoholic liver damage is caused primarily by alcohol itself. But poor nutrition may increase the risk of alcohol-related liver damage. For example, nutrients normally found in  the liver are known to be affected by drinking alcohol. These include carotenoids, which are the major sources of vitamin A, and vitamin E compounds. Decreases in such nutrients may play some role in alcohol-related liver damage. Pancreatitis  Research suggests that malnutrition may  increase the risk of developing alcoholic pancreatitis. Research suggests that a diet lacking in protein may increase alcohol's damaging effect on the pancreas. Brain  Nutritional deficiencies may have severe effects on brain function. These may be permanent. Specifically, thiamine deficiencies are often seen in alcoholics. They can cause severe neurological problems. These include:  Impaired movement.  Memory loss seen in Wernicke-Korsakoff syndrome. Pregnancy  Alcohol has toxic effects on fetal development. It causes alcohol-related birth defects. They include fetal alcohol syndrome. Alcohol itself is toxic to the fetus. Also, the nutritional deficiency can affect how the fetus develops. That may compound the risk of developmental damage.  Nutritional needs during pregnancy are 10% to 30% greater than normal. Food intake can increase by as much as 140% to cover the needs of both mother and fetus. An alcoholic mother`s nutritional problems may adversely affect the nutrition of the fetus. And alcohol itself can also restrict nutrition flow to the fetus. NUTRITIONAL STATUS OF ALCOHOLICS  Techniques for assessing nutritional status include:  Taking body measurements to estimate fat reserves. They include:  Weight.  Height.  Mass.  Skin fold thickness.  Performing blood analysis to provide measurements of circulating:  Proteins.  Vitamins.  Minerals.  These techniques tend to be imprecise. For many nutrients, there is no clear "cut-off" point that would allow an accurate definition of deficiency. So assessing the nutritional status of alcoholics is limited by these techniques. Dietary status may provide information about the risk of developing nutritional problems. Dietary status is assessed by:  Taking patients' dietary histories.  Evaluating the amount and types of food they are eating.  It is difficult to determine what exact amount of alcohol begins to have damaging effects  on nutrition. In general, moderate drinkers have 2 drinks or less per day. They seem to be at little risk for nutritional problems. Various medical disorders begin to appear at greater levels.  Research indicates that the majority of even the heaviest drinkers have few obvious nutritional deficiencies. Many alcoholics who are hospitalized for medical complications of their disease do have severe malnutrition. Alcoholics tend to eat poorly. Often they eat less than the amounts of food necessary to provide enough:  Carbohydrates.  Protein.  Fat.  Vitamins A and C.  B vitamins.  Minerals like calcium and iron. Of major concern is alcohol's effect on digesting food and use of nutrients. It may shift a mildly malnourished person toward severe malnutrition. Document Released: 10/10/2005 Document Revised: 03/09/2012 Document Reviewed: 03/26/2006 [facility] Patient Information 2015 [place], [place]. This information is not intended to replace advice given to you by your health care provider. Make sure you discuss any questions you have with your health care provider. Anemia, Nonspecific Anemia is a condition in which the concentration of red blood cells or hemoglobin in the blood is below normal. Hemoglobin is a substance in red blood cells that carries oxygen to the tissues of the body. Anemia results in not enough oxygen reaching these tissues.  CAUSES  Common causes of anemia include:   Excessive bleeding. Bleeding may be internal or external. This includes excessive bleeding from periods (in women) or from the intestine.   Poor nutrition.   Chronic kidney, thyroid, and liver disease.  Bone marrow  disorders that decrease red blood cell production.  Cancer and treatments for cancer.  HIV, AIDS, and their treatments.  Spleen problems that increase red blood cell destruction.  Blood disorders.  Excess destruction of red blood cells due to infection, medicines, and autoimmune  disorders. SIGNS AND SYMPTOMS   Minor weakness.   Dizziness.   Headache.  Palpitations.   Shortness of breath, especially with exercise.   Paleness.  Cold sensitivity.  Indigestion.  Nausea.  Difficulty sleeping.  Difficulty concentrating. Symptoms may occur suddenly or they may develop slowly.  DIAGNOSIS  Additional blood tests are often needed. These help your health care provider determine the best treatment. Your health care provider will check your stool for blood and look for other causes of blood loss.  TREATMENT  Treatment varies depending on the cause of the anemia. Treatment can include:   Supplements of iron, vitamin B12, or folic acid.   Hormone medicines.   A blood transfusion. This may be needed if blood loss is severe.   Hospitalization. This may be needed if there is significant continual blood loss.   Dietary changes.  Spleen removal. HOME CARE INSTRUCTIONS Keep all follow-up appointments. It often takes many weeks to correct anemia, and having your health care provider check on your condition and your response to treatment is very important. SEEK IMMEDIATE MEDICAL CARE IF:   You develop extreme weakness, shortness of breath, or chest pain.   You become dizzy or have trouble concentrating.  You develop heavy vaginal bleeding.   You develop a rash.   You have bloody or black, tarry stools.   You faint.   You vomit up blood.   You vomit repeatedly.   You have abdominal pain.  You have a fever or persistent symptoms for more than 2-3 days.   You have a fever and your symptoms suddenly get worse.   You are dehydrated.  MAKE SURE YOU:  Understand these instructions.  Will watch your condition.  Will get help right away if you are not doing well or get worse. Document Released: 01/23/2005 Document Revised: 08/18/2013 Document Reviewed: 06/11/2013 [facility] Patient Information 2015 [place], [place]. This information  is not intended to replace advice given to you by your health care provider. Make sure you discuss any questions you have with your health care provider.

## 2015-06-24 ENCOUNTER — Observation Stay (HOSPITAL_COMMUNITY)
Admission: EM | Admit: 2015-06-24 | Discharge: 2015-06-25 | Disposition: A | Discharge status: Home / Self Care | Payer: MEDICAID | Attending: Family Medicine | Admitting: Family Medicine

## 2015-06-24 ENCOUNTER — Encounter (HOSPITAL_COMMUNITY): Payer: Self-pay | Admitting: *Deleted

## 2015-06-24 DIAGNOSIS — N200 Calculus of kidney: Secondary | ICD-10-CM | POA: Diagnosis not present

## 2015-06-24 DIAGNOSIS — F1010 Alcohol abuse, uncomplicated: Secondary | ICD-10-CM | POA: Diagnosis present

## 2015-06-24 DIAGNOSIS — R195 Other fecal abnormalities: Secondary | ICD-10-CM | POA: Insufficient documentation

## 2015-06-24 DIAGNOSIS — E119 Type 2 diabetes mellitus without complications: Secondary | ICD-10-CM

## 2015-06-24 DIAGNOSIS — R45851 Suicidal ideations: Secondary | ICD-10-CM

## 2015-06-24 DIAGNOSIS — G40909 Epilepsy, unspecified, not intractable, without status epilepticus: Secondary | ICD-10-CM | POA: Diagnosis not present

## 2015-06-24 DIAGNOSIS — Z9119 Patient's noncompliance with other medical treatment and regimen: Secondary | ICD-10-CM | POA: Diagnosis not present

## 2015-06-24 DIAGNOSIS — K859 Acute pancreatitis, unspecified: Secondary | ICD-10-CM | POA: Diagnosis not present

## 2015-06-24 DIAGNOSIS — K922 Gastrointestinal hemorrhage, unspecified: Secondary | ICD-10-CM | POA: Diagnosis not present

## 2015-06-24 DIAGNOSIS — Z87828 Personal history of other (healed) physical injury and trauma: Secondary | ICD-10-CM | POA: Insufficient documentation

## 2015-06-24 DIAGNOSIS — F10120 Alcohol abuse with intoxication, uncomplicated: Secondary | ICD-10-CM | POA: Diagnosis present

## 2015-06-24 DIAGNOSIS — J9819 Other pulmonary collapse: Secondary | ICD-10-CM | POA: Insufficient documentation

## 2015-06-24 DIAGNOSIS — Z79899 Other long term (current) drug therapy: Secondary | ICD-10-CM | POA: Insufficient documentation

## 2015-06-24 DIAGNOSIS — K7010 Alcoholic hepatitis without ascites: Secondary | ICD-10-CM

## 2015-06-24 DIAGNOSIS — K7210 Chronic hepatic failure without coma: Secondary | ICD-10-CM | POA: Diagnosis present

## 2015-06-24 DIAGNOSIS — E785 Hyperlipidemia, unspecified: Secondary | ICD-10-CM | POA: Diagnosis not present

## 2015-06-24 DIAGNOSIS — D649 Anemia, unspecified: Secondary | ICD-10-CM | POA: Insufficient documentation

## 2015-06-24 DIAGNOSIS — Z91199 Patient's noncompliance with other medical treatment and regimen due to unspecified reason: Secondary | ICD-10-CM

## 2015-06-24 DIAGNOSIS — D61818 Other pancytopenia: Secondary | ICD-10-CM | POA: Diagnosis present

## 2015-06-24 DIAGNOSIS — E1165 Type 2 diabetes mellitus with hyperglycemia: Secondary | ICD-10-CM | POA: Diagnosis not present

## 2015-06-24 DIAGNOSIS — D696 Thrombocytopenia, unspecified: Secondary | ICD-10-CM | POA: Insufficient documentation

## 2015-06-24 DIAGNOSIS — I10 Essential (primary) hypertension: Secondary | ICD-10-CM | POA: Diagnosis not present

## 2015-06-24 DIAGNOSIS — Z720 Tobacco use: Secondary | ICD-10-CM | POA: Diagnosis not present

## 2015-06-24 DIAGNOSIS — F10920 Alcohol use, unspecified with intoxication, uncomplicated: Secondary | ICD-10-CM

## 2015-06-24 LAB — RAPID URINE DRUG SCREEN, HOSP PERFORMED
AMPHETAMINES: NOT DETECTED
BARBITURATES: NOT DETECTED
BENZODIAZEPINES: NOT DETECTED
Cocaine: NOT DETECTED
Opiates: NOT DETECTED
Tetrahydrocannabinol: NOT DETECTED

## 2015-06-24 LAB — SAMPLE TO BLOOD BANK

## 2015-06-24 LAB — CBC WITH DIFFERENTIAL/PLATELET
BASOS ABS: 0 10*3/uL (ref 0.0–0.1)
Basophils Relative: 0 % (ref 0–1)
Eosinophils Absolute: 0 10*3/uL (ref 0.0–0.7)
Eosinophils Relative: 1 % (ref 0–5)
HCT: 23.4 % — ABNORMAL LOW (ref 39.0–52.0)
HEMOGLOBIN: 8.5 g/dL — AB (ref 13.0–17.0)
LYMPHS ABS: 1.6 10*3/uL (ref 0.7–4.0)
Lymphocytes Relative: 40 % (ref 12–46)
MCH: 30.9 pg (ref 26.0–34.0)
MCHC: 36.3 g/dL — AB (ref 30.0–36.0)
MCV: 85.1 fL (ref 78.0–100.0)
Monocytes Absolute: 0.2 10*3/uL (ref 0.1–1.0)
Monocytes Relative: 5 % (ref 3–12)
Neutro Abs: 2.1 10*3/uL (ref 1.7–7.7)
Neutrophils Relative %: 54 % (ref 43–77)
PLATELETS: 87 10*3/uL — AB (ref 150–400)
RBC: 2.75 MIL/uL — ABNORMAL LOW (ref 4.22–5.81)
RDW: 18 % — AB (ref 11.5–15.5)
WBC: 3.9 10*3/uL — ABNORMAL LOW (ref 4.0–10.5)

## 2015-06-24 LAB — COMPREHENSIVE METABOLIC PANEL
ALK PHOS: 216 U/L — AB (ref 38–126)
ALT: 38 U/L (ref 17–63)
AST: 54 U/L — AB (ref 15–41)
Albumin: 1.8 g/dL — ABNORMAL LOW (ref 3.5–5.0)
Anion gap: 6 (ref 5–15)
BUN: 8 mg/dL (ref 6–20)
CO2: 17 mmol/L — AB (ref 22–32)
CREATININE: 0.69 mg/dL (ref 0.61–1.24)
Calcium: 7 mg/dL — ABNORMAL LOW (ref 8.9–10.3)
Chloride: 115 mmol/L — ABNORMAL HIGH (ref 101–111)
GFR calc Af Amer: >60 mL/min (ref >60)
GFR calc non Af Amer: >60 mL/min (ref >60)
GLUCOSE: 252 mg/dL — AB (ref 65–99)
POTASSIUM: 3.4 mmol/L — AB (ref 3.5–5.1)
SODIUM: 138 mmol/L (ref 135–145)
Total Bilirubin: 4.6 mg/dL — ABNORMAL HIGH (ref 0.3–1.2)
Total Protein: 5.1 g/dL — ABNORMAL LOW (ref 6.5–8.1)

## 2015-06-24 LAB — URINALYSIS, ROUTINE W REFLEX MICROSCOPIC
GLUCOSE, UA: NEGATIVE mg/dL
Hgb urine dipstick: NEGATIVE
Ketones, ur: NEGATIVE mg/dL
Leukocytes, UA: NEGATIVE
NITRITE: NEGATIVE
Protein, ur: NEGATIVE mg/dL
SPECIFIC GRAVITY, URINE: 1.015 (ref 1.005–1.030)
Urobilinogen, UA: 0.2 mg/dL (ref 0.0–1.0)
pH: 6 (ref 5.0–8.0)

## 2015-06-24 LAB — POC OCCULT BLOOD, ED: Fecal Occult Bld: POSITIVE — AB

## 2015-06-24 LAB — PROTIME-INR
INR: 2.18 — ABNORMAL HIGH (ref 0.00–1.49)
Prothrombin Time: 24.1 seconds — ABNORMAL HIGH (ref 11.6–15.2)

## 2015-06-24 LAB — ETHANOL: ALCOHOL ETHYL (B): 362 mg/dL — AB (ref <5)

## 2015-06-24 MED ORDER — SODIUM CHLORIDE 0.9 % IV BOLUS (SEPSIS): 500.0000 mL | Freq: Once | INTRAVENOUS | Status: DC

## 2015-06-24 MED ORDER — SODIUM CHLORIDE 0.9 % IV SOLN
INTRAVENOUS | Status: DC
  Administered 2015-06-25: 01:00:00 via INTRAVENOUS

## 2015-06-24 MED ORDER — SODIUM CHLORIDE 0.9 % IV SOLN
80.0000 mg | Freq: Once | INTRAVENOUS | Status: AC
  Administered 2015-06-25: 80 mg via INTRAVENOUS
  Filled 2015-06-24: qty 80

## 2015-06-24 MED ORDER — SODIUM CHLORIDE 0.9 % IV SOLN
8.0000 mg/h | INTRAVENOUS | Status: DC
  Administered 2015-06-25: 8 mg/h via INTRAVENOUS
  Filled 2015-06-24 (×5): qty 80

## 2015-06-24 NOTE — ED Provider Notes (Signed)
CSN: [id number]     Arrival date & time 06/24/15  1926 History   First MD Initiated Contact with Patient 06/24/15 1952     Chief Complaint  Patient presents with  . Hyperglycemia  . Alcohol Intoxication      Patient is a 49 y.o. male presenting with hyperglycemia and intoxication. The history is provided by the patient and the EMS personnel. The history is limited by the condition of the patient (intoxicated).  Hyperglycemia Alcohol Intoxication   Pt was seen at 2020. Per EMS and pt report: c/o gradual onset and persistence of multiple intermittent episodes of "dark stools" for the past 1 week. Pt was evaluated in the ED yesterday for same and states he is back for evaluation today because he is "too weak." Pt has hx of etoh abuse and has drank "about a 5th today." EMS states pt's CBG was "300" and pt was incont of stool. Denies N/V, no abd pain, no CP/SOB. Pt also states he has been having SI and "wants to hurt himself."  Denies SA, no HI.    Past Medical History  Diagnosis Date  . Pancreatitis, acute     First episode earlier in 2012, hospitalized again in 02/2012 and 09/2012  . Hypertension     noncompliance  . Hyperlipidemia     noncompliance  . Lung collapse     h/o  . ETOH abuse   . Cocaine abuse   . DTs (delirium tremens)     history of  . Tobacco abuse   . Seizure disorder 07/17/2013  . Noncompliance 07/17/2013  . Traumatic subdural hematoma 06/15/2013  . 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 Open skull fracture 06/15/2013  . Polysubstance abuse     etoh, cocaine  . Seizures   . Drug-seeking behavior   . Diabetes mellitus without complication   . Nephrolithiasis    Past Surgical History  Procedure Laterality Date  . Mandible fracture surgery  2006  . Left axillary      surgery for deep laceration   Family History  Problem Relation Age of Onset  . Diabetes type II Mother   . Liver disease Neg Hx   . Colon cancer Neg Hx   . GI problems Neg Hx     History  Substance Use Topics  . Smoking status: Current Every Day Smoker -- 1.00 packs/day for 20 years    Types: Cigarettes  . Smokeless tobacco: Current User  . Alcohol Use: Yes     Comment: occ    Review of Systems  Unable to perform ROS: Other       Allergies  Review of patient's allergies indicates no known allergies.  Home Medications   Prior to Admission medications   Medication Sig Start Date End Date Taking? Authorizing Provider  amLODipine (NORVASC) 10 MG tablet Take 1 tablet (10 mg total) by mouth daily. 05/14/15   Jerald Kief, MD  phenytoin (DILANTIN) 100 MG ER capsule Take 1 capsule (100 mg total) by mouth 3 (three) times daily. 05/14/15   Jerald Kief, MD  potassium chloride (K-DUR) 10 MEQ tablet Take 1 tablet (10 mEq total) by mouth 2 (two) times daily. Patient not taking: Reported on 06/19/2015 04/27/15   Vanetta Mulders, MD   BP 128/91 mmHg  Pulse 81  Temp(Src) 97.6 F (36.4 C) (Oral)  Resp 15  SpO2 100%   Physical Exam 2025: Physical examination:  Nursing notes reviewed; Vital signs and O2 SAT reviewed;  Constitutional: Well developed,  Well nourished, In no acute distress; Head:  Normocephalic, atraumatic; Eyes: EOMI, PERRL, No scleral icterus; ENMT: Mouth and pharynx normal, Mucous membranes dry; Neck: Supple, Full range of motion, No lymphadenopathy; Cardiovascular: Regular rate and rhythm, No gallop; Respiratory: Breath sounds clear & equal bilaterally, No wheezes.  Speaking full sentences with ease, Normal respiratory effort/excursion; Chest: Nontender, Movement normal; Abdomen: Soft, Nontender, Nondistended, Normal bowel sounds; Genitourinary: No CVA tenderness; Extremities: Pulses normal, No tenderness, No edema, No calf edema or asymmetry.; Neuro: AA&Ox3, Major CN grossly intact.  Speech slurred. Moves all extremities on stretcher spontaneously and to command without gross focal motor deficits.; Skin: Color normal, Warm, Dry.; Psych:  Endorses  SI.    ED Course  Procedures     EKG Interpretation None      MDM  MDM Reviewed: previous chart, nursing note and vitals Reviewed previous: labs Interpretation: labs      Results for orders placed or performed during the hospital encounter of 06/24/15  Comprehensive metabolic panel  Result Value Ref Range   Sodium 138 135 - 145 mmol/L   Potassium 3.4 (L) 3.5 - 5.1 mmol/L   Chloride 115 (H) 101 - 111 mmol/L   CO2 17 (L) 22 - 32 mmol/L   Glucose, Bld 252 (H) 65 - 99 mg/dL   BUN 8 6 - 20 mg/dL   Creatinine, Ser 0.76 0.61 - 1.24 mg/dL   Calcium 7.0 (L) 8.9 - 10.3 mg/dL   Total Protein 5.1 (L) 6.5 - 8.1 g/dL   Albumin 1.8 (L) 3.5 - 5.0 g/dL   AST 54 (H) 15 - 41 U/L   ALT 38 17 - 63 U/L   Alkaline Phosphatase 216 (H) 38 - 126 U/L   Total Bilirubin 4.6 (H) 0.3 - 1.2 mg/dL   GFR calc non Af Amer >60 >60 mL/min   GFR calc Af Amer >60 >60 mL/min   Anion gap 6 5 - 15  Ethanol  Result Value Ref Range   Alcohol, Ethyl (B) 362 (HH) <5 mg/dL  CBC with Differential  Result Value Ref Range   WBC 3.9 (L) 4.0 - 10.5 K/uL   RBC 2.75 (L) 4.22 - 5.81 MIL/uL   Hemoglobin 8.5 (L) 13.0 - 17.0 g/dL   HCT 80.8 (L) 81.1 - 03.1 %   MCV 85.1 78.0 - 100.0 fL   MCH 30.9 26.0 - 34.0 pg   MCHC 36.3 (H) 30.0 - 36.0 g/dL   RDW 59.4 (H) 58.5 - 92.9 %   Platelets 87 (L) 150 - 400 K/uL   Neutrophils Relative % 54 43 - 77 %   Neutro Abs 2.1 1.7 - 7.7 K/uL   Lymphocytes Relative 40 12 - 46 %   Lymphs Abs 1.6 0.7 - 4.0 K/uL   Monocytes Relative 5 3 - 12 %   Monocytes Absolute 0.2 0.1 - 1.0 K/uL   Eosinophils Relative 1 0 - 5 %   Eosinophils Absolute 0.0 0.0 - 0.7 K/uL   Basophils Relative 0 0 - 1 %   Basophils Absolute 0.0 0.0 - 0.1 K/uL  Urine rapid drug screen (hosp performed)  Result Value Ref Range   Opiates NONE DETECTED NONE DETECTED   Cocaine NONE DETECTED NONE DETECTED   Benzodiazepines NONE DETECTED NONE DETECTED   Amphetamines NONE DETECTED NONE DETECTED   Tetrahydrocannabinol  NONE DETECTED NONE DETECTED   Barbiturates NONE DETECTED NONE DETECTED  Urinalysis, Routine w reflex microscopic (not at [facility])  Result Value Ref Range   Color, Urine YELLOW  YELLOW   APPearance CLEAR CLEAR   Specific Gravity, Urine 1.015 1.005 - 1.030   pH 6.0 5.0 - 8.0   Glucose, UA NEGATIVE NEGATIVE mg/dL   Hgb urine dipstick NEGATIVE NEGATIVE   Bilirubin Urine MODERATE (A) NEGATIVE   Ketones, ur NEGATIVE NEGATIVE mg/dL   Protein, ur NEGATIVE NEGATIVE mg/dL   Urobilinogen, UA 0.2 0.0 - 1.0 mg/dL   Nitrite NEGATIVE NEGATIVE   Leukocytes, UA NEGATIVE NEGATIVE  Protime-INR  Result Value Ref Range   Prothrombin Time 24.1 (H) 11.6 - 15.2 seconds   INR 2.18 (H) 0.00 - 1.49  POC occult blood, ED  Result Value Ref Range   Fecal Occult Bld POSITIVE (A) NEGATIVE  Sample to Blood Bank  Result Value Ref Range   Blood Bank Specimen BBHLD    Sample Expiration 06/26/2015   Type and screen  Result Value Ref Range   ABO/RH(D) B POS    Antibody Screen PENDING    Sample Expiration 06/27/2015     Results for VILIAMI, BRACCO (MRN [id number]) as of 06/24/2015 22:54  Ref. Range 06/13/2015 17:21 06/19/2015 14:27 06/22/2015 14:59 06/23/2015 18:19 06/24/2015 20:37  Hemoglobin Latest Ref Range: 13.0-17.0 g/dL 65.7 (L) 84.6 (L) 9.5 (L) 8.7 (L) 8.5 (L)  HCT Latest Ref Range: 39.0-52.0 % 31.2 (L) 30.5 (L) 26.3 (L) 24.0 (L) 23.4 (L)  Platelets Latest Ref Range: 150-400 K/uL 141 (L) 143 (L) 98 (L) 103 (L) 87 (L)   Results for LATWAN, LUCHSINGER (MRN [id number]) as of 06/24/2015 23:14  Ref. Range 10/01/2013 20:23 02/17/2015 18:18 02/18/2015 08:54 02/20/2015 05:45 06/24/2015 20:37  Prothrombin Time Latest Ref Range: 11.6-15.2 seconds 13.7 18.2 (H) 16.1 (H) 13.9 24.1 (H)  INR Latest Ref Range: 0.00-1.49  1.07 1.49 1.28 1.06 2.18 (H)     2250:  H/H and platelets trending downward over the past 5 days. T&S ordered. PT/INR newly elevated compared to 4 months ago. IV protonix bolus and gtt started. Stool is heme  positive, no bloody stool while in the ED. No N/V while in the ED. Abd remains benign. VSS. Pt will need medical admit for GIB before psych eval for SI; will IVC. T/C to [facility] Dr. Conley Rolls, case discussed, including:  HPI, pertinent PM/SHx, VS/PE, dx testing, ED course and treatment:  Agreeable to admit, will come to the ED for evaluation.    Samuel Jester, DO 06/27/15 2152

## 2015-06-24 NOTE — ED Notes (Addendum)
Patient cleaned of stool. Hemoccult was faint positive. Patient placed in burgundy scrubs.

## 2015-06-24 NOTE — ED Notes (Addendum)
Pt arrived by EMS. Pt admits to drinking about a 5th today. Blood sugar was 350 per EMS. Pt was incontinent of stool & says he is unable to walk. Pt states has been thinking about harming himself.

## 2015-06-24 NOTE — H&P (Signed)
[facility] History and Physical  Paul [name] SEG:[id number] DOB: 1966-11-08    PCP:   None.   Chief Complaint: Dark stool.   HPI: Paul [name] is an 49 y.o. male with significant alcohol abuse (4 bottles of 750cc red wine per day), hx of seizure and medication non compliance, whom I admitted last time for seizure,  brought to the ER as he was having dark stool. He was found to have a Hb of 8.5 g per dL, from a Hb of 73.7 about a week ago.  He denied hematemesis, epigastric pain, nausea or vomting.  His Cr is OK, but his BS was slightly elevated to 249.  Hospitalist was asked to admit him for GI bleed, alcohol abuse, and hyperglycemia.   Rewiew of Systems:  Constitutional: Negative for malaise, fever and chills. No significant weight loss or weight gain Eyes: Negative for eye pain, redness and discharge, diplopia, visual changes, or flashes of light. ENMT: Negative for ear pain, hoarseness, nasal congestion, sinus pressure and sore throat. No headaches; tinnitus, drooling, or problem swallowing. Cardiovascular: Negative for chest pain, palpitations, diaphoresis, dyspnea and peripheral edema. ; No orthopnea, PND Respiratory: Negative for cough, hemoptysis, wheezing and stridor. No pleuritic chestpain. Gastrointestinal: Negative for  diarrhea, constipation,blood in stool, hematemesis, jaundice and rectal bleeding.    Genitourinary: Negative for frequency, dysuria, incontinence,flank pain and hematuria; Musculoskeletal: Negative for back pain and neck pain. Negative for swelling and trauma.;  Skin: . Negative for pruritus, rash, abrasions, bruising and skin lesion.; ulcerations Neuro: Negative for headache, and neck stiffness. Negative for weakness, altered level of consciousness , altered mental status, extremity weakness, burning feet, involuntary movement, seizure and syncope.  Psych: negative for anxiety, depression, insomnia, tearfulness, panic attacks, hallucinations,  paranoia, suicidal or homicidal ideation    Past Medical History  Diagnosis Date  . Pancreatitis, acute     First episode earlier in 2012, hospitalized again in 02/2012 and 09/2012  . Hypertension     noncompliance  . Hyperlipidemia     noncompliance  . Lung collapse     h/o  . ETOH abuse   . Cocaine abuse   . DTs (delirium tremens)     history of  . Tobacco abuse   . Seizure disorder 07/17/2013  . Noncompliance 07/17/2013  . Traumatic subdural hematoma 06/15/2013  . 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 Open skull fracture 06/15/2013  . Polysubstance abuse     etoh, cocaine  . Seizures   . Drug-seeking behavior   . Diabetes mellitus without complication   . Nephrolithiasis     Past Surgical History  Procedure Laterality Date  . Mandible fracture surgery  2006  . Left axillary      surgery for deep laceration    Medications:  HOME MEDS: Prior to Admission medications   Medication Sig Start Date End Date Taking? Authorizing Provider  amLODipine (NORVASC) 10 MG tablet Take 1 tablet (10 mg total) by mouth daily. 05/14/15   Jerald Kief, MD  phenytoin (DILANTIN) 100 MG ER capsule Take 1 capsule (100 mg total) by mouth 3 (three) times daily. 05/14/15   Jerald Kief, MD  potassium chloride (K-DUR) 10 MEQ tablet Take 1 tablet (10 mEq total) by mouth 2 (two) times daily. Patient not taking: Reported on 06/19/2015 04/27/15   Vanetta Mulders, MD     Allergies:  No Known Allergies  Social History:   reports that he has been smoking Cigarettes.  He has a 20 pack-year  smoking history. He uses smokeless tobacco. He reports that he drinks alcohol. He reports that he uses illicit drugs (Cocaine) about 3 times per week.  Family History: Family History  Problem Relation Age of Onset  . Diabetes type II Mother   . Liver disease Neg Hx   . Colon cancer Neg Hx   . GI problems Neg Hx      Physical Exam: Filed Vitals:   06/24/15 2210  BP: 128/91  Pulse: 81  Temp:  97.6 F (36.4 C)  TempSrc: Oral  Resp: 15  SpO2: 100%   Blood pressure 128/91, pulse 81, temperature 97.6 F (36.4 C), temperature source Oral, resp. rate 15, SpO2 100 %.  GEN:  Pleasant patient lying in the stretcher in no acute distress;  PSYCH:  alert and oriented x4; does not appear anxious or depressed; affect is appropriate. HEENT: Mucous membranes pink and anicteric; PERRLA; EOM intact; no cervical lymphadenopathy nor thyromegaly or carotid bruit; no JVD; There were no stridor. Neck is very supple. Breasts:: Not examined CHEST WALL: No tenderness CHEST: Normal respiration, clear to auscultation bilaterally.  HEART: Regular rate and rhythm.  There are no murmur, rub, or gallops.   BACK: No kyphosis or scoliosis; no CVA tenderness ABDOMEN: soft and non-tender; no masses, no organomegaly, normal abdominal bowel sounds; no pannus; no intertriginous candida. There is no rebound and no distention. Rectal Exam: Not done EXTREMITIES: No bone or joint deformity; age-appropriate arthropathy of the hands and knees; no edema; no ulcerations.  There is no calf tenderness. Genitalia: not examined PULSES: 2+ and symmetric SKIN: Normal hydration no rash or ulceration CNS: Cranial nerves 2-12 grossly intact no focal lateralizing neurologic deficit.  Speech is fluent; uvula elevated with phonation, facial symmetry and tongue midline. DTR are normal bilaterally, cerebella exam is intact, barbinski is negative and strengths are equaled bilaterally.  No sensory loss.   Labs on Admission:  Basic Metabolic Panel:  Recent Labs Lab 06/19/15 1427 06/22/15 1459 06/23/15 1819 06/24/15 2037  NA 142 133* 141 138  K 3.5 3.0* 3.9 3.4*  CL 110 102 113* 115*  CO2 23 20* 22 17*  GLUCOSE 337* 331* 358* 252*  BUN 13 10 10 8   CREATININE 0.95 1.00 1.06 0.69  CALCIUM 7.5* 7.8* 7.4* 7.0*   Liver Function Tests:  Recent Labs Lab 06/22/15 1459 06/24/15 2037  AST 49* 54*  ALT 46 38  ALKPHOS 255* 216*   BILITOT 5.6* 4.6*  PROT 5.5* 5.1*  ALBUMIN 1.9* 1.8*    Recent Labs Lab 06/22/15 1459  LIPASE 15*   No results for input(s): AMMONIA in the last 168 hours. CBC:  Recent Labs Lab 06/19/15 1427 06/22/15 1459 06/23/15 1819 06/24/15 2037  WBC 3.5* 3.6* 3.8* 3.9*  NEUTROABS 1.9 2.0 2.1 2.1  HGB 10.4* 9.5* 8.7* 8.5*  HCT 30.5* 26.3* 24.0* 23.4*  MCV 89.2 86.2 85.7 85.1  PLT 143* 98* 103* 87*   Cardiac Enzymes: No results for input(s): CKTOTAL, CKMB, CKMBINDEX, TROPONINI in the last 168 hours.  CBG:  Recent Labs Lab 06/19/15 1400 06/23/15 1803 06/23/15 2032  GLUCAP 349* 322* 240*    Assessment/Plan Present on Admission:  . GI bleed . Alcohol abuse . Chronic liver failure . Tobacco abuse  PLAN:  I suspect he has a slow upper GI bleed.  Will give IV PPI drip, and follow serial H and H's.  Will consult GI in the am.  He maintain stable hemodynamics.   For his hyperglycemia, will give insulin  PPI.  For his hx of seizure, will continue with Dilantin and check level.     Other plans as per orders.  Code Status: FULL Unk Lightning, MD. [facility] Pager [phone] 7pm to 7am.  06/24/2015, 11:17 PM

## 2015-06-25 DIAGNOSIS — K922 Gastrointestinal hemorrhage, unspecified: Secondary | ICD-10-CM

## 2015-06-25 DIAGNOSIS — K7210 Chronic hepatic failure without coma: Secondary | ICD-10-CM

## 2015-06-25 DIAGNOSIS — F10120 Alcohol abuse with intoxication, uncomplicated: Secondary | ICD-10-CM

## 2015-06-25 DIAGNOSIS — G40909 Epilepsy, unspecified, not intractable, without status epilepticus: Secondary | ICD-10-CM

## 2015-06-25 DIAGNOSIS — R195 Other fecal abnormalities: Secondary | ICD-10-CM | POA: Insufficient documentation

## 2015-06-25 DIAGNOSIS — F1010 Alcohol abuse, uncomplicated: Secondary | ICD-10-CM

## 2015-06-25 DIAGNOSIS — R45851 Suicidal ideations: Secondary | ICD-10-CM

## 2015-06-25 DIAGNOSIS — E119 Type 2 diabetes mellitus without complications: Secondary | ICD-10-CM

## 2015-06-25 DIAGNOSIS — K7010 Alcoholic hepatitis without ascites: Secondary | ICD-10-CM

## 2015-06-25 DIAGNOSIS — Z9119 Patient's noncompliance with other medical treatment and regimen: Secondary | ICD-10-CM

## 2015-06-25 DIAGNOSIS — D61818 Other pancytopenia: Secondary | ICD-10-CM

## 2015-06-25 LAB — GLUCOSE, CAPILLARY
GLUCOSE-CAPILLARY: 134 mg/dL — AB (ref 65–99)
Glucose-Capillary: 152 mg/dL — ABNORMAL HIGH (ref 65–99)
Glucose-Capillary: 116 mg/dL — ABNORMAL HIGH (ref 65–99)
Glucose-Capillary: 178 mg/dL — ABNORMAL HIGH (ref 65–99)

## 2015-06-25 LAB — CBC
HCT: 24.3 % — ABNORMAL LOW (ref 39.0–52.0)
HCT: 25.1 % — ABNORMAL LOW (ref 39.0–52.0)
Hemoglobin: 8.9 g/dL — ABNORMAL LOW (ref 13.0–17.0)
Hemoglobin: 9.1 g/dL — ABNORMAL LOW (ref 13.0–17.0)
MCH: 30.9 pg (ref 26.0–34.0)
MCH: 30.5 pg (ref 26.0–34.0)
MCHC: 36.6 g/dL — AB (ref 30.0–36.0)
MCHC: 36.3 g/dL — ABNORMAL HIGH (ref 30.0–36.0)
MCV: 84.4 fL (ref 78.0–100.0)
MCV: 84.2 fL (ref 78.0–100.0)
PLATELETS: 78 10*3/uL — AB (ref 150–400)
PLATELETS: 73 10*3/uL — AB (ref 150–400)
RBC: 2.88 MIL/uL — AB (ref 4.22–5.81)
RBC: 2.98 MIL/uL — ABNORMAL LOW (ref 4.22–5.81)
RDW: 17.9 % — ABNORMAL HIGH (ref 11.5–15.5)
RDW: 18.1 % — AB (ref 11.5–15.5)
WBC: 4 10*3/uL (ref 4.0–10.5)
WBC: 3.6 10*3/uL — ABNORMAL LOW (ref 4.0–10.5)

## 2015-06-25 LAB — TYPE AND SCREEN
ABO/RH(D): B POS
Antibody Screen: NEGATIVE

## 2015-06-25 LAB — LIPASE, BLOOD: LIPASE: 13 U/L — AB (ref 22–51)

## 2015-06-25 LAB — PHENYTOIN LEVEL, TOTAL: Phenytoin Lvl: <2.5 ug/mL — ABNORMAL LOW (ref 10.0–20.0)

## 2015-06-25 MED ORDER — VITAMIN K1 10 MG/ML IJ SOLN
10.0000 mg | Freq: Once | INTRAMUSCULAR | Status: AC
  Administered 2015-06-25: 10 mg via SUBCUTANEOUS
  Filled 2015-06-25: qty 1

## 2015-06-25 MED ORDER — THIAMINE HCL 100 MG/ML IJ SOLN: 100.0000 mg | Freq: Every day | INTRAMUSCULAR | Status: DC

## 2015-06-25 MED ORDER — VITAMIN B-1 100 MG PO TABS
100.0000 mg | ORAL_TABLET | Freq: Every day | ORAL | Status: DC
  Administered 2015-06-25: 100 mg via ORAL
  Filled 2015-06-25: qty 1

## 2015-06-25 MED ORDER — LORAZEPAM 1 MG PO TABS
1.0000 mg | ORAL_TABLET | Freq: Four times a day (QID) | ORAL | Status: DC | PRN
  Administered 2015-06-25 (×2): 1 mg via ORAL
  Filled 2015-06-25 (×2): qty 1

## 2015-06-25 MED ORDER — PHENYTOIN SODIUM EXTENDED 100 MG PO CAPS
300.0000 mg | ORAL_CAPSULE | Freq: Three times a day (TID) | ORAL | Status: DC
  Administered 2015-06-25 (×2): 300 mg via ORAL
  Filled 2015-06-25 (×2): qty 3

## 2015-06-25 MED ORDER — FOLIC ACID 1 MG PO TABS
1.0000 mg | ORAL_TABLET | Freq: Every day | ORAL | Status: DC
  Administered 2015-06-25: 1 mg via ORAL
  Filled 2015-06-25: qty 1

## 2015-06-25 MED ORDER — PANTOPRAZOLE SODIUM 40 MG IV SOLR
INTRAVENOUS | Status: AC
  Filled 2015-06-25: qty 160

## 2015-06-25 MED ORDER — PHENYTOIN SODIUM EXTENDED 100 MG PO CAPS: 300.0000 mg | ORAL_CAPSULE | Freq: Every day | ORAL | Status: DC

## 2015-06-25 MED ORDER — INSULIN ASPART 100 UNIT/ML ~~LOC~~ SOLN
0.0000 [IU] | Freq: Three times a day (TID) | SUBCUTANEOUS | Status: DC
  Administered 2015-06-25: 2 [IU] via SUBCUTANEOUS

## 2015-06-25 MED ORDER — LORAZEPAM 2 MG/ML IJ SOLN: 1.0000 mg | Freq: Four times a day (QID) | INTRAMUSCULAR | Status: DC | PRN

## 2015-06-25 MED ORDER — INSULIN ASPART 100 UNIT/ML ~~LOC~~ SOLN: 0.0000 [IU] | Freq: Every day | SUBCUTANEOUS | Status: DC

## 2015-06-25 MED ORDER — PHENYTOIN SODIUM EXTENDED 100 MG PO CAPS: 100.0000 mg | ORAL_CAPSULE | Freq: Three times a day (TID) | ORAL | Status: DC

## 2015-06-25 MED ORDER — SODIUM CHLORIDE 0.9 % IJ SOLN
3.0000 mL | Freq: Two times a day (BID) | INTRAMUSCULAR | Status: DC
  Administered 2015-06-25: 3 mL via INTRAVENOUS

## 2015-06-25 MED ORDER — ADULT MULTIVITAMIN W/MINERALS CH
1.0000 | ORAL_TABLET | Freq: Every day | ORAL | Status: DC
  Administered 2015-06-25: 1 via ORAL
  Filled 2015-06-25: qty 1

## 2015-06-25 MED ORDER — PANTOPRAZOLE SODIUM 40 MG IV SOLR
40.0000 mg | Freq: Two times a day (BID) | INTRAVENOUS | Status: DC
  Administered 2015-06-25: 40 mg via INTRAVENOUS
  Filled 2015-06-25: qty 40
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
## 2015-06-25 NOTE — Progress Notes (Signed)
MEDICATION RELATED CONSULT NOTE - INITIAL   Pharmacy Consult for Phenytoin Indication: seizure disorder  No Known Allergies  Patient Measurements: Height: 6' (182.9 cm) Weight: 139 lb 9.6 oz (63.322 kg) IBW/kg (Calculated) : 77.6  Vital Signs: Temp: 97.8 F (36.6 C) (06/26 0740) Temp Source: Oral (06/26 0740) BP: 119/82 mmHg (06/26 0740) Pulse Rate: 86 (06/26 0740) Intake/Output from previous day: 06/25 0701 - 06/26 0700 In: 712.1 [I.V.:612.1; IV Piggyback:100] Out: -  Intake/Output from this shift:    Labs:  Recent Labs  06/22/15 1459 06/23/15 1819 06/24/15 2037 06/25/15 0148 06/25/15 0631  WBC 3.6* 3.8* 3.9* 4.0 3.6*  HGB 9.5* 8.7* 8.5* 8.9* 9.1*  HCT 26.3* 24.0* 23.4* 24.3* 25.1*  PLT 98* 103* 87* 78* 73*  CREATININE 1.00 1.06 0.69  --   --   ALBUMIN 1.9*  --  1.8*  --   --   PROT 5.5*  --  5.1*  --   --   AST 49*  --  54*  --   --   ALT 46  --  38  --   --   ALKPHOS 255*  --  216*  --   --   BILITOT 5.6*  --  4.6*  --   --    Estimated Creatinine Clearance: 100 mL/min (by C-G formula based on Cr of 0.69).   Microbiology: No results found for this or any previous visit (from the past 720 hour(s)).  Medical History: Past Medical History  Diagnosis Date  . Pancreatitis, acute     First episode earlier in 2012, hospitalized again in 02/2012 and 09/2012  . Hypertension     noncompliance  . Hyperlipidemia     noncompliance  . Lung collapse     h/o  . ETOH abuse   . Cocaine abuse   . DTs (delirium tremens)     history of  . Tobacco abuse   . Seizure disorder 07/17/2013  . Noncompliance 07/17/2013  . Traumatic subdural hematoma 06/15/2013  . 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 Open skull fracture 06/15/2013  . Polysubstance abuse     etoh, cocaine  . Seizures   . Drug-seeking behavior   . Diabetes mellitus without complication   . Nephrolithiasis     Medications:  Prescriptions prior to admission  Medication Sig Dispense Refill  Last Dose  . amLODipine (NORVASC) 10 MG tablet Take 1 tablet (10 mg total) by mouth daily. 30 tablet 0 Unknown  . phenytoin (DILANTIN) 100 MG ER capsule Take 1 capsule (100 mg total) by mouth 3 (three) times daily. 90 capsule 0 Unknown  . potassium chloride (K-DUR) 10 MEQ tablet Take 1 tablet (10 mEq total) by mouth 2 (two) times daily. (Patient not taking: Reported on 06/19/2015) 14 tablet 0 Not Taking at Unknown time    Assessment: 49 yo M with hx seizures on Dilantin.  He is not compliant with medication- cannot recall last dose PTA.  As expected, phenytoin level is undetectable.   Albumin is low.  Normal renal function.  No seizure activity noted this admission.   Goal of Therapy:  Phenytoin level 10-20 mcg/ml  Plan:  Dilantin 300mg  po TID today then 300mg  po QHS F/U renal fxn, albumin, dilantin level at steady state *Consider patient compliance and discharge home on Phenytoin 300mg  po qhs vs 100mg  TID   Elson Clan 06/25/2015,9:17 AM

## 2015-06-25 NOTE — Progress Notes (Signed)
Date: 06/25/2015 Patient: SAQIB SUNDE Admitted: 06/24/2015  7:26 PM Attending Provider: Standley Brooking, MD  Talbert Nan or his authorized caregiver has made the decision for the patient to leave [facility] [address] [facility] against the advice of Standley Brooking, MD.  He or his authorized caregiver has been informed and understands the inherent risks, including death.  He or his authorized caregiver has decided to accept the responsibility for this decision. Talbert Nan and all necessary parties have been advised that he may return for further evaluation or treatment. His condition at time of discharge was Fair.  Talbert Nan had current vital signs as follows:  Blood pressure 135/107, pulse 83, temperature 97.7 F (36.5 C), temperature source Oral, resp. rate 20, height 6' (1.829 m), weight 63.322 kg (139 lb 9.6 oz), SpO2 99 %.   Talbert Nan or his authorized caregiver has signed the Leaving Against Medical Advice form prior to leaving the department.  Ciro Backer 06/25/2015

## 2015-06-25 NOTE — Consult Note (Signed)
Referring Provider: No ref. provider found Primary Care Physician:  No PCP Per Patient Primary Gastroenterologist:  None  Reason for Consultation:  "GI bleed"  HPI: 49 year old African American male with polysubstance abuse admitted through the ED yesterday with acute alcohol intoxication and suicidal ideation. History of seizure disorder and poly-substance abuse including cocaine as well. In the ED, he was found to be hemodynamically stable. No apparent hematemesis or hematochezia. Sketchy history of melena. Recent loose stools overnight. Stool occult blood positive on digital rectal exam. Multiple ED visits for various complaints recently. Signed out AMA multiple times. He was admitted to the hospital. Started on PPI. He remained stable overnight although; he developed combativeness and has been started on Ativan protocol. He is somewhat somnolent and uncooperative for my interview on examination today.  Complains vaguely of abdominal pain. Has had loose nonbloody stools. Clostridium difficile toxin assay has been ordered.  He has pancytopenia. Hemoglobin this morning is 9.1 range which is in line with recent values. Apparently, no prior GI evaluation. Abdominal CT back in February demonstrated changes of chronic pancreatitis and fatty liver. Spleen not enlarged and no evidence of portal hypertension. Alcohol level yesterday over 300. Drug screen negative for cocaine. Serum lipase 16  3 days ago.     Past Medical History  Diagnosis Date  . Pancreatitis, acute     First episode earlier in 2012, hospitalized again in 02/2012 and 09/2012  . Hypertension     noncompliance  . Hyperlipidemia     noncompliance  . Lung collapse     h/o  . ETOH abuse   . Cocaine abuse   . DTs (delirium tremens)     history of  . Tobacco abuse   . Seizure disorder 07/17/2013  . Noncompliance 07/17/2013  . Traumatic subdural hematoma 06/15/2013  . 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 Open  skull fracture 06/15/2013  . Polysubstance abuse     etoh, cocaine  . Seizures   . Drug-seeking behavior   . Diabetes mellitus without complication   . Nephrolithiasis     Past Surgical History  Procedure Laterality Date  . Mandible fracture surgery  2006  . Left axillary      surgery for deep laceration    Prior to Admission medications   Medication Sig Start Date End Date Taking? Authorizing Provider  amLODipine (NORVASC) 10 MG tablet Take 1 tablet (10 mg total) by mouth daily. 05/14/15   Jerald Kief, MD  phenytoin (DILANTIN) 100 MG ER capsule Take 1 capsule (100 mg total) by mouth 3 (three) times daily. 05/14/15   Jerald Kief, MD  potassium chloride (K-DUR) 10 MEQ tablet Take 1 tablet (10 mEq total) by mouth 2 (two) times daily. Patient not taking: Reported on 06/19/2015 04/27/15   Vanetta Mulders, MD    Current Facility-Administered Medications  Medication Dose Route Frequency Provider Last Rate Last Dose  . 0.9 %  sodium chloride infusion   Intravenous Continuous Samuel Jester, DO 100 mL/hr at 06/25/15 0106    . folic acid (FOLVITE) tablet 1 mg  1 mg Oral Daily Houston Siren, MD   1 mg at 06/25/15 1012  . insulin aspart (novoLOG) injection 0-5 Units  0-5 Units Subcutaneous QHS Standley Brooking, MD      . insulin aspart (novoLOG) injection 0-9 Units  0-9 Units Subcutaneous TID WC Standley Brooking, MD   0 Units at 06/25/15 1200  . LORazepam (ATIVAN) tablet 1 mg  1 mg Oral  Q6H PRN Houston Siren, MD   1 mg at 06/25/15 1012   Or  . LORazepam (ATIVAN) injection 1 mg  1 mg Intravenous Q6H PRN Houston Siren, MD      . multivitamin with minerals tablet 1 tablet  1 tablet Oral Daily Houston Siren, MD   1 tablet at 06/25/15 1012  . pantoprazole (PROTONIX) injection 40 mg  40 mg Intravenous Q12H Standley Brooking, MD   40 mg at 06/25/15 1012  . phenytoin (DILANTIN) ER capsule 300 mg  300 mg Oral TID Phylliss Blakes, RPH   300 mg at 06/25/15 1012   Followed by  . [START ON 06/26/2015] phenytoin  (DILANTIN) ER capsule 300 mg  300 mg Oral QHS Loleta Rose Lilliston, RPH      . sodium chloride 0.9 % bolus 500 mL  500 mL Intravenous Once Samuel Jester, DO   500 mL at 06/24/15 2045  . sodium chloride 0.9 % injection 3 mL  3 mL Intravenous Q12H Houston Siren, MD   3 mL at 06/25/15 0106  . thiamine (VITAMIN B-1) tablet 100 mg  100 mg Oral Daily Houston Siren, MD   100 mg at 06/25/15 1012   Or  . thiamine (B-1) injection 100 mg  100 mg Intravenous Daily Houston Siren, MD        Allergies as of 06/24/2015  . (No Known Allergies)    Family History  Problem Relation Age of Onset  . Diabetes type II Mother   . Liver disease Neg Hx   . Colon cancer Neg Hx   . GI problems Neg Hx     History   Social History  . Marital Status: Single    Spouse Name: N/A  . Number of Children: 0  . Years of Education: N/A   Occupational History  . unemployed    Social History Main Topics  . Smoking status: Current Every Day Smoker -- 1.00 packs/day for 20 years    Types: Cigarettes  . Smokeless tobacco: Current User  . Alcohol Use: Yes     Comment: occ  . Drug Use: 3.00 per week    Special: Cocaine     Comment: former;pt. refused  pt denies drugs 03/20/15- former  . Sexual Activity: Yes   Other Topics Concern  . Not on file   Social History Narrative   Homeless, years. Stays anywhere can, abandoned homes/friends/etc.    Review of Systems:  Current mental status precludes obtaining a review of systems other than outlined above.  Physical Exam: Vital signs in last 24 hours: Temp:  [97.6 F (36.4 C)-97.8 F (36.6 C)] 97.8 F (36.6 C) (06/26 0740) Pulse Rate:  [73-86] 86 (06/26 0740) Resp:  [15-18] 18 (06/26 0740) BP: (119-128)/(82-95) 119/82 mmHg (06/26 0740) SpO2:  [98 %-100 %] 99 % (06/26 0740) Weight:  [139 lb 9.6 oz (63.322 kg)] 139 lb 9.6 oz (63.322 kg) (06/26 0011) Last BM Date: 06/25/15 General:  Found in the fetal persistent. Accompanied by a sitter.  Little verbalization or  cooperation. Eyes:  Sclera "muddy" .   Conjunctiva pink.. Neck:  Supple; no masses or thyromegaly. Lungs:  Clear throughout to auscultation.   No wheezes, crackles, or rhonchi. No acute distress. Heart:  Regular rate and rhythm; no murmurs, clicks, rubs,  or gallops. Abdomen: Nondistended. No scars. Longitudinal patent to covering right abdomen. Positive bowel sounds soft and apparently nontender to deep palpation without appreciable mass or organomegaly Rectal:  Done in the ED as described  above  Intake/Output from previous day: 06/25 0701 - 06/26 0700 In: 712.1 [I.V.:612.1; IV Piggyback:100] Out: -  Intake/Output this shift: Total I/O In: -  Out: 300 [Urine:300]  Lab Results:  Recent Labs  06/24/15 2037 06/25/15 0148 06/25/15 0631  WBC 3.9* 4.0 3.6*  HGB 8.5* 8.9* 9.1*  HCT 23.4* 24.3* 25.1*  PLT 87* 78* 73*   BMET  Recent Labs  06/22/15 1459 06/23/15 1819 06/24/15 2037  NA 133* 141 138  K 3.0* 3.9 3.4*  CL 102 113* 115*  CO2 20* 22 17*  GLUCOSE 331* 358* 252*  BUN 10 10 8   CREATININE 1.00 1.06 0.69  CALCIUM 7.8* 7.4* 7.0*   LFT  Recent Labs  06/24/15 2037  PROT 5.1*  ALBUMIN 1.8*  AST 54*  ALT 38  ALKPHOS 216*  BILITOT 4.6*   PT/INR  Recent Labs  06/24/15 2037  LABPROT 24.1*  INR 2.18*   Hepatitis Panel No results for input(s): HEPBSAG, HCVAB, HEPAIGM, HEPBIGM in the last 72 hours. C-Diff No results for input(s): CDIFFTOX in the last 72 hours.  Studies/Results: No results found.  Impression:  49 year old male with long-standing polysubstance abuse admitted with acute alcoholism, alcoholic hepatitis, pancytopenia and poorly controlled diabetes. Drug screen negative for cocaine this admission. Sketchy history of melena  - not very impressive. No overt GI bleeding documented. He is occult blood positive. 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 However, I doubt he has advanced chronic liver disease.  Findings on  CT earlier  this year reassuring in that regard. Bump in INR more likely related to malnutrition. History of chronic pancreatitis.  Some complaint of abdominal pain this admission. Abdominal exam is unremarkable. Serum lipase normal 3 days ago.  Recent loose stools of uncertain significance.  Discriminate function greater than 32 (utilizing mid range normal control in calculation) indicating a potential benefit with prednisolone therapy.  Recommendations:  Agree with supportive measures including covering him for delirium tremens. Agree with PPI.  Single dose of vitamin K today and reassess INR tomorrow.  Add serum lipase to today's labs.  No need for urgent GI intervention although I do recommend a diagnostic EGD prior to discharge and would also offer him an eventual screening colonoscopy as he is now eligible according to the [employer] of gastroenterology. He would need deep sedation for both procedures. Screen him for chronic hepatitis B and C.  Consider prednisolone therapy if viral markers come back negative. Clostridium difficile toxin assay to be sent if further loose stools. Further recommendations to follow.       Eula Listen  06/25/2015, 11:49 AM    Notice:  This dictation was prepared with Dragon dictation along with smaller phrase technology. Any transcriptional errors that result from this process are unintentional and may not be corrected upon review.

## 2015-06-25 NOTE — Progress Notes (Signed)
Pt complains of multiple loose stools, per protocol placed on enteric precautions and awaiting stool to send for Cdiff PCR.  Will continue to monitor.

## 2015-06-25 NOTE — Progress Notes (Addendum)
PROGRESS NOTE  Paul [name] FHQ:[id number] DOB: 12/02/1966 DOA: 06/24/2015 PCP: No PCP Per Patient  Summary: 49 year old man with history of ongoing substance abuse, alcohol abuse, noncompliance with medications, reported seizure disorder who has been to the emergency department 5 times within the last 12 days, having left 4 of those times AMA. He presented 6/25 with complaints of nausea, feeling weak, abdominal pain, dark stools (according to my interview with him this morning). Nursing notes from yesterday noted suicidal ideation which the patient continues to endorse.  Assessment/Plan: 1. Alcohol abuse with intoxication on admission, associated alcoholic hepatitis, coagulopathy, thrombocytopenia, leukopenia (pancytopenia). MELD 21 (19.6% 3 month mortality). 2. Possible GI bleed? No evidence of ongoing bleeding. Overall slowly hemoglobin appears stable, suspect slow GI blood loss at most. 3. Normocytic anemia, stable, possible subacute/chronic GIB. Hgb stable last 2 months. 4. SI. He does endorse this. 5. Non-AG metabolic acidosis. Likely secondary to alcohol intake and dehydration. 6. Polysubstance abuse (ongoing alcohol, tobacco; h/o cocaine). Urine drug screen was negative. No evidence of withdrawal at this point. 7. DM, non-compliant, anion gap and random blood sugar stable on admission. 8. Seizure disorder, noncompliant with medications. Phenytoin level low. 9. Noncompliance 10. Chart review at bottom of note   He appears medically stable. There is no evidence of significant blood loss. He is alert and oriented. He does not appear to be a danger to himself or others at this point however, we will proceed with psychiatry evaluation. Continue Recruitment consultant.  GI consultation is pending. In meantime, change to twice a day Protonix. Can likely advance diet, will await GI evaluation.  CIWA.  CBC, CMP, INR in AM  Substance abuse counseling  SSI  Pharmacy to dose  phenytoin  Needs to follow-up with PCP ([name]). Recommend GI follow-up given her chronic alcoholic hepatitis.  Code Status: full code DVT prophylaxis: SCDs Family Communication: none Disposition Plan: pending  Brendia Sacks, MD  [facility]  Pager [phone] If 7PM-7AM, please contact night-coverage at [email or web address], password [facility] 06/25/2015, 7:38 AM    Consultants:  Gastroenterology  Procedures:    Antibiotics:    HPI/Subjective: Hungry. Wants to keep. Notes generalized abdominal pain. No vomiting. He does endorse suicidal ideation.  Objective: Filed Vitals:   06/24/15 2210 06/25/15 0011  BP: 128/91 124/95  Pulse: 81 73  Temp: 97.6 F (36.4 C)   TempSrc: Oral Axillary  Resp: 15 18  Height:  6' (1.829 m)  Weight:  63.322 kg (139 lb 9.6 oz)  SpO2: 100% 98%    Intake/Output Summary (Last 24 hours) at 06/25/15 0738 Last data filed at 06/25/15 0600  Gross per 24 hour  Intake 712.08 ml  Output      0 ml  Net 712.08 ml     Filed Weights   06/25/15 0011  Weight: 63.322 kg (139 lb 9.6 oz)    Exam:     Afebrile, VSS, no hypoxia General: Appears calm and comfortable. He appears to have aged significantly since the last time I saw him.  Cardiovascular: RRR, no m/r/g. No LE edema. Respiratory: CTA bilaterally, no w/r/r. Normal respiratory effort. Abdomen: soft, ntnd Skin: no rash or induration noted Musculoskeletal: grossly normal tone BUE/BLE Psychiatric: grossly normal mood and affect, speech fluent and appropriate. He is alert and oriented to self, location, month, year. history consistent with that documented in the chart. Neurologic: grossly non-focal.  New data reviewed:  1 stool documented  CBG stable  Hgb stable, 9.1  Plts stable 73  Phenytoin low  Pertinent data since admission:  T bili 4.6, albumen 1.8, INR 2.18  Urine drug screen negative  Hemoccult-positive  Pending data:    Scheduled Meds: .  folic acid  1 mg Oral Daily  . multivitamin with minerals  1 tablet Oral Daily  . phenytoin  100 mg Oral TID  . sodium chloride  500 mL Intravenous Once  . sodium chloride  3 mL Intravenous Q12H  . thiamine  100 mg Oral Daily   Or  . thiamine  100 mg Intravenous Daily   Continuous Infusions: . sodium chloride 100 mL/hr at 06/25/15 0106  . pantoprozole (PROTONIX) infusion 8 mg/hr (06/25/15 0107)    Principal Problem:   GI bleed Active Problems:   Alcohol abuse   Tobacco abuse   Seizure disorder   Noncompliance   Other pancytopenia   Suicidal ideation   Alcoholic hepatitis without ascites   DM type 2 (diabetes mellitus, type 2)   Time spent 35 minutes   6/14 emergency department visit for seizure complaint. Workup was generally unremarkable but patient left AMA after being found smoking in his room.  6/20 emergency department visit presented via EMS when he was found sleeping by [employer]. Eloped leaving via fire escape and left prior to being seen.  6/21 emergency department visit presented with complaint of possible seizure and high blood sugar. Patient left AMA.  6/23 emergency department visit presented for staple removal, nausea and vomiting. After staple removal the patient left AMA.  6/24 emergency department visit for possible seizure, nausea, reported drinking a pint of wine. Hyperglycemia was treated and discharged.

## 2015-06-25 NOTE — Progress Notes (Signed)
Tele Psych consult called and set up.  Appt for call to pt will be approximately 1500 - 1530 today.  Pt has denied any SI/HI at this time, but stated " I have thought about it in the past" with no plan.  Will continue to monitor pt.  Sitter at bedside.

## 2015-06-26 LAB — HEPATITIS B SURFACE ANTIGEN: Hepatitis B Surface Ag: NEGATIVE

## 2015-06-26 LAB — HEPATITIS C ANTIBODY: HCV Ab: <0.1 {s_co_ratio} (ref 0.0–0.9)

## 2015-06-26 NOTE — Discharge Summary (Signed)
Physician Discharge Summary  Paul [name] UEA:[id number] DOB: 1966/10/01 DOA: 06/24/2015  PCP: No PCP Per Patient   PATIENT LEFT AMA  Admit date: 06/24/2015 Discharge date: 06/26/2015  Discharge Diagnoses:  1. Alcohol abuse with intoxication on admission 2. Alcoholic hepatitis, coagulopathy, thrombocytopenia, leukopenia 3. Possible GI bleed 4. Stable normocytic anemia, possible chronic GI blood loss 5. Suicidal ideation, resolved 6. Non-anion gap metabolic acidosis 7. Polysubstance abuse, ongoing alcohol use 8. Diabetes mellitus, noncompliant 9. Seizure disorder, noncompliant with medications  Discharge Condition: Fair, stable Disposition: Patient left AGAINST MEDICAL ADVICE    Filed Weights   06/25/15 0011  Weight: 63.322 kg (139 lb 9.6 oz)    History of present illness:  49 year old man with history of substance abuse, ongoing alcohol abuse, noncompliance with medications and follow-up, presented to the emergency department 5 times within the last 12 days, left AMA 4 times. Presented again 6/25 with complaints of nausea, weakness, abdominal pain and dark stools. Also endorsed suicidal ideation.   Hospital Course:  Mr. Cubbage was admitted, treated with supportive care, seen by GI in consultation. He had no evidence of ongoing bleeding and hemoglobin remained stable. Laboratory work did reveal alcoholic hepatitis with multiple laboratory abnormalities, subacute. GI recommended EGD while an inpatient and supportive care. Prednisolone was being contemplated while acute hepatitis panel was in process. clinically he remained stable without evidence of alcohol withdrawal and he remained alert and oriented. Because of suicidal ideation he was placed on suicide precautions and seen by tele psychiatry with recommendations for outpatient treatment. He was not felt to be a danger to himself or others and IVC was rescinded. I did discuss the recommendations with the consulting provider. He  was cleared for outpatient follow-up.  The patient reported he wanted to leave, I sat down with him at the bedside with his nurse Lurena Joiner present, I reviewed my clinical concerns including alcoholic hepatitis with severe laboratory abnormalities. We discussed the toxic effect of alcohol and his guarded prognosis. He was alert, oriented to self, location, month and year. He clearly expressed his understanding of his illness, "my liver is shot" and understood the gravity of his condition and implications of refusal of treatment . Nevertheless he ultimately chose to leave AGAINST MEDICAL ADVICE.   No Known Allergies  The results of significant diagnostics from this hospitalization (including imaging, microbiology, ancillary and laboratory) are listed below for reference.      Labs: Basic Metabolic Panel:  Recent Labs Lab 06/19/15 1427 06/22/15 1459 06/23/15 1819 06/24/15 2037  NA 142 133* 141 138  K 3.5 3.0* 3.9 3.4*  CL 110 102 113* 115*  CO2 23 20* 22 17*  GLUCOSE 337* 331* 358* 252*  BUN CREATININE 0.95 1.00 1.06 0.69  CALCIUM 7.5* 7.8* 7.4* 7.0*   Liver Function Tests:  Recent Labs Lab 06/22/15 1459 06/24/15 2037  AST 49* 54*  ALT 46 38  ALKPHOS 255* 216*  BILITOT 5.6* 4.6*  PROT 5.5* 5.1*  ALBUMIN 1.9* 1.8*    Recent Labs Lab 06/22/15 1459 06/25/15 0624  LIPASE 15* 13*   CBC:  Recent Labs Lab 06/19/15 1427 06/22/15 1459 06/23/15 1819 06/24/15 2037 06/25/15 0148 06/25/15 0631  WBC 3.5* 3.6* 3.8* 3.9* 4.0 3.6*  NEUTROABS 1.9 2.0 2.1 2.1  --   --   HGB 10.4* 9.5* 8.7* 8.5* 8.9* 9.1*  HCT 30.5* 26.3* 24.0* 23.4* 24.3* 25.1*  MCV 89.2 86.2 85.7 85.1 84.4 84.2  PLT 143* 98* 103* 87* 78*  73*    CBG:  Recent Labs Lab 06/23/15 2032 06/25/15 0027 06/25/15 0718 06/25/15 1114 06/25/15 1612  GLUCAP 240* 152* 134* 116* 178*    Principal Problem:   GI bleed Active Problems:   Alcohol abuse   Tobacco abuse   Seizure disorder    Noncompliance   Other pancytopenia   Suicidal ideation   Alcoholic hepatitis without ascites   DM type 2 (diabetes mellitus, type 2)   Fecal occult blood test positive   Time spent: 40 minutes  Signed:  Brendia Sacks, MD [facility] 06/26/2015, 7:21 AM

## 2015-06-28 ENCOUNTER — Encounter: Payer: Self-pay | Admitting: Internal Medicine

## 2015-06-28 ENCOUNTER — Encounter (HOSPITAL_COMMUNITY): Payer: Self-pay | Admitting: *Deleted

## 2015-06-28 ENCOUNTER — Emergency Department (HOSPITAL_COMMUNITY)
Admission: EM | Admit: 2015-06-28 | Discharge: 2015-06-28 | Discharge status: Against Medical Advice | Payer: MEDICAID | Attending: Emergency Medicine | Admitting: Emergency Medicine

## 2015-06-28 DIAGNOSIS — E119 Type 2 diabetes mellitus without complications: Secondary | ICD-10-CM | POA: Diagnosis not present

## 2015-06-28 DIAGNOSIS — Z9119 Patient's noncompliance with other medical treatment and regimen: Secondary | ICD-10-CM | POA: Diagnosis not present

## 2015-06-28 DIAGNOSIS — G40909 Epilepsy, unspecified, not intractable, without status epilepticus: Secondary | ICD-10-CM | POA: Insufficient documentation

## 2015-06-28 DIAGNOSIS — Z8719 Personal history of other diseases of the digestive system: Secondary | ICD-10-CM | POA: Insufficient documentation

## 2015-06-28 DIAGNOSIS — F1010 Alcohol abuse, uncomplicated: Secondary | ICD-10-CM | POA: Insufficient documentation

## 2015-06-28 DIAGNOSIS — Z8709 Personal history of other diseases of the respiratory system: Secondary | ICD-10-CM | POA: Insufficient documentation

## 2015-06-28 DIAGNOSIS — Z79899 Other long term (current) drug therapy: Secondary | ICD-10-CM | POA: Insufficient documentation

## 2015-06-28 DIAGNOSIS — Z87442 Personal history of urinary calculi: Secondary | ICD-10-CM | POA: Insufficient documentation

## 2015-06-28 DIAGNOSIS — Z720 Tobacco use: Secondary | ICD-10-CM | POA: Insufficient documentation

## 2015-06-28 DIAGNOSIS — I10 Essential (primary) hypertension: Secondary | ICD-10-CM | POA: Insufficient documentation

## 2015-06-28 DIAGNOSIS — F10129 Alcohol abuse with intoxication, unspecified: Secondary | ICD-10-CM | POA: Diagnosis present

## 2015-06-28 MED ORDER — PHENYTOIN SODIUM EXTENDED 100 MG PO CAPS
400.0000 mg | ORAL_CAPSULE | Freq: Once | ORAL | Status: AC
  Administered 2015-06-28: 400 mg via ORAL
  Filled 2015-06-28: qty 4

## 2015-06-28 NOTE — ED Notes (Signed)
Pt belligerent at this time.  Requesting IV to be removed and stating that if we do not remove it he will do it himself.  IV removed and bandaid applied.  Pt ambulatory to bathroom to put dirty clothes back on even though he is wearing disposable scrubs.  Pt was provided with meal tray which he states "Imma take that to go".  Pt insistent on leaving AMA.

## 2015-06-28 NOTE — ED Notes (Signed)
Patient left department ambulatory at this time. Refused to sign out.

## 2015-06-28 NOTE — ED Notes (Signed)
Patient continues to yell profane, vulgar remarks. Patient states he used the restroom on hisself. Patient assisted to restroom by [facility], tech and cleaned up.

## 2015-06-28 NOTE — Progress Notes (Signed)
Quick Note:  OV made for 7/19 at 930 with EG and letter mailed. ______

## 2015-06-28 NOTE — ED Notes (Addendum)
Pt here via ems. Verbally abusive to staff. EMS called due "pending seizure". Pt stated he felt like he was going to have a seizure. Pt is very intoxicated. On arrival, pt states, "I have to shit and I want a blanket." Explained that we needed to get him triaged and he stated, "Fuck you, you bitch." and then ambulated to restroom. CBG of 328 en route per EMS. IV in place by EMS.

## 2015-06-28 NOTE — ED Notes (Signed)
Pt woke up stating, "I want so breakfast." Explained to pt that it wasn't time for breakfast and I offered him some crackers. Pt began cursing once again and refused crakers

## 2015-06-28 NOTE — ED Provider Notes (Signed)
CSN: [id number]     Arrival date & time 06/28/15  1149 History   First MD Initiated Contact with Patient 06/28/15 1210     Chief Complaint  Patient presents with  . Alcohol Intoxication     (Consider location/radiation/quality/duration/timing/severity/associated sxs/prior Treatment) Patient is a 49 y.o. male presenting with intoxication.  Alcohol Intoxication   patient presents with reported seizure. Reportedly notes said he was belligerent and was worried as can have a seizure. States he has been drinking today. States he only had a pint. He told me that he did have a seizure. States he's not been taking his medicine for a couple months. States he does not have the money.   Level V caveat due to intoxication.  He has been belligerent and yelling in the ER.  Past Medical History  Diagnosis Date  . Pancreatitis, acute     First episode earlier in 2012, hospitalized again in 02/2012 and 09/2012  . Hypertension     noncompliance  . Hyperlipidemia     noncompliance  . Lung collapse     h/o  . ETOH abuse   . Cocaine abuse   . DTs (delirium tremens)     history of  . Tobacco abuse   . Seizure disorder 07/17/2013  . Noncompliance 07/17/2013  . Traumatic subdural hematoma 06/15/2013  . 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 Open skull fracture 06/15/2013  . Polysubstance abuse     etoh, cocaine  . Seizures   . Drug-seeking behavior   . Diabetes mellitus without complication   . Nephrolithiasis    Past Surgical History  Procedure Laterality Date  . Mandible fracture surgery  2006  . Left axillary      surgery for deep laceration   Family History  Problem Relation Age of Onset  . Diabetes type II Mother   . Liver disease Neg Hx   . Colon cancer Neg Hx   . GI problems Neg Hx    History  Substance Use Topics  . Smoking status: Current Every Day Smoker -- 1.00 packs/day for 20 years    Types: Cigarettes  . Smokeless tobacco: Current User  . Alcohol Use: Yes   Comment: occ    Review of Systems  Unable to perform ROS     Allergies  Review of patient's allergies indicates no known allergies.  Home Medications   Prior to Admission medications   Medication Sig Start Date End Date Taking? Authorizing Provider  amLODipine (NORVASC) 10 MG tablet Take 1 tablet (10 mg total) by mouth daily. 05/14/15   Jerald Kief, MD  phenytoin (DILANTIN) 100 MG ER capsule Take 1 capsule (100 mg total) by mouth 3 (three) times daily. 05/14/15   Jerald Kief, MD  potassium chloride (K-DUR) 10 MEQ tablet Take 1 tablet (10 mEq total) by mouth 2 (two) times daily. Patient not taking: Reported on 06/19/2015 04/27/15   Vanetta Mulders, MD   BP 124/90 mmHg  Pulse 85  Temp(Src) 97.8 F (36.6 C) (Oral)  Resp 16  SpO2 100% Physical Exam  Constitutional: He appears well-developed.  HENT:  Head: Normocephalic and atraumatic.  Eyes: Pupils are equal, round, and reactive to light.  Neck: Neck supple.  Cardiovascular: Normal rate.   Pulmonary/Chest: Effort normal.  Abdominal: Soft. There is no tenderness.  Neurological: He is alert.  Patient is belligerent and appears intoxicated  Skin: Skin is warm.    ED Course  Procedures (including critical care time) Labs Review Labs Reviewed -  No data to display  Imaging Review No results found.   EKG Interpretation None      MDM   Final diagnoses:  Alcohol abuse    Patient with alcohol abuse. States he had a seizure. Unclear if this happened or not. States he has not been taking his Dilantin. Gave 400 mg oral load patient was not willing to stay any longer. He was belligerent still somewhat intoxicated but did not have criteria to keep him involuntarily. Discharged AMA    Benjiman CoreNathan Deejay Koppelman, MD 06/29/15 [phone]

## 2015-06-29 ENCOUNTER — Emergency Department (HOSPITAL_COMMUNITY)
Admission: EM | Admit: 2015-06-29 | Discharge: 2015-06-29 | Disposition: A | Discharge status: Home / Self Care | Payer: MEDICAID | Attending: Emergency Medicine | Admitting: Emergency Medicine

## 2015-06-29 ENCOUNTER — Emergency Department (HOSPITAL_COMMUNITY)
Admission: EM | Admit: 2015-06-29 | Discharge: 2015-06-29 | Disposition: A | Discharge status: Home / Self Care | Payer: MEDICAID | Source: Home / Self Care | Attending: Emergency Medicine | Admitting: Emergency Medicine

## 2015-06-29 ENCOUNTER — Encounter (HOSPITAL_COMMUNITY): Payer: Self-pay

## 2015-06-29 ENCOUNTER — Encounter (HOSPITAL_COMMUNITY): Payer: Self-pay | Admitting: Emergency Medicine

## 2015-06-29 DIAGNOSIS — E119 Type 2 diabetes mellitus without complications: Secondary | ICD-10-CM | POA: Insufficient documentation

## 2015-06-29 DIAGNOSIS — Z7289 Other problems related to lifestyle: Secondary | ICD-10-CM | POA: Insufficient documentation

## 2015-06-29 DIAGNOSIS — R1084 Generalized abdominal pain: Secondary | ICD-10-CM | POA: Diagnosis not present

## 2015-06-29 DIAGNOSIS — Z720 Tobacco use: Secondary | ICD-10-CM | POA: Insufficient documentation

## 2015-06-29 DIAGNOSIS — Z79899 Other long term (current) drug therapy: Secondary | ICD-10-CM | POA: Insufficient documentation

## 2015-06-29 DIAGNOSIS — G40909 Epilepsy, unspecified, not intractable, without status epilepticus: Secondary | ICD-10-CM | POA: Insufficient documentation

## 2015-06-29 DIAGNOSIS — R112 Nausea with vomiting, unspecified: Secondary | ICD-10-CM | POA: Diagnosis not present

## 2015-06-29 DIAGNOSIS — Z87828 Personal history of other (healed) physical injury and trauma: Secondary | ICD-10-CM | POA: Insufficient documentation

## 2015-06-29 DIAGNOSIS — Z8709 Personal history of other diseases of the respiratory system: Secondary | ICD-10-CM | POA: Diagnosis not present

## 2015-06-29 DIAGNOSIS — Z8781 Personal history of (healed) traumatic fracture: Secondary | ICD-10-CM | POA: Insufficient documentation

## 2015-06-29 DIAGNOSIS — Z8719 Personal history of other diseases of the digestive system: Secondary | ICD-10-CM | POA: Diagnosis not present

## 2015-06-29 DIAGNOSIS — I10 Essential (primary) hypertension: Secondary | ICD-10-CM | POA: Insufficient documentation

## 2015-06-29 DIAGNOSIS — Z87442 Personal history of urinary calculi: Secondary | ICD-10-CM

## 2015-06-29 DIAGNOSIS — F10120 Alcohol abuse with intoxication, uncomplicated: Secondary | ICD-10-CM | POA: Insufficient documentation

## 2015-06-29 DIAGNOSIS — F10920 Alcohol use, unspecified with intoxication, uncomplicated: Secondary | ICD-10-CM

## 2015-06-29 DIAGNOSIS — Z9119 Patient's noncompliance with other medical treatment and regimen: Secondary | ICD-10-CM | POA: Insufficient documentation

## 2015-06-29 LAB — COMPREHENSIVE METABOLIC PANEL
ALBUMIN: 1.9 g/dL — AB (ref 3.5–5.0)
ALK PHOS: 215 U/L — AB (ref 38–126)
ALT: 46 U/L (ref 17–63)
ANION GAP: 11 (ref 5–15)
AST: 87 U/L — AB (ref 15–41)
BILIRUBIN TOTAL: 8.3 mg/dL — AB (ref 0.3–1.2)
BUN: 10 mg/dL (ref 6–20)
CO2: 16 mmol/L — ABNORMAL LOW (ref 22–32)
CREATININE: 0.84 mg/dL (ref 0.61–1.24)
Calcium: 7.4 mg/dL — ABNORMAL LOW (ref 8.9–10.3)
Chloride: 110 mmol/L (ref 101–111)
GFR calc Af Amer: >60 mL/min (ref >60)
GLUCOSE: 214 mg/dL — AB (ref 65–99)
Potassium: 3.7 mmol/L (ref 3.5–5.1)
SODIUM: 137 mmol/L (ref 135–145)
Total Protein: 5.8 g/dL — ABNORMAL LOW (ref 6.5–8.1)

## 2015-06-29 LAB — CBC WITH DIFFERENTIAL/PLATELET
BASOS ABS: 0 10*3/uL (ref 0.0–0.1)
Basophils Relative: 0 % (ref 0–1)
EOS PCT: 0 % (ref 0–5)
Eosinophils Absolute: 0 10*3/uL (ref 0.0–0.7)
HEMATOCRIT: 27 % — AB (ref 39.0–52.0)
Hemoglobin: 9.5 g/dL — ABNORMAL LOW (ref 13.0–17.0)
LYMPHS PCT: 18 % (ref 12–46)
Lymphs Abs: 1 10*3/uL (ref 0.7–4.0)
MCH: 30.2 pg (ref 26.0–34.0)
MCHC: 35.2 g/dL (ref 30.0–36.0)
MCV: 85.7 fL (ref 78.0–100.0)
Monocytes Absolute: 0.2 10*3/uL (ref 0.1–1.0)
Monocytes Relative: 4 % (ref 3–12)
NEUTROS PCT: 77 % (ref 43–77)
Neutro Abs: 4 10*3/uL (ref 1.7–7.7)
PLATELETS: 90 10*3/uL — AB (ref 150–400)
RBC: 3.15 MIL/uL — ABNORMAL LOW (ref 4.22–5.81)
RDW: 19.5 % — ABNORMAL HIGH (ref 11.5–15.5)
WBC: 5.2 10*3/uL (ref 4.0–10.5)

## 2015-06-29 LAB — LIPASE, BLOOD: Lipase: 17 U/L — ABNORMAL LOW (ref 22–51)

## 2015-06-29 MED ORDER — THIAMINE HCL 100 MG/ML IJ SOLN
100.0000 mg | Freq: Once | INTRAMUSCULAR | Status: AC
  Administered 2015-06-29: 100 mg via INTRAVENOUS
  Filled 2015-06-29: qty 2

## 2015-06-29 MED ORDER — ONDANSETRON HCL 4 MG/2ML IJ SOLN
4.0000 mg | Freq: Once | INTRAMUSCULAR | Status: AC
  Administered 2015-06-29: 4 mg via INTRAVENOUS
  Filled 2015-06-29: qty 2

## 2015-06-29 MED ORDER — SODIUM CHLORIDE 0.9 % IV BOLUS (SEPSIS)
1000.0000 mL | Freq: Once | INTRAVENOUS | Status: AC
  Administered 2015-06-29: 1000 mL via INTRAVENOUS

## 2015-06-29 NOTE — ED Notes (Signed)
Patient arrives via EMS with c/o abdominal pain and vomiting. States he has been hurting all day and vomiting all day. Patient just finished drinking a pint of ETOH. No vomiting noted. Patient is cooperative at present.

## 2015-06-29 NOTE — ED Notes (Signed)
Was here earlier for possible seizures. Was picked up again at the same place complaining of abdominal pain and headache.

## 2015-06-29 NOTE — ED Provider Notes (Signed)
CSN: [id number]     Arrival date & time 06/29/15  1311 History   First MD Initiated Contact with Patient 06/29/15 1458     Chief Complaint  Patient presents with  . Alcohol Intoxication     (Consider location/radiation/quality/duration/timing/severity/associated sxs/prior Treatment) HPI Comments: Patient presents to the ER for evaluation of nausea, vomiting and abdominal pain. Patient reports that he awakened this morning with similar symptoms. Patient reports constant abdominal pain and inability to eat or drink today. He is, although, asking for food immediately as I entered the room. Patient admits to drinking alcohol.  Patient is a 49 y.o. male presenting with intoxication.  Alcohol Intoxication Associated symptoms include abdominal pain.    Past Medical History  Diagnosis Date  . Pancreatitis, acute     First episode earlier in 2012, hospitalized again in 02/2012 and 09/2012  . Hypertension     noncompliance  . Hyperlipidemia     noncompliance  . Lung collapse     h/o  . ETOH abuse   . Cocaine abuse   . DTs (delirium tremens)     history of  . Tobacco abuse   . Seizure disorder 07/17/2013  . Noncompliance 07/17/2013  . Traumatic subdural hematoma 06/15/2013  . 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 Open skull fracture 06/15/2013  . Polysubstance abuse     etoh, cocaine  . Seizures   . Drug-seeking behavior   . Diabetes mellitus without complication   . Nephrolithiasis    Past Surgical History  Procedure Laterality Date  . Mandible fracture surgery  2006  . Left axillary      surgery for deep laceration   Family History  Problem Relation Age of Onset  . Diabetes type II Mother   . Liver disease Neg Hx   . Colon cancer Neg Hx   . GI problems Neg Hx    History  Substance Use Topics  . Smoking status: Current Every Day Smoker -- 1.00 packs/day for 20 years    Types: Cigarettes  . Smokeless tobacco: Current User  . Alcohol Use: Yes     Comment: occ     Review of Systems  Gastrointestinal: Positive for nausea, vomiting and abdominal pain.  All other systems reviewed and are negative.     Allergies  Review of patient's allergies indicates no known allergies.  Home Medications   Prior to Admission medications   Medication Sig Start Date End Date Taking? Authorizing Provider  phenytoin (DILANTIN) 100 MG ER capsule Take 1 capsule (100 mg total) by mouth 3 (three) times daily. Patient not taking: Reported on 06/29/2015 05/14/15   Jerald Kief, MD  potassium chloride (K-DUR) 10 MEQ tablet Take 1 tablet (10 mEq total) by mouth 2 (two) times daily. Patient not taking: Reported on 06/19/2015 04/27/15   Vanetta Mulders, MD   BP 131/96 mmHg  Pulse 89  Temp(Src) 98.7 F (37.1 C)  Resp 14  Wt 139 lb (63.05 kg)  SpO2 97% Physical Exam  Constitutional: He is oriented to person, place, and time. He appears well-developed and well-nourished. No distress.  HENT:  Head: Normocephalic and atraumatic.  Right Ear: Hearing normal.  Left Ear: Hearing normal.  Nose: Nose normal.  Mouth/Throat: Oropharynx is clear and moist and mucous membranes are normal.  Eyes: Conjunctivae and EOM are normal. Pupils are equal, round, and reactive to light.  Neck: Normal range of motion. Neck supple.  Cardiovascular: Regular rhythm, S1 normal and S2 normal.  Exam reveals no gallop  and no friction rub.   No murmur heard. Pulmonary/Chest: Effort normal and breath sounds normal. No respiratory distress. He exhibits no tenderness.  Abdominal: Soft. Normal appearance and bowel sounds are normal. There is no hepatosplenomegaly. There is tenderness in the epigastric area. There is no rebound, no guarding, no tenderness at McBurney's point and negative Murphy's sign. No hernia.  Musculoskeletal: Normal range of motion.  Neurological: He is alert and oriented to person, place, and time. He has normal strength. No cranial nerve deficit or sensory deficit. Coordination  normal. GCS eye subscore is 4. GCS verbal subscore is 5. GCS motor subscore is 6.  Skin: Skin is warm, dry and intact. No rash noted. No cyanosis.  Psychiatric: He has a normal mood and affect. His speech is normal and behavior is normal. Thought content normal.  Nursing note and vitals reviewed.   ED Course  Procedures (including critical care time) Labs Review Labs Reviewed  COMPREHENSIVE METABOLIC PANEL - Abnormal; Notable for the following:    CO2 16 (*)    Glucose, Bld 214 (*)    Calcium 7.4 (*)    Total Protein 5.8 (*)    Albumin 1.9 (*)    AST 87 (*)    Alkaline Phosphatase 215 (*)    Total Bilirubin 8.3 (*)    All other components within normal limits  LIPASE, BLOOD - Abnormal; Notable for the following:    Lipase 17 (*)    All other components within normal limits  CBC WITH DIFFERENTIAL/PLATELET    Imaging Review No results found.   EKG Interpretation   Date/Time:  Thursday June 29 2015 15:49:11 EDT Ventricular Rate:  93 PR Interval:  126 QRS Duration: 101 QT Interval:  382 QTC Calculation: 475 R Axis:   55 Text Interpretation:  Sinus rhythm Borderline repolarization abnormality  Borderline prolonged QT interval No significant change since last tracing  Confirmed by Aleighna Wojtas  MD, Ulysse Siemen [phone]([id number]) on 06/29/2015 4:04:21 PM      MDM   Final diagnoses:  Alcohol intoxication, uncomplicated  Generalized abdominal pain    Patient presents to the ER for evaluation of nausea and vomiting. This is likely secondary to his alcohol intake. He had a fairly benign abdominal exam and his labs were unremarkable. Patient is only somewhat intoxicated, appears to be awake and alert and mentating well. He was observed here in the ER for a period of time and hydrated. After this time he was feeling improved, has to be discharged and was discharged, follow-up as needed.    Gilda Creasehristopher J Hollie Wojahn, MD 06/29/15 [phone]

## 2015-06-29 NOTE — Discharge Instructions (Signed)
Alcohol Intoxication °Alcohol intoxication occurs when you drink enough alcohol that it affects your ability to function. It can be mild or very severe. Drinking a lot of alcohol in a short time is called binge drinking. This can be very harmful. Drinking alcohol can also be more dangerous if you are taking medicines or other drugs. Some of the effects caused by alcohol may include: °· Loss of coordination. °· Changes in mood and behavior. °· Unclear thinking. °· Trouble talking (slurred speech). °· Throwing up (vomiting). °· Confusion. °· Slowed breathing. °· Twitching and shaking (seizures). °· Loss of consciousness. °HOME CARE °· Do not drive after drinking alcohol. °· Drink enough water and fluids to keep your pee (urine) clear or pale yellow. Avoid caffeine. °· Only take medicine as told by your doctor. °GET HELP IF: °· You throw up (vomit) many times. °· You do not feel better after a few days. °· You frequently have alcohol intoxication. Your doctor can help decide if you should see a substance use treatment counselor. °GET HELP RIGHT AWAY IF: °· You become shaky when you stop drinking. °· You have twitching and shaking. °· You throw up blood. It may look bright red or like coffee grounds. °· You notice blood in your poop (bowel movements). °· You become lightheaded or pass out (faint). °MAKE SURE YOU:  °· Understand these instructions. °· Will watch your condition. °· Will get help right away if you are not doing well or get worse. °Document Released: 06/03/2008 Document Revised: 08/18/2013 Document Reviewed: 05/21/2013 °Exit[employer]® Patient Information ©2015 [employer]. This information is not intended to replace advice given to you by your health care provider. Make sure you discuss any questions you have with your health care provider. ° °

## 2015-06-29 NOTE — Discharge Instructions (Signed)
Abdominal Pain °Many things can cause abdominal pain. Usually, abdominal pain is not caused by a disease and will improve without treatment. It can often be observed and treated at home. Your health care provider will do a physical exam and possibly order blood tests and X-rays to help determine the seriousness of your pain. However, in many cases, more time must pass before a clear cause of the pain can be found. Before that point, your health care provider may not know if you need more testing or further treatment. °HOME CARE INSTRUCTIONS  °Monitor your abdominal pain for any changes. The following actions may help to alleviate any discomfort you are experiencing: °· Only take over-the-counter or prescription medicines as directed by your health care provider. °· Do not take laxatives unless directed to do so by your health care provider. °· Try a clear liquid diet (broth, tea, or water) as directed by your health care provider. Slowly move to a bland diet as tolerated. °SEEK MEDICAL CARE IF: °· You have unexplained abdominal pain. °· You have abdominal pain associated with nausea or diarrhea. °· You have pain when you urinate or have a bowel movement. °· You experience abdominal pain that wakes you in the night. °· You have abdominal pain that is worsened or improved by eating food. °· You have abdominal pain that is worsened with eating fatty foods. °· You have a fever. °SEEK IMMEDIATE MEDICAL CARE IF:  °· Your pain does not go away within 2 hours. °· You keep throwing up (vomiting). °· Your pain is felt only in portions of the abdomen, such as the right side or the left lower portion of the abdomen. °· You pass bloody or black tarry stools. °MAKE SURE YOU: °· Understand these instructions.   °· Will watch your condition.   °· Will get help right away if you are not doing well or get worse.   °Document Released: 09/25/2005 Document Revised: 12/21/2013 Document Reviewed: 08/25/2013 °[employer]® Patient Information  ©2015 [employer]. This information is not intended to replace advice given to you by your health care provider. Make sure you discuss any questions you have with your health care provider. ° °Alcohol Intoxication °Alcohol intoxication occurs when the amount of alcohol that a person has consumed impairs his or her ability to mentally and physically function. Alcohol directly impairs the normal chemical activity of the brain. Drinking large amounts of alcohol can lead to changes in mental function and behavior, and it can cause many physical effects that can be harmful.  °Alcohol intoxication can range in severity from mild to very severe. Various factors can affect the level of intoxication that occurs, such as the person's age, gender, weight, frequency of alcohol consumption, and the presence of other medical conditions (such as diabetes, seizures, or heart conditions). Dangerous levels of alcohol intoxication may occur when people drink large amounts of alcohol in a short period (binge drinking). Alcohol can also be especially dangerous when combined with certain prescription medicines or "recreational" drugs. °SIGNS AND SYMPTOMS °Some common signs and symptoms of mild alcohol intoxication include: °· Loss of coordination. °· Changes in mood and behavior. °· Impaired judgment. °· Slurred speech. °As alcohol intoxication progresses to more severe levels, other signs and symptoms will appear. These may include: °· Vomiting. °· Confusion and impaired memory. °· Slowed breathing. °· Seizures. °· Loss of consciousness. °DIAGNOSIS  °Your health care provider will take a medical history and perform a physical exam. You will be asked about the amount and type   of alcohol you have consumed. Blood tests will be done to measure the concentration of alcohol in your blood. In many places, your blood alcohol level must be lower than 80 mg/dL (0.08%) to legally drive. However, many dangerous effects of alcohol can occur at  much lower levels.  °TREATMENT  °People with alcohol intoxication often do not require treatment. Most of the effects of alcohol intoxication are temporary, and they go away as the alcohol naturally leaves the body. Your health care provider will monitor your condition until you are stable enough to go home. Fluids are sometimes given through an IV access tube to help prevent dehydration.  °HOME CARE INSTRUCTIONS °· Do not drive after drinking alcohol. °· Stay hydrated. Drink enough water and fluids to keep your urine clear or pale yellow. Avoid caffeine.   °· Only take over-the-counter or prescription medicines as directed by your health care provider.   °SEEK MEDICAL CARE IF:  °· You have persistent vomiting.   °· You do not feel better after a few days. °· You have frequent alcohol intoxication. Your health care provider can help determine if you should see a substance use treatment counselor. °SEEK IMMEDIATE MEDICAL CARE IF:  °· You become shaky or tremble when you try to stop drinking.   °· You shake uncontrollably (seizure).   °· You throw up (vomit) blood. This may be bright red or may look like black coffee grounds.   °· You have blood in your stool. This may be bright red or may appear as a black, tarry, bad smelling stool.   °· You become lightheaded or faint.   °MAKE SURE YOU:  °· Understand these instructions. °· Will watch your condition. °· Will get help right away if you are not doing well or get worse. °Document Released: 09/25/2005 Document Revised: 08/18/2013 Document Reviewed: 05/21/2013 °[employer]® Patient Information ©2015 [employer]. This information is not intended to replace advice given to you by your health care provider. Make sure you discuss any questions you have with your health care provider. ° °

## 2015-06-29 NOTE — ED Notes (Signed)
Patient states that the lights went out and that he was unable to get anything to eat. States that he did not get anything to eat here earlier.

## 2015-06-29 NOTE — ED Provider Notes (Signed)
CSN: [id number]     Arrival date & time 06/29/15  1909 History   First MD Initiated Contact with Patient 06/29/15 1928     Chief Complaint  Patient presents with  . Abdominal Pain     (Consider location/radiation/quality/duration/timing/severity/associated sxs/prior Treatment) HPI Comments: Patient presents to the emergency department by ambulance. Patient was just discharged from this emergency department by myself. He reports that when he got home he was unable to get any food and his lites were out of his apartment. He is complaining that we did not feel that his previous ER visit.  Patient is a 49 y.o. male presenting with abdominal pain.  Abdominal Pain   Past Medical History  Diagnosis Date  . Pancreatitis, acute     First episode earlier in 2012, hospitalized again in 02/2012 and 09/2012  . Hypertension     noncompliance  . Hyperlipidemia     noncompliance  . Lung collapse     h/o  . ETOH abuse   . Cocaine abuse   . DTs (delirium tremens)     history of  . Tobacco abuse   . Seizure disorder 07/17/2013  . Noncompliance 07/17/2013  . Traumatic subdural hematoma 06/15/2013  . 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 Open skull fracture 06/15/2013  . Polysubstance abuse     etoh, cocaine  . Seizures   . Drug-seeking behavior   . Diabetes mellitus without complication   . Nephrolithiasis    Past Surgical History  Procedure Laterality Date  . Mandible fracture surgery  2006  . Left axillary      surgery for deep laceration   Family History  Problem Relation Age of Onset  . Diabetes type II Mother   . Liver disease Neg Hx   . Colon cancer Neg Hx   . GI problems Neg Hx    History  Substance Use Topics  . Smoking status: Current Every Day Smoker -- 1.00 packs/day for 20 years    Types: Cigarettes  . Smokeless tobacco: Current User  . Alcohol Use: Yes     Comment: occ    Review of Systems  Constitutional:       Patient is hungry  Gastrointestinal:  Positive for abdominal pain.  All other systems reviewed and are negative.     Allergies  Review of patient's allergies indicates no known allergies.  Home Medications   Prior to Admission medications   Medication Sig Start Date End Date Taking? Authorizing Provider  phenytoin (DILANTIN) 100 MG ER capsule Take 1 capsule (100 mg total) by mouth 3 (three) times daily. Patient not taking: Reported on 06/29/2015 05/14/15   Jerald Kief, MD  potassium chloride (K-DUR) 10 MEQ tablet Take 1 tablet (10 mEq total) by mouth 2 (two) times daily. Patient not taking: Reported on 06/19/2015 04/27/15   Vanetta Mulders, MD   BP 99/71 mmHg  Pulse 94  Temp(Src) 98.2 F (36.8 C) (Oral)  Resp 16  Ht 6' (1.829 m)  Wt 137 lb (62.143 kg)  BMI 18.58 kg/m2  SpO2 97% Physical Exam  Constitutional: He is oriented to person, place, and time. He appears well-developed and well-nourished. No distress.  HENT:  Head: Normocephalic and atraumatic.  Right Ear: Hearing normal.  Left Ear: Hearing normal.  Nose: Nose normal.  Mouth/Throat: Oropharynx is clear and moist and mucous membranes are normal.  Eyes: Conjunctivae and EOM are normal. Pupils are equal, round, and reactive to light.  Neck: Normal range of motion. Neck supple.  Cardiovascular: Regular rhythm, S1 normal and S2 normal.  Exam reveals no gallop and no friction rub.   No murmur heard. Pulmonary/Chest: Effort normal and breath sounds normal. No respiratory distress. He exhibits no tenderness.  Abdominal: Soft. Normal appearance and bowel sounds are normal. There is no hepatosplenomegaly. There is no tenderness. There is no rebound, no guarding, no tenderness at McBurney's point and negative Murphy's sign. No hernia.  Musculoskeletal: Normal range of motion.  Neurological: He is alert and oriented to person, place, and time. He has normal strength. No cranial nerve deficit or sensory deficit. Coordination normal. GCS eye subscore is 4. GCS verbal  subscore is 5. GCS motor subscore is 6.  Skin: Skin is warm, dry and intact. No rash noted. No cyanosis.  Psychiatric: He has a normal mood and affect. His speech is normal and behavior is normal. Thought content normal.  Nursing note and vitals reviewed.   ED Course  Procedures (including critical care time) Labs Review Labs Reviewed - No data to display  Imaging Review No results found.   EKG Interpretation None      MDM   Final diagnoses:  Alcohol intoxication, uncomplicated    Reason just had a workup and was discharged from the ER. He returned within an hour of discharge. His primary goal is to be in air conditioning and to be fed. Patient was told that he does not need any further emergent care, was discharged.    Gilda Creasehristopher J Pollina, MD 06/29/15 [phone]

## 2015-07-18 ENCOUNTER — Ambulatory Visit: Payer: Self-pay | Admitting: Nurse Practitioner

## 2015-07-18 ENCOUNTER — Telehealth: Payer: Self-pay | Admitting: Nurse Practitioner

## 2015-07-18 ENCOUNTER — Encounter: Payer: Self-pay | Admitting: Nurse Practitioner

## 2015-07-18 NOTE — Telephone Encounter (Signed)
PATIENT WAS A NO SHOW AND LETTER SENT  °

## 2015-07-18 NOTE — Telephone Encounter (Signed)
Noted  

## 2015-08-13 ENCOUNTER — Encounter (HOSPITAL_COMMUNITY): Payer: Self-pay | Admitting: Emergency Medicine

## 2015-08-13 ENCOUNTER — Emergency Department (HOSPITAL_COMMUNITY)
Admission: EM | Admit: 2015-08-13 | Discharge: 2015-08-13 | Discharge status: Against Medical Advice | Payer: Medicaid Other | Attending: Emergency Medicine | Admitting: Emergency Medicine

## 2015-08-13 DIAGNOSIS — E119 Type 2 diabetes mellitus without complications: Secondary | ICD-10-CM | POA: Insufficient documentation

## 2015-08-13 DIAGNOSIS — I10 Essential (primary) hypertension: Secondary | ICD-10-CM | POA: Diagnosis not present

## 2015-08-13 DIAGNOSIS — R197 Diarrhea, unspecified: Secondary | ICD-10-CM | POA: Diagnosis present

## 2015-08-13 DIAGNOSIS — Z720 Tobacco use: Secondary | ICD-10-CM | POA: Diagnosis not present

## 2015-08-13 NOTE — ED Notes (Addendum)
Per pt friend, pt has had diarrhea,loss of appetite,confusion, and abdominal pain for last several days. nad noted.

## 2015-08-13 NOTE — ED Notes (Signed)
Entered room to assess pt and start IV, pt states that "I am getting the fuck out of here."  Pt will not allow nurse to assist in any fashion.  States that he does not want to be evaluated and that nobody is going to touch him.

## 2015-08-13 NOTE — ED Notes (Signed)
Pt continue to refuse treatment.

## 2015-08-15 ENCOUNTER — Inpatient Hospital Stay (HOSPITAL_COMMUNITY)
Admission: EM | Admit: 2015-08-15 | Discharge: 2015-08-25 | DRG: 356 | Disposition: A | Discharge status: Hospice | Payer: Medicaid Other | Attending: Internal Medicine | Admitting: Internal Medicine

## 2015-08-15 ENCOUNTER — Encounter (HOSPITAL_COMMUNITY): Payer: Self-pay | Admitting: Emergency Medicine

## 2015-08-15 DIAGNOSIS — Z833 Family history of diabetes mellitus: Secondary | ICD-10-CM | POA: Diagnosis not present

## 2015-08-15 DIAGNOSIS — R197 Diarrhea, unspecified: Secondary | ICD-10-CM | POA: Insufficient documentation

## 2015-08-15 DIAGNOSIS — Z9119 Patient's noncompliance with other medical treatment and regimen: Secondary | ICD-10-CM | POA: Diagnosis present

## 2015-08-15 DIAGNOSIS — F17210 Nicotine dependence, cigarettes, uncomplicated: Secondary | ICD-10-CM | POA: Diagnosis present

## 2015-08-15 DIAGNOSIS — E860 Dehydration: Secondary | ICD-10-CM | POA: Diagnosis present

## 2015-08-15 DIAGNOSIS — G40909 Epilepsy, unspecified, not intractable, without status epilepticus: Secondary | ICD-10-CM | POA: Diagnosis present

## 2015-08-15 DIAGNOSIS — D689 Coagulation defect, unspecified: Secondary | ICD-10-CM | POA: Diagnosis present

## 2015-08-15 DIAGNOSIS — K767 Hepatorenal syndrome: Secondary | ICD-10-CM | POA: Diagnosis present

## 2015-08-15 DIAGNOSIS — I10 Essential (primary) hypertension: Secondary | ICD-10-CM | POA: Diagnosis present

## 2015-08-15 DIAGNOSIS — F1027 Alcohol dependence with alcohol-induced persisting dementia: Secondary | ICD-10-CM | POA: Diagnosis present

## 2015-08-15 DIAGNOSIS — D649 Anemia, unspecified: Secondary | ICD-10-CM | POA: Diagnosis present

## 2015-08-15 DIAGNOSIS — E861 Hypovolemia: Secondary | ICD-10-CM | POA: Diagnosis present

## 2015-08-15 DIAGNOSIS — Z720 Tobacco use: Secondary | ICD-10-CM | POA: Diagnosis present

## 2015-08-15 DIAGNOSIS — R627 Adult failure to thrive: Secondary | ICD-10-CM | POA: Diagnosis present

## 2015-08-15 DIAGNOSIS — E785 Hyperlipidemia, unspecified: Secondary | ICD-10-CM | POA: Diagnosis present

## 2015-08-15 DIAGNOSIS — K7290 Hepatic failure, unspecified without coma: Secondary | ICD-10-CM | POA: Diagnosis present

## 2015-08-15 DIAGNOSIS — E8809 Other disorders of plasma-protein metabolism, not elsewhere classified: Secondary | ICD-10-CM | POA: Diagnosis present

## 2015-08-15 DIAGNOSIS — E43 Unspecified severe protein-calorie malnutrition: Secondary | ICD-10-CM | POA: Diagnosis present

## 2015-08-15 DIAGNOSIS — R188 Other ascites: Secondary | ICD-10-CM

## 2015-08-15 DIAGNOSIS — Z515 Encounter for palliative care: Secondary | ICD-10-CM

## 2015-08-15 DIAGNOSIS — N390 Urinary tract infection, site not specified: Secondary | ICD-10-CM | POA: Diagnosis not present

## 2015-08-15 DIAGNOSIS — Z66 Do not resuscitate: Secondary | ICD-10-CM | POA: Diagnosis not present

## 2015-08-15 DIAGNOSIS — T68XXXA Hypothermia, initial encounter: Secondary | ICD-10-CM | POA: Diagnosis not present

## 2015-08-15 DIAGNOSIS — K7210 Chronic hepatic failure without coma: Secondary | ICD-10-CM | POA: Insufficient documentation

## 2015-08-15 DIAGNOSIS — E876 Hypokalemia: Secondary | ICD-10-CM | POA: Diagnosis present

## 2015-08-15 DIAGNOSIS — F1410 Cocaine abuse, uncomplicated: Secondary | ICD-10-CM | POA: Diagnosis present

## 2015-08-15 DIAGNOSIS — D6489 Other specified anemias: Secondary | ICD-10-CM | POA: Diagnosis present

## 2015-08-15 DIAGNOSIS — K7031 Alcoholic cirrhosis of liver with ascites: Secondary | ICD-10-CM | POA: Diagnosis present

## 2015-08-15 DIAGNOSIS — E118 Type 2 diabetes mellitus with unspecified complications: Secondary | ICD-10-CM | POA: Diagnosis present

## 2015-08-15 DIAGNOSIS — A084 Viral intestinal infection, unspecified: Principal | ICD-10-CM | POA: Diagnosis present

## 2015-08-15 DIAGNOSIS — K7011 Alcoholic hepatitis with ascites: Secondary | ICD-10-CM | POA: Diagnosis present

## 2015-08-15 DIAGNOSIS — Z681 Body mass index (BMI) 19 or less, adult: Secondary | ICD-10-CM | POA: Diagnosis not present

## 2015-08-15 DIAGNOSIS — N170 Acute kidney failure with tubular necrosis: Secondary | ICD-10-CM | POA: Diagnosis present

## 2015-08-15 DIAGNOSIS — K7682 Hepatic encephalopathy: Secondary | ICD-10-CM | POA: Diagnosis not present

## 2015-08-15 DIAGNOSIS — E872 Acidosis, unspecified: Secondary | ICD-10-CM | POA: Diagnosis not present

## 2015-08-15 DIAGNOSIS — A419 Sepsis, unspecified organism: Secondary | ICD-10-CM | POA: Diagnosis not present

## 2015-08-15 DIAGNOSIS — K219 Gastro-esophageal reflux disease without esophagitis: Secondary | ICD-10-CM | POA: Diagnosis present

## 2015-08-15 DIAGNOSIS — D696 Thrombocytopenia, unspecified: Secondary | ICD-10-CM | POA: Diagnosis present

## 2015-08-15 DIAGNOSIS — B952 Enterococcus as the cause of diseases classified elsewhere: Secondary | ICD-10-CM | POA: Diagnosis not present

## 2015-08-15 DIAGNOSIS — D6959 Other secondary thrombocytopenia: Secondary | ICD-10-CM | POA: Diagnosis present

## 2015-08-15 DIAGNOSIS — K861 Other chronic pancreatitis: Secondary | ICD-10-CM | POA: Diagnosis present

## 2015-08-15 DIAGNOSIS — I959 Hypotension, unspecified: Secondary | ICD-10-CM | POA: Diagnosis present

## 2015-08-15 DIAGNOSIS — N179 Acute kidney failure, unspecified: Secondary | ICD-10-CM

## 2015-08-15 DIAGNOSIS — F1010 Alcohol abuse, uncomplicated: Secondary | ICD-10-CM | POA: Diagnosis present

## 2015-08-15 DIAGNOSIS — Z7189 Other specified counseling: Secondary | ICD-10-CM | POA: Diagnosis not present

## 2015-08-15 DIAGNOSIS — R140 Abdominal distension (gaseous): Secondary | ICD-10-CM

## 2015-08-15 DIAGNOSIS — R112 Nausea with vomiting, unspecified: Secondary | ICD-10-CM | POA: Diagnosis present

## 2015-08-15 HISTORY — DX: Acute kidney failure, unspecified: N17.9

## 2015-08-15 HISTORY — DX: Other chronic pancreatitis: K86.1

## 2015-08-15 HISTORY — DX: Alcoholic cirrhosis of liver with ascites: K70.31

## 2015-08-15 HISTORY — DX: Sepsis, unspecified organism: A41.9

## 2015-08-15 HISTORY — DX: Hepatorenal syndrome: K76.7

## 2015-08-15 HISTORY — DX: Hepatic failure, unspecified without coma: K72.90

## 2015-08-15 HISTORY — DX: Alcoholic hepatitis with ascites: K70.11

## 2015-08-15 HISTORY — DX: Alcohol dependence with alcohol-induced persisting dementia: F10.27

## 2015-08-15 LAB — COMPREHENSIVE METABOLIC PANEL
ALK PHOS: 143 U/L — AB (ref 38–126)
ALT: 26 U/L (ref 17–63)
ANION GAP: 10 (ref 5–15)
AST: 49 U/L — AB (ref 15–41)
Albumin: 1.2 g/dL — ABNORMAL LOW (ref 3.5–5.0)
BILIRUBIN TOTAL: 2.7 mg/dL — AB (ref 0.3–1.2)
BUN: 22 mg/dL — ABNORMAL HIGH (ref 6–20)
CO2: 19 mmol/L — ABNORMAL LOW (ref 22–32)
Calcium: 6.1 mg/dL — CL (ref 8.9–10.3)
Chloride: 104 mmol/L (ref 101–111)
Creatinine, Ser: 3.2 mg/dL — ABNORMAL HIGH (ref 0.61–1.24)
GFR calc Af Amer: 25 mL/min — ABNORMAL LOW (ref >60)
GFR calc non Af Amer: 21 mL/min — ABNORMAL LOW (ref >60)
GLUCOSE: 122 mg/dL — AB (ref 65–99)
POTASSIUM: 1.4 mmol/L — AB (ref 3.5–5.1)
Sodium: 133 mmol/L — ABNORMAL LOW (ref 135–145)
TOTAL PROTEIN: 5.2 g/dL — AB (ref 6.5–8.1)

## 2015-08-15 LAB — CBC WITH DIFFERENTIAL/PLATELET
BASOS PCT: 0 % (ref 0–1)
Basophils Absolute: 0 10*3/uL (ref 0.0–0.1)
EOS PCT: 0 % (ref 0–5)
Eosinophils Absolute: 0 10*3/uL (ref 0.0–0.7)
HEMATOCRIT: 25.7 % — AB (ref 39.0–52.0)
HEMOGLOBIN: 8.9 g/dL — AB (ref 13.0–17.0)
LYMPHS PCT: 28 % (ref 12–46)
Lymphs Abs: 2 10*3/uL (ref 0.7–4.0)
MCH: 30.5 pg (ref 26.0–34.0)
MCHC: 34.6 g/dL (ref 30.0–36.0)
MCV: 88 fL (ref 78.0–100.0)
Monocytes Absolute: 0.5 10*3/uL (ref 0.1–1.0)
Monocytes Relative: 7 % (ref 3–12)
NEUTROS PCT: 65 % (ref 43–77)
Neutro Abs: 4.5 10*3/uL (ref 1.7–7.7)
Platelets: 81 10*3/uL — ABNORMAL LOW (ref 150–400)
RBC: 2.92 MIL/uL — ABNORMAL LOW (ref 4.22–5.81)
RDW: 16.6 % — ABNORMAL HIGH (ref 11.5–15.5)
WBC: 7 10*3/uL (ref 4.0–10.5)

## 2015-08-15 LAB — GRAM STAIN

## 2015-08-15 LAB — PHENYTOIN LEVEL, TOTAL: Phenytoin Lvl: <2.5 ug/mL — ABNORMAL LOW (ref 10.0–20.0)

## 2015-08-15 LAB — ALBUMIN, FLUID (OTHER): Albumin, Fluid: <1 g/dL

## 2015-08-15 LAB — BODY FLUID CELL COUNT WITH DIFFERENTIAL
EOS FL: 0 %
LYMPHS FL: 53 %
MONOCYTE-MACROPHAGE-SEROUS FLUID: 13 % — AB (ref 50–90)
Neutrophil Count, Fluid: 34 % — ABNORMAL HIGH (ref 0–25)
Total Nucleated Cell Count, Fluid: 48 cu mm (ref 0–1000)

## 2015-08-15 LAB — PROTEIN, BODY FLUID

## 2015-08-15 LAB — LIPASE, BLOOD: LIPASE: 11 U/L — AB (ref 22–51)

## 2015-08-15 LAB — GLUCOSE, PERITONEAL FLUID: Glucose, Peritoneal Fluid: 133 mg/dL

## 2015-08-15 LAB — MAGNESIUM: Magnesium: 2.2 mg/dL (ref 1.7–2.4)

## 2015-08-15 LAB — LACTATE DEHYDROGENASE, PLEURAL OR PERITONEAL FLUID: LD, Fluid: 84 U/L — ABNORMAL HIGH (ref 3–23)

## 2015-08-15 LAB — AMMONIA: Ammonia: 122 umol/L — ABNORMAL HIGH (ref 9–35)

## 2015-08-15 LAB — ETHANOL: Alcohol, Ethyl (B): <5 mg/dL (ref <5)

## 2015-08-15 MED ORDER — OXYCODONE HCL 5 MG PO TABS
5.0000 mg | ORAL_TABLET | ORAL | Status: DC | PRN
  Administered 2015-08-15: 5 mg via ORAL
  Filled 2015-08-15: qty 1

## 2015-08-15 MED ORDER — PHENYTOIN SODIUM EXTENDED 100 MG PO CAPS
100.0000 mg | ORAL_CAPSULE | Freq: Three times a day (TID) | ORAL | Status: DC
  Administered 2015-08-15: 100 mg via ORAL
  Filled 2015-08-15: qty 1

## 2015-08-15 MED ORDER — SODIUM CHLORIDE 0.9 % IJ SOLN
3.0000 mL | Freq: Two times a day (BID) | INTRAMUSCULAR | Status: DC
  Administered 2015-08-17 – 2015-08-23 (×7): 3 mL via INTRAVENOUS

## 2015-08-15 MED ORDER — ONDANSETRON HCL 4 MG/2ML IJ SOLN: 4.0000 mg | Freq: Three times a day (TID) | INTRAMUSCULAR | Status: DC | PRN

## 2015-08-15 MED ORDER — NICOTINE 14 MG/24HR TD PT24
14.0000 mg | MEDICATED_PATCH | Freq: Every day | TRANSDERMAL | Status: DC
Start: 2015-08-15 — End: 2015-08-25
  Administered 2015-08-15 – 2015-08-25 (×11): 14 mg via TRANSDERMAL
  Filled 2015-08-15 (×11): qty 1

## 2015-08-15 MED ORDER — THIAMINE HCL 100 MG/ML IJ SOLN
Freq: Once | INTRAVENOUS | Status: AC
  Administered 2015-08-15: 18:00:00 via INTRAVENOUS
  Filled 2015-08-15: qty 1000

## 2015-08-15 MED ORDER — POTASSIUM CHLORIDE 10 MEQ/100ML IV SOLN
10.0000 meq | Freq: Once | INTRAVENOUS | Status: AC
  Administered 2015-08-15: 10 meq via INTRAVENOUS
  Filled 2015-08-15: qty 100

## 2015-08-15 MED ORDER — DIAZEPAM 5 MG/ML IJ SOLN
5.0000 mg | INTRAMUSCULAR | Status: DC | PRN
Start: 2015-08-15 — End: 2015-08-24
  Administered 2015-08-17: 5 mg via INTRAVENOUS
  Filled 2015-08-15 (×3): qty 2

## 2015-08-15 MED ORDER — SODIUM CHLORIDE 0.9 % IV BOLUS (SEPSIS)
1000.0000 mL | Freq: Once | INTRAVENOUS | Status: AC
  Administered 2015-08-15: 1000 mL via INTRAVENOUS

## 2015-08-15 MED ORDER — SODIUM CHLORIDE 0.9 % IV SOLN
INTRAVENOUS | Status: AC
  Administered 2015-08-15: 17:00:00 via INTRAVENOUS

## 2015-08-15 MED ORDER — FENTANYL CITRATE (PF) 100 MCG/2ML IJ SOLN
50.0000 ug | Freq: Once | INTRAMUSCULAR | Status: AC
  Administered 2015-08-15: 50 ug via INTRAVENOUS
  Filled 2015-08-15: qty 2

## 2015-08-15 MED ORDER — LIDOCAINE HCL (PF) 1 % IJ SOLN
INTRAMUSCULAR | Status: AC
  Filled 2015-08-15: qty 5

## 2015-08-15 MED ORDER — POTASSIUM CHLORIDE 10 MEQ/100ML IV SOLN
10.0000 meq | INTRAVENOUS | Status: AC
  Administered 2015-08-15 – 2015-08-16 (×10): 10 meq via INTRAVENOUS
  Filled 2015-08-15 (×7): qty 100

## 2015-08-15 MED ORDER — ONDANSETRON HCL 4 MG/2ML IJ SOLN
4.0000 mg | Freq: Once | INTRAMUSCULAR | Status: AC
  Administered 2015-08-15: 4 mg via INTRAVENOUS
  Filled 2015-08-15: qty 2

## 2015-08-15 MED ORDER — MAGNESIUM SULFATE 2 GM/50ML IV SOLN
2.0000 g | INTRAVENOUS | Status: AC
  Administered 2015-08-15: 2 g via INTRAVENOUS
  Filled 2015-08-15: qty 50

## 2015-08-15 NOTE — ED Notes (Signed)
Patient drained 5000 ml from paracentesis. Tubing clamped at this time. 3 - 60 ml syringes removed by Dr Hyacinth Meeker for lab results. Patient tolerated well.

## 2015-08-15 NOTE — ED Notes (Signed)
Seen here Sunday for n/v/d and returning today with same symptoms.  C/o abdominal pain.  Rates pain 10/10.  Did not take any pain medication today.

## 2015-08-15 NOTE — ED Provider Notes (Signed)
CSN: [id number]     Arrival date & time 08/15/15  1113 History  This chart was scribed for Paul Hong, MD by Octavia Heir, ED Scribe. This patient was seen in room APA12/AP[facility]12 and the patient's care was started at 12:05 PM.     Chief Complaint  Patient presents with  . Emesis  . Diarrhea  . Abdominal Pain     The history is provided by the patient. No language interpreter was used.   HPI Comments: Paul [name] is a 49 y.o. male who has a hx of HTN, hyperlipidemia, and severe etoh abuse presents to the Emergency Department complaining of intermittent, gradual worsening n/v/d. Pt has associated heartburn and abdominal pain. Per friend, pt has been throwing up and has not been able to keep food or liquid down including water. Pt notes his stools have been watery but there has been no blood in them. He has not had a drink of alcohol in 2-3 weeks. Pt reports having a hx problems with his gall bladder. He denies any associated fevers.  Past Medical History  Diagnosis Date  . Pancreatitis, acute     First episode earlier in 2012, hospitalized again in 02/2012 and 09/2012  . Hypertension     noncompliance  . Hyperlipidemia     noncompliance  . Lung collapse     h/o  . ETOH abuse   . Cocaine abuse   . DTs (delirium tremens)     history of  . Tobacco abuse   . Seizure disorder 07/17/2013  . Noncompliance 07/17/2013  . Traumatic subdural hematoma 06/15/2013  . 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 Open skull fracture 06/15/2013  . Polysubstance abuse     etoh, cocaine  . Seizures   . Drug-seeking behavior   . Diabetes mellitus without complication   . Nephrolithiasis    Past Surgical History  Procedure Laterality Date  . Mandible fracture surgery  2006  . Left axillary      surgery for deep laceration   Family History  Problem Relation Age of Onset  . Diabetes type II Mother   . Liver disease Neg Hx   . Colon cancer Neg Hx   . GI problems Neg Hx    Social  History  Substance Use Topics  . Smoking status: Current Every Day Smoker -- 1.00 packs/day for 20 years    Types: Cigarettes  . Smokeless tobacco: Current User  . Alcohol Use: Yes     Comment: occ    Review of Systems  All other systems reviewed and are negative.     Allergies  Review of patient's allergies indicates no known allergies.  Home Medications   Prior to Admission medications   Medication Sig Start Date End Date Taking? Authorizing Provider  phenytoin (DILANTIN) 100 MG ER capsule Take 1 capsule (100 mg total) by mouth 3 (three) times daily. 05/14/15  Yes Jerald Kief, MD  potassium chloride (K-DUR) 10 MEQ tablet Take 1 tablet (10 mEq total) by mouth 2 (two) times daily. 04/27/15  Yes Vanetta Mulders, MD   Triage vitals: BP 102/89 mmHg  Pulse 83  Temp(Src)   Resp 16  Ht 6' (1.829 m)  Wt 120 lb (54.432 kg)  BMI 16.27 kg/m2  SpO2 100% Physical Exam  Constitutional: He appears well-developed and well-nourished. No distress.  illl appearing, cachetic  HENT:  Head: Normocephalic and atraumatic.  Mouth/Throat: Oropharynx is clear and moist. No oropharyngeal exudate.  Eyes: EOM are normal. Pupils are  equal, round, and reactive to light. Right eye exhibits no discharge. Left eye exhibits no discharge. Scleral icterus is present.  Neck: Normal range of motion. Neck supple. No JVD present. No thyromegaly present.  Cardiovascular: Normal rate, regular rhythm, normal heart sounds and intact distal pulses.  Exam reveals no gallop and no friction rub.   No murmur heard. Pulmonary/Chest: Effort normal and breath sounds normal. No respiratory distress. He has no wheezes. He has no rales.  Abdominal: Soft. Bowel sounds are normal. He exhibits no distension and no mass. There is no tenderness.  Distended tight abdomen with fluid wave and dullness to percussion   Musculoskeletal: Normal range of motion. He exhibits no edema or tenderness.  Lymphadenopathy:    He has no  cervical adenopathy.  Neurological: He is alert. Coordination normal.  Skin: Skin is warm and dry. No rash noted. No erythema.  Psychiatric: He has a normal mood and affect. His behavior is normal.  Nursing note and vitals reviewed.   ED Course  PARACENTESIS Date/Time: 08/15/2015 1:02 PM Performed by: Paul [name] Authorized by: Paul [name] Consent: Verbal consent obtained. Written consent obtained. Risks and benefits: risks, benefits and alternatives were discussed Consent given by: patient Patient understanding: patient states understanding of the procedure being performed Patient consent: the patient's understanding of the procedure matches consent given Procedure consent: procedure consent matches procedure scheduled Relevant documents: relevant documents present and verified Test results: test results available and properly labeled Site marked: the operative site was marked Imaging studies: imaging studies available Required items: required blood products, implants, devices, and special equipment available Patient identity confirmed: verbally with patient Time out: Immediately prior to procedure a "time out" was called to verify the correct patient, procedure, equipment, support staff and site/side marked as required. Initial or subsequent exam: initial Procedure purpose: diagnostic and therapeutic Indications: new onset ascites Anesthesia: local infiltration Local anesthetic: lidocaine 1% without epinephrine Anesthetic total: 4 ml Patient sedated: no Preparation: Patient was prepped and draped in the usual sterile fashion. Needle gauge: 18 Ultrasound guidance: yes Puncture site: left lower quadrant Fluid removed: 5180(ml) Fluid appearance: serous and clear Dressing: 4x4 sterile gauze Patient tolerance: Patient tolerated the procedure well with no immediate complications    DIAGNOSTIC STUDIES: Oxygen Saturation is 100% on RA, normal by my  interpretation.  COORDINATION OF CARE:  12:10 PM Discussed treatment plan with pt at bedside and pt agreed to plan.  Labs Review Labs Reviewed  COMPREHENSIVE METABOLIC PANEL - Abnormal; Notable for the following:    Sodium 133 (*)    Potassium 1.4 (*)    CO2 19 (*)    Glucose, Bld 122 (*)    BUN 22 (*)    Creatinine, Ser 3.20 (*)    Calcium 6.1 (*)    Total Protein 5.2 (*)    Albumin 1.2 (*)    AST 49 (*)    Alkaline Phosphatase 143 (*)    Total Bilirubin 2.7 (*)    GFR calc non Af Amer 21 (*)    GFR calc Af Amer 25 (*)    All other components within normal limits  CBC WITH DIFFERENTIAL/PLATELET - Abnormal; Notable for the following:    RBC 2.92 (*)    Hemoglobin 8.9 (*)    HCT 25.7 (*)    RDW 16.6 (*)    Platelets 81 (*)    All other components within normal limits  LIPASE, BLOOD - Abnormal; Notable for the following:    Lipase 11 (*)  All other components within normal limits  AMMONIA  ETHANOL    Imaging Review No results found.   ED ECG REPORT  I personally interpreted this EKG   Date: 08/15/2015   Rate: 80  Rhythm: sinus arrhythmia  QRS Axis: normal  Intervals: normal  ST/T Wave abnormalities: nonspecific T wave changes  Conduction Disutrbances:none  Narrative Interpretation:   Old EKG Reviewed: none available   MDM   Final diagnoses:  Hypokalemia  Chronic liver failure  Ascites  Hypotension, unspecified hypotension type  Dehydration  Acute renal failure, unspecified acute renal failure type    The patient appears ill, he is weak, cachectic, I suspect his laboratory values will be significantly abnormal, he will need labs, urinalysis, fluids, the EKG does not show ischemia, his abdomen is distended and in need of paracentesis both to sample to fluid as well as 2 beats therapeutic. The patient will be admitted to the hospital, we'll discuss with the hospitalist once the labs have returned.  Labs have resulted showing severe hypokalemia requiring  potassium supplementation, magnesium, he has acute renal failure, there is concern for hepatorenal syndrome given his liver failure, his renal function has significantly decreased from baseline and he is in acute renal failure. Lab work shows a chronic anemia, no significant changes. Platelets are persistently thrombocytopenic.  Paracentesis performed, over 5 L removed, discussed with the hospitalist who will admit to stepdown unit. The patient has multiple significant laboratory abnormalities and will require high level of care, critical care provided.  CRITICAL CARE Performed by: Vida Roller Total critical care time: 35 Critical care time was exclusive of separately billable procedures and treating other patients. Critical care was necessary to treat or prevent imminent or life-threatening deterioration. Critical care was time spent personally by me on the following activities: development of treatment plan with patient and/or surrogate as well as nursing, discussions with consultants, evaluation of patient's response to treatment, examination of patient, obtaining history from patient or surrogate, ordering and performing treatments and interventions, ordering and review of laboratory studies, ordering and review of radiographic studies, pulse oximetry and re-evaluation of patient's condition.   I personally performed the services described in this documentation, which was scribed in my presence. The recorded information has been reviewed and is accurate.   Meds given in ED:  Medications  lidocaine (PF) (XYLOCAINE) 1 % injection (not administered)  potassium chloride 10 mEq in 100 mL IVPB (10 mEq Intravenous New Bag/Given 08/15/15 1457)  magnesium sulfate IVPB 2 g 50 mL (2 g Intravenous New Bag/Given 08/15/15 1457)  sodium chloride 0.9 % bolus 1,000 mL (0 mLs Intravenous Stopped 08/15/15 1323)  ondansetron (ZOFRAN) injection 4 mg (4 mg Intravenous Given 08/15/15 1235)  fentaNYL (SUBLIMAZE)  injection 50 mcg (50 mcg Intravenous Given 08/15/15 1236)    New Prescriptions   No medications on file       Paul Hong, MD 08/15/15 1531

## 2015-08-15 NOTE — H&P (Signed)
[facility] History and Physical  Paul [name] YQM:[id number] DOB: 1966-08-25 DOA: 08/15/2015  Referring physician: Dr Hyacinth Meeker - [facility] PCP: No PCP Per Patient   Chief Complaint: emesis and diarrhea  HPI: Paul [name] is a 49 y.o. male   She presented with a 2 day history of nausea, vomiting, diarrhea. Symptoms are intermittent but getting worse. Denies any fevers, abdominal pain, chest pain, shortness of breath, lower extremity swelling. Emesis 2 which typically includes food products which is nonbloody nonbilious. Diarrhea is described as watery and non-malonic and nonbloody. Symptoms are getting worse. Patient states he's feeling progressively more weak. Patient is not taken anything for her symptoms. States that he continues to drink alcohol and had a plate of wine last night. Patient states that his last seizure was "the other day. "Patient does not take any medications.    Review of Systems:  Patient somewhat belligerent and non-participatory and unwilling to give further review systems as he states that "you all key bothering me please stop."   Past Medical History  Diagnosis Date  . Pancreatitis, acute     First episode earlier in 2012, hospitalized again in 02/2012 and 09/2012  . Hypertension     noncompliance  . Hyperlipidemia     noncompliance  . Lung collapse     h/o  . ETOH abuse   . Cocaine abuse   . DTs (delirium tremens)     history of  . Tobacco abuse   . Seizure disorder 07/17/2013  . Noncompliance 07/17/2013  . Traumatic subdural hematoma 06/15/2013  . 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 Open skull fracture 06/15/2013  . Polysubstance abuse     etoh, cocaine  . Seizures   . Drug-seeking behavior   . Diabetes mellitus without complication   . Nephrolithiasis    Past Surgical History  Procedure Laterality Date  . Mandible fracture surgery  2006  . Left axillary      surgery for deep laceration   Social History:  reports that  he has been smoking Cigarettes.  He has a 20 pack-year smoking history. He uses smokeless tobacco. He reports that he drinks alcohol. He reports that he uses illicit drugs (Cocaine) about 3 times per week.  No Known Allergies  Family History  Problem Relation Age of Onset  . Diabetes type II Mother   . Liver disease Neg Hx   . Colon cancer Neg Hx   . GI problems Neg Hx      Prior to Admission medications   Medication Sig Start Date End Date Taking? Authorizing Provider  phenytoin (DILANTIN) 100 MG ER capsule Take 1 capsule (100 mg total) by mouth 3 (three) times daily. 05/14/15  Yes Jerald Kief, MD  potassium chloride (K-DUR) 10 MEQ tablet Take 1 tablet (10 mEq total) by mouth 2 (two) times daily. 04/27/15  Yes Vanetta Mulders, MD   Physical Exam: Filed Vitals:   08/15/15 1330 08/15/15 1400 08/15/15 1442 08/15/15 1552  BP: 107/85 105/90 103/83   Pulse:   77 95  Temp:    97.3 F (36.3 C)  TempSrc:    Axillary  Resp: 14 13 15 12   Height:      Weight:      SpO2:   100%     Wt Readings from Last 3 Encounters:  08/15/15 54.432 kg (120 lb)  08/13/15 54.432 kg (120 lb)  06/29/15 62.143 kg (137 lb)    General: Ill appearing. Sleeping upon arrival in the room  but easily awakens. Eyes: Mild scleral icterus, EOMI, PERRL ENT: Dry mucous membranes Neck:  no LAD, masses or thyromegaly Cardiovascular:  RRR, no m/r/g. No LE edema. Respiratory:  CTA bilaterally, no w/r/r. Normal respiratory effort. Abdomen: Mild distention, nontender to palpation, hypoactive bowel sounds, left abdominal wall dressing in place for paracentesis which is clean dry and intact. Skin:  no rash or induration seen on limited exam Musculoskeletal:  grossly normal tone BUE/BLE Psychiatric:  grossly normal mood and affect, speech fluent and appropriate Neurologic:  grossly non-focal.          Labs on Admission:  Basic Metabolic Panel:  Recent Labs Lab 08/15/15 1403  NA 133*  K 1.4*  CL 104  CO2 19*    GLUCOSE 122*  BUN 22*  CREATININE 3.20*  CALCIUM 6.1*   Liver Function Tests:  Recent Labs Lab 08/15/15 1403  AST 49*  ALT 26  ALKPHOS 143*  BILITOT 2.7*  PROT 5.2*  ALBUMIN 1.2*    Recent Labs Lab 08/15/15 1403  LIPASE 11*    Recent Labs Lab 08/15/15 1403  AMMONIA 122*   CBC:  Recent Labs Lab 08/15/15 1403  WBC 7.0  NEUTROABS 4.5  HGB 8.9*  HCT 25.7*  MCV 88.0  PLT 81*   Cardiac Enzymes: No results for input(s): CKTOTAL, CKMB, CKMBINDEX, TROPONINI in the last 168 hours.  BNP (last 3 results) No results for input(s): BNP in the last 8760 hours.  ProBNP (last 3 results) No results for input(s): PROBNP in the last 8760 hours.  CBG: No results for input(s): GLUCAP in the last 168 hours.  Radiological Exams on Admission: No results found.    Assessment/Plan Principal Problem:   End stage liver disease Active Problems:   Alcohol abuse   Tobacco abuse   Seizure disorder   Hypokalemia   Thrombocytopenia   Viral gastroenteritis   Dehydration   AKI (acute kidney injury)   Hypotension   Hepatorenal syndrome   Hepatic Cirrhosis: MELD score 31 (use previous INR from 05/2015). Symptoms of generalized weakness and ongoing end-stage liver disease are worsened by acute viral gastroenteritis. Patient states to drink alcohol. Paracentesis in ED of 5.2 L clear yellowish fluid. Low suspicion for SBP as patient without any abdominal pain complaints, normal white count, and afebrile vital signs stable. Patient feeling somewhat better after Paracentesis. - Stepdown - IVF for dehydration and pressure support (banana bag) - Follow-up paracentesis labs - Zofran - PT/OT - Palliative care consult in am   Hypokalemia: 1.7 on admission. Likely secondary to GI loss. Patient also with hypo-magnesium anemia. EKG not in Epic but read by ED physician as rate of 80, sinus arrhythmia nonspecific T-wave changes. 2 g of magnesium given in ED. - Step down - Continue IV  potassium replacement - BMET, magnesium at 23:00  Hypotension: BP in the 90s. Appears to be at baseline after review of previous medical records - IVF, monitor  Acute kidney injury: Creatinine 3.2. Baseline 0.8 Likely accommodation of dehydration and hepatorenal syndrome - IVF - BMP in a.m.  Thrombocytopenia: 81 on admission. Likely from liver disease - CBC in am  EtOH abuse: Chronic and ongoing. Last alcoholic drink every 4 hours ago - CIWA - UDS  Seizures: Patient does not take his Dilantin. States last seizure was several days ago. - Dilantin level - Restart Dilantin  Anemia: Hemoglobin 8.9. Baseline 9.5. Likely from chronic disease especially cirrhosis - CBC in a.m.  Tobacco: 1/2ppd - nicotine  History of DM: not  taking any medications - A1c  Code Status: FULL DVT Prophylaxis: SCD Family Communication: None Disposition Plan: Pending improvement  MERRELL, DAVID Shela Commons, MD Family Medicine [facility] [email or web address] Password [email or web address]

## 2015-08-16 ENCOUNTER — Inpatient Hospital Stay (HOSPITAL_COMMUNITY): Payer: Medicaid Other

## 2015-08-16 DIAGNOSIS — D649 Anemia, unspecified: Secondary | ICD-10-CM | POA: Diagnosis present

## 2015-08-16 DIAGNOSIS — Z7189 Other specified counseling: Secondary | ICD-10-CM

## 2015-08-16 DIAGNOSIS — Z515 Encounter for palliative care: Secondary | ICD-10-CM

## 2015-08-16 DIAGNOSIS — K7011 Alcoholic hepatitis with ascites: Secondary | ICD-10-CM | POA: Diagnosis present

## 2015-08-16 DIAGNOSIS — E8809 Other disorders of plasma-protein metabolism, not elsewhere classified: Secondary | ICD-10-CM | POA: Diagnosis present

## 2015-08-16 DIAGNOSIS — E118 Type 2 diabetes mellitus with unspecified complications: Secondary | ICD-10-CM | POA: Diagnosis present

## 2015-08-16 DIAGNOSIS — E876 Hypokalemia: Secondary | ICD-10-CM

## 2015-08-16 LAB — COMPREHENSIVE METABOLIC PANEL
ALK PHOS: 154 U/L — AB (ref 38–126)
ALT: 28 U/L (ref 17–63)
AST: 49 U/L — AB (ref 15–41)
Albumin: 1.3 g/dL — ABNORMAL LOW (ref 3.5–5.0)
Anion gap: 11 (ref 5–15)
BUN: 21 mg/dL — AB (ref 6–20)
CHLORIDE: 106 mmol/L (ref 101–111)
CO2: 19 mmol/L — AB (ref 22–32)
CREATININE: 3 mg/dL — AB (ref 0.61–1.24)
Calcium: 6.3 mg/dL — CL (ref 8.9–10.3)
GFR calc Af Amer: 27 mL/min — ABNORMAL LOW (ref >60)
GFR, EST NON AFRICAN AMERICAN: 23 mL/min — AB (ref >60)
Glucose, Bld: 151 mg/dL — ABNORMAL HIGH (ref 65–99)
Potassium: <2 mmol/L — CL (ref 3.5–5.1)
Sodium: 136 mmol/L (ref 135–145)
Total Bilirubin: 2.4 mg/dL — ABNORMAL HIGH (ref 0.3–1.2)
Total Protein: 5.3 g/dL — ABNORMAL LOW (ref 6.5–8.1)

## 2015-08-16 LAB — GLUCOSE, CAPILLARY
GLUCOSE-CAPILLARY: 118 mg/dL — AB (ref 65–99)
Glucose-Capillary: 140 mg/dL — ABNORMAL HIGH (ref 65–99)
Glucose-Capillary: 111 mg/dL — ABNORMAL HIGH (ref 65–99)

## 2015-08-16 LAB — MAGNESIUM: Magnesium: 2.3 mg/dL (ref 1.7–2.4)

## 2015-08-16 LAB — CBC
HEMATOCRIT: 28.3 % — AB (ref 39.0–52.0)
HEMOGLOBIN: 10 g/dL — AB (ref 13.0–17.0)
MCH: 30.7 pg (ref 26.0–34.0)
MCHC: 35.3 g/dL (ref 30.0–36.0)
MCV: 86.8 fL (ref 78.0–100.0)
PLATELETS: 90 10*3/uL — AB (ref 150–400)
RBC: 3.26 MIL/uL — AB (ref 4.22–5.81)
RDW: 16.6 % — ABNORMAL HIGH (ref 11.5–15.5)
WBC: 6.7 10*3/uL (ref 4.0–10.5)

## 2015-08-16 LAB — BASIC METABOLIC PANEL
ANION GAP: 10 (ref 5–15)
Anion gap: 9 (ref 5–15)
BUN: 21 mg/dL — ABNORMAL HIGH (ref 6–20)
BUN: 21 mg/dL — AB (ref 6–20)
CALCIUM: 6.2 mg/dL — AB (ref 8.9–10.3)
CHLORIDE: 105 mmol/L (ref 101–111)
CO2: 19 mmol/L — AB (ref 22–32)
CO2: 20 mmol/L — ABNORMAL LOW (ref 22–32)
Calcium: 6.1 mg/dL — CL (ref 8.9–10.3)
Chloride: 106 mmol/L (ref 101–111)
Creatinine, Ser: 2.96 mg/dL — ABNORMAL HIGH (ref 0.61–1.24)
Creatinine, Ser: 2.98 mg/dL — ABNORMAL HIGH (ref 0.61–1.24)
GFR calc Af Amer: 27 mL/min — ABNORMAL LOW (ref >60)
GFR calc Af Amer: 27 mL/min — ABNORMAL LOW (ref >60)
GFR, EST NON AFRICAN AMERICAN: 23 mL/min — AB (ref >60)
GFR, EST NON AFRICAN AMERICAN: 23 mL/min — AB (ref >60)
GLUCOSE: 176 mg/dL — AB (ref 65–99)
GLUCOSE: 131 mg/dL — AB (ref 65–99)
Potassium: <2 mmol/L — CL (ref 3.5–5.1)
Sodium: 134 mmol/L — ABNORMAL LOW (ref 135–145)
Sodium: 135 mmol/L (ref 135–145)

## 2015-08-16 MED ORDER — LACTULOSE 10 GM/15ML PO SOLN
30.0000 g | Freq: Two times a day (BID) | ORAL | Status: DC
  Administered 2015-08-16 – 2015-08-17 (×4): 30 g via ORAL
  Filled 2015-08-16 (×5): qty 60

## 2015-08-16 MED ORDER — POTASSIUM CHLORIDE IN NACL 20-0.9 MEQ/L-% IV SOLN
INTRAVENOUS | Status: DC
  Administered 2015-08-16: 11:00:00 via INTRAVENOUS

## 2015-08-16 MED ORDER — INSULIN ASPART 100 UNIT/ML ~~LOC~~ SOLN
0.0000 [IU] | Freq: Three times a day (TID) | SUBCUTANEOUS | Status: DC
  Administered 2015-08-16: 1 [IU] via SUBCUTANEOUS
  Administered 2015-08-17 – 2015-08-18 (×2): 2 [IU] via SUBCUTANEOUS
  Administered 2015-08-19: 1 [IU] via SUBCUTANEOUS
  Administered 2015-08-19: 2 [IU] via SUBCUTANEOUS
  Administered 2015-08-20: 1 [IU] via SUBCUTANEOUS
  Administered 2015-08-21: 2 [IU] via SUBCUTANEOUS
  Administered 2015-08-21: 1 [IU] via SUBCUTANEOUS
  Administered 2015-08-21: 2 [IU] via SUBCUTANEOUS
  Administered 2015-08-22: 3 [IU] via SUBCUTANEOUS
  Administered 2015-08-22 (×2): 1 [IU] via SUBCUTANEOUS
  Administered 2015-08-23: 3 [IU] via SUBCUTANEOUS

## 2015-08-16 MED ORDER — FOLIC ACID 5 MG/ML IJ SOLN
1.0000 mg | Freq: Every day | INTRAMUSCULAR | Status: DC
  Administered 2015-08-16: 1 mg via INTRAVENOUS
  Filled 2015-08-16 (×3): qty 0.2

## 2015-08-16 MED ORDER — SPIRONOLACTONE 25 MG PO TABS
25.0000 mg | ORAL_TABLET | Freq: Two times a day (BID) | ORAL | Status: DC
  Administered 2015-08-16 – 2015-08-17 (×4): 25 mg via ORAL
  Filled 2015-08-16 (×4): qty 1

## 2015-08-16 MED ORDER — POTASSIUM CHLORIDE IN NACL 40-0.9 MEQ/L-% IV SOLN
INTRAVENOUS | Status: DC
  Administered 2015-08-16 – 2015-08-18 (×4): 125 mL/h via INTRAVENOUS

## 2015-08-16 MED ORDER — ONDANSETRON HCL 4 MG/2ML IJ SOLN
4.0000 mg | Freq: Four times a day (QID) | INTRAMUSCULAR | Status: DC
  Administered 2015-08-16 – 2015-08-25 (×32): 4 mg via INTRAVENOUS
  Filled 2015-08-16 (×34): qty 2

## 2015-08-16 MED ORDER — POTASSIUM CHLORIDE 10 MEQ/100ML IV SOLN
10.0000 meq | INTRAVENOUS | Status: AC
  Administered 2015-08-15 – 2015-08-16 (×5): 10 meq via INTRAVENOUS
  Filled 2015-08-16: qty 100

## 2015-08-16 MED ORDER — MORPHINE SULFATE (PF) 2 MG/ML IV SOLN
2.0000 mg | INTRAVENOUS | Status: DC | PRN
  Administered 2015-08-16 – 2015-08-23 (×7): 2 mg via INTRAVENOUS
  Filled 2015-08-16 (×8): qty 1

## 2015-08-16 MED ORDER — THIAMINE HCL 100 MG/ML IJ SOLN
100.0000 mg | Freq: Every day | INTRAMUSCULAR | Status: DC
  Administered 2015-08-16 – 2015-08-19 (×4): 100 mg via INTRAVENOUS
  Filled 2015-08-16 (×4): qty 2

## 2015-08-16 MED ORDER — PHENYTOIN SODIUM 50 MG/ML IJ SOLN
100.0000 mg | Freq: Two times a day (BID) | INTRAMUSCULAR | Status: DC
  Administered 2015-08-16 – 2015-08-17 (×3): 100 mg via INTRAVENOUS
  Filled 2015-08-16 (×3): qty 2

## 2015-08-16 MED ORDER — PROMETHAZINE HCL 25 MG/ML IJ SOLN: 6.2500 mg | Freq: Four times a day (QID) | INTRAMUSCULAR | Status: DC | PRN

## 2015-08-16 MED ORDER — PROMETHAZINE HCL 25 MG/ML IJ SOLN: 12.5000 mg | Freq: Four times a day (QID) | INTRAMUSCULAR | Status: DC | PRN

## 2015-08-16 MED ORDER — PANTOPRAZOLE SODIUM 40 MG IV SOLR
40.0000 mg | Freq: Two times a day (BID) | INTRAVENOUS | Status: DC
  Administered 2015-08-16 – 2015-08-17 (×4): 40 mg via INTRAVENOUS
  Filled 2015-08-16 (×4): qty 40

## 2015-08-16 NOTE — Consult Note (Signed)
Consultation Note Date: 08/16/2015   Patient Name: Paul [name]  DOB: 07/15/1966  MRN: [id number]  Age / Sex: 49 y.o., male   PCP: No Pcp Per Patient Referring Physician: Elliot Cousin, MD  Reason for Consultation: Establishing goals of care  Palliative Care Assessment and Plan Summary of Established Goals of Care and Medical Treatment Preferences   Clinical Assessment/Narrative: Paul [name] lying in bed today.  He will open his eyes but not make eye contact.  He is able to communicate but is lethargic.  We talk briefly about his health and code status.  He states that at this time he wishes to remain full code.  We talk about HCPOA and he states he wants his friend Lavera Guise to make decisions for him if/when he can not.    Contacts/Participants in Discussion: Primary Decision Maker: Paul [name] at this time,    HCPOA: no  Requests that his friend Lavera Guise be his HCPOA, referral to chaplain made.   Code Status/Advance Care Planning:  FULL code   Psycho-social/Spiritual:   Support System: Unknown at this time.   Desire for further Chaplaincy support:  Coming for HCPOA.   Prognosis: MELD score 31, poor prognosis  Discharge Planning:  Undecided at this time.        Chief Complaint:  emesis and diarrhea History of Present Illness:  Paul [name] presented with a 2 day history of nausea, vomiting, diarrhea. Symptoms are intermittent but getting worse. Denies any fevers, abdominal pain, chest pain, shortness of breath, lower extremity swelling. Emesis 2 which typically includes food products which is nonbloody nonbilious. Diarrhea is described as watery and non-malonic and nonbloody. Symptoms are getting worse. Patient states he's feeling progressively more weak. Patient is not taken anything for her symptoms. States that he continues to drink alcohol and had a plate of wine last night. Patient states that his last seizure was "the  other day. "Patient does not take any medications.  Primary Diagnoses  Present on Admission:  . Hypokalemia . Alcohol abuse . Tobacco abuse . Thrombocytopenia . End stage liver disease . Viral gastroenteritis . Dehydration . AKI (acute kidney injury) . Hypotension . Hepatorenal syndrome . Cocaine abuse . Type 2 diabetes with complication . Normocytic anemia . Hypoalbuminemia . Ascites due to alcoholic hepatitis  Palliative Review of Systems: Declines to participate>  I have reviewed the medical record, interviewed the patient and family, and examined the patient. The following aspects are pertinent.  Past Medical History  Diagnosis Date  . Pancreatitis, acute     First episode earlier in 2012, hospitalized again in 02/2012 and 09/2012  . Hypertension     noncompliance  . Hyperlipidemia     noncompliance  . Lung collapse     h/o  . ETOH abuse   . Cocaine abuse   . DTs (delirium tremens)     history of  . Tobacco abuse   . Seizure disorder 07/17/2013  . Noncompliance 07/17/2013  . Traumatic subdural hematoma 06/15/2013  . 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 Open skull fracture 06/15/2013  . Polysubstance abuse     etoh, cocaine  . Seizures   . Drug-seeking behavior   . Diabetes mellitus without complication   . Nephrolithiasis    Social History   Social History  . Marital Status: Single    Spouse Name: N/A  . Number of Children: 0  . Years of Education: N/A   Occupational History  . unemployed  Social History Main Topics  . Smoking status: Current Every Day Smoker -- 1.00 packs/day for 20 years    Types: Cigarettes  . Smokeless tobacco: Current User  . Alcohol Use: Yes     Comment: occ  . Drug Use: 3.00 per week    Special: Cocaine     Comment: former;pt. refused  pt denies drugs 03/20/15- former  . Sexual Activity: Yes   Other Topics Concern  . None   Social History Narrative   Homeless, years. Stays anywhere can, abandoned  homes/friends/etc.   Family History  Problem Relation Age of Onset  . Diabetes type II Mother   . Liver disease Neg Hx   . Colon cancer Neg Hx   . GI problems Neg Hx    Scheduled Meds: . folic acid  1 mg Intravenous Daily  . insulin aspart  0-9 Units Subcutaneous TID WC  . lactulose  30 g Oral BID  . nicotine  14 mg Transdermal Daily  . ondansetron (ZOFRAN) IV  4 mg Intravenous 4 times per day  . pantoprazole (PROTONIX) IV  40 mg Intravenous Q12H  . phenytoin (DILANTIN) IV  100 mg Intravenous Q12H  . sodium chloride  3 mL Intravenous Q12H  . spironolactone  25 mg Oral BID  . thiamine IV  100 mg Intravenous Daily   Continuous Infusions: . 0.9 % NaCl with KCl 40 mEq / L 125 mL/hr (08/16/15 1340)   PRN Meds:.diazepam, morphine injection, promethazine Medications Prior to Admission:  Prior to Admission medications   Medication Sig Start Date End Date Taking? Authorizing Provider  phenytoin (DILANTIN) 100 MG ER capsule Take 1 capsule (100 mg total) by mouth 3 (three) times daily. 05/14/15  Yes Jerald Kief, MD  potassium chloride (K-DUR) 10 MEQ tablet Take 1 tablet (10 mEq total) by mouth 2 (two) times daily. 04/27/15  Yes Vanetta Mulders, MD   No Known Allergies CBC:    Component Value Date/Time   WBC 6.7 08/16/2015 0426   HGB 10.0* 08/16/2015 0426   HCT 28.3* 08/16/2015 0426   PLT 90* 08/16/2015 0426   MCV 86.8 08/16/2015 0426   NEUTROABS 4.5 08/15/2015 1403   LYMPHSABS 2.0 08/15/2015 1403   MONOABS 0.5 08/15/2015 1403   EOSABS 0.0 08/15/2015 1403   BASOSABS 0.0 08/15/2015 1403   Comprehensive Metabolic Panel:    Component Value Date/Time   NA 135 08/16/2015 0855   K <2.0* 08/16/2015 0855   CL 106 08/16/2015 0855   CO2 20* 08/16/2015 0855   BUN 21* 08/16/2015 0855   CREATININE 2.98* 08/16/2015 0855   GLUCOSE 131* 08/16/2015 0855   CALCIUM 6.2* 08/16/2015 0855   AST 49* 08/16/2015 0426   ALT 28 08/16/2015 0426   ALKPHOS 154* 08/16/2015 0426   BILITOT 2.4*  08/16/2015 0426   PROT 5.3* 08/16/2015 0426   ALBUMIN 1.3* 08/16/2015 0426    Physical Exam: Vital Signs: BP 113/92 mmHg  Pulse 73  Temp(Src) 97.4 F (36.3 C) (Axillary)  Resp 13  Ht 6' (1.829 m)  Wt 59.4 kg (130 lb 15.3 oz)  BMI 17.76 kg/m2  SpO2 100% SpO2: SpO2: 100 % O2 Device: O2 Device: Not Delivered O2 Flow Rate:   Intake/output summary:  Intake/Output Summary (Last 24 hours) at 08/16/15 1527 Last data filed at 08/16/15 1053  Gross per 24 hour  Intake  187.5 ml  Output    200 ml  Net  -12.5 ml   LBM:   Baseline Weight: Weight: 54.432 kg (120  lb) Most recent weight: Weight: 59.4 kg (130 lb 15.3 oz)  Exam Findings:  Deferred today          Palliative Performance Scale: 40%              Additional Data Reviewed: Recent Labs     08/15/15  1403   08/16/15  0426  08/16/15  0855  WBC  7.0   --   6.7   --   HGB  8.9*   --   10.0*   --   PLT  81*   --   90*   --   NA  133*   < >  136  135  BUN  22*   < >  21*  21*  CREATININE  3.20*   < >  3.00*  2.98*   < > = values in this interval not displayed.     Time In: 1335 Time Out: 1300 Time Total: 35 minutes Greater than 50%  of this time was spent counseling and coordinating care related to the above assessment and plan. GOC discussion shared with nursing staff, SW, and CM,  Will discuss with Dr. Chandra Batch on next rounds.   Signed by: Katheran Awe, NP  Katheran Awe, NP  08/16/2015, 3:27 PM  Please contact Palliative Medicine Team phone at [phone] for questions and concerns.

## 2015-08-16 NOTE — Progress Notes (Signed)
Notified Dr Pixie Casino of lab value Potassium Orders received and implemented  No further

## 2015-08-16 NOTE — Progress Notes (Signed)
RN was made aware today by a friend of the patient that he is homeless. Rn was told that he has no access to food or shelter. Pt states that a friend helps take care of him, but since he is only alert to himself and location and needs continuous reminding of why he is here and what is wrong with him, RN is not sure how accurate his information is.

## 2015-08-16 NOTE — Progress Notes (Signed)
PT Cancellation Note  Patient Details Name: FLEET HIGHAM MRN: [id number] DOB: [birth year]   Cancelled Treatment:    Reason Eval/Treat Not Completed: Patient not medically ready. Chart reviewed. Holding PT evaluation at this time due to potassium levels <2.0. Will reattempt at later date/time, once pt is medically stable and safe to perform exertion.    Buccola,Allan C 08/16/2015, 9:42 AM  9:43 AM  Rosamaria Lints, PT, DPT [place] License # [id number]
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
## 2015-08-16 NOTE — Progress Notes (Signed)
Initial Nutrition Assessment  DOCUMENTATION CODES:   Severe malnutrition in context of chronic illness   Underweight  INTERVENTION:  Follow for goals of care and diet advancement if appropriate   NUTRITION DIAGNOSIS:   Malnutrition related to chronic illness as evidenced by severe depletion of body fat, severe depletion of muscle mass, energy intake < or equal to 75% for > or equal to 1 month.   GOAL:   Other (Comment) (pending palliavtive care evaluation)   MONITOR:   Labs, Diet advancement  REASON FOR ASSESSMENT:   Malnutrition Screening Tool    ASSESSMENT: Pt has hx of ETOH, tobacco, cocaine abuse. He has end-stage liver disease and presents with viral gastroenteritis, AKI, dehydration on admission. He is s/p paracentesis removal 5.1 liters ascitic fluid.  He lives with his friend who prepares his meals. He reports pt po intake has been very poor especially  the past month.  Home diet is regular. Pt diet had been advanced to clears but vomited this morning following eating broth and jello.  Pt meets criteria for severe MALNUTRITION in the context of chronic illness (end-stage liver disease and ETOH abuse) as evidenced by severe depletions of muscle and fat and diet hx.    Diet Order:   NPO   Skin:   WDL  Last BM:   8/17-loose mucous consistentcy  Height:   Ht Readings from Last 1 Encounters:  08/15/15 6' (1.829 m)    Weight:   Wt Readings from Last 1 Encounters:  08/15/15 130 lb 15.3 oz (59.4 kg)    Ideal Body Weight:  80.9 kg  BMI:  Body mass index is 17.76 kg/(m^2).   Estimated Nutritional Needs:   Kcal:  8295-6213  Protein:  88-95 gr  Fluid:  1800 ml daily  EDUCATION NEEDS:   Education needs no appropriate at this time  Royann Shivers MS,RD,CSG,LDN Office: #[phone] Pager: [phone]

## 2015-08-16 NOTE — Progress Notes (Signed)
[facility] PROGRESS NOTE  Paul [name]:[id number] DOB: [birth year] DOA: 08/15/2015 PCP: No PCP Per Patient    Code Status: Full code Family Communication: Discussed with patient; family not available. Disposition Plan: Discharge when clinically appropriate   Consultants:  None  Procedures:  None  Antibiotics:  None  HPI/Subjective: Patient complains of diffuse abdominal pain, mostly in epigastrium. He has nausea and 2 episodes of. He vomiting had a semi-loose stool early this morning. He is unclear about urinary hesitancy or retention. Nursing reports no visible urine output.  Objective: Filed Vitals:   08/16/15 0800  BP:   Pulse: 70  Temp:   Resp: 25   temperature 98.2. Pulse 70. Respiratory rate 25. Blood pressure 101/84. Oxygen saturation 100%.  Intake/Output Summary (Last 24 hours) at 08/16/15 0948 Last data filed at 08/16/15 8119  Gross per 24 hour  Intake  187.5 ml  Output    150 ml  Net   37.5 ml   Filed Weights   08/15/15 1123 08/15/15 1655  Weight: 54.432 kg (120 lb) 59.4 kg (130 lb 15.3 oz)    Exam:   General:  Ill-appearing 49 year old African-American man in no acute distress.  Cardiovascular: S1, S2, no murmurs rubs or gallops.  Respiratory: Breathing nonlabored. Lungs clear anteriorly.  Abdomen: Positive bowel sounds, taut with mild fluid wave, soft, mildly tender diffusely; no masses palpated.  Musculoskeletal/extremities: No pedal edema. No acute hot red joints.  Psychiatric/  neurologic: The patient is alert and oriented to himself, hospital, and year. No signs of tremulousness. His speech is clear. He wants to be left alone while being questioned and examined. Cranial nerves II through XII are grossly intact.  Data Reviewed: Basic Metabolic Panel:  Recent Labs Lab 08/15/15 1403 08/15/15 2310 08/16/15 0426 08/16/15 0855  NA 133* 134* 136 135  K 1.4* <2.0* <2.0* <2.0*  CL 104 105 106 106  CO2 19* 19* 19* 20*   GLUCOSE 122* 176* 151* 131*  BUN 22* 21* 21* 21*  CREATININE 3.20* 2.96* 3.00* 2.98*  CALCIUM 6.1* 6.1* 6.3* 6.2*  MG  --  2.2 2.3  --    Liver Function Tests:  Recent Labs Lab 08/15/15 1403 08/16/15 0426  AST 49* 49*  ALT 26 28  ALKPHOS 143* 154*  BILITOT 2.7* 2.4*  PROT 5.2* 5.3*  ALBUMIN 1.2* 1.3*    Recent Labs Lab 08/15/15 1403  LIPASE 11*    Recent Labs Lab 08/15/15 1403  AMMONIA 122*   CBC:  Recent Labs Lab 08/15/15 1403 08/16/15 0426  WBC 7.0 6.7  NEUTROABS 4.5  --   HGB 8.9* 10.0*  HCT 25.7* 28.3*  MCV 88.0 86.8  PLT 81* 90*   Cardiac Enzymes: No results for input(s): CKTOTAL, CKMB, CKMBINDEX, TROPONINI in the last 168 hours. BNP (last 3 results) No results for input(s): BNP in the last 8760 hours.  ProBNP (last 3 results) No results for input(s): PROBNP in the last 8760 hours.  CBG: No results for input(s): GLUCAP in the last 168 hours.  Recent Results (from the past 240 hour(s))  Gram stain     Status: None   Collection Time: 08/15/15  1:00 PM  Result Value Ref Range Status   Specimen Description FLUID PERITONEAL COLLECTED BY DOCTOR  Final   Special Requests NONE  Final   Gram Stain   Final    WBC PRESENT,BOTH PMN AND MONONUCLEAR NO ORGANISMS SEEN Performed at [facility]    Report Status 08/15/2015 FINAL  Final     Studies: No results found.  Scheduled Meds: . lactulose  30 g Oral BID  . nicotine  14 mg Transdermal Daily  . phenytoin  100 mg Oral TID  . sodium chloride  3 mL Intravenous Q12H   Continuous Infusions:   Assessment and plan:  Principal Problem:   Viral gastroenteritis Active Problems:   Alcohol abuse   Hypokalemia   End stage liver disease   AKI (acute kidney injury)   Hepatorenal syndrome   Dehydration   Hypotension   Ascites due to alcoholic hepatitis   Cocaine abuse   Tobacco abuse   Seizure disorder   Thrombocytopenia   Type 2 diabetes with complication   Normocytic anemia    Hypoalbuminemia   1. Nausea, vomiting, and reported diarrhea; abdominal pain. Working diagnosis is a viral gastroenteritis, but the patient may have alcoholic gastritis. Of note, the patient underwent a paracentesis in the ED yielding 5.2 L of clear yellowish fluid. The fluid analysis is not consistent with SBP. -We'll continue IV fluid hydration. -We will add IV Protonix every 12 hours. -We will make the patient nothing by mouth, except for ice chips and sips. Will add Zofran every 6 hours scheduled and when necessary IV Phenergan. -We'll order a C. difficile PCR.  Severe hypokalemia. Etiology may be secondary to reported diarrhea, but the patient received a total of 11 runs of IV potassium (which was confirmed by nursing) and his serum potassium has barely improved. His magnesium level is within normal limits. -Given his acute renal failure, there is caution with regards to giving him more IV runs of potassium, but will add potassium to his maintenance IV fluids. -We will start Aldactone; if he can tolerate a by mouth.  -Will order urine potassium, sodium, creatinine, chloride levels.  -Will consult nephrology.  Acute kidney injury. The patient's creatinine during the previous hospitalization was within normal limits at 0.84. On admission, it was 3.2. This is presumed to be secondary to prerenal azotemia, query hepatorenal syndrome. -We'll restart IV fluids with potassium chloride added. -We'll insert a Foley catheter. -We'll order renal ultrasound. -We'll consult nephrology.   Probable end-stage liver disease with alcoholic hepatitis/cirrhosis/ascites/hyperbilirubinemia. Patient has a history of multiple hospitalizations for alcoholic hepatitis. He continues to drink despite many conversations with the patient over the years by the hospitalist. Patient reports no intention of stopping. He is wholly noncompliant. -Query hepatorenal syndrome. -His ammonia level is elevated, but he is  still relatively alert and oriented. Lactulose by mouth has been ordered if he can tolerate it. If not, will order lactulose enema. -Status post paracentesis yielding 5.2 L of yellowish fluid. Analysis reveals a WBC of 48. Fluid culture pending. -Consider inpatient GI consult.  Severe hypoalbuminemia causing hypocalcemia. -Patient severe hypoalbuminemia secondary to alcoholic liver disease.  Chronic normocytic anemia and thrombocytopenia, likely secondary to alcoholic liver disease/cirrhosis. -We will check a vitamin B12 level and TSH; ferritin and total iron for further evaluation.  Type 2 diabetes mellitus. Patient has been noncompliant with therapy. His CBGs have been reasonable, ranging from 120-170. -We'll cautiously start sliding scale NovoLog. Will order hemoglobin A1c.  Alcohol and cocaine abuse; tobacco abuse. Patient has been counseled multiple times during previous hospitalizations. He appears to have no intention of stopping. He has a history of leaving AMA during most of not all of the previous hospitalizations. -Nevertheless, he was advised and encouraged to stop all 3. He does not appear to be in alcohol withdrawal yet. -We'll continue  diazepam via the alcohol withdrawal protocol. -We'll add IV thiamine and folate.  Seizure disorder. The patient has been prescribed Dilantin, but he has been noncompliant. We'll continue Dilantin ordered, but will give IV given his marginal intake and nausea/vomiting.  Time spent: 45 minutes, critical care time.     [facility]  [facility] Pager [phone]. If 7PM-7AM, please contact night-coverage at [email or web address], password [facility] 08/16/2015, 9:48 AM  LOS: 1 day

## 2015-08-16 NOTE — Progress Notes (Signed)
Patient is partially compliant with care. Intolerant of the BP cuff Has not been aggressive but has stated that " I'm not that sick" and was apprised of his condition. Intermittent confusion but re-orients quickly. Has had no tremors, agitation, tachycardia, or aggression. No further.

## 2015-08-17 ENCOUNTER — Inpatient Hospital Stay (HOSPITAL_COMMUNITY): Payer: Medicaid Other

## 2015-08-17 ENCOUNTER — Encounter (HOSPITAL_COMMUNITY): Payer: Self-pay

## 2015-08-17 DIAGNOSIS — E43 Unspecified severe protein-calorie malnutrition: Secondary | ICD-10-CM | POA: Diagnosis present

## 2015-08-17 DIAGNOSIS — K7290 Hepatic failure, unspecified without coma: Secondary | ICD-10-CM

## 2015-08-17 DIAGNOSIS — K7682 Hepatic encephalopathy: Secondary | ICD-10-CM

## 2015-08-17 DIAGNOSIS — K7210 Chronic hepatic failure without coma: Secondary | ICD-10-CM

## 2015-08-17 HISTORY — DX: Hepatic encephalopathy: K76.82

## 2015-08-17 HISTORY — DX: Hepatic failure, unspecified without coma: K72.90

## 2015-08-17 LAB — BASIC METABOLIC PANEL
ANION GAP: 6 (ref 5–15)
Anion gap: 10 (ref 5–15)
BUN: 20 mg/dL (ref 6–20)
BUN: UNDETERMINED mg/dL (ref 6–20)
BUN: 21 mg/dL — AB (ref 6–20)
CALCIUM: 6.5 mg/dL — AB (ref 8.9–10.3)
CALCIUM: UNDETERMINED mg/dL (ref 8.9–10.3)
CHLORIDE: UNDETERMINED mmol/L (ref 101–111)
CO2: 20 mmol/L — ABNORMAL LOW (ref 22–32)
CO2: UNDETERMINED mmol/L (ref 22–32)
CO2: 13 mmol/L — ABNORMAL LOW (ref 22–32)
CREATININE: 2.79 mg/dL — AB (ref 0.61–1.24)
CREATININE: UNDETERMINED mg/dL (ref 0.61–1.24)
Calcium: 6.5 mg/dL — ABNORMAL LOW (ref 8.9–10.3)
Chloride: 112 mmol/L — ABNORMAL HIGH (ref 101–111)
Chloride: 111 mmol/L (ref 101–111)
Creatinine, Ser: 3.06 mg/dL — ABNORMAL HIGH (ref 0.61–1.24)
GFR, EST AFRICAN AMERICAN: 29 mL/min — AB (ref >60)
GFR, EST AFRICAN AMERICAN: 26 mL/min — AB (ref >60)
GFR, EST NON AFRICAN AMERICAN: 25 mL/min — AB (ref >60)
GFR, EST NON AFRICAN AMERICAN: 22 mL/min — AB (ref >60)
GLUCOSE: 123 mg/dL — AB (ref 65–99)
GLUCOSE: UNDETERMINED mg/dL (ref 65–99)
Glucose, Bld: 164 mg/dL — ABNORMAL HIGH (ref 65–99)
POTASSIUM: 3.1 mmol/L — AB (ref 3.5–5.1)
Potassium: 2 mmol/L — CL (ref 3.5–5.1)
Potassium: UNDETERMINED mmol/L (ref 3.5–5.1)
SODIUM: 134 mmol/L — AB (ref 135–145)
Sodium: 138 mmol/L (ref 135–145)
Sodium: UNDETERMINED mmol/L (ref 135–145)

## 2015-08-17 LAB — HEPATIC FUNCTION PANEL
ALBUMIN: 1.2 g/dL — AB (ref 3.5–5.0)
ALK PHOS: 143 U/L — AB (ref 38–126)
ALT: 32 U/L (ref 17–63)
AST: 45 U/L — ABNORMAL HIGH (ref 15–41)
BILIRUBIN INDIRECT: 1.1 mg/dL — AB (ref 0.3–0.9)
Bilirubin, Direct: 1 mg/dL — ABNORMAL HIGH (ref 0.1–0.5)
TOTAL PROTEIN: 4.9 g/dL — AB (ref 6.5–8.1)
Total Bilirubin: 2.1 mg/dL — ABNORMAL HIGH (ref 0.3–1.2)

## 2015-08-17 LAB — CBC
HCT: 28.4 % — ABNORMAL LOW (ref 39.0–52.0)
HEMOGLOBIN: 9.6 g/dL — AB (ref 13.0–17.0)
MCH: 30.3 pg (ref 26.0–34.0)
MCHC: 33.8 g/dL (ref 30.0–36.0)
MCV: 89.6 fL (ref 78.0–100.0)
PLATELETS: 84 10*3/uL — AB (ref 150–400)
RBC: 3.17 MIL/uL — ABNORMAL LOW (ref 4.22–5.81)
RDW: 16.4 % — ABNORMAL HIGH (ref 11.5–15.5)
WBC: 6.6 10*3/uL (ref 4.0–10.5)

## 2015-08-17 LAB — C DIFFICILE QUICK SCREEN W PCR REFLEX
C DIFFICLE (CDIFF) ANTIGEN: NEGATIVE
C Diff interpretation: NEGATIVE
C Diff toxin: NEGATIVE

## 2015-08-17 LAB — GLUCOSE, CAPILLARY
GLUCOSE-CAPILLARY: 108 mg/dL — AB (ref 65–99)
GLUCOSE-CAPILLARY: 169 mg/dL — AB (ref 65–99)
GLUCOSE-CAPILLARY: 87 mg/dL (ref 65–99)
Glucose-Capillary: 119 mg/dL — ABNORMAL HIGH (ref 65–99)

## 2015-08-17 LAB — MRSA PCR SCREENING: MRSA BY PCR: POSITIVE — AB

## 2015-08-17 LAB — POTASSIUM: Potassium: 2.3 mmol/L — CL (ref 3.5–5.1)

## 2015-08-17 LAB — PATHOLOGIST SMEAR REVIEW

## 2015-08-17 LAB — TSH: TSH: 7.202 u[IU]/mL — AB (ref 0.350–4.500)

## 2015-08-17 LAB — VITAMIN B12: VITAMIN B 12: 1621 pg/mL — AB (ref 180–914)

## 2015-08-17 LAB — AMMONIA: Ammonia: 62 umol/L — ABNORMAL HIGH (ref 9–35)

## 2015-08-17 LAB — FERRITIN: FERRITIN: 516 ng/mL — AB (ref 24–336)

## 2015-08-17 MED ORDER — MUPIROCIN 2 % EX OINT
1.0000 "application " | TOPICAL_OINTMENT | Freq: Two times a day (BID) | CUTANEOUS | Status: AC
  Administered 2015-08-18 – 2015-08-22 (×10): 1 via NASAL
  Filled 2015-08-17 (×2): qty 22

## 2015-08-17 MED ORDER — POTASSIUM CHLORIDE CRYS ER 20 MEQ PO TBCR
40.0000 meq | EXTENDED_RELEASE_TABLET | Freq: Two times a day (BID) | ORAL | Status: DC
  Administered 2015-08-17 (×2): 40 meq via ORAL
  Filled 2015-08-17 (×2): qty 2

## 2015-08-17 MED ORDER — CHLORHEXIDINE GLUCONATE CLOTH 2 % EX PADS
6.0000 | MEDICATED_PAD | Freq: Every day | CUTANEOUS | Status: AC
  Administered 2015-08-18 – 2015-08-22 (×5): 6 via TOPICAL

## 2015-08-17 MED ORDER — SODIUM CHLORIDE 0.9 % IJ SOLN
10.0000 mL | Freq: Two times a day (BID) | INTRAMUSCULAR | Status: DC
  Administered 2015-08-17: 40 mL
  Administered 2015-08-17 – 2015-08-25 (×12): 10 mL

## 2015-08-17 MED ORDER — PHENYTOIN SODIUM 50 MG/ML IJ SOLN
50.0000 mg | Freq: Two times a day (BID) | INTRAMUSCULAR | Status: DC
  Administered 2015-08-17: 50 mg via INTRAVENOUS
  Filled 2015-08-17: qty 2

## 2015-08-17 MED ORDER — SODIUM CHLORIDE 0.9 % IJ SOLN: 10.0000 mL | INTRAMUSCULAR | Status: DC | PRN

## 2015-08-17 MED ORDER — POTASSIUM CHLORIDE 10 MEQ/100ML IV SOLN
INTRAVENOUS | Status: AC
  Filled 2015-08-17: qty 100

## 2015-08-17 MED ORDER — POTASSIUM CHLORIDE 10 MEQ/100ML IV SOLN
10.0000 meq | INTRAVENOUS | Status: AC
  Administered 2015-08-17 (×4): 10 meq via INTRAVENOUS
  Filled 2015-08-17 (×4): qty 100
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
## 2015-08-17 NOTE — Clinical Social Work Note (Signed)
Clinical Social Work Assessment  Patient Details  Name: Paul [name] MRN: [id number] Date of Birth: 1966-06-06  Date of referral:  08/17/15               Reason for consult:  Substance Use/ETOH Abuse                Permission sought to share information with:    Permission granted to share information::  Yes, Verbal Permission Granted (Patient's roommate, Tammi Klippel.)  Name::        Agency::   ([facility] to make SA referral)  Relationship::     Contact Information:     Housing/Transportation Living arrangements for the past 2 months:  [place] of Information:  Patient Medical sales representative) Patient Interpreter Needed:  None Criminal Activity/Legal Involvement Pertinent to Current Situation/Hospitalization:  No - Comment as needed Significant Relationships:   Fritz Pickerel and [employer], roommates) Lives with:  Roommate Do you feel safe going back to the place where you live?  Yes Need for family participation in patient care:  Yes (Comment)  Care giving concerns:  Patient did not identify issues with care giving.    Social Worker assessment / plan:  CSW met with patient whose roommate and best friend, Cathie Hoops, was at bedside.  Patient stated that he is unemployed, has no PCP nor health insurance.  He stated that he ambulates unassisted and does not require assistance with ADLs.  Patient denied homelessness and advised that he had been living with Mr. And Mrs. Sharyn Lull for the past three years.  Patient indicated that he has no family support.  CSW discussed patient's SA history.  Patient stated that he was interested in receiving SA treatment on an out patient basis.  CSW provided patient with resources.  CSW offered to schedule patient a SA appointment.  Patient and roommate indicated that patient wanted to be seen at [facility]. Patient stated that he had SA treatment in the past but could not remember when nor where. Mr. Sharyn Lull advised that  he would be willing to provide transportation to patient to treatment as long as it was before 2:00pm which was when he had to be at work.  CSW contacted [facility] and scheduled a hosptial discharge appointment for patient for a SA assessment for 08/28/15 between the hours of 8:00 a.m.-3:00 p.m. Patient and Mr. Sharyn Lull requested that CSW leave a message for Mr. Sharyn Lull indicating the SA appointment time.  CSW left a message for Mr. Sharyn Lull with details of the appointment.  CSW signing off.   Employment status:  Unemployed Forensic scientist:  Self Pay (Medicaid Pending) PT Recommendations:  Not assessed at this time Information / Referral to community resources:  Residential Substance Abuse Treatment Options, Outpatient Substance Abuse Treatment Options  Patient/Family's Response to care:  Patient is agreeable to begin SA treatment.  Patient/Family's Understanding of and Emotional Response to Diagnosis, Current Treatment, and Prognosis:  Patient understands that his SA is worsening his serious and chronic health issues and verbalizes a readiness to address SA issues.   Emotional Assessment Appearance:  Appears older than stated age Attitude/Demeanor/Rapport:   (Cooperative but annoyed) Affect (typically observed):  Calm Orientation:  Oriented to Self, Oriented to Place, Oriented to Situation Alcohol / Substance use:  Illicit Drugs, Alcohol Use, Tobacco Use Psych involvement (Current and /or in the community):  No (Comment)  Discharge Needs  Concerns to be addressed:  Substance Abuse Concerns Readmission within the last 30 days:  No Current  discharge risk:  Substance Abuse Barriers to Discharge:  Active Substance Use   Ihor Gully, LCSW 08/17/2015, 1:32 PM [phone]

## 2015-08-17 NOTE — Care Management Note (Signed)
Case Management Note  Patient Details  Name: KERRICK MILER MRN: [id number] Date of Birth: 20-Nov-1966  Subjective/Objective:                  Pt admitted from home with liver failure. Pt lives with a friend and his wife and will return back home with them at discharge. Pt had been independent with ADL's. Pt has no insurance or PCP.  Action/Plan: Financial counselor is aware of self pay status. Will need to arrange follow up with [facility] at discharge. Will continue to follow for discharge planning needs.  Expected Discharge Date:                  Expected Discharge Plan:  Home/Self Care  In-House Referral:  Clinical Social Work, Museum/gallery exhibitions officer  CM Consult  Post Acute Care Choice:  NA Choice offered to:  NA  DME Arranged:    DME Agency:     HH Arranged:    HH Agency:     Status of Service:  In process, will continue to follow  Medicare Important Message Given:    Date Medicare IM Given:    Medicare IM give by:    Date Additional Medicare IM Given:    Additional Medicare Important Message give by:     If discussed at Long Length of Stay Meetings, dates discussed:    Additional Comments:  Cheryl Flash, RN 08/17/2015, 4:24 PM

## 2015-08-17 NOTE — Progress Notes (Signed)
PT Cancellation Note  Patient Details Name: Paul [name] MRN: [id number] DOB: [birth year]   Cancelled Treatment:    Reason Eval/Treat Not Completed: Fatigue/lethargy limiting ability to participate.  Pt is able to speak a minimal amount and appears to understand.  He requests that I return tomorrow to see him as he doesn't feel well enough to do anything today.   [name] 08/17/2015, 2:58 PM

## 2015-08-17 NOTE — Progress Notes (Addendum)
Subjective: Patient denies any difficulty in breathing. He has some weakness. He doesn't have any nausea or vomiting.   Objective: Vital signs in last 24 hours: Temp:  [97 F (36.1 C)-98 F (36.7 C)] 98 F (36.7 C) (08/18 0400) Pulse Rate:  [73-81] 74 (08/17 1949) Resp:  [8-25] 13 (08/18 0500) BP: (91-130)/(74-101) 108/88 mmHg (08/18 0500) SpO2:  [93 %-100 %] 100 % (08/17 1200) Weight:  [139 lb 5.3 oz (63.2 kg)] 139 lb 5.3 oz (63.2 kg) (08/18 0500)  Intake/Output from previous day: 08/17 0701 - 08/18 0700 In: 1464.6 [I.V.:1464.6] Out: 400 [Urine:400] Intake/Output this shift:     Recent Labs  08/15/15 1403 08/16/15 0426 08/17/15 0348  HGB 8.9* 10.0* 9.6*    Recent Labs  08/16/15 0426 08/17/15 0348  WBC 6.7 6.6  RBC 3.26* 3.17*  HCT 28.3* 28.4*  PLT 90* 84*    Recent Labs  08/16/15 0855 08/17/15 0348  NA 135 138  K <2.0* 2.0*  CL 106 112*  CO2 20* 20*  BUN 21* 20  CREATININE 2.98* 2.79*  GLUCOSE 131* 123*  CALCIUM 6.2* 6.5*   No results for input(s): LABPT, INR in the last 72 hours.  Generally patient is emaciated with dry oral mucosa. Chest is clear to auscultation His heart exam regular rate and rhythm Abdomen is distended, nontender Extremities trace edema  Assessment/Plan: Problem #1 acute kidney injury: Most likely prerenal syndrome versus ATN. Presently patient renal function very slowly seems to be improving. Problem #2 hypokalemia: His potassium still very low. Patient presently on potassium supplements with IV. However because of access problem patient was not able to get his IV fluid last night. Problem #3 anemia: His hemoglobin is low Problem #4 polysubstance abuse: Cocaine and alcohol Problem #5 gastroenteritis: Presently seems to be improving Problem #6 alcoholic liver disease: Patient presently with ascites Problem #7 protein calorie malnutrition Problem #8 hypocalcemia: Corrected for albumen of 1.3 his calciums with within reasonable  range. Plan: We'll continue his IV potassium supplement 2] will start patient on KCl 40 mEq  by mouth twice a day 3] we'll check his basic metabolic panel in the morning.Marland Kitchen   Wilkins Elpers S 08/17/2015, 8:33 AM

## 2015-08-17 NOTE — Progress Notes (Signed)
[facility] PROGRESS NOTE  Paul [name] JYN:[id number] DOB: [birth year] DOA: 08/15/2015 PCP: No PCP Per Patient    Code Status: Full code Family Communication: Discussed with patient; family not available. Disposition Plan: Discharge when clinically appropriate   Consultants:  Nephrology  Palliative care  Procedures:  PICC pending  In the ED, 8/16-paracentesis yielding 5.2 L.  Antibiotics:  None  HPI/Subjective: Patient is more groggy and has some confusion this morning. He does not complain of abdominal pain, shortness of breath, nausea, or vomiting. Nursing reports that he has been keeping down his oral medications. Nursing reports poor IV access.  Objective: Filed Vitals:   08/17/15 0500  BP: 108/88  Pulse:   Temp:   Resp: 13   temperature 90.8. Pulse 74. Respiratory rate 13. Blood pressure 108/88. Oxygen saturation 100% on room air.  Intake/Output Summary (Last 24 hours) at 08/17/15 1003 Last data filed at 08/17/15 0500  Gross per 24 hour  Intake 1464.59 ml  Output    250 ml  Net 1214.59 ml   Filed Weights   08/15/15 1123 08/15/15 1655 08/17/15 0500  Weight: 54.432 kg (120 lb) 59.4 kg (130 lb 15.3 oz) 63.2 kg (139 lb 5.3 oz)    Exam:   General:  Ill-appearing 49 year old African-American man in no acute distress.  Cardiovascular: S1, S2, no murmurs rubs or gallops.  Respiratory: Breathing nonlabored. Lungs clear anteriorly.  Abdomen: Positive bowel sounds, taut with moderate ascites, mildly tender diffusely; no masses palpated.  Musculoskeletal/extremities: No pedal edema. No acute hot red joints.  Psychiatric/  neurologic: The patient is more lethargic, but he is  oriented to himself, hospital. He does have asterixis. No signs of tremulousness.    Data Reviewed: Basic Metabolic Panel:  Recent Labs Lab 08/15/15 1403 08/15/15 2310 08/16/15 0426 08/16/15 0855 08/17/15 0348  NA 133* 134* 136 135 138  K 1.4* <2.0* <2.0* <2.0* 2.0*   CL 104 105 106 106 112*  CO2 19* 19* 19* 20* 20*  GLUCOSE 122* 176* 151* 131* 123*  BUN 22* 21* 21* 21* 20  CREATININE 3.20* 2.96* 3.00* 2.98* 2.79*  CALCIUM 6.1* 6.1* 6.3* 6.2* 6.5*  MG  --  2.2 2.3  --   --    Liver Function Tests:  Recent Labs Lab 08/15/15 1403 08/16/15 0426 08/17/15 0348  AST 49* 49* 45*  ALT 26 28 32  ALKPHOS 143* 154* 143*  BILITOT 2.7* 2.4* 2.1*  PROT 5.2* 5.3* 4.9*  ALBUMIN 1.2* 1.3* 1.2*    Recent Labs Lab 08/15/15 1403  LIPASE 11*    Recent Labs Lab 08/15/15 1403  AMMONIA 122*   CBC:  Recent Labs Lab 08/15/15 1403 08/16/15 0426 08/17/15 0348  WBC 7.0 6.7 6.6  NEUTROABS 4.5  --   --   HGB 8.9* 10.0* 9.6*  HCT 25.7* 28.3* 28.4*  MCV 88.0 86.8 89.6  PLT 81* 90* 84*   Cardiac Enzymes: No results for input(s): CKTOTAL, CKMB, CKMBINDEX, TROPONINI in the last 168 hours. BNP (last 3 results) No results for input(s): BNP in the last 8760 hours.  ProBNP (last 3 results) No results for input(s): PROBNP in the last 8760 hours.  CBG:  Recent Labs Lab 08/16/15 1104 08/16/15 1646 08/16/15 2000 08/17/15 0747  GLUCAP 140* 118* 111* 108*    Recent Results (from the past 240 hour(s))  Culture, body fluid-bottle     Status: None (Preliminary result)   Collection Time: 08/15/15  1:00 PM  Result Value Ref Range Status  Specimen Description FLUID PERITONEAL COLLECTED BY DOCTOR  Final   Special Requests BOTTLES DRAWN AEROBIC AND ANAEROBIC 10CC  Final   Culture NO GROWTH < 24 HOURS  Final   Report Status PENDING  Incomplete  Gram stain     Status: None   Collection Time: 08/15/15  1:00 PM  Result Value Ref Range Status   Specimen Description FLUID PERITONEAL COLLECTED BY DOCTOR  Final   Special Requests NONE  Final   Gram Stain   Final    WBC PRESENT,BOTH PMN AND MONONUCLEAR NO ORGANISMS SEEN Performed at [facility]    Report Status 08/15/2015 FINAL  Final     Studies: US Renal  08/16/2015   CLINICAL DATA:  Acute  renal failure  EXAM: RENAL ULTRASOUND  COMPARISON:  CT abdomen and pelvis February 19, 2015  FINDINGS: Right Kidney:  Length: 11.3 cm. Echogenicity is mildly increased. Renal cortical thickness within normal limits. No mass, perinephric fluid, or hydronephrosis visualized. No sonographically demonstrable calculus or ureterectasis.  Left Kidney:  Length: 10.2 cm. Echogenicity is mildly increased. Renal cortical thickness within normal limits. No mass, perinephric fluid, or hydronephrosis visualized. No sonographically demonstrable calculus or ureterectasis.  Bladder:  Decompressed with catheter and cannot be evaluated.  There is fairly extensive ascites.  IMPRESSION: Kidneys are mildly echogenic, a finding consistent with medical renal disease. No obstructing focus identified on either side.  Fairly extensive ascites.   Electronically Signed   By: Bretta Bang III M.D.   On: 08/16/2015 13:20    Scheduled Meds: . folic acid  1 mg Intravenous Daily  . insulin aspart  0-9 Units Subcutaneous TID WC  . lactulose  30 g Oral BID  . nicotine  14 mg Transdermal Daily  . ondansetron (ZOFRAN) IV  4 mg Intravenous 4 times per day  . pantoprazole (PROTONIX) IV  40 mg Intravenous Q12H  . phenytoin (DILANTIN) IV  100 mg Intravenous Q12H  . potassium chloride  40 mEq Oral BID  . sodium chloride  3 mL Intravenous Q12H  . spironolactone  25 mg Oral BID  . thiamine IV  100 mg Intravenous Daily   Continuous Infusions: . 0.9 % NaCl with KCl 40 mEq / L 125 mL/hr at 08/17/15 0500    Assessment and plan:  Principal Problem:   Viral gastroenteritis Active Problems:   Alcohol abuse   Hypokalemia   End stage liver disease   AKI (acute kidney injury)   Hepatorenal syndrome   Dehydration   Hypotension   Ascites due to alcoholic hepatitis   Encephalopathy, hepatic   Cocaine abuse   Tobacco abuse   Seizure disorder   Thrombocytopenia   Type 2 diabetes with complication   Normocytic anemia    Hypoalbuminemia   Palliative care encounter   DNR (do not resuscitate) discussion   Protein-calorie malnutrition, severe   1. Nausea, vomiting, and reported diarrhea; abdominal pain. Working diagnosis is a viral gastroenteritis, but the patient may have alcoholic gastritis. Of note, the patient underwent a paracentesis in the ED on 8/16 yielding 5.2 L of clear yellowish fluid. The fluid analysis is not consistent with SBP. -He is less symptomatic. -We'll continue IV fluid hydration. Continue IV Protonix and scheduled Zofran every 6 hours and when necessary IV Phenergan. -Will allow sips of clear liquids as tolerated.   Severe hypokalemia. Etiology may be secondary to reported diarrhea, but the patient received a total of 11 runs of IV potassium following admission (which was confirmed by nursing)  and his serum potassium has barely improved. His magnesium level was within normal limits. -Given his acute renal failure, there was caution with regards to giving him more IV runs of potassium, but  potassium was added to his maintenance IV fluids. Aldactone was started empirically as tolerated. -Nephrology consulted and started oral potassium as tolerated. -We will cautiously give him additional IV runs of potassium today. We'll order a PICC line. -Aldactone started.  - Urine potassium, sodium, creatinine, chloride levels ordered and is pending. -Serum potassium has increased a little. Continue to monitor his potassium level every 8 hours.  Acute kidney injury. The patient's creatinine during the previous hospitalization was within normal limits at 0.84. On admission, it was 3.2. This is presumed to be secondary to prerenal azotemia, query hepatorenal syndrome. -IV fluids were restarted with potassium chloride added. A Foley catheter was inserted for strict ins and outs. -Renal ultrasound was ordered and revealed mildly echogenic kidneys, consistent with medical renal disease. -Nephrology was  consulted and agreed with current management.   Probable end-stage liver disease with alcoholic hepatitis/cirrhosis/ascites/hyperbilirubinemia. -Mild hepatic encephalopathy. Patient has a history of multiple hospitalizations for alcoholic hepatitis. He continues to drink despite many conversations with the patient over the years by the hospitalist. Patient reports no intention of stopping. He is wholly noncompliant. -Query hepatorenal syndrome. -His ammonia level was elevated at 122 on admission. He was started on lactulose. His ammonia level has improved. He is still relatively oriented. -Status post paracentesis in the ED yielding 5.2 L of yellowish fluid. Analysis revealed a WBC of 48. Fluid culture negative to date. He continues to have moderate ascites and will consider another paracentesis in the next 24-48 hours. -Consider inpatient GI consult.  Severe hypoalbuminemia secondary to cirrhosis and severe malnutrition, causing hypocalcemia. -Patient severe hypoalbuminemia secondary to alcoholic liver disease and malnutrition. -Corrected calcium is 8.7.  Chronic normocytic anemia and thrombocytopenia, likely secondary to alcoholic liver disease/cirrhosis. - Vitamin B12 level, ferritin and total iron for further evaluation ordered and are pending.  Type 2 diabetes mellitus. Patient has been noncompliant with therapy. His CBGs have been reasonable, ranging from 120-170. -Sliding scale NovoLog was started. Hemoglobin A1c, pending.  Alcohol and cocaine abuse; tobacco abuse. Patient has been counseled multiple times during previous hospitalizations. He appears to have no intention of stopping. He has a history of leaving AMA during most of not all of the previous hospitalizations. -Palliative care consultation was ordered. Assessment noted and appreciated. -Nevertheless, he was advised and encouraged to stop all 3. -He does not appear to be in alcohol withdrawal yet. -We'll continue diazepam  via the alcohol withdrawal protocol. -We'll continue IV thiamine and folate.  Seizure disorder. The patient had been prescribed Dilantin, but he had been noncompliant. We'll continue Dilantin was ordered and started IV. Will change to by mouth and decreased the dose when tolerated. Seizure disorder is likely secondary to alcohol withdrawal syndrome. He may not need Dilantin indefinitely.  Mildly elevated TSH. Will order free T4.  Time spent: 40 minutes.     [facility]  [facility] Pager [phone]. If 7PM-7AM, please contact night-coverage at [email or web address], password [facility] 08/17/2015, 10:03 AM  LOS: 2 days

## 2015-08-17 NOTE — Progress Notes (Signed)
Peripherally Inserted Central Catheter/Midline Placement  The IV Nurse has discussed with the patient and/or persons authorized to consent for the patient, the purpose of this procedure and the potential benefits and risks involved with this procedure.  The benefits include less needle sticks, lab draws from the catheter and patient may be discharged home with the catheter.  Risks include, but not limited to, infection, bleeding, blood clot (thrombus formation), and puncture of an artery; nerve damage and irregular heat beat.  Alternatives to this procedure were also discussed.  PICC/Midline Placement Documentation  PICC / Midline Double Lumen 08/17/15 PICC Right Basilic 36 cm 0 cm (Active)  Indication for Insertion or Continuance of Line Limited venous access - need for IV therapy >5 days (PICC only) 08/17/2015 11:43 AM  Exposed Catheter (cm) 0 cm 08/17/2015 11:43 AM  Site Assessment Clean;Dry;Intact 08/17/2015 11:43 AM  Lumen #1 Status Flushed;Blood return noted 08/17/2015 11:43 AM  Lumen #2 Status Flushed;Blood return noted 08/17/2015 11:43 AM  Dressing Type Transparent;Securing device 08/17/2015 11:43 AM  Dressing Status Clean;Dry;Intact;Antimicrobial disc in place 08/17/2015 11:43 AM  Line Care Connections checked and tightened 08/17/2015 11:43 AM  Dressing Intervention New dressing 08/17/2015 11:43 AM  Dressing Change Due 08/24/15 08/17/2015 11:43 AM       Paul [name] 08/17/2015, 11:53 AM

## 2015-08-18 ENCOUNTER — Inpatient Hospital Stay (HOSPITAL_COMMUNITY): Payer: Medicaid Other

## 2015-08-18 DIAGNOSIS — E872 Acidosis, unspecified: Secondary | ICD-10-CM | POA: Diagnosis not present

## 2015-08-18 LAB — PROTIME-INR
INR: 1.89 — AB (ref 0.00–1.49)
PROTHROMBIN TIME: 21.6 s — AB (ref 11.6–15.2)

## 2015-08-18 LAB — BASIC METABOLIC PANEL
ANION GAP: 6 (ref 5–15)
Anion gap: 8 (ref 5–15)
Anion gap: 6 (ref 5–15)
BUN: 21 mg/dL — ABNORMAL HIGH (ref 6–20)
BUN: 19 mg/dL (ref 6–20)
BUN: 18 mg/dL (ref 6–20)
CALCIUM: 6.7 mg/dL — AB (ref 8.9–10.3)
CHLORIDE: 116 mmol/L — AB (ref 101–111)
CHLORIDE: 118 mmol/L — AB (ref 101–111)
CO2: 15 mmol/L — ABNORMAL LOW (ref 22–32)
CO2: 13 mmol/L — AB (ref 22–32)
CO2: 13 mmol/L — ABNORMAL LOW (ref 22–32)
CREATININE: 3.06 mg/dL — AB (ref 0.61–1.24)
CREATININE: 3.04 mg/dL — AB (ref 0.61–1.24)
Calcium: 6.7 mg/dL — ABNORMAL LOW (ref 8.9–10.3)
Calcium: 7 mg/dL — ABNORMAL LOW (ref 8.9–10.3)
Chloride: 117 mmol/L — ABNORMAL HIGH (ref 101–111)
Creatinine, Ser: 3.03 mg/dL — ABNORMAL HIGH (ref 0.61–1.24)
GFR calc non Af Amer: 22 mL/min — ABNORMAL LOW (ref >60)
GFR calc non Af Amer: 23 mL/min — ABNORMAL LOW (ref >60)
GFR calc non Af Amer: 23 mL/min — ABNORMAL LOW (ref >60)
GFR, EST AFRICAN AMERICAN: 26 mL/min — AB (ref >60)
GFR, EST AFRICAN AMERICAN: 26 mL/min — AB (ref >60)
GFR, EST AFRICAN AMERICAN: 26 mL/min — AB (ref >60)
GLUCOSE: 176 mg/dL — AB (ref 65–99)
Glucose, Bld: 116 mg/dL — ABNORMAL HIGH (ref 65–99)
Glucose, Bld: 146 mg/dL — ABNORMAL HIGH (ref 65–99)
POTASSIUM: 3 mmol/L — AB (ref 3.5–5.1)
POTASSIUM: 3.9 mmol/L (ref 3.5–5.1)
Potassium: 3.7 mmol/L (ref 3.5–5.1)
SODIUM: 139 mmol/L (ref 135–145)
SODIUM: 137 mmol/L (ref 135–145)
Sodium: 136 mmol/L (ref 135–145)

## 2015-08-18 LAB — CBC
HCT: 23.9 % — ABNORMAL LOW (ref 39.0–52.0)
HEMATOCRIT: 27.3 % — AB (ref 39.0–52.0)
Hemoglobin: 8.1 g/dL — ABNORMAL LOW (ref 13.0–17.0)
Hemoglobin: 9.3 g/dL — ABNORMAL LOW (ref 13.0–17.0)
MCH: 30.6 pg (ref 26.0–34.0)
MCH: 30.3 pg (ref 26.0–34.0)
MCHC: 33.9 g/dL (ref 30.0–36.0)
MCHC: 34.1 g/dL (ref 30.0–36.0)
MCV: 89.5 fL (ref 78.0–100.0)
MCV: 89.8 fL (ref 78.0–100.0)
PLATELETS: 67 10*3/uL — AB (ref 150–400)
PLATELETS: 65 10*3/uL — AB (ref 150–400)
RBC: 3.04 MIL/uL — AB (ref 4.22–5.81)
RBC: 2.67 MIL/uL — AB (ref 4.22–5.81)
RDW: 16.7 % — ABNORMAL HIGH (ref 11.5–15.5)
RDW: 16.7 % — ABNORMAL HIGH (ref 11.5–15.5)
WBC: 7.9 10*3/uL (ref 4.0–10.5)
WBC: 7.4 10*3/uL (ref 4.0–10.5)

## 2015-08-18 LAB — GLUCOSE, CAPILLARY
GLUCOSE-CAPILLARY: 187 mg/dL — AB (ref 65–99)
Glucose-Capillary: 132 mg/dL — ABNORMAL HIGH (ref 65–99)
Glucose-Capillary: 110 mg/dL — ABNORMAL HIGH (ref 65–99)
Glucose-Capillary: 196 mg/dL — ABNORMAL HIGH (ref 65–99)

## 2015-08-18 LAB — LACTIC ACID, PLASMA: Lactic Acid, Venous: 2.5 mmol/L (ref 0.5–2.0)

## 2015-08-18 LAB — T4, FREE: Free T4: 0.94 ng/dL (ref 0.61–1.12)

## 2015-08-18 LAB — HEMOGLOBIN A1C
HEMOGLOBIN A1C: 5.5 % (ref 4.8–5.6)
MEAN PLASMA GLUCOSE: 111 mg/dL

## 2015-08-18 LAB — MAGNESIUM: Magnesium: 2.1 mg/dL (ref 1.7–2.4)

## 2015-08-18 LAB — AMMONIA: Ammonia: 87 umol/L — ABNORMAL HIGH (ref 9–35)

## 2015-08-18 MED ORDER — VANCOMYCIN HCL IN DEXTROSE 1-5 GM/200ML-% IV SOLN
1000.0000 mg | Freq: Once | INTRAVENOUS | Status: AC
  Administered 2015-08-18: 1000 mg via INTRAVENOUS
  Filled 2015-08-18: qty 200

## 2015-08-18 MED ORDER — PHENYTOIN 50 MG PO CHEW
50.0000 mg | CHEWABLE_TABLET | Freq: Two times a day (BID) | ORAL | Status: DC
  Administered 2015-08-18 – 2015-08-20 (×5): 50 mg via ORAL
  Filled 2015-08-18 (×5): qty 1

## 2015-08-18 MED ORDER — SODIUM BICARBONATE 8.4 % IV SOLN
INTRAVENOUS | Status: AC
  Filled 2015-08-18: qty 50

## 2015-08-18 MED ORDER — FUROSEMIDE 10 MG/ML IJ SOLN
60.0000 mg | Freq: Two times a day (BID) | INTRAMUSCULAR | Status: DC
  Administered 2015-08-18 (×2): 60 mg via INTRAVENOUS
  Filled 2015-08-18 (×3): qty 6

## 2015-08-18 MED ORDER — SODIUM BICARBONATE 8.4 % IV SOLN
INTRAVENOUS | Status: DC
  Administered 2015-08-18 – 2015-08-24 (×12): via INTRAVENOUS
  Filled 2015-08-18 (×20): qty 1000

## 2015-08-18 MED ORDER — KCL IN DEXTROSE-NACL 40-5-0.45 MEQ/L-%-% IV SOLN
INTRAVENOUS | Status: AC
  Filled 2015-08-18: qty 1000

## 2015-08-18 MED ORDER — DEXTROSE 5 % IV SOLN
INTRAVENOUS | Status: AC
  Filled 2015-08-18: qty 2

## 2015-08-18 MED ORDER — VANCOMYCIN HCL IN DEXTROSE 1-5 GM/200ML-% IV SOLN
INTRAVENOUS | Status: AC
  Filled 2015-08-18: qty 200

## 2015-08-18 MED ORDER — DEXTROSE 5 % IV SOLN
2.0000 g | INTRAVENOUS | Status: DC
  Administered 2015-08-18 – 2015-08-19 (×2): 2 g via INTRAVENOUS
  Filled 2015-08-18 (×3): qty 2

## 2015-08-18 MED ORDER — VANCOMYCIN HCL IN DEXTROSE 750-5 MG/150ML-% IV SOLN
INTRAVENOUS | Status: AC
  Filled 2015-08-18: qty 150

## 2015-08-18 MED ORDER — PANTOPRAZOLE SODIUM 40 MG PO TBEC
40.0000 mg | DELAYED_RELEASE_TABLET | Freq: Every day | ORAL | Status: DC
  Administered 2015-08-18 – 2015-08-19 (×2): 40 mg via ORAL
  Filled 2015-08-18 (×2): qty 1

## 2015-08-18 MED ORDER — SPIRONOLACTONE 25 MG PO TABS
50.0000 mg | ORAL_TABLET | Freq: Two times a day (BID) | ORAL | Status: DC
  Administered 2015-08-18 – 2015-08-23 (×9): 50 mg via ORAL
  Filled 2015-08-18 (×14): qty 2

## 2015-08-18 MED ORDER — VANCOMYCIN HCL IN DEXTROSE 750-5 MG/150ML-% IV SOLN
750.0000 mg | INTRAVENOUS | Status: DC
  Administered 2015-08-19: 750 mg via INTRAVENOUS
  Filled 2015-08-18 (×2): qty 150

## 2015-08-18 MED ORDER — SODIUM BICARBONATE 650 MG PO TABS
650.0000 mg | ORAL_TABLET | Freq: Three times a day (TID) | ORAL | Status: DC
  Administered 2015-08-18 – 2015-08-21 (×12): 650 mg via ORAL
  Filled 2015-08-18 (×12): qty 1

## 2015-08-18 MED ORDER — POTASSIUM CHLORIDE CRYS ER 20 MEQ PO TBCR
40.0000 meq | EXTENDED_RELEASE_TABLET | Freq: Three times a day (TID) | ORAL | Status: DC
  Administered 2015-08-18 (×3): 40 meq via ORAL
  Filled 2015-08-18 (×4): qty 2

## 2015-08-18 MED ORDER — ALBUMIN HUMAN 25 % IV SOLN
25.0000 g | Freq: Two times a day (BID) | INTRAVENOUS | Status: DC
  Administered 2015-08-18 – 2015-08-19 (×3): 25 g via INTRAVENOUS
  Filled 2015-08-18 (×7): qty 100

## 2015-08-18 MED ORDER — POTASSIUM CHLORIDE 10 MEQ/100ML IV SOLN
10.0000 meq | INTRAVENOUS | Status: AC
  Administered 2015-08-18 (×4): 10 meq via INTRAVENOUS
  Filled 2015-08-18 (×4): qty 100

## 2015-08-18 MED ORDER — FOLIC ACID 1 MG PO TABS
1.0000 mg | ORAL_TABLET | Freq: Every day | ORAL | Status: DC
  Administered 2015-08-18 – 2015-08-23 (×6): 1 mg via ORAL
  Filled 2015-08-18 (×7): qty 1

## 2015-08-18 NOTE — Care Management Note (Signed)
Case Management Note  Patient Details  Name: TSUNEO FAISON MRN: [id number] Date of Birth: 12-Sep-1966  Subjective/Objective:                    Action/Plan:   Expected Discharge Date:                  Expected Discharge Plan:  Home/Self Care  In-House Referral:  Clinical Social Work, Museum/gallery exhibitions officer  CM Consult  Post Acute Care Choice:  NA Choice offered to:  NA  DME Arranged:    DME Agency:     HH Arranged:    HH Agency:     Status of Service:  In process, will continue to follow  Medicare Important Message Given:    Date Medicare IM Given:    Medicare IM give by:    Date Additional Medicare IM Given:    Additional Medicare Important Message give by:     If discussed at Long Length of Stay Meetings, dates discussed:    Additional Comments: Pt now on IV lasix and IV albumin. Anticipate pt in hospital over the weekend. Will continue to follow for discharge planning needs. Arlyss Queen [place], RN 08/18/2015, 1:50 PM

## 2015-08-18 NOTE — Clinical Social Work Note (Signed)
CSW provided patient with appointment information for appointment at [facility].  CSW also added patient's appointment to his discharge follow up sheet in Epic.    Annice Needy, [place] [phone]

## 2015-08-18 NOTE — Progress Notes (Addendum)
[facility] PROGRESS NOTE  Paul [name] WJX:[id number] DOB: 22-Aug-1966 DOA: 08/15/2015 PCP: No PCP Per Patient    Code Status: Full code Family Communication: Discussed with patient; family not available. Disposition Plan: Discharge when clinically appropriate   Consultants:  Nephrology  Palliative care  Procedures:  PICC 8/18  In the ED, 8/16-paracentesis yielding 5.2 L.  Antibiotics:  None  HPI/Subjective: Nursing reports several loose bowel movements overnight. The patient has less abdominal pain. He has some nausea but no vomiting. He reports being hungry and wants to eat.  Objective: Filed Vitals:   08/18/15 0700  BP: 104/77  Pulse: 87  Temp:   Resp:   Temp 98.9. RR 17 02 sat 100%  Intake/Output Summary (Last 24 hours) at 08/18/15 0931 Last data filed at 08/18/15 2130  Gross per 24 hour  Intake   4888 ml  Output    550 ml  Net   4338 ml   Filed Weights   08/15/15 1655 08/17/15 0500 08/18/15 0500  Weight: 59.4 kg (130 lb 15.3 oz) 63.2 kg (139 lb 5.3 oz) 66.3 kg (146 lb 2.6 oz)    Exam:   General:  Ill-appearing 49 year old African-American man in no acute distress.  Cardiovascular: S1, S2, no murmurs rubs or gallops.  Respiratory: Breathing nonlabored. Lungs clear anteriorly.  Abdomen: Positive bowel sounds, softer with moderate ascites, mildly tender diffusely; no masses palpated.  Musculoskeletal/extremities: No pedal edema. No acute hot red joints.  Psychiatric/  neurologic: The patient is less lethargic; he is  oriented to himself, hospital. No signs of tremulousness.    Data Reviewed: Basic Metabolic Panel:  Recent Labs Lab 08/15/15 2310 08/16/15 0426 08/16/15 0855 08/17/15 0348 08/17/15 1309 08/17/15 1720 08/17/15 1838 08/18/15 0223  NA 134* 136 135 138  --  SPECIMEN CONTAMINATED, UNABLE TO PERFORM TEST(S). 134* 139  K <2.0* <2.0* <2.0* 2.0* 2.3* SPECIMEN CONTAMINATED, UNABLE TO PERFORM TEST(S). 3.1* 3.0*  CL 105  106 106 112*  --  SPECIMEN CONTAMINATED, UNABLE TO PERFORM TEST(S). 111 116*  CO2 19* 19* 20* 20*  --  SPECIMEN CONTAMINATED, UNABLE TO PERFORM TEST(S). 13* 15*  GLUCOSE 176* 151* 131* 123*  --  SPECIMEN CONTAMINATED, UNABLE TO PERFORM TEST(S). 164* 116*  BUN 21* 21* 21* 20  --  SPECIMEN CONTAMINATED, UNABLE TO PERFORM TEST(S). 21* 21*  CREATININE 2.96* 3.00* 2.98* 2.79*  --  SPECIMEN CONTAMINATED, UNABLE TO PERFORM TEST(S). 3.06* 3.06*  CALCIUM 6.1* 6.3* 6.2* 6.5*  --  SPECIMEN CONTAMINATED, UNABLE TO PERFORM TEST(S). 6.5* 6.7*  MG 2.2 2.3  --   --   --   --   --  2.1   Liver Function Tests:  Recent Labs Lab 08/15/15 1403 08/16/15 0426 08/17/15 0348  AST 49* 49* 45*  ALT 26 28 32  ALKPHOS 143* 154* 143*  BILITOT 2.7* 2.4* 2.1*  PROT 5.2* 5.3* 4.9*  ALBUMIN 1.2* 1.3* 1.2*    Recent Labs Lab 08/15/15 1403  LIPASE 11*    Recent Labs Lab 08/15/15 1403 08/17/15 0939  AMMONIA 122* 62*   CBC:  Recent Labs Lab 08/15/15 1403 08/16/15 0426 08/17/15 0348 08/18/15 0135 08/18/15 0223  WBC 7.0 6.7 6.6 7.4 7.9  NEUTROABS 4.5  --   --   --   --   HGB 8.9* 10.0* 9.6* 8.1* 9.3*  HCT 25.7* 28.3* 28.4* 23.9* 27.3*  MCV 88.0 86.8 89.6 89.5 89.8  PLT 81* 90* 84* 65* 67*   Cardiac Enzymes: No results for input(s): CKTOTAL, CKMB,  CKMBINDEX, TROPONINI in the last 168 hours. BNP (last 3 results) No results for input(s): BNP in the last 8760 hours.  ProBNP (last 3 results) No results for input(s): PROBNP in the last 8760 hours.  CBG:  Recent Labs Lab 08/17/15 0747 08/17/15 1139 08/17/15 1622 08/17/15 2040 08/18/15 0728  GLUCAP 108* 119* 169* 87 132*    Recent Results (from the past 240 hour(s))  Culture, body fluid-bottle     Status: None (Preliminary result)   Collection Time: 08/15/15  1:00 PM  Result Value Ref Range Status   Specimen Description FLUID PERITONEAL COLLECTED BY DOCTOR  Final   Special Requests BOTTLES DRAWN AEROBIC AND ANAEROBIC 10CC  Final    Culture NO GROWTH 3 DAYS  Final   Report Status PENDING  Incomplete  Gram stain     Status: None   Collection Time: 08/15/15  1:00 PM  Result Value Ref Range Status   Specimen Description FLUID PERITONEAL COLLECTED BY DOCTOR  Final   Special Requests NONE  Final   Gram Stain   Final    WBC PRESENT,BOTH PMN AND MONONUCLEAR NO ORGANISMS SEEN Performed at [facility]    Report Status 08/15/2015 FINAL  Final  C difficile quick scan w PCR reflex     Status: None   Collection Time: 08/17/15  5:25 PM  Result Value Ref Range Status   C Diff antigen NEGATIVE NEGATIVE Final   C Diff toxin NEGATIVE NEGATIVE Final   C Diff interpretation Negative for toxigenic C. difficile  Final  MRSA PCR Screening     Status: Abnormal   Collection Time: 08/17/15  9:30 PM  Result Value Ref Range Status   MRSA by PCR POSITIVE (A) NEGATIVE Final    Comment:        The GeneXpert MRSA Assay (FDA approved for NASAL specimens only), is one component of a comprehensive MRSA colonization surveillance program. It is not intended to diagnose MRSA infection nor to guide or monitor treatment for MRSA infections. RESULT CALLED TO, READ BACK BY AND VERIFIED WITH: HAMMOCK S AT 2332 ON [id number] BY FORSYTH K      Studies: US Renal  08/16/2015   CLINICAL DATA:  Acute renal failure  EXAM: RENAL ULTRASOUND  COMPARISON:  CT abdomen and pelvis February 19, 2015  FINDINGS: Right Kidney:  Length: 11.3 cm. Echogenicity is mildly increased. Renal cortical thickness within normal limits. No mass, perinephric fluid, or hydronephrosis visualized. No sonographically demonstrable calculus or ureterectasis.  Left Kidney:  Length: 10.2 cm. Echogenicity is mildly increased. Renal cortical thickness within normal limits. No mass, perinephric fluid, or hydronephrosis visualized. No sonographically demonstrable calculus or ureterectasis.  Bladder:  Decompressed with catheter and cannot be evaluated.  There is fairly extensive ascites.   IMPRESSION: Kidneys are mildly echogenic, a finding consistent with medical renal disease. No obstructing focus identified on either side.  Fairly extensive ascites.   Electronically Signed   By: Bretta Bang III M.D.   On: 08/16/2015 13:20   Dg Chest Port 1 View  08/17/2015   CLINICAL DATA:  Right PICC line placement.  EXAM: PORTABLE CHEST - 1 VIEW  COMPARISON:  02/17/2015  FINDINGS: The cardiac silhouette, mediastinal and hilar contours are normal. Low lung volumes with vascular crowding and streaky basilar atelectasis. The right PICC line tip is in the distal SVC near the cavoatrial junction.  IMPRESSION: Right PICC line tip in good position in the distal SVC.  Low lung volumes with vascular crowding and bibasilar  atelectasis.   Electronically Signed   By: Rudie Meyer M.D.   On: 08/17/2015 11:49    Scheduled Meds: . albumin human  25 g Intravenous BID  . Chlorhexidine Gluconate Cloth  6 each Topical Q0600  . folic acid  1 mg Intravenous Daily  . furosemide  60 mg Intravenous BID  . insulin aspart  0-9 Units Subcutaneous TID WC  . mupirocin ointment  1 application Nasal BID  . nicotine  14 mg Transdermal Daily  . ondansetron (ZOFRAN) IV  4 mg Intravenous 4 times per day  . pantoprazole (PROTONIX) IV  40 mg Intravenous Q12H  . phenytoin (DILANTIN) IV  50 mg Intravenous Q12H  . potassium chloride  40 mEq Oral TID  . sodium chloride  10-40 mL Intracatheter Q12H  . sodium chloride  3 mL Intravenous Q12H  . spironolactone  50 mg Oral BID  . thiamine IV  100 mg Intravenous Daily   Continuous Infusions: . 0.9 % NaCl with KCl 40 mEq / L 125 mL/hr (08/18/15 0436)    Assessment and plan:  Principal Problem:   Viral gastroenteritis Active Problems:   Alcohol abuse   Hypokalemia   End stage liver disease   AKI (acute kidney injury)   Hepatorenal syndrome   Dehydration   Hypotension   Ascites due to alcoholic hepatitis   Encephalopathy, hepatic   Cocaine abuse   Tobacco abuse    Seizure disorder   Thrombocytopenia   Type 2 diabetes with complication   Normocytic anemia   Hypoalbuminemia   Palliative care encounter   DNR (do not resuscitate) discussion   Protein-calorie malnutrition, severe   1. Nausea, vomiting, and reported diarrhea; abdominal pain. Working diagnosis is a viral gastroenteritis, but the patient may have alcoholic gastritis. Of note, the patient underwent a paracentesis in the ED on 8/16 yielding 5.2 L of clear yellowish fluid. The fluid analysis is not consistent with SBP. -He is less symptomatic, but he continues to have loose stools secondary to lactulose given. C. difficile PCR was negative. -We will discontinue lactulose for now. -We'll continue IV fluid hydration. Will change Protonix to by mouth. Will change his Zofran to sublingual 3 times a day and continue when necessary IV Phenergan. -Will advance diet to full liquids as tolerated and then solid foods as tolerated.  Severe hypokalemia. Etiology may be secondary to reported diarrhea, but the patient received a total of 11 runs of IV potassium following admission (which was confirmed by nursing) and his serum potassium has barely improved. His magnesium level was within normal limits. -Given his acute renal failure, there was caution with regards to giving him more IV runs of potassium, but  potassium was added to his maintenance IV fluids. Aldactone was started empirically as tolerated. -Nephrology consulted and started oral potassium and increased the dose of Aldactone to 50 twice a day. -He was cautiously given another 4 runs of IV potassium on 8/18. give  - Urine potassium, sodium, creatinine, chloride levels ordered and are pending. -His serum potassium has improved. Continue to monitor his potassium level every 8 hours.  Acute kidney injury. The patient's creatinine during the previous hospitalization was within normal limits at 0.84. On admission, it was 3.2. This is presumed to be  secondary to prerenal azotemia, query hepatorenal syndrome. -His urine output has been marginally over 550, past 24 hours. -IV fluids were restarted with potassium chloride added. A Foley catheter was inserted for strict ins and outs. -Renal ultrasound was ordered and  revealed mildly echogenic kidneys, consistent with medical renal disease. -Nephrology was consulted. New orders noted for increasing Aldactone, starting IV Lasix, and starting albumen infusion.  Metabolic acidosis. -Patient CO2 has fallen to 15. Will start sodium bicarbonate orally 3 times a day. Consider adding bicarbonate to the IV fluids. We'll discuss with nephrology as needed.   Probable end-stage liver disease with alcoholic hepatitis/cirrhosis/ascites/hyperbilirubinemia. -Mild hepatic encephalopathy. Patient has a history of multiple hospitalizations for alcoholic hepatitis. He continues to drink despite many conversations with the patient over the years by the hospitalist. Patient reports no intention of stopping. He is wholly noncompliant. -Query hepatorenal syndrome. -His ammonia level was elevated at 122 on admission. He was started on lactulose. His ammonia level has improved. He is still relatively oriented. -Status post paracentesis in the ED yielding 5.2 L of yellowish fluid. Analysis revealed a WBC of 48. Fluid culture negative to date. He continues to have moderate ascites and will consider another paracentesis in the next 24-48 hours. We'll hold off on paracentesis and we'll see if IV Lasix/albumen infusions/Aldactone will decrease ascites. -Consider inpatient GI consult, but the patient has been notoriously noncompliant and continues to abuse alcohol with no intent on stopping.  Severe hypoalbuminemia secondary to cirrhosis and severe malnutrition, causing hypocalcemia. -Patient severe hypoalbuminemia secondary to alcoholic liver disease and malnutrition. -Corrected calcium is 8.7.  Chronic normocytic anemia and  thrombocytopenia, likely secondary to alcoholic liver disease/cirrhosis. - Studies orders for evaluation and revealed ferritin 516, vitamin B12 1621.  Type 2 diabetes mellitus. Patient has been noncompliant with therapy. His CBGs have been reasonable, ranging from 120-170. -Sliding scale NovoLog was started. Hemoglobin A1c was 5.5.  Alcohol and cocaine abuse; tobacco abuse. Patient has been counseled multiple times during previous hospitalizations. He appears to have no intention of stopping. He has a history of leaving AMA during most of not all of the previous hospitalizations. -Palliative care consultation was ordered. Assessment noted and appreciated. -Nevertheless, he was advised and encouraged to stop all 3. -He does not appear to be in alcohol withdrawal yet. -We'll continue diazepam via the alcohol withdrawal protocol. -We'll continue IV thiamine and folate.  Seizure disorder. The patient had been prescribed Dilantin, but he had been noncompliant. We'll continue Dilantin was ordered and started IV. Will change to by mouth and decreased the dose when tolerated. Seizure disorder is likely secondary to alcohol withdrawal syndrome. He may not need Dilantin indefinitely.  Mildly elevated TSH. Will order free T4.  Time spent: 35 minutes.     [facility]  [facility] Pager [phone]. If 7PM-7AM, please contact night-coverage at [email or web address], password [facility] 08/18/2015, 9:31 AM  LOS: 3 days

## 2015-08-18 NOTE — Progress Notes (Signed)
Subjective: Patient offers no complaint  Objective: Vital signs in last 24 hours: Temp:  [96.5 F (35.8 C)-98.9 F (37.2 C)] 98.9 F (37.2 C) (08/19 0400) Pulse Rate:  [31-91] 87 (08/19 0700) Resp:  [11-26] 17 (08/19 0600) BP: (89-130)/(57-96) 104/77 mmHg (08/19 0700) SpO2:  [99 %-100 %] 100 % (08/19 0600) Weight:  [146 lb 2.6 oz (66.3 kg)] 146 lb 2.6 oz (66.3 kg) (08/19 0500)  Intake/Output from previous day: 08/18 0701 - 08/19 0700 In: 4888 [P.O.:720; I.V.:3168; IV Piggyback:1000] Out: 550 [Urine:550] Intake/Output this shift:     Recent Labs  08/15/15 1403 08/16/15 0426 08/17/15 0348 08/18/15 0135 08/18/15 0223  HGB 8.9* 10.0* 9.6* 8.1* 9.3*    Recent Labs  08/18/15 0135 08/18/15 0223  WBC 7.4 7.9  RBC 2.67* 3.04*  HCT 23.9* 27.3*  PLT 65* 67*    Recent Labs  08/17/15 1838 08/18/15 0223  NA 134* 139  K 3.1* 3.0*  CL 111 116*  CO2 13* 15*  BUN 21* 21*  CREATININE 3.06* 3.06*  GLUCOSE 164* 116*  CALCIUM 6.5* 6.7*   No results for input(s): LABPT, INR in the last 72 hours.  Generally :Patient is awake but confused and seems to be disoriented. Speech is not clear Chest is clear to auscultation His heart exam regular rate and rhythm Abdomen is distended, nontender Extremities trace edema  Assessment/Plan: Problem #1 acute kidney injury: Most likely prerenal syndrome versus ATN. Renal function is stable has about 550 cc of urin out put  Problem #2 hypokalemia: His potassium still low but much better Problem #3 anemia: His hemoglobin is low but stable Problem #4 polysubstance abuse: Cocaine and alcohol Problem #5 gastroenteritis: Presently seems to be improving Problem #6 alcoholic liver disease: Patient presently with ascites Problem #7 protein calorie malnutrition Problem #8 hypocalcemia: his calcium is very slowly improving Plan: We'll continue his IV potassium supplement 2] We will increase his po  KCl  To 40 mEq thre times a day 3] Increase  aldactone to 50 mg po once a day 4 albumin 25 gm iv bid x 2 days 5] lasix 60 mg iv bid 6] we'll check his basic metabolic panel in the morning.Marland Kitchen   Korde Jeppsen S 08/18/2015, 8:42 AM

## 2015-08-18 NOTE — Evaluation (Signed)
Physical Therapy Evaluation Patient Details Name: Paul [name] MRN: [id number] DOB: 04/05/1966 Today's Date: 08/18/2015   History of Present Illness  With a hx of ETOH abuse with numerous prior admissions. Pt presents with witnessed seizure activity involving tongue biting. In the ED, pt was found to have an elevated alcohol level of 199. Pt was found to have a subtherapeutic phenytoin level. Initially claimed to be compliant with his phenytoin but when confronted with his subtherapeutic level, pt then claimed to missing only two doses yesterday. Pt was started on phenytoin, CIWA ordered, and hospitalist consulted. Of note, CT head was unremarkable.  Clinical Impression   Pt was seen at bedside for evaluation.  RN states that pt has been more responsive today and stated that he wanted to get up OOB.  He was very drowsy when I arrived but agreeable to work with me.  Before we could get started he vomited yellow liquid.  He had been medicated for nausea/vomiting at noon.  We initiated therapeutic exercise to LEs to get an idea of his muscle strength and it is only 2/5 at best with poor endurance.  Currently he does not have the LE strength to try to transfer OOB.  He was extremely lethargic so this is perhaps skewing observable muscle strength.  Unless he improves radically before discharge I do not anticipate that he will be able to discharge to a home setting.  This will be discussed with Case Management/Social Work.  We will keep him on our schedule and try to increase strength/mobility as able.    Follow Up Recommendations SNF    Equipment Recommendations  None recommended by PT    Recommendations for Other Services    OT   Precautions / Restrictions Precautions Precautions: Fall Restrictions Weight Bearing Restrictions: No      Mobility  Bed Mobility               General bed mobility comments: unable to perform due to pt lethargy and nausea  Transfers                 General transfer comment: unable  Ambulation/Gait             General Gait Details: unable  Stairs            Wheelchair Mobility    Modified Rankin (Stroke Patients Only)       Balance Overall balance assessment:  (unable to evaluate)                                           Pertinent Vitals/Pain Pain Assessment: Faces Faces Pain Scale: Hurts even more Pain Descriptors / Indicators: Sore Pain Intervention(s): Limited activity within patient's tolerance;Monitored during session;Repositioned;Other (comment) (notified RN)    Home Living Family/patient expects to be discharged to:: Skilled nursing facility                      Prior Function           Comments: unknown...pt is unable to give any current history except to indicate that he ambulates with a walker     Hand Dominance   Dominant Hand: Right    Extremity/Trunk Assessment               Lower Extremity Assessment: Generalized weakness (generally 2-/5 strength, possibly due to level of arousal)  Communication   Communication: No difficulties (he has difficulty speaking due to severe lethargy)  Cognition Arousal/Alertness: Lethargic Behavior During Therapy: Flat affect Overall Cognitive Status: Difficult to assess                      General Comments      Exercises General Exercises - Lower Extremity Ankle Circles/Pumps: AAROM;Both;10 reps;Supine Short Arc Quad: AAROM;Both;10 reps;Supine Heel Slides: AAROM;Both;10 reps;Supine Hip ABduction/ADduction: AAROM;Both;10 reps;Supine      Assessment/Plan    PT Assessment Patient needs continued PT services  PT Diagnosis Difficulty walking;Generalized weakness   PT Problem List Decreased strength;Decreased activity tolerance;Decreased mobility;Cardiopulmonary status limiting activity  PT Treatment Interventions Functional mobility training;Therapeutic exercise;Therapeutic  activities;Gait training   PT Goals (Current goals can be found in the Care Plan section) Acute Rehab PT Goals Patient Stated Goal: none stated PT Goal Formulation: Patient unable to participate in goal setting Time For Goal Achievement: 09/01/15 Potential to Achieve Goals: Fair    Frequency Min 2X/week   Barriers to discharge   unknown but unless pt rebounds dramatically I do not think that he can function in a home setting until recovered    Co-evaluation               End of Session   Activity Tolerance: Patient limited by fatigue;Patient limited by lethargy Patient left: in bed;with call bell/phone within reach;with bed alarm set Nurse Communication: Mobility status;Need for lift equipment         Time: 0981-1914 PT Time Calculation (min) (ACUTE ONLY): 29 min   Charges:   PT Evaluation $Initial PT Evaluation Tier I: 1 Procedure     PT G CodesKonrad [name]  PT 08/18/2015, 1:38 PM [phone]

## 2015-08-18 NOTE — Progress Notes (Signed)
ANTIBIOTIC CONSULT NOTE  Pharmacy Consult for Vancomycin and Fortaz  Indication: Hypothermia  No Known Allergies  Patient Measurements: Height: 6' (182.9 cm) Weight: 146 lb 2.6 oz (66.3 kg) IBW/kg (Calculated) : 77.6   Vital Signs: Temp: 94.2 F (34.6 C) (08/19 1827) Temp Source: Rectal (08/19 1827) BP: 123/78 mmHg (08/19 1800) Pulse Rate: 88 (08/19 1800) Intake/Output from previous day: 08/18 0701 - 08/19 0700 In: 4888 [P.O.:720; I.V.:3168; IV Piggyback:1000] Out: 550 [Urine:550] Intake/Output from this shift:    Labs:  Recent Labs  08/17/15 0348  08/18/15 0135 08/18/15 0223 08/18/15 0927 08/18/15 1738  WBC 6.6  --  7.4 7.9  --   --   HGB 9.6*  --  8.1* 9.3*  --   --   PLT 84*  --  65* 67*  --   --   CREATININE 2.79*  < >  --  3.06* 3.04* 3.03*  < > = values in this interval not displayed. Estimated Creatinine Clearance: 27.7 mL/min (by C-G formula based on Cr of 3.03). No results for input(s): VANCOTROUGH, VANCOPEAK, VANCORANDOM, GENTTROUGH, GENTPEAK, GENTRANDOM, TOBRATROUGH, TOBRAPEAK, TOBRARND, AMIKACINPEAK, AMIKACINTROU, AMIKACIN in the last 72 hours.   Microbiology: Recent Results (from the past 720 hour(s))  Culture, body fluid-bottle     Status: None (Preliminary result)   Collection Time: 08/15/15  1:00 PM  Result Value Ref Range Status   Specimen Description FLUID PERITONEAL COLLECTED BY DOCTOR  Final   Special Requests BOTTLES DRAWN AEROBIC AND ANAEROBIC 10CC  Final   Culture NO GROWTH 3 DAYS  Final   Report Status PENDING  Incomplete  Gram stain     Status: None   Collection Time: 08/15/15  1:00 PM  Result Value Ref Range Status   Specimen Description FLUID PERITONEAL COLLECTED BY DOCTOR  Final   Special Requests NONE  Final   Gram Stain   Final    WBC PRESENT,BOTH PMN AND MONONUCLEAR NO ORGANISMS SEEN Performed at [facility]    Report Status 08/15/2015 FINAL  Final  C difficile quick scan w PCR reflex     Status: None   Collection  Time: 08/17/15  5:25 PM  Result Value Ref Range Status   C Diff antigen NEGATIVE NEGATIVE Final   C Diff toxin NEGATIVE NEGATIVE Final   C Diff interpretation Negative for toxigenic C. difficile  Final  MRSA PCR Screening     Status: Abnormal   Collection Time: 08/17/15  9:30 PM  Result Value Ref Range Status   MRSA by PCR POSITIVE (A) NEGATIVE Final    Comment:        The GeneXpert MRSA Assay (FDA approved for NASAL specimens only), is one component of a comprehensive MRSA colonization surveillance program. It is not intended to diagnose MRSA infection nor to guide or monitor treatment for MRSA infections. RESULT CALLED TO, READ BACK BY AND VERIFIED WITH: HAMMOCK S AT 2332 ON [id number] BY FORSYTH K     Anti-infectives    Start     Dose/Rate Route Frequency Ordered Stop   08/18/15 2000  cefTAZidime (FORTAZ) 2 g in dextrose 5 % 50 mL IVPB     2 g 100 mL/hr over 30 Minutes Intravenous Every 24 hours 08/18/15 1926     08/18/15 1930  vancomycin (VANCOCIN) IVPB 1000 mg/200 mL premix     1,000 mg 200 mL/hr over 60 Minutes Intravenous  Once 08/18/15 1926        Assessment: Okay for Protocol,  Poor renal function.  Goal of Therapy:  Vancomycin trough level 15-20 mcg/ml  Plan:  Fortaz 2gm IV every 24 hours. Vancomycin 1gm IV x 1, then 750mg  IV every 24 hours. Measure antibiotic drug levels at steady state Follow up culture results  Paul [name], Paul [name] 08/18/2015,7:29 PM

## 2015-08-19 ENCOUNTER — Ambulatory Visit (HOSPITAL_COMMUNITY)
Admission: RE | Admit: 2015-08-19 | Discharge: 2015-08-19 | Disposition: A | Discharge status: Home / Self Care | Payer: Medicaid Other | Source: Other Acute Inpatient Hospital | Attending: Internal Medicine | Admitting: Internal Medicine

## 2015-08-19 ENCOUNTER — Inpatient Hospital Stay (HOSPITAL_COMMUNITY): Payer: Medicaid Other

## 2015-08-19 DIAGNOSIS — E872 Acidosis: Secondary | ICD-10-CM

## 2015-08-19 DIAGNOSIS — K219 Gastro-esophageal reflux disease without esophagitis: Secondary | ICD-10-CM | POA: Diagnosis not present

## 2015-08-19 DIAGNOSIS — D696 Thrombocytopenia, unspecified: Secondary | ICD-10-CM

## 2015-08-19 LAB — BODY FLUID CELL COUNT WITH DIFFERENTIAL
LYMPHS FL: 23 %
MONOCYTE-MACROPHAGE-SEROUS FLUID: 58 % (ref 50–90)
NEUTROPHIL FLUID: 19 % (ref 0–25)
WBC FLUID: 8 uL (ref 0–1000)

## 2015-08-19 LAB — BASIC METABOLIC PANEL
ANION GAP: 5 (ref 5–15)
ANION GAP: 6 (ref 5–15)
BUN: 19 mg/dL (ref 6–20)
BUN: 19 mg/dL (ref 6–20)
CALCIUM: 7.1 mg/dL — AB (ref 8.9–10.3)
CALCIUM: 7.2 mg/dL — AB (ref 8.9–10.3)
CHLORIDE: 119 mmol/L — AB (ref 101–111)
CHLORIDE: 119 mmol/L — AB (ref 101–111)
CO2: 15 mmol/L — AB (ref 22–32)
CO2: 14 mmol/L — AB (ref 22–32)
Creatinine, Ser: 2.95 mg/dL — ABNORMAL HIGH (ref 0.61–1.24)
Creatinine, Ser: 2.96 mg/dL — ABNORMAL HIGH (ref 0.61–1.24)
GFR calc Af Amer: 27 mL/min — ABNORMAL LOW (ref >60)
GFR calc non Af Amer: 23 mL/min — ABNORMAL LOW (ref >60)
GFR calc non Af Amer: 23 mL/min — ABNORMAL LOW (ref >60)
GFR, EST AFRICAN AMERICAN: 27 mL/min — AB (ref >60)
GLUCOSE: 153 mg/dL — AB (ref 65–99)
GLUCOSE: 190 mg/dL — AB (ref 65–99)
Potassium: 3.7 mmol/L (ref 3.5–5.1)
Potassium: 4.1 mmol/L (ref 3.5–5.1)
Sodium: 139 mmol/L (ref 135–145)
Sodium: 139 mmol/L (ref 135–145)

## 2015-08-19 LAB — URINALYSIS, ROUTINE W REFLEX MICROSCOPIC
Glucose, UA: NEGATIVE mg/dL
Ketones, ur: NEGATIVE mg/dL
NITRITE: NEGATIVE
Protein, ur: NEGATIVE mg/dL
SPECIFIC GRAVITY, URINE: 1.02 (ref 1.005–1.030)
UROBILINOGEN UA: 0.2 mg/dL (ref 0.0–1.0)
pH: 5.5 (ref 5.0–8.0)

## 2015-08-19 LAB — URINE MICROSCOPIC-ADD ON

## 2015-08-19 LAB — GLUCOSE, CAPILLARY
GLUCOSE-CAPILLARY: 127 mg/dL — AB (ref 65–99)
Glucose-Capillary: 141 mg/dL — ABNORMAL HIGH (ref 65–99)
Glucose-Capillary: 162 mg/dL — ABNORMAL HIGH (ref 65–99)
Glucose-Capillary: 267 mg/dL — ABNORMAL HIGH (ref 65–99)

## 2015-08-19 LAB — ALBUMIN, FLUID (OTHER): Albumin, Fluid: <1 g/dL

## 2015-08-19 LAB — CBC
HEMATOCRIT: 22 % — AB (ref 39.0–52.0)
HEMOGLOBIN: 7.5 g/dL — AB (ref 13.0–17.0)
MCH: 31 pg (ref 26.0–34.0)
MCHC: 34.1 g/dL (ref 30.0–36.0)
MCV: 90.9 fL (ref 78.0–100.0)
Platelets: 52 10*3/uL — ABNORMAL LOW (ref 150–400)
RBC: 2.42 MIL/uL — AB (ref 4.22–5.81)
RDW: 16.4 % — ABNORMAL HIGH (ref 11.5–15.5)
WBC: 5.2 10*3/uL (ref 4.0–10.5)

## 2015-08-19 LAB — GRAM STAIN

## 2015-08-19 LAB — PROTEIN, BODY FLUID: Total protein, fluid: <3 g/dL

## 2015-08-19 LAB — PHOSPHORUS: Phosphorus: 2.1 mg/dL — ABNORMAL LOW (ref 2.5–4.6)

## 2015-08-19 LAB — ALBUMIN: Albumin: 1.9 g/dL — ABNORMAL LOW (ref 3.5–5.0)

## 2015-08-19 MED ORDER — K PHOS MONO-SOD PHOS DI & MONO 155-852-130 MG PO TABS
500.0000 mg | ORAL_TABLET | Freq: Two times a day (BID) | ORAL | Status: DC
  Administered 2015-08-19 – 2015-08-23 (×9): 500 mg via ORAL
  Filled 2015-08-19 (×13): qty 2

## 2015-08-19 MED ORDER — FUROSEMIDE 10 MG/ML IJ SOLN
120.0000 mg | Freq: Two times a day (BID) | INTRAVENOUS | Status: DC
  Filled 2015-08-19 (×4): qty 12

## 2015-08-19 MED ORDER — ALBUMIN HUMAN 25 % IV SOLN
50.0000 g | Freq: Two times a day (BID) | INTRAVENOUS | Status: AC
  Administered 2015-08-19: 50 g via INTRAVENOUS
  Filled 2015-08-19: qty 200

## 2015-08-19 MED ORDER — PANTOPRAZOLE SODIUM 40 MG PO TBEC
40.0000 mg | DELAYED_RELEASE_TABLET | Freq: Two times a day (BID) | ORAL | Status: DC
  Administered 2015-08-19 – 2015-08-23 (×8): 40 mg via ORAL
  Filled 2015-08-19 (×12): qty 1

## 2015-08-19 MED ORDER — ALBUMIN HUMAN 25 % IV SOLN
INTRAVENOUS | Status: AC
  Filled 2015-08-19: qty 100

## 2015-08-19 NOTE — Progress Notes (Signed)
Subjective: Patient offers no complaint  Objective: Vital signs in last 24 hours: Temp:  [92.7 F (33.7 C)-97.9 F (36.6 C)] 97.9 F (36.6 C) (08/20 0752) Pulse Rate:  [25-90] 83 (08/20 0200) Resp:  [16-28] 22 (08/20 0500) BP: (65-155)/(45-117) 102/67 mmHg (08/20 0500) SpO2:  [69 %-100 %] 98 % (08/20 0200) Weight:  [156 lb 8.4 oz (71 kg)] 156 lb 8.4 oz (71 kg) (08/20 0400)  Intake/Output from previous day: 08/19 0701 - 08/20 0700 In: 2774.6 [P.O.:960; I.V.:1514.6; IV Piggyback:300] Out: 950 [Urine:950] Intake/Output this shift:     Recent Labs  08/17/15 0348 08/18/15 0135 08/18/15 0223 08/19/15 0452  HGB 9.6* 8.1* 9.3* 7.5*    Recent Labs  08/18/15 0223 08/19/15 0452  WBC 7.9 5.2  RBC 3.04* 2.42*  HCT 27.3* 22.0*  PLT 67* 52*    Recent Labs  08/18/15 1738 08/19/15 0452  NA 137 139  K 3.9 3.7  CL 118* 119*  CO2 13* 15*  BUN 18 19  CREATININE 3.03* 2.95*  GLUCOSE 146* 153*  CALCIUM 7.0* 7.1*    Recent Labs  08/18/15 0927  INR 1.89*    Generally :Patient is awake and remained confused. Patient is not answering any questions. Chest is clear to auscultation His heart exam regular rate and rhythm Abdomen is distended, nontender Extremities trace edema  Assessment/Plan: Problem #1 acute kidney injury: Most likely prerenal syndrome versus ATN. Renal function is improving. Patient has about 950 cc of urin out put . Presently on Lasix and albumin  Problem #2 hypokalemia: His potassium has corrected. Patient is on potassium supplement was orally and also with IV fluid. Problem #3 anemia: His hemoglobin is low but stable Problem #4 polysubstance abuse: Cocaine and alcohol Problem #5 gastroenteritis: Presently seems to be improving Problem #6 alcoholic liver disease: Patient presently with ascites Problem #7 protein calorie malnutrition Problem #8 hypocalcemia: his calcium is very slowly improving Problem #9 hypophosphatemia: Phosphorus is low most  likely from his poor nutrition.  Plan: We'll continue his IV potassium supplement 2] We we'll DC by mouth potassium  3] we'll start patient on  K-Phos Neutral 500 mg by mouth twice a day  4] continue with albumin for today. 5] increase Lasix to 120 g IV twice a day  6] we'll check his basic metabolic panel in the morning.Marland Kitchen   Kennadee Walthour S 08/19/2015, 8:58 AM

## 2015-08-19 NOTE — Progress Notes (Signed)
[facility] PROGRESS NOTE  Paul [name] ZOX:[id number] DOB: 1966-10-06 DOA: 08/15/2015 PCP: No PCP Per Patient    Code Status: Full code Family Communication: Discussed with patient; family not available. Disposition Plan: Discharge when clinically appropriate   Consultants:  GI pending  Nephrology  Palliative care  Procedures:  Paracentesis, pending  PICC 8/18  In the ED, 8/16-paracentesis yielding 5.2 L.  Antibiotics:  None  HPI/Subjective: Patient was noted to be hypothermic last evening. Cultures were ordered and he was started on empiric antibiotics. Patient reports worsening abdominal pain compared to yesterday. He acknowledges some shortness of breath. Nursing reports continued loose stools.  Objective: Filed Vitals:   08/19/15 0800  BP:   Pulse: 100  Temp:   Resp:    temperature nadir 92.7. Current temperature 97.9. Pulse 100. Respiratory rate 22. Blood pressure 102/67. Oxygen saturation 98% on room air.  Intake/Output Summary (Last 24 hours) at 08/19/15 1021 Last data filed at 08/19/15 0549  Gross per 24 hour  Intake 2454.58 ml  Output    950 ml  Net 1504.58 ml   Filed Weights   08/17/15 0500 08/18/15 0500 08/19/15 0400  Weight: 63.2 kg (139 lb 5.3 oz) 66.3 kg (146 lb 2.6 oz) 71 kg (156 lb 8.4 oz)    Exam:   General:  Ill-appearing 49 year old African-American man in no acute distress. He is alert  Cardiovascular: S1, S2, no murmurs rubs or gallops.  Respiratory: Mildly labored breathing. Lungs clear anteriorly, decreased breath sounds in the bases.  Abdomen: Positive bowel sounds, abdomen is more tense than yesterday; moderately diffusely tender.  Musculoskeletal/extremities: No pedal edema. No acute hot red joints.  Psychiatric/  neurologic: The patient is less lethargic-more alert; he is  oriented to himself, hospital. No signs of tremulousness.    Data Reviewed: Basic Metabolic Panel:  Recent Labs Lab 08/15/15 2310  08/16/15 0426  08/18/15 0223 08/18/15 0927 08/18/15 1738 08/19/15 0452 08/19/15 0904  NA 134* 136  < > 139 136 137 139 139  K <2.0* <2.0*  < > 3.0* 3.7 3.9 3.7 4.1  CL 105 106  < > 116* 117* 118* 119* 119*  CO2 19* 19*  < > 15* 13* 13* 15* 14*  GLUCOSE 176* 151*  < > 116* 176* 146* 153* 190*  BUN 21* 21*  < > 21* CREATININE 2.96* 3.00*  < > 3.06* 3.04* 3.03* 2.95* 2.96*  CALCIUM 6.1* 6.3*  < > 6.7* 6.7* 7.0* 7.1* 7.2*  MG 2.2 2.3  --  2.1  --   --   --   --   PHOS  --   --   --   --   --   --  2.1*  --   < > = values in this interval not displayed. Liver Function Tests:  Recent Labs Lab 08/15/15 1403 08/16/15 0426 08/17/15 0348  AST 49* 49* 45*  ALT 26 28 32  ALKPHOS 143* 154* 143*  BILITOT 2.7* 2.4* 2.1*  PROT 5.2* 5.3* 4.9*  ALBUMIN 1.2* 1.3* 1.2*    Recent Labs Lab 08/15/15 1403  LIPASE 11*    Recent Labs Lab 08/15/15 1403 08/17/15 0939 08/18/15 0927  AMMONIA 122* 62* 87*   CBC:  Recent Labs Lab 08/15/15 1403 08/16/15 0426 08/17/15 0348 08/18/15 0135 08/18/15 0223 08/19/15 0452  WBC 7.0 6.7 6.6 7.4 7.9 5.2  NEUTROABS 4.5  --   --   --   --   --  HGB 8.9* 10.0* 9.6* 8.1* 9.3* 7.5*  HCT 25.7* 28.3* 28.4* 23.9* 27.3* 22.0*  MCV 88.0 86.8 89.6 89.5 89.8 90.9  PLT 81* 90* 84* 65* 67* 52*   Cardiac Enzymes: No results for input(s): CKTOTAL, CKMB, CKMBINDEX, TROPONINI in the last 168 hours. BNP (last 3 results) No results for input(s): BNP in the last 8760 hours.  ProBNP (last 3 results) No results for input(s): PROBNP in the last 8760 hours.  CBG:  Recent Labs Lab 08/18/15 0728 08/18/15 1120 08/18/15 1614 08/18/15 2049 08/19/15 0736  GLUCAP 132* 187* 110* 196* 141*    Recent Results (from the past 240 hour(s))  Culture, body fluid-bottle     Status: None (Preliminary result)   Collection Time: 08/15/15  1:00 PM  Result Value Ref Range Status   Specimen Description FLUID PERITONEAL COLLECTED BY DOCTOR  Final   Special  Requests BOTTLES DRAWN AEROBIC AND ANAEROBIC 10CC  Final   Culture NO GROWTH 4 DAYS  Final   Report Status PENDING  Incomplete  Gram stain     Status: None   Collection Time: 08/15/15  1:00 PM  Result Value Ref Range Status   Specimen Description FLUID PERITONEAL COLLECTED BY DOCTOR  Final   Special Requests NONE  Final   Gram Stain   Final    WBC PRESENT,BOTH PMN AND MONONUCLEAR NO ORGANISMS SEEN Performed at [facility]    Report Status 08/15/2015 FINAL  Final  C difficile quick scan w PCR reflex     Status: None   Collection Time: 08/17/15  5:25 PM  Result Value Ref Range Status   C Diff antigen NEGATIVE NEGATIVE Final   C Diff toxin NEGATIVE NEGATIVE Final   C Diff interpretation Negative for toxigenic C. difficile  Final  MRSA PCR Screening     Status: Abnormal   Collection Time: 08/17/15  9:30 PM  Result Value Ref Range Status   MRSA by PCR POSITIVE (A) NEGATIVE Final    Comment:        The GeneXpert MRSA Assay (FDA approved for NASAL specimens only), is one component of a comprehensive MRSA colonization surveillance program. It is not intended to diagnose MRSA infection nor to guide or monitor treatment for MRSA infections. RESULT CALLED TO, READ BACK BY AND VERIFIED WITH: HAMMOCK S AT 2332 ON [id number] BY FORSYTH K   Culture, blood (routine x 2)     Status: None (Preliminary result)   Collection Time: 08/18/15  7:10 PM  Result Value Ref Range Status   Specimen Description BLOOD RIGHT ARM  Final   Special Requests BOTTLES DRAWN AEROBIC AND ANAEROBIC 6CC  Final   Culture NO GROWTH < 24 HOURS  Final   Report Status PENDING  Incomplete  Culture, blood (routine x 2)     Status: None (Preliminary result)   Collection Time: 08/18/15  8:02 PM  Result Value Ref Range Status   Specimen Description BLOOD PIC LINE  Final   Special Requests BOTTLES DRAWN AEROBIC AND ANAEROBIC 6CC  Final   Culture NO GROWTH < 12 HOURS  Final   Report Status PENDING  Incomplete      Studies: Ct Abdomen Pelvis Wo Contrast  08/19/2015   CLINICAL DATA:  Hypothermia.  Ascites due to alcoholic cirrhosis.  EXAM: CT ABDOMEN AND PELVIS WITHOUT CONTRAST  TECHNIQUE: Multidetector CT imaging of the abdomen and pelvis was performed following the standard protocol without IV contrast.  COMPARISON:  02/19/2015  FINDINGS: BODY WALL: No contributory findings.  LOWER CHEST: Bibasilar atelectasis with small pleural effusions.  There is a large hiatal hernia with distended lower esophagus. When accounting for rugal folds herniating into the lower chest, no definite varices seen.  Anemia based on low-density blood pool.  ABDOMEN/PELVIS:  Liver: Severe fatty infiltration of the liver.  Biliary: High-density sludge present. No discrete calcified stone or over distention.  Pancreas: Chronic pancreatitis with diffuse coarse calcifications and a chronic 12 mm cyst in the head. No acute pancreatitis findings.  Spleen: Unremarkable.  Adrenals: Unremarkable.  Kidneys and ureters: No hydronephrosis or stone.  Bladder: Decompressed around a Foley catheter  Reproductive: No pathologic findings.  Bowel: Diffuse thick walled appearance of the colon is likely from collapse and portal hypertension. No inflammatory small bowel thickening. Negative appendix. Thick appearance of the stomach diffusely, although under distended.  Retroperitoneum: No mass or adenopathy.  Peritoneum: Large volume non loculated ascites throughout the abdomen.  Vascular: No acute abnormality.  OSSEOUS: Bilateral L5 pars defects with focally advanced degenerative disc narrowing and grade 1 anterolisthesis.  IMPRESSION: 1. Tense ascites. 2. Severe hepatic steatosis. 3. Chronic pancreatitis without active inflammation. 4. Colonic thickening likely from underdistention and portal colopathy, infectious colitis not excluded. 5. Patulous esophagus and hiatal hernia with large volume gastroesophageal reflux. 6. Thickened gastric wall not entirely explained  by underdistention and potentially reflecting gastritis.   Electronically Signed   By: Marnee Spring M.D.   On: 08/19/2015 05:00   Dg Chest Port 1 View  08/17/2015   CLINICAL DATA:  Right PICC line placement.  EXAM: PORTABLE CHEST - 1 VIEW  COMPARISON:  02/17/2015  FINDINGS: The cardiac silhouette, mediastinal and hilar contours are normal. Low lung volumes with vascular crowding and streaky basilar atelectasis. The right PICC line tip is in the distal SVC near the cavoatrial junction.  IMPRESSION: Right PICC line tip in good position in the distal SVC.  Low lung volumes with vascular crowding and bibasilar atelectasis.   Electronically Signed   By: Rudie Meyer M.D.   On: 08/17/2015 11:49    Scheduled Meds: . albumin human  25 g Intravenous BID  . cefTAZidime (FORTAZ)  IV  2 g Intravenous Q24H  . Chlorhexidine Gluconate Cloth  6 each Topical Q0600  . folic acid  1 mg Oral Daily  . furosemide  120 mg Intravenous BID  . insulin aspart  0-9 Units Subcutaneous TID WC  . mupirocin ointment  1 application Nasal BID  . nicotine  14 mg Transdermal Daily  . ondansetron (ZOFRAN) IV  4 mg Intravenous 4 times per day  . pantoprazole  40 mg Oral Daily  . phenytoin  50 mg Oral BID  . phosphorus  500 mg Oral BID  . sodium bicarbonate  650 mg Oral TID  . sodium chloride  10-40 mL Intracatheter Q12H  . sodium chloride  3 mL Intravenous Q12H  . spironolactone  50 mg Oral BID  . thiamine IV  100 mg Intravenous Daily  . vancomycin  750 mg Intravenous Q24H   Continuous Infusions: . dextrose 5 % and 0.45% NaCl 1,000 mL with potassium chloride 40 mEq, sodium bicarbonate 50 mEq infusion 125 mL/hr at 08/18/15 2002    Assessment and plan:  Principal Problem:   Viral gastroenteritis Active Problems:   Alcohol abuse   Hypokalemia   End stage liver disease   AKI (acute kidney injury)   Hepatorenal syndrome   Dehydration   Hypotension   Ascites due to alcoholic hepatitis   Encephalopathy, hepatic  Metabolic acidosis   Cocaine abuse   Tobacco abuse   Seizure disorder   Thrombocytopenia   Type 2 diabetes with complication   Normocytic anemia   Hypoalbuminemia   Palliative care encounter   DNR (do not resuscitate) discussion   Protein-calorie malnutrition, severe   1. Nausea, vomiting, and reported diarrhea; abdominal pain. Working diagnosis on admission was viral gastroenteritis, but he was started on treatment for alcoholic gastritis. Of note, the patient underwent a paracentesis in the ED on 8/16 yielding 5.2 L of clear yellowish fluid. The fluid analysis  not consistent with infection. -He  continues to have loose stools secondary to lactulose given. C. difficile PCR was negative. -Lactulose was discontinued on 8/19. -He was started on IV Protonix and scheduled IV Lasix. Both have been changed to the oral route. We'll continue when necessary IV Phenergan. CT scan of abdomen and pelvis on 8/19 revealed -Tense ascites, severe hepatic steatosis, chronic pancreatitis, colonic thickening, large volume GERD, thickened gastric wall. -We will increase Protonix to twice a day. Full liquid diet with patient sitting up after eating as tolerated. -We'll consult GI for further evaluation of GERD, thickened gastric wall.  Hypothermia, etiology not clear, but investigating for sepsis. Patient became hypothermic overnight 8/19. Not sure if it coincided with albumen infusion. -Studies ordered including CT scan of the abdomen and pelvis with results dictated above. His chest x-ray on 8/18 revealed bibasilar atelectasis.  -His urinalysis revealed many bacteria and 7-10 WBCs, likely a urinary tract infection. -Blood cultures ordered and results are pending. -Peritoneal fluid culture on 8/16 is negative. -Patient was started on Fortaz and vancomycin empirically on 8/19.  UTI. Antibiotics started as above. Foley catheter was inserted a few days ago for strict ins and outs. -Urine culture  pending.  Probable end-stage liver disease with alcoholic hepatitis/cirrhosis/ascites/hyperbilirubinemia. -Mild hepatic encephalopathy. Patient has a history of multiple hospitalizations for alcoholic hepatitis. He continues to drink despite many conversations with the patient over the years by the hospitalist. Patient reports no intention of stopping. He is wholly noncompliant. -Query hepatorenal syndrome. -His ammonia level was elevated at 122 on admission. He was started on lactulose. His ammonia level has improved. He is still relatively oriented. -Status post paracentesis in the ED yielding 5.2 L of yellowish fluid. Analysis revealed a WBC of 48. Fluid culture negative to date. -Nephrology started Lasix/albumin infusion along with IV fluids. Aldactone was added and increased to 50 mg twice a day.  -CT abdomen and pelvis on 8/19 revealed tense ascites and severe hepatic steatosis. Given that the patient has some increase in work of breathing and moderate to severe abdominal pain from the tense ascites, I have asked interventional radiology at [facility], Dr. Loreta Ave, to perform a therapeutic paracentesis primarily and diagnostic paracentesis secondarily.  Severe hypoalbuminemia secondary to cirrhosis and severe malnutrition, causing hypocalcemia. -Patient's severe hypoalbuminemia is secondary to alcoholic liver disease and malnutrition. -Corrected calcium is 8.7.  Severe hypokalemia. Etiology may be secondary to reported diarrhea, but the patient received a total of 11 runs of IV potassium following admission (which was confirmed by nursing) and his serum potassium has barely improved. His magnesium level was within normal limits. -Given his acute renal failure, there was caution with regards to giving him more IV runs of potassium, but  potassium was added to his maintenance IV fluids. Aldactone was started empirically as tolerated. -Nephrology consulted and started oral potassium and increased the  dose of Aldactone to 50 twice a day. -He was cautiously given  another 4 runs of IV potassium on 8/18. give  - Urine potassium, sodium, creatinine, chloride levels ordered and are pending. -His serum potassium has improved. Continue to monitor his potassium levels.  Acute kidney injury. The patient's creatinine during the previous hospitalization was within normal limits at 0.84. On admission, it was 3.2. This is presumed to be secondary to prerenal azotemia, query hepatorenal syndrome. -His urine output is nonoliguric, but could be better. -IV fluids were restarted with potassium chloride added. A Foley catheter was inserted for strict ins and outs. -Renal ultrasound was ordered and revealed mildly echogenic kidneys, consistent with medical renal disease. -Nephrology was consulted.  Aldactone dosing was increased; IV Lasix with dose increased, and started albumin infusion, 2 days.  Metabolic acidosis. -Patient CO2 has fallen. His lactic acid level was slightly above normal range. Oral sodium bicarbonate was started in addition to adding bicarbonate to the IV fluids. Lactulose was discontinued due to the patient's frequent loose stools.   Chronic normocytic anemia and thrombocytopenia, likely secondary to alcoholic liver disease/cirrhosis. - Studies orders for evaluation and revealed ferritin 516, vitamin B12 1621.  Type 2 diabetes mellitus. Patient has been noncompliant with therapy. His CBGs have been reasonable, ranging from 120-170. -Sliding scale NovoLog was started. Hemoglobin A1c was 5.5.  Alcohol and cocaine abuse; tobacco abuse. Patient has been counseled multiple times during previous hospitalizations. He appears to have no intention of stopping. He has a history of leaving AMA during most of not all of the previous hospitalizations. -Palliative care consultation was ordered. Assessment noted and appreciated. -Nevertheless, he was advised and encouraged to stop all 3. -He does  not appear to be in alcohol withdrawal yet. -We'll continue diazepam via the alcohol withdrawal protocol. -We'll continue  thiamine and folate.  Seizure disorder. The patient had been prescribed Dilantin, but he had been noncompliant. We'll continue Dilantin was ordered and started IV. Will change to by mouth and decreased the dose when tolerated. Seizure disorder is likely secondary to alcohol withdrawal syndrome. He may not need Dilantin indefinitely.  Mildly elevated TSH. Free T4 was within normal limits.  Time spent: 35 minutes.     [facility]  [facility] Pager [phone]. If 7PM-7AM, please contact night-coverage at [email or web address], password [facility] 08/19/2015, 10:21 AM  LOS: 4 days

## 2015-08-19 NOTE — Progress Notes (Signed)
Patient returned from [facility] via [employer].

## 2015-08-19 NOTE — Progress Notes (Signed)
Attempt to replace rectal probe patient combative and trying to kick.

## 2015-08-19 NOTE — Procedures (Signed)
Successful US guided paracentesis from RLQ.  Yielded 6.5 liters of serous fluid.  No immediate complications.  Pt tolerated well.   Specimen was sent for labs.  Pattricia Boss D PA-C 08/19/2015 1:14 PM

## 2015-08-19 NOTE — Progress Notes (Signed)
Patient being transported by [facility] to [facility] for paracentesis.

## 2015-08-19 NOTE — Progress Notes (Signed)
Notified Dr. Kristian Covey regarding Lasix and low blood pressure. Order given to hold Lasix infusion tonight and increase albumin to 50 grams tonight.

## 2015-08-20 DIAGNOSIS — K7011 Alcoholic hepatitis with ascites: Secondary | ICD-10-CM

## 2015-08-20 DIAGNOSIS — A419 Sepsis, unspecified organism: Secondary | ICD-10-CM

## 2015-08-20 DIAGNOSIS — D6489 Other specified anemias: Secondary | ICD-10-CM | POA: Diagnosis present

## 2015-08-20 DIAGNOSIS — K767 Hepatorenal syndrome: Secondary | ICD-10-CM

## 2015-08-20 DIAGNOSIS — F1010 Alcohol abuse, uncomplicated: Secondary | ICD-10-CM

## 2015-08-20 DIAGNOSIS — K7290 Hepatic failure, unspecified without coma: Secondary | ICD-10-CM

## 2015-08-20 HISTORY — DX: Sepsis, unspecified organism: A41.9

## 2015-08-20 LAB — CBC
HCT: 22.8 % — ABNORMAL LOW (ref 39.0–52.0)
HEMATOCRIT: 18.5 % — AB (ref 39.0–52.0)
HEMOGLOBIN: 6.3 g/dL — AB (ref 13.0–17.0)
Hemoglobin: 7.9 g/dL — ABNORMAL LOW (ref 13.0–17.0)
MCH: 31 pg (ref 26.0–34.0)
MCH: 31.1 pg (ref 26.0–34.0)
MCHC: 34.1 g/dL (ref 30.0–36.0)
MCHC: 34.6 g/dL (ref 30.0–36.0)
MCV: 91.1 fL (ref 78.0–100.0)
MCV: 89.8 fL (ref 78.0–100.0)
Platelets: 30 10*3/uL — ABNORMAL LOW (ref 150–400)
Platelets: 86 10*3/uL — ABNORMAL LOW (ref 150–400)
RBC: 2.03 MIL/uL — ABNORMAL LOW (ref 4.22–5.81)
RBC: 2.54 MIL/uL — ABNORMAL LOW (ref 4.22–5.81)
RDW: 16.3 % — ABNORMAL HIGH (ref 11.5–15.5)
RDW: 16.2 % — AB (ref 11.5–15.5)
WBC: 5.5 10*3/uL (ref 4.0–10.5)
WBC: 5.8 10*3/uL (ref 4.0–10.5)

## 2015-08-20 LAB — COMPREHENSIVE METABOLIC PANEL
ALK PHOS: 101 U/L (ref 38–126)
ALT: 21 U/L (ref 17–63)
AST: 21 U/L (ref 15–41)
Albumin: 2.2 g/dL — ABNORMAL LOW (ref 3.5–5.0)
Anion gap: 4 — ABNORMAL LOW (ref 5–15)
BUN: 17 mg/dL (ref 6–20)
CALCIUM: 7.4 mg/dL — AB (ref 8.9–10.3)
CO2: 17 mmol/L — AB (ref 22–32)
CREATININE: 2.46 mg/dL — AB (ref 0.61–1.24)
Chloride: 122 mmol/L — ABNORMAL HIGH (ref 101–111)
GFR, EST AFRICAN AMERICAN: 34 mL/min — AB (ref >60)
GFR, EST NON AFRICAN AMERICAN: 29 mL/min — AB (ref >60)
Glucose, Bld: 177 mg/dL — ABNORMAL HIGH (ref 65–99)
Potassium: 3.9 mmol/L (ref 3.5–5.1)
Sodium: 143 mmol/L (ref 135–145)
Total Bilirubin: 1.8 mg/dL — ABNORMAL HIGH (ref 0.3–1.2)
Total Protein: 4.5 g/dL — ABNORMAL LOW (ref 6.5–8.1)

## 2015-08-20 LAB — DIC (DISSEMINATED INTRAVASCULAR COAGULATION)PANEL
D-Dimer, Quant: 3.49 ug/mL-FEU — ABNORMAL HIGH (ref 0.00–0.48)
Fibrinogen: 100 mg/dL — ABNORMAL LOW (ref 204–475)
Prothrombin Time: 28.7 seconds — ABNORMAL HIGH (ref 11.6–15.2)
Smear Review: NONE SEEN
aPTT: 74 seconds — ABNORMAL HIGH (ref 24–37)

## 2015-08-20 LAB — C DIFFICILE QUICK SCREEN W PCR REFLEX
C Diff antigen: NEGATIVE
C Diff interpretation: NEGATIVE
C Diff toxin: NEGATIVE

## 2015-08-20 LAB — OCCULT BLOOD X 1 CARD TO LAB, STOOL: Fecal Occult Bld: NEGATIVE

## 2015-08-20 LAB — GLUCOSE, CAPILLARY
GLUCOSE-CAPILLARY: 144 mg/dL — AB (ref 65–99)
GLUCOSE-CAPILLARY: 117 mg/dL — AB (ref 65–99)
GLUCOSE-CAPILLARY: 257 mg/dL — AB (ref 65–99)
Glucose-Capillary: 116 mg/dL — ABNORMAL HIGH (ref 65–99)

## 2015-08-20 LAB — PHOSPHORUS: PHOSPHORUS: 2 mg/dL — AB (ref 2.5–4.6)

## 2015-08-20 LAB — DIC (DISSEMINATED INTRAVASCULAR COAGULATION) PANEL
INR: 2.75 — AB (ref 0.00–1.49)
PLATELETS: 32 10*3/uL — AB (ref 150–400)

## 2015-08-20 LAB — CULTURE, BODY FLUID W GRAM STAIN -BOTTLE: Culture: NO GROWTH

## 2015-08-20 LAB — PROTIME-INR
INR: 3.12 — AB (ref 0.00–1.49)
PROTHROMBIN TIME: 31.5 s — AB (ref 11.6–15.2)

## 2015-08-20 LAB — CULTURE, BODY FLUID-BOTTLE

## 2015-08-20 MED ORDER — VITAMIN K1 10 MG/ML IJ SOLN
INTRAMUSCULAR | Status: AC
  Filled 2015-08-20: qty 1

## 2015-08-20 MED ORDER — DEXTROSE 5 % IV SOLN
2.0000 g | Freq: Two times a day (BID) | INTRAVENOUS | Status: DC
  Administered 2015-08-20 – 2015-08-23 (×7): 2 g via INTRAVENOUS
  Filled 2015-08-20 (×9): qty 2

## 2015-08-20 MED ORDER — VITAMIN K1 10 MG/ML IJ SOLN
10.0000 mg | Freq: Every day | INTRAVENOUS | Status: AC
  Administered 2015-08-20 – 2015-08-22 (×3): 10 mg via INTRAVENOUS
  Filled 2015-08-20 (×4): qty 1

## 2015-08-20 MED ORDER — SODIUM CHLORIDE 0.9 % IV SOLN: Freq: Once | INTRAVENOUS | Status: AC

## 2015-08-20 MED ORDER — SODIUM CHLORIDE 0.9 % IV SOLN
Freq: Once | INTRAVENOUS | Status: AC
  Administered 2015-08-20: 08:00:00 via INTRAVENOUS

## 2015-08-20 MED ORDER — VITAMIN B-1 100 MG PO TABS
100.0000 mg | ORAL_TABLET | Freq: Every day | ORAL | Status: DC
  Administered 2015-08-20 – 2015-08-23 (×4): 100 mg via ORAL
  Filled 2015-08-20 (×5): qty 1

## 2015-08-20 MED ORDER — PHENYTOIN 50 MG PO CHEW
50.0000 mg | CHEWABLE_TABLET | Freq: Every day | ORAL | Status: DC
  Administered 2015-08-21 – 2015-08-24 (×4): 50 mg via ORAL
  Filled 2015-08-20 (×4): qty 1

## 2015-08-20 MED ORDER — VANCOMYCIN HCL IN DEXTROSE 1-5 GM/200ML-% IV SOLN
1000.0000 mg | INTRAVENOUS | Status: DC
  Administered 2015-08-20: 1000 mg via INTRAVENOUS
  Filled 2015-08-20 (×3): qty 200

## 2015-08-20 MED ORDER — FUROSEMIDE 10 MG/ML IJ SOLN
60.0000 mg | Freq: Two times a day (BID) | INTRAMUSCULAR | Status: DC
Start: 2015-08-20 — End: 2015-08-21
  Administered 2015-08-20 – 2015-08-21 (×2): 60 mg via INTRAVENOUS
  Filled 2015-08-20 (×2): qty 6

## 2015-08-20 NOTE — Progress Notes (Signed)
Addendum:  Although the patient is mildly encephalopathic, he did give permission to discuss his medical conditions with his family. I had a brief family meeting with the patient's family including his mother, Mrs. Narda Amber, his brother Shawna Clamp, his brother Theone Murdoch, and his close cousin Vernon Prey. The family agreed that Ms. Amie Critchley was the closest relative to the patient and she would be the one to assist with making healthcare decisions with the remainder of the family's input. They designated her as the go-to person for decision making.  I reviewed with them the patient's end-stage liver disease with cirrhosis/alcoholic hepatitis. Also informed them that he had become septic and that his blood counts had decreased significantly. In addition, I informed them that he was in acute renal failure and may have a condition called hepatorenal syndrome which carries a very poor prognosis. I informed them that we are providing him with appropriate treatments including paracentesis, IV antibiotics, blood transfusions, and supportive treatment.  Nevertheless, I presented to them a very poor prognosis for the patient and it was possible that he would not survive this hospitalization. I discussed CODE STATUS with the family, but they were unsure about a DO NOT RESUSCITATE status for now. I gently advised a DO NOT RESUSCITATE status and a consideration for hospice in the next day or 2 if he does not show significant clinical improvement. At that time, I will readdress the issue and will discuss any changes in his medical condition.   Total meeting time approximately 20 minutes.

## 2015-08-20 NOTE — Progress Notes (Addendum)
[facility] PROGRESS NOTE  EMANUEL DOWSON ZOX:[id number] DOB: [birth year] DOA: 08/15/2015 PCP: No PCP Per Patient    Code Status: Full code Family Communication: Discussed with close friend Mr. Irish Elders (with permission of patient); family not available Disposition Plan: Discharge when clinically appropriate   Consultants:  GI pending  Nephrology  Palliative care  Procedures:  Paracentesis 8/20, per IR at [facility], yielding 6.5 L of serous fluid.  PICC 8/18  In the ED, 8/16-paracentesis yielding 5.2 L.  Antibiotics:  Elita Quick 8/20>>  Vancomycin 8/20>>  HPI/Subjective: Patient was denies abdominal pain status post paracentesis on 8/20. Nursing reports continued loose stools this is obtaining a rectal tube. Patient denies nausea vomiting.  Objective: Filed Vitals:   08/20/15 0841  BP:   Pulse:   Temp: 96.1 F (35.6 C)  Resp:    temperature nadir 92.7. Current temperature 97.9. Pulse 100. Respiratory rate 22. Blood pressure 102/67. Oxygen saturation 98% on room air.  Intake/Output Summary (Last 24 hours) at 08/20/15 0900 Last data filed at 08/20/15 0500  Gross per 24 hour  Intake    720 ml  Output    550 ml  Net    170 ml   Filed Weights   08/18/15 0500 08/19/15 0400 08/20/15 0500  Weight: 66.3 kg (146 lb 2.6 oz) 71 kg (156 lb 8.4 oz) 64.1 kg (141 lb 5 oz)    Exam:   General:  Ill-appearing 49 year old African-American man in no acute distress. He is intermittently alert/lethargic.  Cardiovascular: S1, S2, no murmurs rubs or gallops.  Respiratory: Breathing nonlabored. Lungs clear anteriorly, decreased breath sounds in the bases.  Abdomen: Positive bowel sounds, abdomen is significantly less distended and tense; still with mild ascites. Mildly tender diffusely.  Musculoskeletal/extremities: No pedal edema. No acute hot red joints.  Psychiatric/  neurologic: The patient is semi--alert and oriented to himself, his friend, and hospital. He is not  oriented to year or president. He has mild tremulousness. Positive asterixis bilaterally. He mostly keeps his eyes closed, but when prompted, he appears agitated and wants to be left alone.  Data Reviewed: Basic Metabolic Panel:  Recent Labs Lab 08/15/15 2310 08/16/15 0426  08/18/15 0223 08/18/15 8119 08/18/15 1738 08/19/15 0452 08/19/15 0904 08/20/15 0453  NA 134* 136  < > 139 136 137 139 139 143  K <2.0* <2.0*  < > 3.0* 3.7 3.9 3.7 4.1 3.9  CL 105 106  < > 116* 117* 118* 119* 119* 122*  CO2 19* 19*  < > 15* 13* 13* 15* 14* 17*  GLUCOSE 176* 151*  < > 116* 176* 146* 153* 190* 177*  BUN 21* 21*  < > 21* 19 18 19 19 17   CREATININE 2.96* 3.00*  < > 3.06* 3.04* 3.03* 2.95* 2.96* 2.46*  CALCIUM 6.1* 6.3*  < > 6.7* 6.7* 7.0* 7.1* 7.2* 7.4*  MG 2.2 2.3  --  2.1  --   --   --   --   --   PHOS  --   --   --   --   --   --  2.1*  --  2.0*  < > = values in this interval not displayed. Liver Function Tests:  Recent Labs Lab 08/15/15 1403 08/16/15 0426 08/17/15 0348 08/19/15 1508 08/20/15 0453  AST 49* 49* 45*  --  21  ALT 26 28 32  --  21  ALKPHOS 143* 154* 143*  --  101  BILITOT 2.7* 2.4* 2.1*  --  1.8*  PROT 5.2* 5.3* 4.9*  --  4.5*  ALBUMIN 1.2* 1.3* 1.2* 1.9* 2.2*    Recent Labs Lab 08/15/15 1403  LIPASE 11*    Recent Labs Lab 08/15/15 1403 08/17/15 0939 08/18/15 0927  AMMONIA 122* 62* 87*   CBC:  Recent Labs Lab 08/15/15 1403  08/17/15 0348 08/18/15 0135 08/18/15 0223 08/19/15 0452 08/20/15 0453 08/20/15 0803  WBC 7.0  < > 6.6 7.4 7.9 5.2 5.5  --   NEUTROABS 4.5  --   --   --   --   --   --   --   HGB 8.9*  < > 9.6* 8.1* 9.3* 7.5* 6.3*  --   HCT 25.7*  < > 28.4* 23.9* 27.3* 22.0* 18.5*  --   MCV 88.0  < > 89.6 89.5 89.8 90.9 91.1  --   PLT 81*  < > 84* 65* 67* 52* 30* 32*  < > = values in this interval not displayed. Cardiac Enzymes: No results for input(s): CKTOTAL, CKMB, CKMBINDEX, TROPONINI in the last 168 hours. BNP (last 3 results) No  results for input(s): BNP in the last 8760 hours.  ProBNP (last 3 results) No results for input(s): PROBNP in the last 8760 hours.  CBG:  Recent Labs Lab 08/19/15 0736 08/19/15 1117 08/19/15 1632 08/19/15 2113 08/20/15 0730  GLUCAP 141* 127* 162* 267* 144*    Recent Results (from the past 240 hour(s))  Culture, body fluid-bottle     Status: None   Collection Time: 08/15/15  1:00 PM  Result Value Ref Range Status   Specimen Description FLUID PERITONEAL COLLECTED BY DOCTOR  Final   Special Requests BOTTLES DRAWN AEROBIC AND ANAEROBIC 10CC  Final   Culture NO GROWTH 5 DAYS  Final   Report Status 08/20/2015 FINAL  Final  Gram stain     Status: None   Collection Time: 08/15/15  1:00 PM  Result Value Ref Range Status   Specimen Description FLUID PERITONEAL COLLECTED BY DOCTOR  Final   Special Requests NONE  Final   Gram Stain   Final    WBC PRESENT,BOTH PMN AND MONONUCLEAR NO ORGANISMS SEEN Performed at [facility]    Report Status 08/15/2015 FINAL  Final  C difficile quick scan w PCR reflex     Status: None   Collection Time: 08/17/15  5:25 PM  Result Value Ref Range Status   C Diff antigen NEGATIVE NEGATIVE Final   C Diff toxin NEGATIVE NEGATIVE Final   C Diff interpretation Negative for toxigenic C. difficile  Final  MRSA PCR Screening     Status: Abnormal   Collection Time: 08/17/15  9:30 PM  Result Value Ref Range Status   MRSA by PCR POSITIVE (A) NEGATIVE Final    Comment:        The GeneXpert MRSA Assay (FDA approved for NASAL specimens only), is one component of a comprehensive MRSA colonization surveillance program. It is not intended to diagnose MRSA infection nor to guide or monitor treatment for MRSA infections. RESULT CALLED TO, READ BACK BY AND VERIFIED WITH: HAMMOCK S AT 2332 ON [id number] BY FORSYTH K   Culture, blood (routine x 2)     Status: None (Preliminary result)   Collection Time: 08/18/15  7:10 PM  Result Value Ref Range Status    Specimen Description BLOOD RIGHT ARM  Final   Special Requests BOTTLES DRAWN AEROBIC AND ANAEROBIC 6CC  Final   Culture NO GROWTH 2 DAYS  Final   Report  Status PENDING  Incomplete  Culture, blood (routine x 2)     Status: None (Preliminary result)   Collection Time: 08/18/15  8:02 PM  Result Value Ref Range Status   Specimen Description BLOOD PIC LINE  Final   Special Requests BOTTLES DRAWN AEROBIC AND ANAEROBIC 6CC  Final   Culture NO GROWTH 2 DAYS  Final   Report Status PENDING  Incomplete  Gram stain     Status: None   Collection Time: 08/19/15  5:13 PM  Result Value Ref Range Status   Specimen Description PERITONEAL  Final   Special Requests NONE  Final   Gram Stain   Final    CYTOSPIN SLIDE WBC PRESENT,BOTH PMN AND MONONUCLEAR NO ORGANISMS SEEN    Report Status 08/19/2015 FINAL  Final  C difficile quick scan w PCR reflex     Status: None   Collection Time: 08/19/15  9:15 PM  Result Value Ref Range Status   C Diff antigen NEGATIVE NEGATIVE Final   C Diff toxin NEGATIVE NEGATIVE Final   C Diff interpretation Negative for toxigenic C. difficile  Final     Studies: Ct Abdomen Pelvis Wo Contrast  08/19/2015   CLINICAL DATA:  Hypothermia.  Ascites due to alcoholic cirrhosis.  EXAM: CT ABDOMEN AND PELVIS WITHOUT CONTRAST  TECHNIQUE: Multidetector CT imaging of the abdomen and pelvis was performed following the standard protocol without IV contrast.  COMPARISON:  02/19/2015  FINDINGS: BODY WALL: No contributory findings.  LOWER CHEST: Bibasilar atelectasis with small pleural effusions.  There is a large hiatal hernia with distended lower esophagus. When accounting for rugal folds herniating into the lower chest, no definite varices seen.  Anemia based on low-density blood pool.  ABDOMEN/PELVIS:  Liver: Severe fatty infiltration of the liver.  Biliary: High-density sludge present. No discrete calcified stone or over distention.  Pancreas: Chronic pancreatitis with diffuse coarse  calcifications and a chronic 12 mm cyst in the head. No acute pancreatitis findings.  Spleen: Unremarkable.  Adrenals: Unremarkable.  Kidneys and ureters: No hydronephrosis or stone.  Bladder: Decompressed around a Foley catheter  Reproductive: No pathologic findings.  Bowel: Diffuse thick walled appearance of the colon is likely from collapse and portal hypertension. No inflammatory small bowel thickening. Negative appendix. Thick appearance of the stomach diffusely, although under distended.  Retroperitoneum: No mass or adenopathy.  Peritoneum: Large volume non loculated ascites throughout the abdomen.  Vascular: No acute abnormality.  OSSEOUS: Bilateral L5 pars defects with focally advanced degenerative disc narrowing and grade 1 anterolisthesis.  IMPRESSION: 1. Tense ascites. 2. Severe hepatic steatosis. 3. Chronic pancreatitis without active inflammation. 4. Colonic thickening likely from underdistention and portal colopathy, infectious colitis not excluded. 5. Patulous esophagus and hiatal hernia with large volume gastroesophageal reflux. 6. Thickened gastric wall not entirely explained by underdistention and potentially reflecting gastritis.   Electronically Signed   By: Marnee Spring M.D.   On: 08/19/2015 05:00    Scheduled Meds: . sodium chloride   Intravenous Once  . sodium chloride   Intravenous Once  . cefTAZidime (FORTAZ)  IV  2 g Intravenous Q12H  . Chlorhexidine Gluconate Cloth  6 each Topical Q0600  . folic acid  1 mg Oral Daily  . furosemide  60 mg Intravenous BID  . insulin aspart  0-9 Units Subcutaneous TID WC  . mupirocin ointment  1 application Nasal BID  . nicotine  14 mg Transdermal Daily  . ondansetron (ZOFRAN) IV  4 mg Intravenous 4 times per day  .  pantoprazole  40 mg Oral BID AC  . phenytoin  50 mg Oral BID  . phosphorus  500 mg Oral BID  . sodium bicarbonate  650 mg Oral TID  . sodium chloride  10-40 mL Intracatheter Q12H  . sodium chloride  3 mL Intravenous Q12H  .  spironolactone  50 mg Oral BID  . thiamine  100 mg Oral Daily  . vancomycin  1,000 mg Intravenous Q24H   Continuous Infusions: . dextrose 5 % and 0.45% NaCl 1,000 mL with potassium chloride 40 mEq, sodium bicarbonate 50 mEq infusion 125 mL/hr at 08/19/15 2144    Assessment and plan:  Principal Problem:   Viral gastroenteritis Active Problems:   Alcohol abuse   Hypokalemia   Thrombocytopenia   End stage liver disease   AKI (acute kidney injury)   Hepatorenal syndrome   Anemia due to other cause   Dehydration   Hypotension   Ascites due to alcoholic hepatitis   Encephalopathy, hepatic   Metabolic acidosis   Cocaine abuse   Tobacco abuse   Seizure disorder   Type 2 diabetes with complication   Normocytic anemia   Hypoalbuminemia   Palliative care encounter   DNR (do not resuscitate) discussion   Protein-calorie malnutrition, severe   GERD (gastroesophageal reflux disease)   1. Nausea, vomiting, and reported diarrhea; abdominal pain. Working diagnosis on admission was viral gastroenteritis, but he was started on treatment for alcoholic gastritis. Of note, the patient underwent a paracentesis in the ED on 8/16 yielding 5.2 L of clear yellowish fluid. The fluid analysis was not consistent with infection. -He  continues to have loose stools. Lactulose was started, but was discontinued on 8/19..  -C. difficile PCR was negative. -He was started on IV Protonix; now changed to the oral route. He was started on scheduled IV Zofran when necessary IV Phenergan. CT scan of abdomen and pelvis on 8/19 revealed -Tense ascites, severe hepatic steatosis, chronic pancreatitis, colonic thickening, large volume GERD, thickened gastric wall. - Protonix was changed to po twice a day. Full liquid diet with patient sitting up after eating as tolerated. -GI consulted for further evaluation of GERD, thickened gastric wall. Await their input.  Hypothermia, etiology not clear, but likely  sepsis. Patient became hypothermic overnight 8/19.  -Studies ordered included CT scan of the abdomen and pelvis with results dictated above. His chest x-ray on 8/18 revealed bibasilar atelectasis.  -His urinalysis revealed many bacteria and 7-10 WBCs, consistent with urinary tract infection. -Blood cultures ordered and results are negative to date. -Peritoneal fluid culture on 8/16 was negative. -Peritoneal fluid culture from 8/20 ordered and is pending. Gram stain reveals only 8 WBCs. -Patient was started on Fortaz and vancomycin empirically on 8/19.  UTI. Antibiotics were started as above. Foley catheter was inserted a few days ago for strict ins and outs. -Urine culture pending.  Probable end-stage liver disease with alcoholic hepatitis/cirrhosis/ascites/hyperbilirubinemia. -Mild hepatic encephalopathy. Patient has a history of multiple hospitalizations for alcoholic hepatitis. He continues to drink despite many conversations with the patient over the years by the hospitalist. Patient reports no intention of stopping. He is wholly noncompliant. -Query hepatorenal syndrome. -His ammonia level was elevated at 122 on admission. He was started on lactulose. His ammonia level has improved. He is still relatively oriented. -Status post paracentesis in the ED yielding 5.2 L of yellowish fluid. Analysis revealed a WBC of 48. Fluid culture negative to date. -The cause patient's CT of abdomen and pelvis revealed tense ascites and the  patient was more symptomatic with increase in work of breathing and abdominal pain, a follow-up paracentesis was ordered on 8/20, yielding 6.5 L of serous fluid. LDH and protein levels consistent with a transudate. Fluid WBC was only 8. -Nephrology started Lasix/albumin infusion along with IV fluids. Aldactone was added and increased to 50 mg twice a day.  -Patient's prognosis overall very poor.  Severe hypoalbuminemia secondary to cirrhosis and severe malnutrition,  causing hypocalcemia. -Patient's severe hypoalbuminemia is secondary to alcoholic liver disease and malnutrition. -Corrected calcium is 8.7. -He received albumen infusion per nephrology  2 days.  Severe hypokalemia. Etiology likely secondary to reported diarrhea, but the patient received a total of 11 runs of IV potassium following admission (which was confirmed by nursing) and his serum potassium has barely improved. His magnesium level was within normal limits. -Subsequently, more IV runs were given and potassium chloride was added orally if tolerated. Aldactone was started empirically. -Nephrology consulted and increased the oral potassium and increased the dose of Aldactone to 50 twice a day. -He was cautiously given more runs of potassium chloride. Potassium chloride was increased and the maintenance IV fluids. -His serum potassium has improved. Continue to monitor his potassium levels in light of IV Lasix started.  Acute kidney injury. The patient's creatinine during the previous hospitalization was within normal limits at 0.84. On admission, it was 3.2. This is presumed to be secondary to prerenal azotemia, query hepatorenal syndrome. -His urine output has been nonoliguric, but could be better. -IV fluids were restarted with potassium chloride added. A Foley catheter was inserted for strict ins and outs. -Renal ultrasound was ordered and revealed mildly echogenic kidneys, consistent with medical renal disease. -Nephrology was consulted.  Aldactone dosing was increased; IV Lasix with dose increased, and started albumin infusion 2 days given. -His renal function has improved, but not at baseline. Further adjustments in IV fluids and Lasix per nephrology.  Metabolic acidosis. -Patient CO2 has fallen. His lactic acid level was slightly above normal range. Oral sodium bicarbonate was started in addition to adding bicarbonate to the IV fluids. Lactulose was discontinued due to the patient's  frequent loose stools. -His CO2 has improved, but not at baseline.   Coagulopathy, Chronic normocytic anemia and thrombocytopenia, likely secondary to alcoholic liver disease/cirrhosis. - Studies orders for evaluation and revealed ferritin 516, vitamin B12 1621. -Patient's INR has been 2.5 to greater than 3. -His hemoglobin has fallen to a nadir of 6.3 and platelets to a nadir of 30. -DIC panel ordered. -We'll transfuse 1 unit of packed red blood cells and 2 units of platelets. -We'll guaiac his stools. -GI consult pending. -Patient's prognosis overall very poor.  Type 2 diabetes mellitus. Patient has been noncompliant with therapy. His CBGs have been reasonable, ranging from 120-170. -Sliding scale NovoLog was started. Hemoglobin A1c was 5.5.  Alcohol and cocaine abuse; tobacco abuse. Patient has been counseled multiple times during previous hospitalizations. He appears to have no intention of stopping. He has a history of leaving AMA during most of not all of the previous hospitalizations. -Palliative care consultation was ordered. Assessment noted and appreciated. -Nevertheless, he was advised and encouraged to stop all 3. -He has some mild tremor, but he does not appear to be in florid alcohol withdrawal. -We'll continue diazepam via the alcohol withdrawal protocol. -We'll continue  thiamine and folate.  Seizure disorder. The patient had been prescribed Dilantin, but he had been noncompliant. We'll continue Dilantin was ordered and started IV. -Dilantin changed to by mouth  and dose decreased to 50 mg daily at bedtime. Seizure disorder is likely secondary to alcohol withdrawal syndrome. He may not need Dilantin indefinitely.  Mildly elevated TSH. Free T4 was within normal limits.  Time spent: 40 minutes.     [facility]  [facility] Pager [phone]. If 7PM-7AM, please contact night-coverage at [email or web address], password [facility] 08/20/2015, 9:00 AM  LOS: 5 days

## 2015-08-20 NOTE — Progress Notes (Signed)
ANTIBIOTIC CONSULT NOTE  Pharmacy Consult for Vancomycin and Fortaz  Indication: Hypothermia  No Known Allergies  Patient Measurements: Height: 6' (182.9 cm) Weight: 141 lb 5 oz (64.1 kg) IBW/kg (Calculated) : 77.6   Vital Signs: Temp: 96.1 F (35.6 C) (08/21 0841) Temp Source: Rectal (08/21 0841) BP: 106/85 mmHg (08/21 0800) Pulse Rate: 105 (08/21 0800) Intake/Output from previous day: 08/20 0701 - 08/21 0700 In: 840 [P.O.:240; I.V.:500; IV Piggyback:100] Out: 550 [Urine:550] Intake/Output from this shift:    Labs:  Recent Labs  08/18/15 0223  08/19/15 0452 08/19/15 0904 08/20/15 0453 08/20/15 0803  WBC 7.9  --  5.2  --  5.5  --   HGB 9.3*  --  7.5*  --  6.3*  --   PLT 67*  --  52*  --  30* 32*  CREATININE 3.06*  < > 2.95* 2.96* 2.46*  --   < > = values in this interval not displayed. Estimated Creatinine Clearance: 32.9 mL/min (by C-G formula based on Cr of 2.46). No results for input(s): VANCOTROUGH, VANCOPEAK, VANCORANDOM, GENTTROUGH, GENTPEAK, GENTRANDOM, TOBRATROUGH, TOBRAPEAK, TOBRARND, AMIKACINPEAK, AMIKACINTROU, AMIKACIN in the last 72 hours.   Microbiology: Recent Results (from the past 720 hour(s))  Culture, body fluid-bottle     Status: None   Collection Time: 08/15/15  1:00 PM  Result Value Ref Range Status   Specimen Description FLUID PERITONEAL COLLECTED BY DOCTOR  Final   Special Requests BOTTLES DRAWN AEROBIC AND ANAEROBIC 10CC  Final   Culture NO GROWTH 5 DAYS  Final   Report Status 08/20/2015 FINAL  Final  Gram stain     Status: None   Collection Time: 08/15/15  1:00 PM  Result Value Ref Range Status   Specimen Description FLUID PERITONEAL COLLECTED BY DOCTOR  Final   Special Requests NONE  Final   Gram Stain   Final    WBC PRESENT,BOTH PMN AND MONONUCLEAR NO ORGANISMS SEEN Performed at [facility]    Report Status 08/15/2015 FINAL  Final  C difficile quick scan w PCR reflex     Status: None   Collection Time: 08/17/15  5:25 PM   Result Value Ref Range Status   C Diff antigen NEGATIVE NEGATIVE Final   C Diff toxin NEGATIVE NEGATIVE Final   C Diff interpretation Negative for toxigenic C. difficile  Final  MRSA PCR Screening     Status: Abnormal   Collection Time: 08/17/15  9:30 PM  Result Value Ref Range Status   MRSA by PCR POSITIVE (A) NEGATIVE Final    Comment:        The GeneXpert MRSA Assay (FDA approved for NASAL specimens only), is one component of a comprehensive MRSA colonization surveillance program. It is not intended to diagnose MRSA infection nor to guide or monitor treatment for MRSA infections. RESULT CALLED TO, READ BACK BY AND VERIFIED WITH: HAMMOCK S AT 2332 ON [id number] BY FORSYTH K   Culture, blood (routine x 2)     Status: None (Preliminary result)   Collection Time: 08/18/15  7:10 PM  Result Value Ref Range Status   Specimen Description BLOOD RIGHT ARM  Final   Special Requests BOTTLES DRAWN AEROBIC AND ANAEROBIC 6CC  Final   Culture NO GROWTH 2 DAYS  Final   Report Status PENDING  Incomplete  Culture, blood (routine x 2)     Status: None (Preliminary result)   Collection Time: 08/18/15  8:02 PM  Result Value Ref Range Status   Specimen Description BLOOD  PIC LINE  Final   Special Requests BOTTLES DRAWN AEROBIC AND ANAEROBIC 6CC  Final   Culture NO GROWTH 2 DAYS  Final   Report Status PENDING  Incomplete  Gram stain     Status: None   Collection Time: 08/19/15  5:13 PM  Result Value Ref Range Status   Specimen Description PERITONEAL  Final   Special Requests NONE  Final   Gram Stain   Final    CYTOSPIN SLIDE WBC PRESENT,BOTH PMN AND MONONUCLEAR NO ORGANISMS SEEN    Report Status 08/19/2015 FINAL  Final  C difficile quick scan w PCR reflex     Status: None   Collection Time: 08/19/15  9:15 PM  Result Value Ref Range Status   C Diff antigen NEGATIVE NEGATIVE Final   C Diff toxin NEGATIVE NEGATIVE Final   C Diff interpretation Negative for toxigenic C. difficile  Final     Anti-infectives    Start     Dose/Rate Route Frequency Ordered Stop   08/20/15 2200  vancomycin (VANCOCIN) IVPB 1000 mg/200 mL premix     1,000 mg 200 mL/hr over 60 Minutes Intravenous Every 24 hours 08/20/15 0852     08/19/15 2200  vancomycin (VANCOCIN) IVPB 750 mg/150 ml premix  Status:  Discontinued     750 mg 150 mL/hr over 60 Minutes Intravenous Every 24 hours 08/18/15 1937 08/20/15 0852   08/18/15 2000  cefTAZidime (FORTAZ) 2 g in dextrose 5 % 50 mL IVPB     2 g 100 mL/hr over 30 Minutes Intravenous Every 24 hours 08/18/15 1926     08/18/15 1930  vancomycin (VANCOCIN) IVPB 1000 mg/200 mL premix     1,000 mg 200 mL/hr over 60 Minutes Intravenous  Once 08/18/15 1926 08/18/15 2108      Assessment: Okay for Protocol,  Poor renal function, but improving.  Micro (-) to date.  Goal of Therapy:  Vancomycin trough level 15-20 mcg/ml  Plan:  Increase Fortaz 2gm IV every 12 hours. Increase Vancomycin 1gm IV every 24 hours. Measure antibiotic drug levels at steady state Follow up culture results  Paul [name], Paul [name] 08/20/2015,8:54 AM

## 2015-08-20 NOTE — Progress Notes (Signed)
CRITICAL VALUE ALERT  Critical value received:  hgb 6.3  Date of notification:  08/20/15  Time of notification:  0610  Critical value read back:Yes.    Nurse who received alert:  Loney Laurence   MD notified (1st page):  Dayna Ramus  Time of first page:  0614  MD notified (2nd page):  Time of second page:  Responding MD:  Dayna Ramus  Time MD responded:  [phone]

## 2015-08-20 NOTE — Progress Notes (Signed)
Subjective: Patient states that he feels much better. No abdominal pain and no nausea or vomting  Objective: Vital signs in last 24 hours: Temp:  [96.3 F (35.7 C)-98.5 F (36.9 C)] 97.2 F (36.2 C) (08/21 0811) Pulse Rate:  [90-105] 105 (08/21 0800) Resp:  [15-26] 22 (08/21 0800) BP: (75-107)/(44-85) 106/85 mmHg (08/21 0800) SpO2:  [86 %-100 %] 100 % (08/21 0800) Weight:  [141 lb 5 oz (64.1 kg)] 141 lb 5 oz (64.1 kg) (08/21 0500)  Intake/Output from previous day: 08/20 0701 - 08/21 0700 In: 840 [P.O.:240; I.V.:500; IV Piggyback:100] Out: 550 [Urine:550] Intake/Output this shift:     Recent Labs  08/18/15 0135 08/18/15 0223 08/19/15 0452 08/20/15 0453  HGB 8.1* 9.3* 7.5* 6.3*    Recent Labs  08/19/15 0452 08/20/15 0453 08/20/15 0803  WBC 5.2 5.5  --   RBC 2.42* 2.03*  --   HCT 22.0* 18.5*  --   PLT 52* 30* 32*    Recent Labs  08/19/15 0904 08/20/15 0453  NA 139 143  K 4.1 3.9  CL 119* 122*  CO2 14* 17*  BUN 19 17  CREATININE 2.96* 2.46*  GLUCOSE 190* 177*  CALCIUM 7.2* 7.4*    Recent Labs  08/20/15 0453 08/20/15 0803  INR 3.12* 2.75*    Generally :Patient is awake and remained confused. Patient is not answering any questions. Chest is clear to auscultation His heart exam regular rate and rhythm Abdomen is distended, nontender Extremities trace edema  Assessment/Plan: Problem #1 acute kidney injury: Most likely prerenal syndrome versus ATN. Renal function is improving. Patient has about 500 cc and is off lasix because of hypotension Problem #2 hypokalemia: His potassium has corrected.  Problem #3 anemia: His hemoglobin is low and declining presently on blood transfussion Problem #4 polysubstance abuse: Cocaine and alcohol Problem #5 gastroenteritis: Patient still with diarrhea but no nausea or vomting Problem #6 alcoholic liver disease: Patient presently with ascites. S?P paracentesis yesterday with 6.5 liters removal Problem #7 protein  calorie malnutrition Problem #8 hypocalcemia: his calcium is very slowly improving Problem #9 hypophosphatemia: Phosphorus is low most likely from his poor nutrition.  Plan: We'll continue his IV potassium supplement 2] We will give lasix 60 mg iv after blood transfusion and bid 6] we'll check his basic metabolic panel in the morning.Marland Kitchen   Shawna Kiener S 08/20/2015, 8:41 AM
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
## 2015-08-21 DIAGNOSIS — K7031 Alcoholic cirrhosis of liver with ascites: Secondary | ICD-10-CM

## 2015-08-21 DIAGNOSIS — K861 Other chronic pancreatitis: Secondary | ICD-10-CM | POA: Diagnosis present

## 2015-08-21 DIAGNOSIS — G40909 Epilepsy, unspecified, not intractable, without status epilepticus: Secondary | ICD-10-CM

## 2015-08-21 DIAGNOSIS — E43 Unspecified severe protein-calorie malnutrition: Secondary | ICD-10-CM

## 2015-08-21 HISTORY — DX: Other chronic pancreatitis: K86.1

## 2015-08-21 LAB — GLUCOSE, CAPILLARY
GLUCOSE-CAPILLARY: 199 mg/dL — AB (ref 65–99)
GLUCOSE-CAPILLARY: 163 mg/dL — AB (ref 65–99)
GLUCOSE-CAPILLARY: 108 mg/dL — AB (ref 65–99)
Glucose-Capillary: 128 mg/dL — ABNORMAL HIGH (ref 65–99)

## 2015-08-21 LAB — TYPE AND SCREEN
ABO/RH(D): B POS
Antibody Screen: NEGATIVE
Unit division: 0

## 2015-08-21 LAB — BASIC METABOLIC PANEL
Anion gap: 3 — ABNORMAL LOW (ref 5–15)
BUN: 14 mg/dL (ref 6–20)
CALCIUM: 7.5 mg/dL — AB (ref 8.9–10.3)
CO2: 20 mmol/L — AB (ref 22–32)
CREATININE: 1.87 mg/dL — AB (ref 0.61–1.24)
Chloride: 120 mmol/L — ABNORMAL HIGH (ref 101–111)
GFR calc non Af Amer: 41 mL/min — ABNORMAL LOW (ref >60)
GFR, EST AFRICAN AMERICAN: 47 mL/min — AB (ref >60)
GLUCOSE: 155 mg/dL — AB (ref 65–99)
Potassium: 3.6 mmol/L (ref 3.5–5.1)
Sodium: 143 mmol/L (ref 135–145)

## 2015-08-21 LAB — CBC
HCT: 22.7 % — ABNORMAL LOW (ref 39.0–52.0)
Hemoglobin: 7.7 g/dL — ABNORMAL LOW (ref 13.0–17.0)
MCH: 30.7 pg (ref 26.0–34.0)
MCHC: 33.9 g/dL (ref 30.0–36.0)
MCV: 90.4 fL (ref 78.0–100.0)
PLATELETS: 83 10*3/uL — AB (ref 150–400)
RBC: 2.51 MIL/uL — ABNORMAL LOW (ref 4.22–5.81)
RDW: 16.3 % — AB (ref 11.5–15.5)
WBC: 6.4 10*3/uL (ref 4.0–10.5)

## 2015-08-21 LAB — AMMONIA: AMMONIA: 44 umol/L — AB (ref 9–35)

## 2015-08-21 LAB — PREPARE PLATELET PHERESIS
UNIT DIVISION: 0
Unit division: 0

## 2015-08-21 LAB — PROTIME-INR
INR: 2.19 — ABNORMAL HIGH (ref 0.00–1.49)
Prothrombin Time: 24.2 seconds — ABNORMAL HIGH (ref 11.6–15.2)

## 2015-08-21 LAB — VANCOMYCIN, TROUGH: Vancomycin Tr: 21 ug/mL — ABNORMAL HIGH (ref 10.0–20.0)

## 2015-08-21 LAB — OCCULT BLOOD X 1 CARD TO LAB, STOOL: Fecal Occult Bld: NEGATIVE

## 2015-08-21 LAB — PREPARE RBC (CROSSMATCH)

## 2015-08-21 MED ORDER — PANCRELIPASE (LIP-PROT-AMYL) 12000-38000 UNITS PO CPEP
12000.0000 [IU] | ORAL_CAPSULE | Freq: Three times a day (TID) | ORAL | Status: DC
  Administered 2015-08-21 – 2015-08-22 (×3): 12000 [IU] via ORAL
  Filled 2015-08-21 (×3): qty 1

## 2015-08-21 NOTE — Progress Notes (Signed)
PT Cancellation Note  Patient Details Name: Paul [name] MRN: [id number] DOB: 03-01-1966   Cancelled Treatment:    Reason Eval/Treat Not Completed: Fatigue/lethargy limiting ability to participate.  Pt was slightly more responsive today but unable to stay awake for me.  He was so weak that he was unable to simply squeeze my hand with either of his hands.  I will try again to see him tomorrow but based on MD and Palliative care notes his prognosis is very poor and the hope of his rehab would also be poor.   Myrlene Broker L 08/21/2015, 11:10 AM
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
## 2015-08-21 NOTE — Clinical Social Work Note (Signed)
CSW met with patient's mother, Kearney Hard and cousins, Mardene Celeste and Lois Huxley.  CSW discussed discharge planning.   Family indicated that they would like to have patient in a SNF upon discharge.  They stated that going back to Mr. [facility] house was not a good choice as patient was not in a Tour manager. Family advised that patient needed to be placed out of the area to prevent friends from bringing him alcohol.   Ms. Hassell Done became emotional when speaking with palliative care.  CSW provided supportive counseling. CSW contacted Adline Potter, regarding patient's Medicaid Application and Disability application.  Ihor Gully, [place]

## 2015-08-21 NOTE — Progress Notes (Addendum)
Subjective: Patient offers no complaint. Somewhat somolent but arousable  Objective: Vital signs in last 24 hours: Temp:  [96.1 F (35.6 C)-98 F (36.7 C)] 97.6 F (36.4 C) (08/22 0509) Pulse Rate:  [84-111] 100 (08/22 0400) Resp:  [10-25] 15 (08/22 0500) BP: (90-120)/(66-93) 90/66 mmHg (08/22 0500) SpO2:  [99 %-100 %] 100 % (08/22 0400) Weight:  [139 lb 12.4 oz (63.4 kg)] 139 lb 12.4 oz (63.4 kg) (08/22 0509)  Intake/Output from previous day: 08/21 0701 - 08/22 0700 In: 3147 [P.O.:420; I.V.:1525; Blood:902; IV Piggyback:300] Out: 4050 [Urine:2700; Stool:1350] Intake/Output this shift:     Recent Labs  08/19/15 0452 08/20/15 0453 08/20/15 1727 08/21/15 0444  HGB 7.5* 6.3* 7.9* 7.7*    Recent Labs  08/20/15 1727 08/21/15 0444  WBC 5.8 6.4  RBC 2.54* 2.51*  HCT 22.8* 22.7*  PLT 86* 83*    Recent Labs  08/20/15 0453 08/21/15 0444  NA 143 143  K 3.9 3.6  CL 122* 120*  CO2 17* 20*  BUN 17 14  CREATININE 2.46* 1.87*  GLUCOSE 177* 155*  CALCIUM 7.4* 7.5*    Recent Labs  08/20/15 0453 08/20/15 0803  INR 3.12* 2.75*    Generally :Patient does not seem to be in any apparent distress Chest is clear to auscultation His heart exam regular rate and rhythm Abdomen is distended, nontender Extremities trace edema  Assessment/Plan: Problem #1 acute kidney injury: Most likely prerenal syndrome versus ATN. Renal function is improving. Patient has about 4000 cc of out put[ 2700 cc of urine] on lasix Problem #2 hypokalemia: His potassium has corrected.  Problem #3 anemia: His hemoglobin is low but stable . S/P blood transfusion yesterday Problem #4 polysubstance abuse: Cocaine and alcohol Problem #5 gastroenteritis: Patient still with diarrhea but no nausea or vomting Problem #6 alcoholic liver disease: Patient presently with ascites.  Problem #7 protein calorie malnutrition Problem #8 hypocalcemia: his calcium is very slowly improving Problem #9  hypophosphatemia: Phosphorus is low most likely from his poor nutrition.  Plan: We'll continue with present treatment 2] d/c lasix 3] we'll check his basic metabolic panel in the morning.Marland Kitchen   Paul [name] S 08/21/2015, 7:23 AM

## 2015-08-21 NOTE — Clinical Documentation Improvement (Signed)
Hospitalist  A cause and effect relationship may not be assumed and must be documented by a provider.  Please clarify the relationship, if any, between UTI and Foley catheter  Are the conditions:   Due to or associated with each other  Unrelated to each other  Other  Clinically Undetermined   Supporting Information  Per progress notes starting 8/20 UTI. Antibiotics were started as above. Foley catheter was inserted a few days ago for strict ins and outs. -Urine culture pending. Component     Latest Ref Rng 08/19/2015  Color, Urine     YELLOW YELLOW  Appearance     CLEAR CLEAR  Specific Gravity, Urine     1.005 - 1.030 1.020  pH     5.0 - 8.0 5.5  Glucose     NEGATIVE mg/dL NEGATIVE  Hgb urine dipstick     NEGATIVE LARGE (A)  Bilirubin Urine     NEGATIVE SMALL (A)  Ketones, ur     NEGATIVE mg/dL NEGATIVE  Protein     NEGATIVE mg/dL NEGATIVE  Urobilinogen, UA     0.0 - 1.0 mg/dL 0.2  Nitrite     NEGATIVE NEGATIVE  Leukocytes, UA     NEGATIVE MODERATE (A)   Treatment IV antibiotics   Please exercise your independent, professional judgment when responding. A specific answer is not anticipated or expected.   Thank You,  Harless Litten [employer] Information Management [place] [phone]

## 2015-08-21 NOTE — Progress Notes (Signed)
OT Cancellation Note  Patient Details Name: DIAMANTE RUBIN MRN: [id number] DOB: [birth year]   Cancelled Treatment:    Reason Eval/Treat Not Completed: Medical issues which prohibited therapy. Physician has recommended and ordered a hospice consult for patient. At this time, due to patient's poor prognosis and declining medical status OT will not evaluate patient. Please order OT if patient's status improves. Thank you for the referral.    Limmie Patricia, OTR/L,CBIS  [phone]  08/21/2015, 9:12 AM

## 2015-08-21 NOTE — Progress Notes (Addendum)
[facility] PROGRESS NOTE  Paul [name] ZOX:[id number] DOB: 1966-04-05 DOA: 08/15/2015 PCP: No PCP Per Patient    Code Status: Full code Family Communication: Discussed with family on 08/20/15 Disposition Plan: Discharge when clinically appropriate   Consultants:  Gastroenterology  Nephrology  Palliative care  Procedures:  Paracentesis 8/20, per IR at [facility], yielding 6.5 L of serous fluid.  PICC 8/18  In the ED, 8/16-paracentesis yielding 5.2 L.  Antibiotics:  Elita Quick 8/20>>  Vancomycin 8/20>>  HPI/Subjective: Patient is sitting up eating breakfast. He has no complaints of headache, chest pain, shortness of breath, nausea, or abdominal pain.  Objective: Filed Vitals:   08/21/15 0739  BP:   Pulse:   Temp: 97.6 F (36.4 C)  Resp:    temperature 97.6. Pulse 100. Respiratory rate 15. Blood pressure 90/66. Oxygen saturation 100% on room air.  Intake/Output Summary (Last 24 hours) at 08/21/15 0825 Last data filed at 08/21/15 0515  Gross per 24 hour  Intake   3047 ml  Output   4050 ml  Net  -1003 ml   Filed Weights   08/19/15 0400 08/20/15 0500 08/21/15 0509  Weight: 71 kg (156 lb 8.4 oz) 64.1 kg (141 lb 5 oz) 63.4 kg (139 lb 12.4 oz)    Exam:   General:  Ill-appearing 49 year old African-American man in no acute distress. He looks a little better this morning.  Cardiovascular: S1, S2, no murmurs rubs or gallops.  Respiratory: Breathing nonlabored. Lungs clear anteriorly, decreased breath sounds in the bases.  Abdomen: Positive bowel sounds, abdomen is significantly less distended and tense compared to several days ago; still with mild ascites. No appreciable tenderness.  Musculoskeletal/extremities: No pedal edema. No acute hot red joints.  Psychiatric/  neurologic: The patient more alert today. He is oriented to himself, president, and hospital. He is not oriented to year or month.  He has mild tremulousness. Cranial nerves II through XII are  grossly intact.  Data Reviewed: Basic Metabolic Panel:  Recent Labs Lab 08/15/15 2310 08/16/15 0426  08/18/15 0223  08/18/15 1738 08/19/15 0452 08/19/15 0904 08/20/15 0453 08/21/15 0444  NA 134* 136  < > 139  < > 137 139 139 143 143  K <2.0* <2.0*  < > 3.0*  < > 3.9 3.7 4.1 3.9 3.6  CL 105 106  < > 116*  < > 118* 119* 119* 122* 120*  CO2 19* 19*  < > 15*  < > 13* 15* 14* 17* 20*  GLUCOSE 176* 151*  < > 116*  < > 146* 153* 190* 177* 155*  BUN 21* 21*  < > 21*  < > CREATININE 2.96* 3.00*  < > 3.06*  < > 3.03* 2.95* 2.96* 2.46* 1.87*  CALCIUM 6.1* 6.3*  < > 6.7*  < > 7.0* 7.1* 7.2* 7.4* 7.5*  MG 2.2 2.3  --  2.1  --   --   --   --   --   --   PHOS  --   --   --   --   --   --  2.1*  --  2.0*  --   < > = values in this interval not displayed. Liver Function Tests:  Recent Labs Lab 08/15/15 1403 08/16/15 0426 08/17/15 0348 08/19/15 1508 08/20/15 0453  AST 49* 49* 45*  --  21  ALT 26 28 32  --  21  ALKPHOS 143* 154* 143*  --  101  BILITOT 2.7*  2.4* 2.1*  --  1.8*  PROT 5.2* 5.3* 4.9*  --  4.5*  ALBUMIN 1.2* 1.3* 1.2* 1.9* 2.2*    Recent Labs Lab 08/15/15 1403  LIPASE 11*    Recent Labs Lab 08/15/15 1403 08/17/15 0939 08/18/15 0927  AMMONIA 122* 62* 87*   CBC:  Recent Labs Lab 08/15/15 1403  08/18/15 0223 08/19/15 0452 08/20/15 0453 08/20/15 0803 08/20/15 1727 08/21/15 0444  WBC 7.0  < > 7.9 5.2 5.5  --  5.8 6.4  NEUTROABS 4.5  --   --   --   --   --   --   --   HGB 8.9*  < > 9.3* 7.5* 6.3*  --  7.9* 7.7*  HCT 25.7*  < > 27.3* 22.0* 18.5*  --  22.8* 22.7*  MCV 88.0  < > 89.8 90.9 91.1  --  89.8 90.4  PLT 81*  < > 67* 52* 30* 32* 86* 83*  < > = values in this interval not displayed. Cardiac Enzymes: No results for input(s): CKTOTAL, CKMB, CKMBINDEX, TROPONINI in the last 168 hours. BNP (last 3 results) No results for input(s): BNP in the last 8760 hours.  ProBNP (last 3 results) No results for input(s): PROBNP in the last 8760  hours.  CBG:  Recent Labs Lab 08/20/15 0730 08/20/15 1143 08/20/15 1620 08/20/15 2223 08/21/15 0733  GLUCAP 144* 117* 116* 257* 128*    Recent Results (from the past 240 hour(s))  Culture, body fluid-bottle     Status: None   Collection Time: 08/15/15  1:00 PM  Result Value Ref Range Status   Specimen Description FLUID PERITONEAL COLLECTED BY DOCTOR  Final   Special Requests BOTTLES DRAWN AEROBIC AND ANAEROBIC 10CC  Final   Culture NO GROWTH 5 DAYS  Final   Report Status 08/20/2015 FINAL  Final  Gram stain     Status: None   Collection Time: 08/15/15  1:00 PM  Result Value Ref Range Status   Specimen Description FLUID PERITONEAL COLLECTED BY DOCTOR  Final   Special Requests NONE  Final   Gram Stain   Final    WBC PRESENT,BOTH PMN AND MONONUCLEAR NO ORGANISMS SEEN Performed at [facility]    Report Status 08/15/2015 FINAL  Final  C difficile quick scan w PCR reflex     Status: None   Collection Time: 08/17/15  5:25 PM  Result Value Ref Range Status   C Diff antigen NEGATIVE NEGATIVE Final   C Diff toxin NEGATIVE NEGATIVE Final   C Diff interpretation Negative for toxigenic C. difficile  Final  MRSA PCR Screening     Status: Abnormal   Collection Time: 08/17/15  9:30 PM  Result Value Ref Range Status   MRSA by PCR POSITIVE (A) NEGATIVE Final    Comment:        The GeneXpert MRSA Assay (FDA approved for NASAL specimens only), is one component of a comprehensive MRSA colonization surveillance program. It is not intended to diagnose MRSA infection nor to guide or monitor treatment for MRSA infections. RESULT CALLED TO, READ BACK BY AND VERIFIED WITH: HAMMOCK S AT 2332 ON [id number] BY FORSYTH K   Culture, blood (routine x 2)     Status: None (Preliminary result)   Collection Time: 08/18/15  7:10 PM  Result Value Ref Range Status   Specimen Description BLOOD RIGHT ARM  Final   Special Requests BOTTLES DRAWN AEROBIC AND ANAEROBIC 6CC  Final   Culture NO GROWTH  2 DAYS  Final   Report Status PENDING  Incomplete  Culture, blood (routine x 2)     Status: None (Preliminary result)   Collection Time: 08/18/15  8:02 PM  Result Value Ref Range Status   Specimen Description BLOOD PIC LINE  Final   Special Requests BOTTLES DRAWN AEROBIC AND ANAEROBIC 6CC  Final   Culture NO GROWTH 2 DAYS  Final   Report Status PENDING  Incomplete  Culture, Urine     Status: None (Preliminary result)   Collection Time: 08/19/15  9:08 AM  Result Value Ref Range Status   Specimen Description URINE, CATHETERIZED  Final   Special Requests Immunocompromised  Final   Culture   Final    TOO YOUNG TO READ Performed at [facility]    Report Status PENDING  Incomplete  Gram stain     Status: None   Collection Time: 08/19/15  5:13 PM  Result Value Ref Range Status   Specimen Description PERITONEAL  Final   Special Requests NONE  Final   Gram Stain   Final    CYTOSPIN SLIDE WBC PRESENT,BOTH PMN AND MONONUCLEAR NO ORGANISMS SEEN    Report Status 08/19/2015 FINAL  Final  C difficile quick scan w PCR reflex     Status: None   Collection Time: 08/19/15  9:15 PM  Result Value Ref Range Status   C Diff antigen NEGATIVE NEGATIVE Final   C Diff toxin NEGATIVE NEGATIVE Final   C Diff interpretation Negative for toxigenic C. difficile  Final     Studies: US Paracentesis  08/20/2015   INDICATION: Ascites, request for paracentesis.  EXAM: ULTRASOUND-GUIDED PARACENTESIS  COMPARISON:  None.  MEDICATIONS: None.  COMPLICATIONS: None immediate  TECHNIQUE: Informed written consent was obtained from the patient after a discussion of the risks, benefits and alternatives to treatment. A timeout was performed prior to the initiation of the procedure.  Initial ultrasound scanning demonstrates a large amount of ascites within the right lower abdominal quadrant. The right lower abdomen was prepped and draped in the usual sterile fashion. 1% lidocaine was used for local anesthesia.  Under  direct ultrasound guidance, a 19 gauge, 7-cm, Yueh catheter was introduced. An ultrasound image was saved for documentation purposed. The paracentesis was performed. The catheter was removed and a dressing was applied. The patient tolerated the procedure well without immediate post procedural complication.  FINDINGS: A total of approximately 6.5 liters of serous fluid was removed. Samples were sent to the laboratory as requested by the clinical team.  IMPRESSION: Successful ultrasound-guided paracentesis yielding 6.5 liters of peritoneal fluid.  Read By:  Pattricia Boss PA-C   Electronically Signed   By: Gilmer Mor D.O.   On: 08/20/2015 11:37    Scheduled Meds: . sodium chloride   Intravenous Once  . cefTAZidime (FORTAZ)  IV  2 g Intravenous Q12H  . Chlorhexidine Gluconate Cloth  6 each Topical Q0600  . folic acid  1 mg Oral Daily  . furosemide  60 mg Intravenous BID  . insulin aspart  0-9 Units Subcutaneous TID WC  . mupirocin ointment  1 application Nasal BID  . nicotine  14 mg Transdermal Daily  . ondansetron (ZOFRAN) IV  4 mg Intravenous 4 times per day  . pantoprazole  40 mg Oral BID AC  . phenytoin  50 mg Oral QHS  . phosphorus  500 mg Oral BID  . phytonadione (VITAMIN K) IV  10 mg Intravenous Daily  . sodium bicarbonate  650  mg Oral TID  . sodium chloride  10-40 mL Intracatheter Q12H  . sodium chloride  3 mL Intravenous Q12H  . spironolactone  50 mg Oral BID  . thiamine  100 mg Oral Daily  . vancomycin  1,000 mg Intravenous Q24H   Continuous Infusions: . 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 Nausea, vomiting, and reported diarrhea; abdominal pain. The patient was started on vigorous IV fluids, IV Protonix, and scheduled IV Zofran and when necessary Phenergan.  He underwent a paracentesis in the ED on 8/16 yielding 5.2 L of clear yellowish fluid. The fluid analysis was not consistent with infection. -He was started on lactulose for hepatic encephalopathy, but it was discontinued on 8/19 when he had frequent, multiple loose stools causing worsening metabolic acidosis and hypokalemia. He continues to have loose stools necessitating a rectal tube.   -C. difficile PCR was negative. GI pathogen panel pending. -Working diagnosis is a viral gastroenteritis. However, he has chronic pancreatitis and possible colitis which could be contributing. Will add pancreatic enzymes with each meal.  CT scan of abdomen and pelvis on 8/19 revealed -Tense ascites, severe hepatic steatosis, chronic pancreatitis, colonic thickening, large volume GERD, thickened gastric wall. - Protonix was changed to po twice a day. -GI was consulted; no endoscopic evaluation was recommended.  Hypothermia, etiology not clear, but likely sepsis. Patient became hypothermic overnight 8/19.  -Studies ordered included CT scan of the abdomen and pelvis with results dictated above. His chest x-ray on 8/18 revealed bibasilar atelectasis.  -His urinalysis revealed many bacteria and 7-10 WBCs, consistent with urinary tract infection. -Blood cultures ordered and results are negative to date. -Peritoneal fluid culture on 8/16 was negative. -Peritoneal fluid culture from 8/20 ordered and is pending. Gram stain revealed only 8  WBCs. -Patient was started on Fortaz and vancomycin empirically on 8/19.  UTI. Antibiotics were started as above. Foley catheter was inserted a few days ago for strict ins and outs. -Urine culture pending.  Probable end-stage liver disease with alcoholic hepatitis/cirrhosis/ascites/hyperbilirubinemia. -Mild hepatic encephalopathy. Patient has a history of multiple hospitalizations for alcoholic hepatitis. He continued to drink despite many conversations with the patient over the years by the hospitalists. Patient reported no intention of stopping. He is wholly noncompliant. -Query hepatorenal syndrome. -His ammonia level was elevated at 122 on admission. He was started on lactulose. His ammonia level has improved. He is still slightly encephalopathic. -Status post paracentesis in the ED yielding 5.2 L of yellowish fluid. Analysis revealed a WBC of 48. Fluid culture negative to date. - Another paracentesis was done on 8/20, yielding 6.5 L of serous fluid. LDH and protein levels were consistent with a transudate. Fluid WBC was only 8. Fluid culture pending. -Nephrology started Lasix/albumin infusion along with  IV fluids. Aldactone was added and increased to 50 mg twice a day.  -Gastroenterology was consulted. Per Dr. Darrick Penna, the patient's MELD score is 31 and has a Child Pugh C. She added that the patient's prognosis is very poor-agreed. His poor prognosis was discussed with his family on 8/21. Palliative care was consulted, but the patient is still full code. -Vitamin K 10 mg IV daily 3 days ordered by Dr. Darrick Penna.  Severe hypoalbuminemia secondary to cirrhosis and severe malnutrition, causing hypocalcemia. -Patient's severe hypoalbuminemia is secondary to alcoholic liver disease and malnutrition. -Corrected calcium is 8.7. -He received albumen infusion per nephrology  2 days.  Severe hypokalemia. Etiology likely secondary to reported diarrhea, but the patient received a total of 11 runs of  IV potassium following admission. His magnesium level was within normal limits. -Subsequently, more IV runs were given and potassium chloride was added orally as tolerated. Potassium was added to maintenance IV fluids. Aldactone was started empirically. -Nephrology consulted and increased the oral potassium and increased the dose of Aldactone to 50 twice a day. -His serum potassium has improved progressively.  Acute kidney injury. The patient's creatinine during the previous hospitalization was within normal limits at 0.84. On admission, it was 3.2. This is presumed to be secondary to prerenal azotemia, query hepatorenal syndrome. -Patient was started on vigorous IV fluids. Nephrology was consulted and adjusted the IV fluids and added IV Lasix. A Foley catheter was placed for strict ins and outs. The patient is urine output was barely above nonoliguric, so nephrology ordered albumin infusion 2 days with increased IV Lasix. -Renal ultrasound was ordered and revealed mildly echogenic kidneys, consistent with medical renal disease. -Over the past 24 hours the patient's urine output has increased significantly; his creatinine is now below 2. Nephrology discontinued the Lasix.  Metabolic acidosis. -Patient CO2 has fallen. His lactic acid level was slightly above normal range. Oral sodium bicarbonate was started in addition to adding bicarbonate to the IV fluids. Lactulose was discontinued due to the patient's frequent loose stools. -His CO2 has improved.   Coagulopathy, Chronic normocytic anemia and thrombocytopenia, likely secondary to alcoholic liver disease/cirrhosis. - Studies orders for evaluation and revealed ferritin 516, vitamin B12 1621. -Patient's INR has been 2.5 to greater than 3. -His hemoglobin had fallen to a nadir of 6.3 and platelets to a nadir of 30. -DIC panel ordered, but no schistocytes were seen. -Patient was transfused 1 unit of packed red blood cells and 2 units of platelets  on 08/20/15. Both his hemoglobin and platelet count have improved. -HIV serology ordered and is pending. -Etiology likely secondary to acute and chronic disease with associated infection.  Type 2 diabetes mellitus. Patient has been noncompliant with therapy. His CBGs have been reasonable, ranging from 120-170. -Sliding scale NovoLog was started. Hemoglobin A1c was 5.5.  Alcohol and cocaine abuse; tobacco abuse. Patient has been counseled multiple times during previous hospitalizations. He appears to have no intention of stopping. He has a history of leaving AMA during most of not all of the previous hospitalizations. -Palliative care consultation was ordered. Assessment noted and appreciated. -Nevertheless, he was advised and encouraged to stop all 3. -He has some mild tremor, but he does not appear to be severe alcohol withdrawal. -We'll continue diazepam via the alcohol withdrawal protocol. -We'll continue  thiamine and folate.  Seizure disorder. The patient had been prescribed Dilantin, but he had been noncompliant. We'll continue Dilantin was ordered and started IV. -Dilantin changed to by mouth and dose decreased  to 50 mg daily at bedtime. Seizure disorder is likely secondary to alcohol withdrawal syndrome. He may not need Dilantin indefinitely.  Mildly elevated TSH. Free T4 was within normal limits.  Very poor prognosis.  This was noted also by GI. Palliative care consultation was ordered by the admitting physician. On 08/20/15, I had a discussion with the family including his mother, 2 brothers, and close cousin. (See detailed addendum on 08/20/2015). I gently recommended hospice and DO NOT RESUSCITATE status if patient continued to deteriorate. The family wanted to discuss CODE STATUS further among themselves and depending on the patient's clinical status over the weekend, they will eventually decide. -Patient appears to be slightly clinically improved, but still with very poor  prognosis.   Time spent: 35 minutes.     [facility]  [facility] Pager [phone]. If 7PM-7AM, please contact night-coverage at [email or web address], password [facility] 08/21/2015, 8:25 AM  LOS: 6 days

## 2015-08-21 NOTE — Progress Notes (Addendum)
Subjective: Reports abdominal pain, vague. No N/V. Per nursing staff, rectal tube emptied over night. "filled up a bag". Alert to person, place, and year. Drowsy.   Objective: Vital signs in last 24 hours: Temp:  [96.1 F (35.6 C)-98 F (36.7 C)] 97.6 F (36.4 C) (08/22 0739) Pulse Rate:  [84-111] 100 (08/22 0400) Resp:  [10-25] 15 (08/22 0500) BP: (90-120)/(66-93) 90/66 mmHg (08/22 0500) SpO2:  [99 %-100 %] 100 % (08/22 0400) Weight:  [139 lb 12.4 oz (63.4 kg)] 139 lb 12.4 oz (63.4 kg) (08/22 0509) Last BM Date: 08/20/15 (flexi seal ) General:   Resting with eyes closed but arouses to verbal stimuli. Head:  Normocephalic and atraumatic. Abdomen:  Bowel sounds present, distended abdomen, moderately tense ascites.  No rebound or guarding.  Msk:  Muscle wasting bilateral lower extremities  Extremities:  Mild pedal edema  Neurologic:  Oriented to person, place, year. Unable to assess asterixis as does not follow commands. Resting at time of visit but opens eyes to verbal stimuli. Quickly closes eyes after questioning.  Psych:  Flat affect. Cooperative only in answering short questions.   Intake/Output from previous day: 08/21 0701 - 08/22 0700 In: 3147 [P.O.:420; I.V.:1525; Blood:902; IV Piggyback:300] Out: 4050 [Urine:2700; Stool:1350] Intake/Output this shift:    Lab Results:  Recent Labs  08/20/15 0453 08/20/15 0803 08/20/15 1727 08/21/15 0444  WBC 5.5  --  5.8 6.4  HGB 6.3*  --  7.9* 7.7*  HCT 18.5*  --  22.8* 22.7*  PLT 30* 32* 86* 83*   BMET  Recent Labs  08/19/15 0904 08/20/15 0453 08/21/15 0444  NA 139 143 143  K 4.1 3.9 3.6  CL 119* 122* 120*  CO2 14* 17* 20*  GLUCOSE 190* 177* 155*  BUN 19 17 14   CREATININE 2.96* 2.46* 1.87*  CALCIUM 7.2* 7.4* 7.5*   LFT  Recent Labs  08/19/15 1508 08/20/15 0453  PROT  --  4.5*  ALBUMIN 1.9* 2.2*  AST  --  21  ALT  --  21  ALKPHOS  --  101  BILITOT  --  1.8*   PT/INR  Recent Labs  08/20/15 0453  08/20/15 0803  LABPROT 31.5* 28.7*  INR 3.12* 2.75*   Lab Results  Component Value Date   FERRITIN 516* 08/17/2015     Studies/Results: US Paracentesis  08/20/2015   INDICATION: Ascites, request for paracentesis.  EXAM: ULTRASOUND-GUIDED PARACENTESIS  COMPARISON:  None.  MEDICATIONS: None.  COMPLICATIONS: None immediate  TECHNIQUE: Informed written consent was obtained from the patient after a discussion of the risks, benefits and alternatives to treatment. A timeout was performed prior to the initiation of the procedure.  Initial ultrasound scanning demonstrates a large amount of ascites within the right lower abdominal quadrant. The right lower abdomen was prepped and draped in the usual sterile fashion. 1% lidocaine was used for local anesthesia.  Under direct ultrasound guidance, a 19 gauge, 7-cm, Yueh catheter was introduced. An ultrasound image was saved for documentation purposed. The paracentesis was performed. The catheter was removed and a dressing was applied. The patient tolerated the procedure well without immediate post procedural complication.  FINDINGS: A total of approximately 6.5 liters of serous fluid was removed. Samples were sent to the laboratory as requested by the clinical team.  IMPRESSION: Successful ultrasound-guided paracentesis yielding 6.5 liters of peritoneal fluid.  Read By:  Pattricia Boss PA-C   Electronically Signed   By: Gilmer Mor D.O.   On: 08/20/2015 11:37  Assessment: 49 year old male admitted with hypothermia, hypokalemia, hypotension, acute renal failure, and decompensated liver disease in the setting of ETOH cirrhosis and diarrhea. Poor overall prognosis. Renal function improving but INR trend worsening. Needs repeat INR today. Discussion with family regarding DNR status and consideration  of hospice by attending yesterday but no formal decision. Will be reevaluated in next few days.   Diarrhea: negative Cdiff. Likely multifactorial. Persistent. CT on  file from 8/20 with colonic thickening likely from underdistension and portal colopathy, unable to exclude infectious colitis. Antibiotics per pharmacy. Check GI pathogen panel.   Encephalopathy: improving. Lactulose on hold due to diarrhea.   Ascites: abdomen moderately tense. Anticipate repeat paracentesis in next 24 hours (prior LVAP 8/16 and 8/21).   Anemia: without overt GI bleeding. Heme negative. Transfuse prn.   Cirrhosis: decompensated, end-stage liver disease. Non-compliance with ETOH cessation. Recheck INR now. Palliative care consult requested. DNR status still in discussion with family by attending.   Plan: Check INR now Continue to hold lactulose Check GI pathogen panel Likely paracentesis in next 24 hours or so  Follow H/H, transfuse prn. Monitor for overt bleeding Poor prognosis: palliative care consult Will continue to follow with you  Nira Retort, ANP-BC [facility]    LOS: 6 days jk   08/21/2015, 7:58 AM   Attending note:  Patient seen and examined in ICU bed 9. Review GI issues with patient's mother who accompanies him today. I agree with above assessment and recommendations as outlined. I fully agree with empiric trial of pancreatic enzyme supplements as instituted by Dr. Sherrie Mustache.  It is worth a try.

## 2015-08-22 ENCOUNTER — Encounter (HOSPITAL_COMMUNITY): Payer: Self-pay | Admitting: Internal Medicine

## 2015-08-22 DIAGNOSIS — F1027 Alcohol dependence with alcohol-induced persisting dementia: Secondary | ICD-10-CM | POA: Diagnosis present

## 2015-08-22 DIAGNOSIS — K860 Alcohol-induced chronic pancreatitis: Secondary | ICD-10-CM

## 2015-08-22 HISTORY — DX: Alcohol dependence with alcohol-induced persisting dementia: F10.27

## 2015-08-22 LAB — OTHER BODY FLUID CHEMISTRY

## 2015-08-22 LAB — BASIC METABOLIC PANEL
Anion gap: 5 (ref 5–15)
BUN: 13 mg/dL (ref 6–20)
CHLORIDE: 116 mmol/L — AB (ref 101–111)
CO2: 22 mmol/L (ref 22–32)
CREATININE: 1.6 mg/dL — AB (ref 0.61–1.24)
Calcium: 7 mg/dL — ABNORMAL LOW (ref 8.9–10.3)
GFR calc Af Amer: 57 mL/min — ABNORMAL LOW (ref >60)
GFR calc non Af Amer: 49 mL/min — ABNORMAL LOW (ref >60)
Glucose, Bld: 125 mg/dL — ABNORMAL HIGH (ref 65–99)
Potassium: 4.2 mmol/L (ref 3.5–5.1)
SODIUM: 143 mmol/L (ref 135–145)

## 2015-08-22 LAB — CBC
HEMATOCRIT: 24.9 % — AB (ref 39.0–52.0)
Hemoglobin: 8.5 g/dL — ABNORMAL LOW (ref 13.0–17.0)
MCH: 30.8 pg (ref 26.0–34.0)
MCHC: 34.1 g/dL (ref 30.0–36.0)
MCV: 90.2 fL (ref 78.0–100.0)
Platelets: 62 10*3/uL — ABNORMAL LOW (ref 150–400)
RBC: 2.76 MIL/uL — ABNORMAL LOW (ref 4.22–5.81)
RDW: 16.4 % — AB (ref 11.5–15.5)
WBC: 6.1 10*3/uL (ref 4.0–10.5)

## 2015-08-22 LAB — GLUCOSE, CAPILLARY
GLUCOSE-CAPILLARY: 129 mg/dL — AB (ref 65–99)
Glucose-Capillary: 148 mg/dL — ABNORMAL HIGH (ref 65–99)
Glucose-Capillary: 201 mg/dL — ABNORMAL HIGH (ref 65–99)
Glucose-Capillary: 124 mg/dL — ABNORMAL HIGH (ref 65–99)

## 2015-08-22 LAB — PROTIME-INR
INR: 2.25 — AB (ref 0.00–1.49)
PROTHROMBIN TIME: 24.6 s — AB (ref 11.6–15.2)

## 2015-08-22 LAB — URINE CULTURE: Culture: >=100000

## 2015-08-22 LAB — AMMONIA: Ammonia: 38 umol/L — ABNORMAL HIGH (ref 9–35)

## 2015-08-22 LAB — OCCULT BLOOD X 1 CARD TO LAB, STOOL: FECAL OCCULT BLD: NEGATIVE

## 2015-08-22 LAB — HIV ANTIBODY (ROUTINE TESTING W REFLEX): HIV Screen 4th Generation wRfx: NONREACTIVE

## 2015-08-22 LAB — PATHOLOGIST SMEAR REVIEW

## 2015-08-22 MED ORDER — SODIUM BICARBONATE 650 MG PO TABS
650.0000 mg | ORAL_TABLET | Freq: Two times a day (BID) | ORAL | Status: DC
  Administered 2015-08-22 – 2015-08-23 (×4): 650 mg via ORAL
  Filled 2015-08-22 (×5): qty 1

## 2015-08-22 MED ORDER — ENSURE ENLIVE PO LIQD
237.0000 mL | Freq: Two times a day (BID) | ORAL | Status: DC
  Administered 2015-08-23 – 2015-08-25 (×4): 237 mL via ORAL

## 2015-08-22 MED ORDER — PANCRELIPASE (LIP-PROT-AMYL) 36000-114000 UNITS PO CPEP
36000.0000 [IU] | ORAL_CAPSULE | Freq: Three times a day (TID) | ORAL | Status: DC
  Administered 2015-08-22 – 2015-08-24 (×4): 36000 [IU] via ORAL
  Filled 2015-08-22 (×9): qty 1

## 2015-08-22 MED ORDER — VANCOMYCIN HCL IN DEXTROSE 750-5 MG/150ML-% IV SOLN
750.0000 mg | INTRAVENOUS | Status: DC
  Administered 2015-08-22 – 2015-08-24 (×3): 750 mg via INTRAVENOUS
  Filled 2015-08-22 (×4): qty 150

## 2015-08-22 NOTE — Clinical Social Work Note (Addendum)
CSW spoke to patient's cousin, Paul [name] [phone]), to discuss discharge planning.  CSW discussed that due to patient's Medicaid being in pending status an accepting SNF was not an option.  Ms. Paul [name] advised that if patient's Medicaid was not approved prior to his discharge patient could come and reside with her at her home at [address], [place], [place] [address].  She stated that there were not other options to be considered as Mr. Paul [name] lived in an unsanitary situation.   She also advised that patient's brothers worked schedules would not allow for patient to come live with them.  Paul [name] stated that patient's mother has her own health issues that were inhibit her ability to provide care.  Paul [name] advised that if a SNF would not accept patient, she was willing to take him in her home.     Paul [name], [place] [phone]

## 2015-08-22 NOTE — Care Management Note (Signed)
Case Management Note  Patient Details  Name: AVIS MCMAHILL MRN: [id number] Date of Birth: [birth year]  Expected Discharge Date:                  Expected Discharge Plan:  Home/Self Care  In-House Referral:  Clinical Social Work, Museum/gallery exhibitions officer  CM Consult  Post Acute Care Choice:  NA Choice offered to:  NA  DME Arranged:    DME Agency:     HH Arranged:    HH Agency:     Status of Service:  In process, will continue to follow  Medicare Important Message Given:    Date Medicare IM Given:    Medicare IM give by:    Date Additional Medicare IM Given:    Additional Medicare Important Message give by:     If discussed at Long Length of Stay Meetings, dates discussed:  08/22/2015  Additional Comments: Per [facility] Medicaid should be active by the end of the week. CSW has been working with pt's cousin who says patient can come stay with her. Pt will not be eligible for [facility] with his PCP being through the health dept. Pt is appropriate for hospice in the home. CSW and Palliative care will continue discussions with family. CM will cont to follow for DC planning and needs.  Malcolm Metro, RN 08/22/2015, 3:43 PM

## 2015-08-22 NOTE — Progress Notes (Signed)
PT IS ALERT. KNOWS WHO HE IS AND WHERE HE IS.REMAINS AGITATED AND CURSING. RT DUAL LUMEN PICC LINE PATENT. FOLEY CATH PATENT DRAINING CLEAR AMBER URINE. FLEXISEAL IN PLACE DRAINING LOOSE GREEN STOOL. NO NAUSEA OR VOMITING.  HR  100'S IN ST. TRANSFERRING TO ROOM 319. REPORT CALLED TO JESICCA MAYS RN.

## 2015-08-22 NOTE — Progress Notes (Signed)
ANTIBIOTIC CONSULT NOTE  Pharmacy Consult for Vancomycin and Fortaz  Indication: Hypothermia / UTI  No Known Allergies  Patient Measurements: Height: 6' (182.9 cm) Weight: 141 lb 15.6 oz (64.4 kg) IBW/kg (Calculated) : 77.6  Vital Signs: Temp: 98.3 F (36.8 C) (08/23 0727) Temp Source: Rectal (08/23 0727) BP: 111/78 mmHg (08/23 0900) Pulse Rate: 100 (08/23 0900) Intake/Output from previous day: 08/22 0701 - 08/23 0700 In: 3648.8 [P.O.:360; I.V.:3238.8; IV Piggyback:50] Out: 2950 [Urine:2450; Stool:500] Intake/Output from this shift: Total I/O In: 730 [P.O.:480; I.V.:250] Out: -   Labs:  Recent Labs  08/20/15 0453  08/20/15 1727 08/21/15 0444 08/22/15 0515  WBC 5.5  --  5.8 6.4 6.1  HGB 6.3*  --  7.9* 7.7* 8.5*  PLT 30*  < > 86* 83* 62*  CREATININE 2.46*  --   --  1.87* 1.60*  < > = values in this interval not displayed. Estimated Creatinine Clearance: 50.9 mL/min (by C-G formula based on Cr of 1.6).  Recent Labs  08/21/15 2045  [facility] 21*    Microbiology: Recent Results (from the past 720 hour(s))  Culture, body fluid-bottle     Status: None   Collection Time: 08/15/15  1:00 PM  Result Value Ref Range Status   Specimen Description FLUID PERITONEAL COLLECTED BY DOCTOR  Final   Special Requests BOTTLES DRAWN AEROBIC AND ANAEROBIC 10CC  Final   Culture NO GROWTH 5 DAYS  Final   Report Status 08/20/2015 FINAL  Final  Gram stain     Status: None   Collection Time: 08/15/15  1:00 PM  Result Value Ref Range Status   Specimen Description FLUID PERITONEAL COLLECTED BY DOCTOR  Final   Special Requests NONE  Final   Gram Stain   Final    WBC PRESENT,BOTH PMN AND MONONUCLEAR NO ORGANISMS SEEN Performed at [facility]    Report Status 08/15/2015 FINAL  Final  C difficile quick scan w PCR reflex     Status: None   Collection Time: 08/17/15  5:25 PM  Result Value Ref Range Status   C Diff antigen NEGATIVE NEGATIVE Final   C Diff toxin NEGATIVE  NEGATIVE Final   C Diff interpretation Negative for toxigenic C. difficile  Final  MRSA PCR Screening     Status: Abnormal   Collection Time: 08/17/15  9:30 PM  Result Value Ref Range Status   MRSA by PCR POSITIVE (A) NEGATIVE Final    Comment:        The GeneXpert MRSA Assay (FDA approved for NASAL specimens only), is one component of a comprehensive MRSA colonization surveillance program. It is not intended to diagnose MRSA infection nor to guide or monitor treatment for MRSA infections. RESULT CALLED TO, READ BACK BY AND VERIFIED WITH: HAMMOCK S AT 2332 ON [id number] BY FORSYTH K   Culture, blood (routine x 2)     Status: None (Preliminary result)   Collection Time: 08/18/15  7:10 PM  Result Value Ref Range Status   Specimen Description BLOOD RIGHT ARM  Final   Special Requests BOTTLES DRAWN AEROBIC AND ANAEROBIC 6CC  Final   Culture NO GROWTH 3 DAYS  Final   Report Status PENDING  Incomplete  Culture, blood (routine x 2)     Status: None (Preliminary result)   Collection Time: 08/18/15  8:02 PM  Result Value Ref Range Status   Specimen Description BLOOD PIC LINE  Final   Special Requests BOTTLES DRAWN AEROBIC AND ANAEROBIC 6CC  Final  Culture NO GROWTH 3 DAYS  Final   Report Status PENDING  Incomplete  Culture, Urine     Status: None (Preliminary result)   Collection Time: 08/19/15  9:08 AM  Result Value Ref Range Status   Specimen Description URINE, CATHETERIZED  Final   Special Requests Immunocompromised  Final   Culture   Final    >=100,000 COLONIES/mL ENTEROCOCCUS SPECIES Performed at [facility]    Report Status PENDING  Incomplete  Culture, body fluid-bottle     Status: None (Preliminary result)   Collection Time: 08/19/15  5:13 PM  Result Value Ref Range Status   Specimen Description PERITONEAL  Final   Special Requests NONE  Final   Culture NO GROWTH 2 DAYS  Final   Report Status PENDING  Incomplete  Gram stain     Status: None   Collection Time:  08/19/15  5:13 PM  Result Value Ref Range Status   Specimen Description PERITONEAL  Final   Special Requests NONE  Final   Gram Stain   Final    CYTOSPIN SLIDE WBC PRESENT,BOTH PMN AND MONONUCLEAR NO ORGANISMS SEEN    Report Status 08/19/2015 FINAL  Final  C difficile quick scan w PCR reflex     Status: None   Collection Time: 08/19/15  9:15 PM  Result Value Ref Range Status   C Diff antigen NEGATIVE NEGATIVE Final   C Diff toxin NEGATIVE NEGATIVE Final   C Diff interpretation Negative for toxigenic C. difficile  Final    Anti-infectives    Start     Dose/Rate Route Frequency Ordered Stop   08/22/15 1200  vancomycin (VANCOCIN) IVPB 750 mg/150 ml premix     750 mg 150 mL/hr over 60 Minutes Intravenous Every 24 hours 08/22/15 1040     08/20/15 2200  vancomycin (VANCOCIN) IVPB 1000 mg/200 mL premix  Status:  Discontinued     1,000 mg 200 mL/hr over 60 Minutes Intravenous Every 24 hours 08/20/15 0852 08/22/15 1040   08/20/15 1200  cefTAZidime (FORTAZ) 2 g in dextrose 5 % 50 mL IVPB     2 g 100 mL/hr over 30 Minutes Intravenous Every 12 hours 08/20/15 0859     08/19/15 2200  vancomycin (VANCOCIN) IVPB 750 mg/150 ml premix  Status:  Discontinued     750 mg 150 mL/hr over 60 Minutes Intravenous Every 24 hours 08/18/15 1937 08/20/15 0852   08/18/15 2000  cefTAZidime (FORTAZ) 2 g in dextrose 5 % 50 mL IVPB  Status:  Discontinued     2 g 100 mL/hr over 30 Minutes Intravenous Every 24 hours 08/18/15 1926 08/20/15 0859   08/18/15 1930  vancomycin (VANCOCIN) IVPB 1000 mg/200 mL premix     1,000 mg 200 mL/hr over 60 Minutes Intravenous  Once 08/18/15 1926 08/18/15 2108     Assessment: Okay for Protocol,  Poor renal function on admission with elevated SCr, but improving.  Trough level is above target. Urine cx w/ > 100K colonies Enterococcus.    Estimated PK Parameters: --------------------------- New rate constant (kel): 0.032 hr-1 New half-life: 21.66 Hours New Vd from levels: 45.08   Liters  (0.7 L/kg)  Estimated New Dose and Interval --------------------------- Recommended dose:  849.8 mg Recommended interval: 23.6 Hrs  Patient response:  Patient is responding to treatment  Recommendations: ==================== Give Vancomycin  750 mg  q 24 hrs. Infuse over 1 hrs Expected Ctrough: 14.6 mcg/ml  Recommended labs and intervals: Measure Bun and Scr 3 times/week.  Goal of  Therapy:  Vancomycin trough level 15-20 mcg/ml  Plan:  Continue Fortaz 2gm IV every 12 hours. Decrease Vancomycin to 750mg  IV every 24 hours. Measure antibiotic drug levels at steady state Follow up culture results - c/s  Valrie Hart A 08/22/2015,10:41 AM

## 2015-08-22 NOTE — Progress Notes (Signed)
Subjective: Denies abdominal pain. No N/V. Difficult to engage in conversation. Family at bedside. States he WANTS to quit drinking when asked directly. Per nursing staff, rectal output about 1 liter overnight but "slacked off" this morning.   Objective: Vital signs in last 24 hours: Temp:  [97.9 F (36.6 C)-98.9 F (37.2 C)] 98.3 F (36.8 C) (08/23 0727) Pulse Rate:  [97-107] 101 (08/23 0600) Resp:  [14-24] 16 (08/23 0600) BP: (88-113)/(56-87) 110/87 mmHg (08/23 0600) SpO2:  [95 %-100 %] 100 % (08/23 0600) FiO2 (%):  [21 %] 21 % (08/23 0400) Weight:  [141 lb 15.6 oz (64.4 kg)] 141 lb 15.6 oz (64.4 kg) (08/23 0600) Last BM Date: 08/21/15 General:   Alert and oriented, flat affect, answers with yes or no.  Head:  Normocephalic and atraumatic. Eyes:  No icterus, sclera clear.  Abdomen:  Bowel sounds present, less distended and tense than yesterday, no TTP, no rebound or guarding.  Extremities:  Mild pedal edema  Neurologic:  Alert and  oriented to person and place. Does not know the year or month.  Psych:  Flat affect. Difficult to engage in conversation. Wants to be left alone.   Intake/Output from previous day: 08/22 0701 - 08/23 0700 In: 3648.8 [P.O.:360; I.V.:3238.8; IV Piggyback:50] Out: 2950 [Urine:2450; Stool:500] Intake/Output this shift:    Lab Results:  Recent Labs  08/20/15 1727 08/21/15 0444 08/22/15 0515  WBC 5.8 6.4 6.1  HGB 7.9* 7.7* 8.5*  HCT 22.8* 22.7* 24.9*  PLT 86* 83* 62*   BMET  Recent Labs  08/20/15 0453 08/21/15 0444 08/22/15 0515  NA 143 143 143  K 3.9 3.6 4.2  CL 122* 120* 116*  CO2 17* 20* 22  GLUCOSE 177* 155* 125*  BUN CREATININE 2.46* 1.87* 1.60*  CALCIUM 7.4* 7.5* 7.0*   LFT  Recent Labs  08/19/15 1508 08/20/15 0453  PROT  --  4.5*  ALBUMIN 1.9* 2.2*  AST  --  21  ALT  --  21  ALKPHOS  --  101  BILITOT  --  1.8*   PT/INR  Recent Labs  08/20/15 0803 08/21/15 0932  LABPROT 28.7* 24.2*  INR 2.75*  2.19*     Assessment: 49 year old male admitted with hypothermia, hypokalemia, hypotension, acute renal failure, and decompensated liver disease in the setting of ETOH cirrhosis and diarrhea. Poor overall prognosis. Renal function and INR improving. Needs repeat INR today.   Diarrhea: negative Cdiff. Likely multifactorial. Persistent. CT on file from 8/20 with colonic thickening likely from underdistension and portal colopathy, unable to exclude infectious colitis. Antibiotics per pharmacy. GI pathogen panel in process. Query pancreatic insufficiency with history of chronic pancreatitis. Agree with pancreatic enzyme therapy started yesterday and agree with increasing to 36,000 units with meals, which will be weight-based dosing.   Encephalopathy: improved. Lactulose on hold due to diarrhea.   Ascites: abdomen less tense than yesterday. Monitor possible need for repeat paracentesis (prior LVAP 8/16 and 8/21).   Anemia: without overt GI bleeding. Heme negative. Transfuse prn.   Cirrhosis: decompensated, end-stage liver disease. Non-compliance with ETOH cessation. Recheck INR today. Appreciate palliative care consult. DNR status now noted.  Abnormal CT: reveals thickened gastric wall, likely secondary to gastritis. If patient were to improve clinically, could evaluate endoscopically. No prior colonoscopy, which would be for screening purposes and evaluation of colonic thickening on CT 8/20. Would need improvement in clinical status prior to invasive procedures.     Plan: Recheck INR today Follow-up  on pending GI pathogen panel Agree with Creon  Agree with advancement to dysphagia diet  Consider endoscopic evaluation of gastric thickening and colonic thickening on CT if were to significantly improve from a clinical standpoint,but overall prognosis remains poor.   Nira Retort, ANP-BC [facility]      LOS: 7 days    08/22/2015, 8:04 AM

## 2015-08-22 NOTE — Progress Notes (Signed)
Paul [name]  MRN: [id number]  DOB/AGE: 06/09/1966 49 y.o.  Primary Care Physician:No PCP Per Patient  Admit date: 08/15/2015  Chief Complaint:  Chief Complaint  Patient presents with  . Emesis  . Diarrhea  . Abdominal Pain    S-Pt presented on  08/15/2015 with  Chief Complaint  Patient presents with  . Emesis  . Diarrhea  . Abdominal Pain  .    Pt today feels better.pt no new complaints.  Meds . sodium chloride   Intravenous Once  . cefTAZidime (FORTAZ)  IV  2 g Intravenous Q12H  . folic acid  1 mg Oral Daily  . insulin aspart  0-9 Units Subcutaneous TID WC  . lipase/protease/amylase  12,000 Units Oral TID WC  . mupirocin ointment  1 application Nasal BID  . nicotine  14 mg Transdermal Daily  . ondansetron (ZOFRAN) IV  4 mg Intravenous 4 times per day  . pantoprazole  40 mg Oral BID AC  . phenytoin  50 mg Oral QHS  . phosphorus  500 mg Oral BID  . phytonadione (VITAMIN K) IV  10 mg Intravenous Daily  . sodium bicarbonate  650 mg Oral TID  . sodium chloride  10-40 mL Intracatheter Q12H  . sodium chloride  3 mL Intravenous Q12H  . spironolactone  50 mg Oral BID  . thiamine  100 mg Oral Daily  . vancomycin  1,000 mg Intravenous Q24H      Physical Exam: Vital signs in last 24 hours: Temp:  [97.9 F (36.6 C)-98.9 F (37.2 C)] 98.3 F (36.8 C) (08/23 0727) Pulse Rate:  [97-107] 101 (08/23 0600) Resp:  [14-24] 16 (08/23 0600) BP: (88-113)/(56-87) 110/87 mmHg (08/23 0600) SpO2:  [95 %-100 %] 100 % (08/23 0600) FiO2 (%):  [21 %] 21 % (08/23 0400) Weight:  [141 lb 15.6 oz (64.4 kg)] 141 lb 15.6 oz (64.4 kg) (08/23 0600) Weight change: 2 lb 3.3 oz (1 kg) Last BM Date: 08/21/15  Intake/Output from previous day: 08/22 0701 - 08/23 0700 In: 3523.8 [P.O.:360; I.V.:3113.8; IV Piggyback:50] Out: 2950 [Urine:2450; Stool:500]     Physical Exam: General- pt is awake,alert, follows commands. Resp- No acute REsp distress, CTA B/L NO Rhonchi CVS- S1S2 regular in  rate and rhythm GIT- BS+, soft, NT. EXT- NO LE Edema, NO Cyanosis   Lab Results: CBC  Recent Labs  08/21/15 0444 08/22/15 0515  WBC 6.4 6.1  HGB 7.7* 8.5*  HCT 22.7* 24.9*  PLT 83* 62*    BMET  Recent Labs  08/21/15 0444 08/22/15 0515  NA 143 143  K 3.6 4.2  CL 120* 116*  CO2 20* 22  GLUCOSE 155* 125*  BUN 14 13  CREATININE 1.87* 1.60*  CALCIUM 7.5* 7.0*    Creat trend  2016 3.2=>2.98=>2.46=>1.87=>1.6 2015 2.13=>0.9 2014 1.83=>1.3  MICRO Recent Results (from the past 240 hour(s))  Culture, body fluid-bottle     Status: None   Collection Time: 08/15/15  1:00 PM  Result Value Ref Range Status   Specimen Description FLUID PERITONEAL COLLECTED BY DOCTOR  Final   Special Requests BOTTLES DRAWN AEROBIC AND ANAEROBIC 10CC  Final   Culture NO GROWTH 5 DAYS  Final   Report Status 08/20/2015 FINAL  Final  Gram stain     Status: None   Collection Time: 08/15/15  1:00 PM  Result Value Ref Range Status   Specimen Description FLUID PERITONEAL COLLECTED BY DOCTOR  Final   Special Requests NONE  Final   Gram Stain  Final    WBC PRESENT,BOTH PMN AND MONONUCLEAR NO ORGANISMS SEEN Performed at [facility]    Report Status 08/15/2015 FINAL  Final  C difficile quick scan w PCR reflex     Status: None   Collection Time: 08/17/15  5:25 PM  Result Value Ref Range Status   C Diff antigen NEGATIVE NEGATIVE Final   C Diff toxin NEGATIVE NEGATIVE Final   C Diff interpretation Negative for toxigenic C. difficile  Final  MRSA PCR Screening     Status: Abnormal   Collection Time: 08/17/15  9:30 PM  Result Value Ref Range Status   MRSA by PCR POSITIVE (A) NEGATIVE Final    Comment:        The GeneXpert MRSA Assay (FDA approved for NASAL specimens only), is one component of a comprehensive MRSA colonization surveillance program. It is not intended to diagnose MRSA infection nor to guide or monitor treatment for MRSA infections. RESULT CALLED TO, READ BACK BY  AND VERIFIED WITH: HAMMOCK S AT 2332 ON [id number] BY FORSYTH K   Culture, blood (routine x 2)     Status: None (Preliminary result)   Collection Time: 08/18/15  7:10 PM  Result Value Ref Range Status   Specimen Description BLOOD RIGHT ARM  Final   Special Requests BOTTLES DRAWN AEROBIC AND ANAEROBIC 6CC  Final   Culture NO GROWTH 3 DAYS  Final   Report Status PENDING  Incomplete  Culture, blood (routine x 2)     Status: None (Preliminary result)   Collection Time: 08/18/15  8:02 PM  Result Value Ref Range Status   Specimen Description BLOOD PIC LINE  Final   Special Requests BOTTLES DRAWN AEROBIC AND ANAEROBIC 6CC  Final   Culture NO GROWTH 3 DAYS  Final   Report Status PENDING  Incomplete  Culture, Urine     Status: None (Preliminary result)   Collection Time: 08/19/15  9:08 AM  Result Value Ref Range Status   Specimen Description URINE, CATHETERIZED  Final   Special Requests Immunocompromised  Final   Culture   Final    >=100,000 COLONIES/mL ENTEROCOCCUS SPECIES Performed at [facility]    Report Status PENDING  Incomplete  Culture, body fluid-bottle     Status: None (Preliminary result)   Collection Time: 08/19/15  5:13 PM  Result Value Ref Range Status   Specimen Description PERITONEAL  Final   Special Requests NONE  Final   Culture NO GROWTH 2 DAYS  Final   Report Status PENDING  Incomplete  Gram stain     Status: None   Collection Time: 08/19/15  5:13 PM  Result Value Ref Range Status   Specimen Description PERITONEAL  Final   Special Requests NONE  Final   Gram Stain   Final    CYTOSPIN SLIDE WBC PRESENT,BOTH PMN AND MONONUCLEAR NO ORGANISMS SEEN    Report Status 08/19/2015 FINAL  Final  C difficile quick scan w PCR reflex     Status: None   Collection Time: 08/19/15  9:15 PM  Result Value Ref Range Status   C Diff antigen NEGATIVE NEGATIVE Final   C Diff toxin NEGATIVE NEGATIVE Final   C Diff interpretation Negative for toxigenic C. 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  now better   6)GI- admitted with nausea,vomiting and abdominal pain  Primary team following   7)Acid base Co2 now at goal   8) Hypocalcemia  Calcium 7.0 Albumin 2.2  Corrected calcium 7.0 + 0.8 times 1.8= 7.0+1.4=8.4  Now very near to normal  9) Thrombocytopenia  sec to Liver and ETOH issues  10) Hypophosphatemia     On replacement   Plan:  Will reduce IVf rate Will reduce PO bicarb rate.   Denai Caba S 08/22/2015, 7:50 AM

## 2015-08-22 NOTE — Progress Notes (Signed)
PT Cancellation Note  Patient Details Name: Paul [name] MRN: [id number] DOB: September 04, 1966   Cancelled Treatment:    Reason Eval/Treat Not Completed: Patient declined, no reason specified.   Attempted again to do a PT evaluation.  Pt was curled up in bed but awake.  I explained who I was and why I was there.  He agreed that he would like to try to begin moving about in the bed.  However, when I started to move the heavy covers off of his legs he became very upset and told me to leave him alone.  Per nursing staff pt will very slowly be able to turn himself in the bed independently but if staff asks him to move he becomes very agitated.  Unless his medical status and attitude change dramatically, he will not be appropriate for SNF.  I will be discontinuing my attempts at intervention.  If he improves radically, please reconsult.   [name]  PT 08/22/2015, 8:34 AM [phone]

## 2015-08-22 NOTE — Progress Notes (Signed)
[facility] PROGRESS NOTE  Paul [name] ZOX:[id number] DOB: August 26, 1966 DOA: 08/15/2015 PCP: No PCP Per Patient    Code Status: Full code Family Communication: Discussed with family on 08/20/15 and with cousin Paul [name] on 08/22/15 Disposition Plan: Discharge when clinically appropriate; likely in 2 days.   Consultants:  Gastroenterology  Nephrology  Palliative care  Procedures:  8/21-transfusion of 1 unit packed red blood cells and 2 units of platelets.  Paracentesis 8/20, per IR at [facility], yielding 6.5 L of serous fluid.  PICC 8/18  In the ED, 8/16-paracentesis yielding 5.2 L.  Antibiotics:  Elita Quick 8/20>>  Vancomycin 8/20>>  HPI/Subjective: No acute changes overnight. Patient ate most of his breakfast this morning. He has no complaints of headache, chest pain, shortness of breath, or abdominal pain.  Objective: Filed Vitals:   08/22/15 0900  BP: 111/78  Pulse: 100  Temp:   Resp: 18   temperature 98.3. Oxygen saturation 99% on room air.  Intake/Output Summary (Last 24 hours) at 08/22/15 0936 Last data filed at 08/22/15 0900  Gross per 24 hour  Intake 4378.75 ml  Output   2950 ml  Net 1428.75 ml   Filed Weights   08/20/15 0500 08/21/15 0509 08/22/15 0600  Weight: 64.1 kg (141 lb 5 oz) 63.4 kg (139 lb 12.4 oz) 64.4 kg (141 lb 15.6 oz)    Exam:   General:  Chronically debilitating appearing 49 year old African-American man in no acute distress. Overall, he looks better.  Cardiovascular: S1, S2, no murmurs rubs or gallops.  Respiratory: Breathing nonlabored. Lungs clear anteriorly, decreased breath sounds in the bases.  Abdomen: Positive bowel sounds, abdomen is significantly less distended and tense compared to several days ago; still with mild ascites. No appreciable tenderness.  Musculoskeletal/extremities: No pedal edema. No acute hot red joints.  Psychiatric/  neurologic: The patient is alert. He could not recall the name of the hospital,  month, or year this morning, but he stated that "Obama" was the president. He is oriented to himself, president, and hospital  there is no appreciable tremulousness. Cranial nerves II through XII are grossly intact.  Data Reviewed: Basic Metabolic Panel:  Recent Labs Lab 08/15/15 2310 08/16/15 0426  08/18/15 0223  08/19/15 0452 08/19/15 0904 08/20/15 0453 08/21/15 0444 08/22/15 0515  NA 134* 136  < > 139  < > 139 139 143 143 143  K <2.0* <2.0*  < > 3.0*  < > 3.7 4.1 3.9 3.6 4.2  CL 105 106  < > 116*  < > 119* 119* 122* 120* 116*  CO2 19* 19*  < > 15*  < > 15* 14* 17* 20* 22  GLUCOSE 176* 151*  < > 116*  < > 153* 190* 177* 155* 125*  BUN 21* 21*  < > 21*  < > 19 19 17 14 13   CREATININE 2.96* 3.00*  < > 3.06*  < > 2.95* 2.96* 2.46* 1.87* 1.60*  CALCIUM 6.1* 6.3*  < > 6.7*  < > 7.1* 7.2* 7.4* 7.5* 7.0*  MG 2.2 2.3  --  2.1  --   --   --   --   --   --   PHOS  --   --   --   --   --  2.1*  --  2.0*  --   --   < > = values in this interval not displayed. Liver Function Tests:  Recent Labs Lab 08/15/15 1403 08/16/15 0426 08/17/15 0348 08/19/15 1508 08/20/15 0453  AST  49* 49* 45*  --  21  ALT 26 28 32  --  21  ALKPHOS 143* 154* 143*  --  101  BILITOT 2.7* 2.4* 2.1*  --  1.8*  PROT 5.2* 5.3* 4.9*  --  4.5*  ALBUMIN 1.2* 1.3* 1.2* 1.9* 2.2*    Recent Labs Lab 08/15/15 1403  LIPASE 11*    Recent Labs Lab 08/15/15 1403 08/17/15 0939 08/18/15 0927 08/21/15 0806 08/22/15 0515  AMMONIA 122* 62* 87* 44* 38*   CBC:  Recent Labs Lab 08/15/15 1403  08/19/15 0452 08/20/15 0453 08/20/15 0803 08/20/15 1727 08/21/15 0444 08/22/15 0515  WBC 7.0  < > 5.2 5.5  --  5.8 6.4 6.1  NEUTROABS 4.5  --   --   --   --   --   --   --   HGB 8.9*  < > 7.5* 6.3*  --  7.9* 7.7* 8.5*  HCT 25.7*  < > 22.0* 18.5*  --  22.8* 22.7* 24.9*  MCV 88.0  < > 90.9 91.1  --  89.8 90.4 90.2  PLT 81*  < > 52* 30* 32* 86* 83* 62*  < > = values in this interval not displayed. Cardiac Enzymes: No  results for input(s): CKTOTAL, CKMB, CKMBINDEX, TROPONINI in the last 168 hours. BNP (last 3 results) No results for input(s): BNP in the last 8760 hours.  ProBNP (last 3 results) No results for input(s): PROBNP in the last 8760 hours.  CBG:  Recent Labs Lab 08/20/15 2223 08/21/15 0733 08/21/15 1115 08/21/15 1621 08/21/15 2229  GLUCAP 257* 128* 199* 163* 108*    Recent Results (from the past 240 hour(s))  Culture, body fluid-bottle     Status: None   Collection Time: 08/15/15  1:00 PM  Result Value Ref Range Status   Specimen Description FLUID PERITONEAL COLLECTED BY DOCTOR  Final   Special Requests BOTTLES DRAWN AEROBIC AND ANAEROBIC 10CC  Final   Culture NO GROWTH 5 DAYS  Final   Report Status 08/20/2015 FINAL  Final  Gram stain     Status: None   Collection Time: 08/15/15  1:00 PM  Result Value Ref Range Status   Specimen Description FLUID PERITONEAL COLLECTED BY DOCTOR  Final   Special Requests NONE  Final   Gram Stain   Final    WBC PRESENT,BOTH PMN AND MONONUCLEAR NO ORGANISMS SEEN Performed at [facility]    Report Status 08/15/2015 FINAL  Final  C difficile quick scan w PCR reflex     Status: None   Collection Time: 08/17/15  5:25 PM  Result Value Ref Range Status   C Diff antigen NEGATIVE NEGATIVE Final   C Diff toxin NEGATIVE NEGATIVE Final   C Diff interpretation Negative for toxigenic C. difficile  Final  MRSA PCR Screening     Status: Abnormal   Collection Time: 08/17/15  9:30 PM  Result Value Ref Range Status   MRSA by PCR POSITIVE (A) NEGATIVE Final    Comment:        The GeneXpert MRSA Assay (FDA approved for NASAL specimens only), is one component of a comprehensive MRSA colonization surveillance program. It is not intended to diagnose MRSA infection nor to guide or monitor treatment for MRSA infections. RESULT CALLED TO, READ BACK BY AND VERIFIED WITH: HAMMOCK S AT 2332 ON [id number] BY FORSYTH K   Culture, blood (routine x 2)      Status: None (Preliminary result)   Collection Time: 08/18/15  7:10 PM  Result Value Ref Range Status   Specimen Description BLOOD RIGHT ARM  Final   Special Requests BOTTLES DRAWN AEROBIC AND ANAEROBIC 6CC  Final   Culture NO GROWTH 3 DAYS  Final   Report Status PENDING  Incomplete  Culture, blood (routine x 2)     Status: None (Preliminary result)   Collection Time: 08/18/15  8:02 PM  Result Value Ref Range Status   Specimen Description BLOOD PIC LINE  Final   Special Requests BOTTLES DRAWN AEROBIC AND ANAEROBIC 6CC  Final   Culture NO GROWTH 3 DAYS  Final   Report Status PENDING  Incomplete  Culture, Urine     Status: None (Preliminary result)   Collection Time: 08/19/15  9:08 AM  Result Value Ref Range Status   Specimen Description URINE, CATHETERIZED  Final   Special Requests Immunocompromised  Final   Culture   Final    >=100,000 COLONIES/mL ENTEROCOCCUS SPECIES Performed at [facility]    Report Status PENDING  Incomplete  Culture, body fluid-bottle     Status: None (Preliminary result)   Collection Time: 08/19/15  5:13 PM  Result Value Ref Range Status   Specimen Description PERITONEAL  Final   Special Requests NONE  Final   Culture NO GROWTH 2 DAYS  Final   Report Status PENDING  Incomplete  Gram stain     Status: None   Collection Time: 08/19/15  5:13 PM  Result Value Ref Range Status   Specimen Description PERITONEAL  Final   Special Requests NONE  Final   Gram Stain   Final    CYTOSPIN SLIDE WBC PRESENT,BOTH PMN AND MONONUCLEAR NO ORGANISMS SEEN    Report Status 08/19/2015 FINAL  Final  C difficile quick scan w PCR reflex     Status: None   Collection Time: 08/19/15  9:15 PM  Result Value Ref Range Status   C Diff antigen NEGATIVE NEGATIVE Final   C Diff toxin NEGATIVE NEGATIVE Final   C Diff interpretation Negative for toxigenic C. difficile  Final     Studies: No results found.  Scheduled Meds: . sodium chloride   Intravenous Once  .  cefTAZidime (FORTAZ)  IV  2 g Intravenous Q12H  . folic acid  1 mg Oral Daily  . insulin aspart  0-9 Units Subcutaneous TID WC  . lipase/protease/amylase  36,000 Units Oral TID WC  . mupirocin ointment  1 application Nasal BID  . nicotine  14 mg Transdermal Daily  . ondansetron (ZOFRAN) IV  4 mg Intravenous 4 times per day  . pantoprazole  40 mg Oral BID AC  . phenytoin  50 mg Oral QHS  . phosphorus  500 mg Oral BID  . phytonadione (VITAMIN K) IV  10 mg Intravenous Daily  . sodium bicarbonate  650 mg Oral BID  . sodium chloride  10-40 mL Intracatheter Q12H  . sodium chloride  3 mL Intravenous Q12H  . spironolactone  50 mg Oral BID  . thiamine  100 mg Oral Daily  . vancomycin  1,000 mg Intravenous Q24H   Continuous Infusions: . dextrose 5 % and 0.45% NaCl 1,000 mL with potassium chloride 40 mEq, sodium bicarbonate 50 mEq infusion 125 mL/hr at 08/22/15 0900    Assessment and plan:  Principal Problem:   Viral gastroenteritis Active Problems:   Alcohol abuse   Hypokalemia   Thrombocytopenia   AKI (acute kidney injury)   Hepatorenal syndrome   Anemia due to other cause  Sepsis   Ascites due to alcoholic cirrhosis   Hypotension   Ascites due to alcoholic hepatitis   Encephalopathy, hepatic   Metabolic acidosis   Tobacco abuse   Seizure disorder   Type 2 diabetes with complication   Hypoalbuminemia   Palliative care encounter   DNR (do not resuscitate) discussion   Protein-calorie malnutrition, severe   GERD (gastroesophageal reflux disease)   Chronic pancreatitis   Alcoholic dementia   Brief history: The patient is a 49 year old man with a history of alcoholic liver disease, alcoholic abuse, alcoholic DTs with seizures, DM-2, and 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 Patient was found to have acute kidney injury, severe hypokalemia, metabolic acidosis,  and severe ascites. His metabolic derangements were secondary to his diarrheal illness, thought to be viral gastroenteritis and possibly from chronic pancreatitis. It was thought that he may have hepatorenal syndrome. He developed hypotension, thrombocytopenia, and worsening anemia, consistent with sepsis. His urinalysis was positive and is growing out enterococcus. His stool studies have been negative so far; GI pathogen panel pending. With the assistance of nephrology, his renal function and metabolic derangements are correcting. GI evaluated the patient and did not recommend endoscopic evaluation. It was felt that the patient had hepatic encephalopathy superimposed on probable chronic alcoholic dementia and was incapable of informed consent. Palliative care was consulted. Discussion with the family by this hospitalist yielded a DO NOT RESUSCITATE status. His family wants the patient to be discharged to skilled nursing facility for rehabilitation. The patient will likely need at least a more days of hospitalization before he is stable enough for discharge.    1. Nausea, vomiting, and reported diarrhea; abdominal pain. The patient was started on vigorous IV fluids, IV Protonix, and scheduled IV Zofran and when necessary Phenergan.  He underwent a paracentesis in the ED on 8/16 yielding 5.2 L of clear yellowish fluid. The fluid analysis was not consistent with infection. -He was started on lactulose for hepatic encephalopathy, but it was discontinued on 8/19 when he developed more frequent, multiple loose stools causing worsening metabolic acidosis and hypokalemia. He continues to have loose stools necessitating a rectal tube, but is slowing down some.  -C. difficile PCR was negative. GI pathogen panel pending. -Working diagnosis is a viral gastroenteritis, with possible contribution from chronic pancreatitis and possible colitis. -CT scan of abdomen and pelvis on 8/19 revealed -Tense ascites, severe  hepatic steatosis, chronic pancreatitis, colonic thickening, large volume GERD, thickened gastric wall. - Protonix was given IV for several days, but was changed to po twice a day. Creon was ordered on 8/22. -GI was consulted; no endoscopic evaluation was recommended. -We will advance his diet from full liquids to decrease the lactulose burden; start dysphasia 3 diet  Hypothermia, etiology not clear, but likely sepsis. Patient became hypothermic overnight 8/19.  -Studies ordered included CT scan of the abdomen and pelvis with results dictated above. His chest x-ray on 8/18 revealed bibasilar atelectasis.  -His urinalysis revealed many bacteria and 7-10 WBCs, consistent with urinary tract infection. -Blood cultures ordered and results are negative to date. -Peritoneal fluid culture on 8/16 was negative. -Peritoneal fluid culture from 8/20 ordered and is negative to date. Gram stain revealed only 8 WBCs. -Patient was started on Fortaz and vancomycin empirically on 8/19. UTI. Antibiotics were started as above. Foley catheter was inserted a few days ago for strict ins and outs. -Urine culture is growing enterococcus; sensitivities pending. -  It is undetermined if the Foley catheter was associated with UTI as the urinalysis was not ordered on admission. 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 -Mild hepatic encephalopathy. Patient has a history of multiple hospitalizations for alcoholic hepatitis. He continued to drink despite many conversations with the patient over the years by the hospitalists. Patient reported no intention of stopping. He is wholly noncompliant. -Query hepatorenal syndrome. -His ammonia level was elevated at 122 on admission. He was started on lactulose. His ammonia level has improved. He is still slightly encephalopathic, but likely has chronic alcoholic dementia. -Status post paracentesis in the ED yielding 5.2 L of yellowish fluid.  Analysis revealed a WBC of 48. Fluid culture negative to date. - Another paracentesis was done on 8/20, yielding 6.5 L of serous fluid. LDH and protein levels were consistent with a transudate. Fluid WBC was only 8. Fluid culture negative to date. -Nephrology started Lasix/albumin infusion along with IV fluids. Aldactone was added and increased to 50 mg twice a day.  -Gastroenterology was consulted. Per Dr. Darrick Penna, the patient's MELD score is 31 and has a Child Pugh C. She added that the patient's prognosis is very poor-agreed. His poor prognosis was discussed with his family on 8/21. Palliative care was consulted. -Vitamin K 10 mg IV daily 3 days ordered by Dr. Darrick Penna. Severe hypoalbuminemia secondary to cirrhosis and severe malnutrition, causing hypocalcemia. -Patient's severe hypoalbuminemia is secondary to alcoholic liver disease and malnutrition. -Corrected calcium is 8.7. -He received albumin infusion per nephrology  2 days. Severe hypokalemia. -Patient's serum potassium was 1.4 on admission. Etiology was likely secondary to  Diarrhea. - The patient received a total of 11 runs of IV potassium following admission. His magnesium level was within normal limits. -Subsequently, more IV runs were given and potassium chloride was added orally as tolerated. Potassium was added to maintenance IV fluids. Aldactone was started empirically. -Nephrology consulted and increased the oral potassium and increased the dose of Aldactone to 50 twice a day. -His serum potassium has improved progressively. Nephrology has tapered down IV fluids and oral potassium. Acute kidney injury. The patient's creatinine during the previous hospitalization was within normal limits at 0.84. On admission, it was 3.2. This is presumed to be secondary to prerenal azotemia, query hepatorenal syndrome. -Patient was started on vigorous IV fluids. Nephrology was consulted and adjusted the IV fluids and added IV Lasix. A Foley  catheter was placed for strict ins and outs. The patient is urine output was barely above nonoliguric, so nephrology ordered albumin infusion 2 days with increased IV Lasix. -Renal ultrasound was ordered and revealed mildly echogenic kidneys, consistent with medical renal disease. -Over the past 24-48 hours the patient's urine output has increased significantly; his creatinine is now below 2. Nephrology discontinued the Lasix and tapered down the IV fluids. Metabolic acidosis. -Patient CO2 was 19 on admission, but had fallen. His lactic acid level was slightly above normal range. Oral sodium bicarbonate was started in addition to adding bicarbonate to the IV fluids. Lactulose was discontinued due to the patient's frequent loose stools. -His CO2 has improved. Or bicarbonate was tapered down and discontinued in the IV fluids. Coagulopathy, Chronic normocytic anemia and thrombocytopenia, likely secondary to alcoholic liver disease/cirrhosis. - Anemia study ordered for evaluation and revealed ferritin 516, vitamin B12 1621. -Patient's INR has ranged from 2.5 to greater than 3. Stool was guaiac negative 2. -His hemoglobin had fallen to a nadir of 6.3 and platelets to a nadir of 30. -DIC panel ordered, but no  schistocytes were seen. -HIV serology was negative. -Patient was transfused 1 unit of packed red blood cells and 2 units of platelets on 08/20/15. Both his hemoglobin and platelet count have improved. -Etiology likely secondary to acute and chronic disease with associated infection. Type 2 diabetes mellitus. Patient had been noncompliant with therapy. His CBGs have been reasonable, ranging from 120-170. -Sliding scale NovoLog was started. Hemoglobin A1c was 5.5. Alcohol and cocaine abuse; tobacco abuse. -Likely alcoholic dementia with poor decision making and insight. Patient has been counseled multiple times during previous hospitalizations. He appears to have no intention of stopping. He has a  history of leaving AMA during most of not all of the previous hospitalizations. -Palliative care consultation was ordered. Assessment noted and appreciated. -Nevertheless, he was advised and encouraged to stop all 3. -He has had some mild tremor, but he does not appear to be in severe alcohol withdrawal. -We'll continue diazepam via the alcohol withdrawal protocol. -We'll continue  thiamine and folate. Seizure disorder. The patient had been prescribed Dilantin, but he had been noncompliant. -Dilantin was given IV for several days before it was tapered down and changed to by mouth. We'll continue 50 mg daily at bedtime. Seizure disorder is likely secondary to alcohol withdrawal syndrome. He may not need Dilantin indefinitely.  Mildly elevated TSH. Free T4 was within normal limits.  Very poor prognosis.  This was noted also by GI. Palliative care consultation was ordered by the admitting physician. Given the patient's chronic traumatic brain injury and alcoholism, he does not have the capacity for informed consent per assessment and evaluation. On 08/20/15, I had a discussion with the family including his mother, 2 brothers, and close cousin. (See detailed addendum on 08/20/2015). I gently recommended hospice and DNR status if patient continued to deteriorate. The family wanted to discuss CODE STATUS further among themselves and depending on the patient's clinical status over the weekend, they will eventually decide. -Today on 08/22/15, I discussed CODE STATUS again with Paul [name]. I indicated that although the patient had improved clinically, his prognosis was likely less than 6 months. Paul [name] had discussed CODE STATUS with the family and all are in agreement to make him a DO NOT RESUSCITATE status. The plan is to discharge him to skilled nursing facility for rehabilitation in the next couple days if he remains stable and consider palliative care follow-up in the outpatient setting.   Time spent: 35  minutes.     [facility]  [facility] Pager [phone]. If 7PM-7AM, please contact night-coverage at [email or web address], password [facility] 08/22/2015, 9:36 AM  LOS: 7 days

## 2015-08-23 DIAGNOSIS — F1027 Alcohol dependence with alcohol-induced persisting dementia: Secondary | ICD-10-CM

## 2015-08-23 DIAGNOSIS — D6489 Other specified anemias: Secondary | ICD-10-CM

## 2015-08-23 DIAGNOSIS — A084 Viral intestinal infection, unspecified: Principal | ICD-10-CM

## 2015-08-23 DIAGNOSIS — N179 Acute kidney failure, unspecified: Secondary | ICD-10-CM

## 2015-08-23 LAB — GI PATHOGEN PANEL BY PCR, STOOL
C DIFFICILE TOXIN A/B: NOT DETECTED
CRYPTOSPORIDIUM BY PCR: NOT DETECTED
Campylobacter by PCR: NOT DETECTED
E COLI (ETEC) LT/ST: NOT DETECTED
E COLI 0157 BY PCR: NOT DETECTED
E coli (STEC): NOT DETECTED
G LAMBLIA BY PCR: NOT DETECTED
NOROVIRUS G1/G2: NOT DETECTED
Rotavirus A by PCR: NOT DETECTED
Salmonella by PCR: NOT DETECTED
Shigella by PCR: NOT DETECTED

## 2015-08-23 LAB — AMMONIA: Ammonia: 36 umol/L — ABNORMAL HIGH (ref 9–35)

## 2015-08-23 LAB — CULTURE, BLOOD (ROUTINE X 2)
Culture: NO GROWTH
Culture: NO GROWTH

## 2015-08-23 LAB — CBC
HEMATOCRIT: 27.3 % — AB (ref 39.0–52.0)
HEMOGLOBIN: 9.3 g/dL — AB (ref 13.0–17.0)
MCH: 30.7 pg (ref 26.0–34.0)
MCHC: 34.1 g/dL (ref 30.0–36.0)
MCV: 90.1 fL (ref 78.0–100.0)
Platelets: 45 10*3/uL — ABNORMAL LOW (ref 150–400)
RBC: 3.03 MIL/uL — ABNORMAL LOW (ref 4.22–5.81)
RDW: 16.4 % — ABNORMAL HIGH (ref 11.5–15.5)
WBC: 6.4 10*3/uL (ref 4.0–10.5)

## 2015-08-23 LAB — BASIC METABOLIC PANEL
ANION GAP: 4 — AB (ref 5–15)
BUN: 11 mg/dL (ref 6–20)
CHLORIDE: 119 mmol/L — AB (ref 101–111)
CO2: 21 mmol/L — AB (ref 22–32)
Calcium: 7.4 mg/dL — ABNORMAL LOW (ref 8.9–10.3)
Creatinine, Ser: 1.39 mg/dL — ABNORMAL HIGH (ref 0.61–1.24)
GFR calc Af Amer: >60 mL/min (ref >60)
GFR calc non Af Amer: 58 mL/min — ABNORMAL LOW (ref >60)
GLUCOSE: 123 mg/dL — AB (ref 65–99)
POTASSIUM: 4.2 mmol/L (ref 3.5–5.1)
Sodium: 144 mmol/L (ref 135–145)

## 2015-08-23 LAB — GLUCOSE, CAPILLARY
GLUCOSE-CAPILLARY: 113 mg/dL — AB (ref 65–99)
GLUCOSE-CAPILLARY: 209 mg/dL — AB (ref 65–99)
GLUCOSE-CAPILLARY: 121 mg/dL — AB (ref 65–99)

## 2015-08-23 NOTE — Progress Notes (Signed)
[facility] PROGRESS NOTE  Paul [name] ZOX:[id number] DOB: November 06, 1966 DOA: 08/15/2015 PCP: No PCP Per Patient  Assessment/Plan: 1. Nausea, vomiting, and reported diarrhea; abdominal pain. Cdiff negative. Per GI now on weight based enzyme therapy. Appears to be tolerating solid diet although PO intake has been very poor. 2.  Hypothermia, etiology not clear, but likely sepsis. Currently afebrile with no leukocytosis  3. UTI. UC growing enterococcus sensitive to Vanc; Unclear if this is a cath associated infection since urine was not sent on admissin. Will discontinue [place]. Will continue Vanc for an additional 3 days.  4. 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 He continued to drink despite many conversations with the patient over the years by the hospitalists. Patient reported no intention of stopping. GI following. 5. AKI felt to be related to prerenal azotemial and possible hepatorenal syndrome, Nephrology is following. Creatinine is improving since admission, continue current treatments.  6. Severe hypoalbuminemia secondary to cirrhosis and severe malnutrition, causing hypocalcemia. Severe hypoalbuminemia is secondary to alcoholic liver disease and malnutrition. Per Nephrology received albumin infusion x2 days. 7. Coagulopathy, Chronic normocytic anemia and thrombocytopenia, likely secondary to alcoholic liver disease/cirrhosis. Likely secondary to acute and chronic disease with associated infection. Pt received daily vitamin K. 8. DM Type 2. Blood sugars are in reasonable range, continue SSI. 9. Alcohol and cocaine abuse; tobacco abuse. Counseled on importance of cessation. 10. Seizure disorder. Continue Dilantin and as needed valium  May be secondary to EtOH withdrawal. 11. Discussion. Pt has end stage liver disease ongoing substance abuse, his PO intake is poor and he is  very malnourished, he has had multiple readmissions, his long term prognosis is poor. PMT is following the pt and has discussed his current state of health and expected prognosis with his family. Pt would be a reasonable candidate for residential hospice, palliative services plans to meet with family tomorrow to discuss this further.   Code Status: Full DVT prophylaxis: SCDs Family Communication: no family present, unable to reach [place] by phone. Disposition Plan: Per family request SNF, pending improvement of symptoms.    Consultants:  GI    Nephrology  PMT  Procedures:  8/21-transfusion of 1 unit packed red blood cells and 2 units of platelets.  Paracentesis 8/20, per IR at [facility], yielding 6.5 L of serous fluid.  PICC 8/18  In the ED, 8/16-paracentesis yielding 5.2 L.  Antibiotics:  Vancomycin 8/23>>  Elita Quick 8/20>>8/24  HPI/Subjective: Not feeling well, abdominal pain and increasing SOB. He is confused.   Objective: Filed Vitals:   08/23/15 0525  BP: 111/80  Pulse: 100  Temp: 98 F (36.7 C)  Resp: 20    Intake/Output Summary (Last 24 hours) at 08/23/15 0803 Last data filed at 08/23/15 0515  Gross per 24 hour  Intake    930 ml  Output    500 ml  Net    430 ml   Filed Weights   08/20/15 0500 08/21/15 0509 08/22/15 0600  Weight: 64.1 kg (141 lb 5 oz) 63.4 kg (139 lb 12.4 oz) 64.4 kg (141 lb 15.6 oz)    Exam: General:  Appears comfortable, calm. Eyes: PERRL, normal lids, irises ENT: grossly normal hearing, lips, tongue Neck: no LAD, masses, thyromegaly Cardiovascular: Regular rate and rhythm, no murmur, rub or gallop. No lower extremity edema. Respiratory: Clear to auscultation bilaterally, no wheezes, rales or rhonchi. Normal respiratory effort. Abdomen: soft, nt distended. Positive bowel sounds.    Data Reviewed:  Basic Metabolic Panel:  Recent Labs Lab 08/18/15 0223  08/19/15 0452 08/19/15 0904 08/20/15 0453 08/21/15 0444 08/22/15 0515  08/23/15 0510  NA 139  < > 139 139 143 143 143 144  K 3.0*  < > 3.7 4.1 3.9 3.6 4.2 4.2  CL 116*  < > 119* 119* 122* 120* 116* 119*  CO2 15*  < > 15* 14* 17* 20* 22 21*  GLUCOSE 116*  < > 153* 190* 177* 155* 125* 123*  BUN 21*  < > CREATININE 3.06*  < > 2.95* 2.96* 2.46* 1.87* 1.60* 1.39*  CALCIUM 6.7*  < > 7.1* 7.2* 7.4* 7.5* 7.0* 7.4*  MG 2.1  --   --   --   --   --   --   --   PHOS  --   --  2.1*  --  2.0*  --   --   --   < > = values in this interval not displayed. Liver Function Tests:  Recent Labs Lab 08/17/15 0348 08/19/15 1508 08/20/15 0453  AST 45*  --  21  ALT 32  --  21  ALKPHOS 143*  --  101  BILITOT 2.1*  --  1.8*  PROT 4.9*  --  4.5*  ALBUMIN 1.2* 1.9* 2.2*   Recent Labs Lab 08/17/15 0939 08/18/15 0927 08/21/15 0806 08/22/15 0515 08/23/15 0510  AMMONIA 62* 87* 44* 38* 36*   CBC:  Recent Labs Lab 08/20/15 0453 08/20/15 0803 08/20/15 1727 08/21/15 0444 08/22/15 0515 08/23/15 0510  WBC 5.5  --  5.8 6.4 6.1 6.4  HGB 6.3*  --  7.9* 7.7* 8.5* 9.3*  HCT 18.5*  --  22.8* 22.7* 24.9* 27.3*  MCV 91.1  --  89.8 90.4 90.2 90.1  PLT 30* 32* 86* 83* 62* 45*   CBG:  Recent Labs Lab 08/22/15 0726 08/22/15 1106 08/22/15 1639 08/22/15 2118 08/23/15 0733  GLUCAP 129* 148* 201* 124* 113*    Recent Results (from the past 240 hour(s))  Culture, body fluid-bottle     Status: None   Collection Time: 08/15/15  1:00 PM  Result Value Ref Range Status   Specimen Description FLUID PERITONEAL COLLECTED BY DOCTOR  Final   Special Requests BOTTLES DRAWN AEROBIC AND ANAEROBIC 10CC  Final   Culture NO GROWTH 5 DAYS  Final   Report Status 08/20/2015 FINAL  Final  Gram stain     Status: None   Collection Time: 08/15/15  1:00 PM  Result Value Ref Range Status   Specimen Description FLUID PERITONEAL COLLECTED BY DOCTOR  Final   Special Requests NONE  Final   Gram Stain   Final    WBC PRESENT,BOTH PMN AND MONONUCLEAR NO ORGANISMS SEEN Performed  at [facility]    Report Status 08/15/2015 FINAL  Final  C difficile quick scan w PCR reflex     Status: None   Collection Time: 08/17/15  5:25 PM  Result Value Ref Range Status   C Diff antigen NEGATIVE NEGATIVE Final   C Diff toxin NEGATIVE NEGATIVE Final   C Diff interpretation Negative for toxigenic C. difficile  Final  MRSA PCR Screening     Status: Abnormal   Collection Time: 08/17/15  9:30 PM  Result Value Ref Range Status   MRSA by PCR POSITIVE (A) NEGATIVE Final    Comment:        The GeneXpert MRSA Assay (FDA approved for NASAL specimens only), is  one component of a comprehensive MRSA colonization surveillance program. It is not intended to diagnose MRSA infection nor to guide or monitor treatment for MRSA infections. RESULT CALLED TO, READ BACK BY AND VERIFIED WITH: HAMMOCK S AT 2332 ON [id number] BY FORSYTH K   Culture, blood (routine x 2)     Status: None (Preliminary result)   Collection Time: 08/18/15  7:10 PM  Result Value Ref Range Status   Specimen Description BLOOD RIGHT ARM  Final   Special Requests BOTTLES DRAWN AEROBIC AND ANAEROBIC 6CC  Final   Culture NO GROWTH 4 DAYS  Final   Report Status PENDING  Incomplete  Culture, blood (routine x 2)     Status: None (Preliminary result)   Collection Time: 08/18/15  8:02 PM  Result Value Ref Range Status   Specimen Description BLOOD PICC LINE DRAWN BY RN  Final   Special Requests BOTTLES DRAWN AEROBIC AND ANAEROBIC 6CC  Final   Culture NO GROWTH 4 DAYS  Final   Report Status PENDING  Incomplete  Culture, Urine     Status: None   Collection Time: 08/19/15  9:08 AM  Result Value Ref Range Status   Specimen Description URINE, CATHETERIZED  Final   Special Requests IMMUNE:COMPROMISED  Final   Culture   Final    >=100,000 COLONIES/mL ENTEROCOCCUS SPECIES Performed at [facility]    Report Status 08/22/2015 FINAL  Final   Organism ID, Bacteria ENTEROCOCCUS SPECIES  Final      Susceptibility    Enterococcus species - MIC*    AMPICILLIN <=2 SENSITIVE Sensitive     LEVOFLOXACIN 2 SENSITIVE Sensitive     NITROFURANTOIN <=16 SENSITIVE Sensitive     VANCOMYCIN 2 SENSITIVE Sensitive     LINEZOLID 2 SENSITIVE Sensitive     * >=100,000 COLONIES/mL ENTEROCOCCUS SPECIES  Culture, body fluid-bottle     Status: None (Preliminary result)   Collection Time: 08/19/15  5:13 PM  Result Value Ref Range Status   Specimen Description PERITONEAL  Final   Special Requests NONE  Final   Culture NO GROWTH 3 DAYS  Final   Report Status PENDING  Incomplete  Gram stain     Status: None   Collection Time: 08/19/15  5:13 PM  Result Value Ref Range Status   Specimen Description PERITONEAL  Final   Special Requests NONE  Final   Gram Stain   Final    CYTOSPIN SLIDE WBC PRESENT,BOTH PMN AND MONONUCLEAR NO ORGANISMS SEEN    Report Status 08/19/2015 FINAL  Final  C difficile quick scan w PCR reflex     Status: None   Collection Time: 08/19/15  9:15 PM  Result Value Ref Range Status   C Diff antigen NEGATIVE NEGATIVE Final   C Diff toxin NEGATIVE NEGATIVE Final   C Diff interpretation Negative for toxigenic C. difficile  Final     Studies: No results found.  Scheduled Meds: . cefTAZidime (FORTAZ)  IV  2 g Intravenous Q12H  . feeding supplement (ENSURE ENLIVE)  237 mL Oral BID BM  . folic acid  1 mg Oral Daily  . insulin aspart  0-9 Units Subcutaneous TID WC  . lipase/protease/amylase  36,000 Units Oral TID WC  . nicotine  14 mg Transdermal Daily  . ondansetron (ZOFRAN) IV  4 mg Intravenous 4 times per day  . pantoprazole  40 mg Oral BID AC  . phenytoin  50 mg Oral QHS  . phosphorus  500 mg Oral BID  .  sodium bicarbonate  650 mg Oral BID  . sodium chloride  10-40 mL Intracatheter Q12H  . sodium chloride  3 mL Intravenous Q12H  . spironolactone  50 mg Oral BID  . thiamine  100 mg Oral Daily  . vancomycin  750 mg Intravenous Q24H   Continuous Infusions: . dextrose 5 % and 0.45% NaCl 1,000  mL with potassium chloride 40 mEq, sodium bicarbonate 50 mEq infusion 100 mL/hr at 08/23/15 0303    Principal Problem:   Viral gastroenteritis Active Problems:   Alcohol abuse   Tobacco abuse   Seizure disorder   Hypokalemia   Thrombocytopenia   AKI (acute kidney injury)   Hypotension   Hepatorenal syndrome   Type 2 diabetes with complication   Hypoalbuminemia   Ascites due to alcoholic hepatitis   Palliative care encounter   DNR (do not resuscitate) discussion   Protein-calorie malnutrition, severe   Encephalopathy, hepatic   Metabolic acidosis   GERD (gastroesophageal reflux disease)   Anemia due to other cause   Sepsis   Chronic pancreatitis   Ascites due to alcoholic cirrhosis   Alcoholic dementia    Time spent: 20 minutes    Erick Blinks, MD  [facility] Pager [phone] If 7PM-7AM, please contact night-coverage at [email or web address], password [facility] 08/23/2015, 8:03 AM  LOS: 8 days     I, Jessica D. Jari Pigg, acting as scribe, recorded this note contemporaneously in the presence of Dr. Erick Blinks, M.D. on 08/23/2015.   I have reviewed the above documentation for accuracy and completeness, and I agree with the above.  Auryn Paige

## 2015-08-23 NOTE — Progress Notes (Signed)
Paul [name]  MRN: [id number]  DOB/AGE: June 06, 1966 49 y.o.  Primary Care Physician:No PCP Per Patient  Admit date: 08/15/2015  Chief Complaint:  Chief Complaint  Patient presents with  . Emesis  . Diarrhea  . Abdominal Pain    S-Pt presented on  08/15/2015 with  Chief Complaint  Patient presents with  . Emesis  . Diarrhea  . Abdominal Pain  .    Pt offers no new complaints.pt says " I am okay"     Meds . cefTAZidime (FORTAZ)  IV  2 g Intravenous Q12H  . feeding supplement (ENSURE ENLIVE)  237 mL Oral BID BM  . folic acid  1 mg Oral Daily  . insulin aspart  0-9 Units Subcutaneous TID WC  . lipase/protease/amylase  36,000 Units Oral TID WC  . nicotine  14 mg Transdermal Daily  . ondansetron (ZOFRAN) IV  4 mg Intravenous 4 times per day  . pantoprazole  40 mg Oral BID AC  . phenytoin  50 mg Oral QHS  . phosphorus  500 mg Oral BID  . sodium bicarbonate  650 mg Oral BID  . sodium chloride  10-40 mL Intracatheter Q12H  . sodium chloride  3 mL Intravenous Q12H  . spironolactone  50 mg Oral BID  . thiamine  100 mg Oral Daily  . vancomycin  750 mg Intravenous Q24H      Physical Exam: Vital signs in last 24 hours: Temp:  [97.9 F (36.6 C)-98 F (36.7 C)] 98 F (36.7 C) (08/24 0525) Pulse Rate:  [92-104] 100 (08/24 0525) Resp:  [17-20] 20 (08/24 0525) BP: (94-112)/(63-85) 111/80 mmHg (08/24 0525) SpO2:  [98 %-100 %] 100 % (08/24 0525) Weight change:  Last BM Date: 08/22/15  Intake/Output from previous day: 08/23 0701 - 08/24 0700 In: 1535 [P.O.:960; I.V.:325; IV Piggyback:250] Out: 500 [Urine:500]     Physical Exam: General- pt is awake,alert, follows commands. Resp- No acute REsp distress, decreased Bs at bases CVS- S1S2 regular in rate and rhythm GIT- BS+, soft, NT. EXT- NO LE Edema, NO Cyanosis   Lab Results: CBC  Recent Labs  08/22/15 0515 08/23/15 0510  WBC 6.1 6.4  HGB 8.5* 9.3*  HCT 24.9* 27.3*  PLT 62* 45*    BMET  Recent  Labs  08/22/15 0515 08/23/15 0510  NA 143 144  K 4.2 4.2  CL 116* 119*  CO2 22 21*  GLUCOSE 125* 123*  BUN 13 11  CREATININE 1.60* 1.39*  CALCIUM 7.0* 7.4*    Creat trend  2016 3.2=>2.98=>2.46=>1.87=>1.6=>1.39 2015 2.13=>0.9 2014 1.83=>1.3  MICRO Recent Results (from the past 240 hour(s))  Culture, body fluid-bottle     Status: None   Collection Time: 08/15/15  1:00 PM  Result Value Ref Range Status   Specimen Description FLUID PERITONEAL COLLECTED BY DOCTOR  Final   Special Requests BOTTLES DRAWN AEROBIC AND ANAEROBIC 10CC  Final   Culture NO GROWTH 5 DAYS  Final   Report Status 08/20/2015 FINAL  Final  Gram stain     Status: None   Collection Time: 08/15/15  1:00 PM  Result Value Ref Range Status   Specimen Description FLUID PERITONEAL COLLECTED BY DOCTOR  Final   Special Requests NONE  Final   Gram Stain   Final    WBC PRESENT,BOTH PMN AND MONONUCLEAR NO ORGANISMS SEEN Performed at [facility]    Report Status 08/15/2015 FINAL  Final  C difficile quick scan w PCR reflex     Status:  None   Collection Time: 08/17/15  5:25 PM  Result Value Ref Range Status   C Diff antigen NEGATIVE NEGATIVE Final   C Diff toxin NEGATIVE NEGATIVE Final   C Diff interpretation Negative for toxigenic C. difficile  Final  MRSA PCR Screening     Status: Abnormal   Collection Time: 08/17/15  9:30 PM  Result Value Ref Range Status   MRSA by PCR POSITIVE (A) NEGATIVE Final    Comment:        The GeneXpert MRSA Assay (FDA approved for NASAL specimens only), is one component of a comprehensive MRSA colonization surveillance program. It is not intended to diagnose MRSA infection nor to guide or monitor treatment for MRSA infections. RESULT CALLED TO, READ BACK BY AND VERIFIED WITH: HAMMOCK S AT 2332 ON [id number] BY FORSYTH K   Culture, blood (routine x 2)     Status: None (Preliminary result)   Collection Time: 08/18/15  7:10 PM  Result Value Ref Range Status   Specimen  Description BLOOD RIGHT ARM  Final   Special Requests BOTTLES DRAWN AEROBIC AND ANAEROBIC 6CC  Final   Culture NO GROWTH 4 DAYS  Final   Report Status PENDING  Incomplete  Culture, blood (routine x 2)     Status: None (Preliminary result)   Collection Time: 08/18/15  8:02 PM  Result Value Ref Range Status   Specimen Description BLOOD PICC LINE DRAWN BY RN  Final   Special Requests BOTTLES DRAWN AEROBIC AND ANAEROBIC 6CC  Final   Culture NO GROWTH 4 DAYS  Final   Report Status PENDING  Incomplete  Culture, Urine     Status: None   Collection Time: 08/19/15  9:08 AM  Result Value Ref Range Status   Specimen Description URINE, CATHETERIZED  Final   Special Requests IMMUNE:COMPROMISED  Final   Culture   Final    >=100,000 COLONIES/mL ENTEROCOCCUS SPECIES Performed at [facility]    Report Status 08/22/2015 FINAL  Final   Organism ID, Bacteria ENTEROCOCCUS SPECIES  Final      Susceptibility   Enterococcus species - MIC*    AMPICILLIN <=2 SENSITIVE Sensitive     LEVOFLOXACIN 2 SENSITIVE Sensitive     NITROFURANTOIN <=16 SENSITIVE Sensitive     VANCOMYCIN 2 SENSITIVE Sensitive     LINEZOLID 2 SENSITIVE Sensitive     * >=100,000 COLONIES/mL ENTEROCOCCUS SPECIES  Culture, body fluid-bottle     Status: None (Preliminary result)   Collection Time: 08/19/15  5:13 PM  Result Value Ref Range Status   Specimen Description PERITONEAL  Final   Special Requests NONE  Final   Culture NO GROWTH 3 DAYS  Final   Report Status PENDING  Incomplete  Gram stain     Status: None   Collection Time: 08/19/15  5:13 PM  Result Value Ref Range Status   Specimen Description PERITONEAL  Final   Special Requests NONE  Final   Gram Stain   Final    CYTOSPIN SLIDE WBC PRESENT,BOTH PMN AND MONONUCLEAR NO ORGANISMS SEEN    Report Status 08/19/2015 FINAL  Final  C difficile quick scan w PCR reflex     Status: None   Collection Time: 08/19/15  9:15 PM  Result Value Ref Range Status   C Diff antigen  NEGATIVE NEGATIVE Final   C Diff toxin NEGATIVE NEGATIVE Final   C Diff interpretation Negative for toxigenic C. difficile  Final      Lab Results  Component Value  Date   CALCIUM 7.4* 08/23/2015   CAION 1.11* 05/17/2014   PHOS 2.0* 08/20/2015          Impression: 1)Renal AKI secondary to Hypotension/hypovolemia  AKI sec to   Prerenal  ATN Earlier episodes of AKI in 2014 and 2015                 AKi now better,Creatinine is trending down                 2)CVs- BP is stable   3)Anemia HGb improving   4)Liver- hx of Cirrhosis sec to ETOH abuse  Pt continues to abuse ETOH   5)Hypokalmia- sec to ETOH and GI losses .  now better   6)GI- admitted with nausea,vomiting and abdominal pain  Primary team following   7)Acid base Co2 just at goal   8) Hypocalcemia  Calcium 7.4 Albumin 2.2  Corrected calcium 7.4 + 0.8 times 1.8= 7.0+1.4=8.8  Now normal  9) Thrombocytopenia  sec to Liver and ETOH issues  10) Hypophosphatemia     On replacement   Plan:  Will continue current tx    Payne Garske S 08/23/2015, 8:44 AM

## 2015-08-23 NOTE — Progress Notes (Signed)
Pt up to bathroom, flexiseal displaced.  Pt refusing to replace flexiseal at this time.

## 2015-08-23 NOTE — Progress Notes (Signed)
Subjective:  Eating and watching TV. Follows simple commands but does not engage in conversation. Paul [name], at bedside.   Objective: Vital signs in last 24 hours: Temp:  [97.9 F (36.6 C)-98 F (36.7 C)] 98 F (36.7 C) (08/24 0525) Pulse Rate:  [92-104] 100 (08/24 0525) Resp:  [17-23] 20 (08/24 0525) BP: (94-112)/(63-85) 111/80 mmHg (08/24 0525) SpO2:  [98 %-100 %] 100 % (08/24 0525) Last BM Date: 08/22/15 General:   Alert,  Flat affect. Eating/drinking. Does not answer questions.  Head:  Normocephalic and atraumatic. Eyes:  Sclera clear, no icterus.  Abdomen:  Soft, moderate distention, nontender. Normal bowel sounds, without guarding, and without rebound.   Extremities:  Without clubbing, deformity or edema. Neurologic:  Alert. No asterixis. Skin:  Intact without significant lesions or rashes. Psych:  Alert. Flat affect.   Intake/Output from previous day: 08/23 0701 - 08/24 0700 In: 1535 [P.O.:960; I.V.:325; IV Piggyback:250] Out: 500 [Urine:500] Intake/Output this shift:    Lab Results: CBC  Recent Labs  08/21/15 0444 08/22/15 0515 08/23/15 0510  WBC 6.4 6.1 6.4  HGB 7.7* 8.5* 9.3*  HCT 22.7* 24.9* 27.3*  MCV 90.4 90.2 90.1  PLT 83* 62* 45*   BMET  Recent Labs  08/21/15 0444 08/22/15 0515 08/23/15 0510  NA 143 143 144  K 3.6 4.2 4.2  CL 120* 116* 119*  CO2 20* 22 21*  GLUCOSE 155* 125* 123*  BUN CREATININE 1.87* 1.60* 1.39*  CALCIUM 7.5* 7.0* 7.4*   LFTs No results for input(s): BILITOT, BILIDIR, IBILI, ALKPHOS, AST, ALT, PROT, ALBUMIN in the last 72 hours. No results for input(s): LIPASE in the last 72 hours.  Lab Results  Component Value Date   ALT 21 08/20/2015   AST 21 08/20/2015   ALKPHOS 101 08/20/2015   BILITOT 1.8* 08/20/2015    PT/INR  Recent Labs  08/20/15 0803 08/21/15 0932 08/22/15 1444  LABPROT 28.7* 24.2* 24.6*  INR 2.75* 2.19* 2.25*   Ammonia 36 on 08/23/15  Imaging Studies: Ct Abdomen Pelvis Wo  Contrast  08/19/2015   CLINICAL DATA:  Hypothermia.  Ascites due to alcoholic cirrhosis.  EXAM: CT ABDOMEN AND PELVIS WITHOUT CONTRAST  TECHNIQUE: Multidetector CT imaging of the abdomen and pelvis was performed following the standard protocol without IV contrast.  COMPARISON:  02/19/2015  FINDINGS: BODY WALL: No contributory findings.  LOWER CHEST: Bibasilar atelectasis with small pleural effusions.  There is a large hiatal hernia with distended lower esophagus. When accounting for rugal folds herniating into the lower chest, no definite varices seen.  Anemia based on low-density blood pool.  ABDOMEN/PELVIS:  Liver: Severe fatty infiltration of the liver.  Biliary: High-density sludge present. No discrete calcified stone or over distention.  Pancreas: Chronic pancreatitis with diffuse coarse calcifications and a chronic 12 mm cyst in the head. No acute pancreatitis findings.  Spleen: Unremarkable.  Adrenals: Unremarkable.  Kidneys and ureters: No hydronephrosis or stone.  Bladder: Decompressed around a Foley catheter  Reproductive: No pathologic findings.  Bowel: Diffuse thick walled appearance of the colon is likely from collapse and portal hypertension. No inflammatory small bowel thickening. Negative appendix. Thick appearance of the stomach diffusely, although under distended.  Retroperitoneum: No mass or adenopathy.  Peritoneum: Large volume non loculated ascites throughout the abdomen.  Vascular: No acute abnormality.  OSSEOUS: Bilateral L5 pars defects with focally advanced degenerative disc narrowing and grade 1 anterolisthesis.  IMPRESSION: 1. Tense ascites. 2. Severe hepatic steatosis. 3. Chronic pancreatitis without active inflammation.  4. Colonic thickening likely from underdistention and portal colopathy, infectious colitis not excluded. 5. Patulous esophagus and hiatal hernia with large volume gastroesophageal reflux. 6. Thickened gastric wall not entirely explained by underdistention and potentially  reflecting gastritis.   Electronically Signed   By: Marnee Spring M.D.   On: 08/19/2015 05:00   US Renal  08/16/2015   CLINICAL DATA:  Acute renal failure  EXAM: RENAL ULTRASOUND  COMPARISON:  CT abdomen and pelvis February 19, 2015  FINDINGS: Right Kidney:  Length: 11.3 cm. Echogenicity is mildly increased. Renal cortical thickness within normal limits. No mass, perinephric fluid, or hydronephrosis visualized. No sonographically demonstrable calculus or ureterectasis.  Left Kidney:  Length: 10.2 cm. Echogenicity is mildly increased. Renal cortical thickness within normal limits. No mass, perinephric fluid, or hydronephrosis visualized. No sonographically demonstrable calculus or ureterectasis.  Bladder:  Decompressed with catheter and cannot be evaluated.  There is fairly extensive ascites.  IMPRESSION: Kidneys are mildly echogenic, a finding consistent with medical renal disease. No obstructing focus identified on either side.  Fairly extensive ascites.   Electronically Signed   By: Bretta Bang III M.D.   On: 08/16/2015 13:20   US Paracentesis  08/20/2015   INDICATION: Ascites, request for paracentesis.  EXAM: ULTRASOUND-GUIDED PARACENTESIS  COMPARISON:  None.  MEDICATIONS: None.  COMPLICATIONS: None immediate  TECHNIQUE: Informed written consent was obtained from the patient after a discussion of the risks, benefits and alternatives to treatment. A timeout was performed prior to the initiation of the procedure.  Initial ultrasound scanning demonstrates a large amount of ascites within the right lower abdominal quadrant. The right lower abdomen was prepped and draped in the usual sterile fashion. 1% lidocaine was used for local anesthesia.  Under direct ultrasound guidance, a 19 gauge, 7-cm, Yueh catheter was introduced. An ultrasound image was saved for documentation purposed. The paracentesis was performed. The catheter was removed and a dressing was applied. The patient tolerated the procedure  well without immediate post procedural complication.  FINDINGS: A total of approximately 6.5 liters of serous fluid was removed. Samples were sent to the laboratory as requested by the clinical team.  IMPRESSION: Successful ultrasound-guided paracentesis yielding 6.5 liters of peritoneal fluid.  Read By:  Pattricia Boss PA-C   Electronically Signed   By: Gilmer Mor D.O.   On: 08/20/2015 11:37   Dg Chest Port 1 View  08/17/2015   CLINICAL DATA:  Right PICC line placement.  EXAM: PORTABLE CHEST - 1 VIEW  COMPARISON:  02/17/2015  FINDINGS: The cardiac silhouette, mediastinal and hilar contours are normal. Low lung volumes with vascular crowding and streaky basilar atelectasis. The right PICC line tip is in the distal SVC near the cavoatrial junction.  IMPRESSION: Right PICC line tip in good position in the distal SVC.  Low lung volumes with vascular crowding and bibasilar atelectasis.   Electronically Signed   By: Rudie Meyer M.D.   On: 08/17/2015 11:49  [2 weeks]   Assessment: 49 year old male admitted with hypothermia, hypokalemia, hypotension, acute renal failure, and decompensated liver disease in the setting of ETOH cirrhosis and diarrhea. Poor overall prognosis. Renal function improving. INR stable.   Diarrhea: negative Cdiff. Likely multifactorial. Seems to have improved, only 2 stools documented last 24 hours s/p Flexiseal removed.  CT on file from 8/20 with colonic thickening likely from underdistension and portal colopathy, unable to exclude infectious colitis. Antibiotics per pharmacy. GI pathogen panel in process. Query pancreatic insufficiency with history of chronic pancreatitis. Now on weight-based  pancreatic enzyme therapy.   Encephalopathy: improved. Lactulose on hold due to diarrhea.   Ascites: abdomen less tense than yesterday. Monitor possible need for repeat paracentesis (prior LVAP 8/16 and 8/21).   Anemia: without overt GI bleeding. Heme negative X 2. Transfused one unit of  prbcs this admission.   Cirrhosis: decompensated, end-stage liver disease. Non-compliance with ETOH cessation. Appreciate palliative care consult. DNR status now noted.  Abnormal CT: reveals thickened gastric wall, likely secondary to gastritis. If patient were to improve clinically, could evaluate endoscopically. No prior colonoscopy, which would be for screening purposes and evaluation of colonic thickening on CT 8/20. Would need improvement in clinical status prior to invasive procedures.   Plan: 1. F/U pending GI pathogen panel. 2. Consider endoscopic evaluation of gastric thickening and colonic thickening on CT if were to significantly improve from a clinical standpoint,but overall prognosis remains poor.  3. Will continue to follow with you.   Leanna Battles. Dixon Boos [facility] [phone] 8/24/20168:54 AM     LOS: 8 days    Attending note:  Late entry. Patient seen around 1530 yesterday afternoon. Addendum could not be done because of Epic failure. Agree with above assessment and recommendations as outlined above. This patient looks much, much better than when I last saw him earlier in the week. Agree with above assessment and recommendations.
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
## 2015-08-23 NOTE — Progress Notes (Signed)
Pt took approximately half of his 0800/1000 medications (crushed in apple sauce) then refused to take anymore, will continue to monitor.

## 2015-08-24 ENCOUNTER — Inpatient Hospital Stay (HOSPITAL_COMMUNITY): Payer: Medicaid Other

## 2015-08-24 DIAGNOSIS — R197 Diarrhea, unspecified: Secondary | ICD-10-CM | POA: Insufficient documentation

## 2015-08-24 LAB — CBC
HCT: 24.7 % — ABNORMAL LOW (ref 39.0–52.0)
Hemoglobin: 8.4 g/dL — ABNORMAL LOW (ref 13.0–17.0)
MCH: 30.9 pg (ref 26.0–34.0)
MCHC: 34 g/dL (ref 30.0–36.0)
MCV: 90.8 fL (ref 78.0–100.0)
Platelets: 37 10*3/uL — ABNORMAL LOW (ref 150–400)
RBC: 2.72 MIL/uL — ABNORMAL LOW (ref 4.22–5.81)
RDW: 16.3 % — AB (ref 11.5–15.5)
WBC: 7.1 10*3/uL (ref 4.0–10.5)

## 2015-08-24 LAB — BASIC METABOLIC PANEL
Anion gap: 4 — ABNORMAL LOW (ref 5–15)
BUN: 10 mg/dL (ref 6–20)
CALCIUM: 7.2 mg/dL — AB (ref 8.9–10.3)
CO2: 21 mmol/L — ABNORMAL LOW (ref 22–32)
Chloride: 116 mmol/L — ABNORMAL HIGH (ref 101–111)
Creatinine, Ser: 1.28 mg/dL — ABNORMAL HIGH (ref 0.61–1.24)
GFR calc Af Amer: >60 mL/min (ref >60)
GLUCOSE: 124 mg/dL — AB (ref 65–99)
Potassium: 4.1 mmol/L (ref 3.5–5.1)
Sodium: 141 mmol/L (ref 135–145)

## 2015-08-24 LAB — CULTURE, BODY FLUID W GRAM STAIN -BOTTLE: Culture: NO GROWTH

## 2015-08-24 LAB — GLUCOSE, CAPILLARY
GLUCOSE-CAPILLARY: 103 mg/dL — AB (ref 65–99)
GLUCOSE-CAPILLARY: 118 mg/dL — AB (ref 65–99)

## 2015-08-24 LAB — CULTURE, BODY FLUID-BOTTLE

## 2015-08-24 MED ORDER — PANCRELIPASE (LIP-PROT-AMYL) 36000-114000 UNITS PO CPEP: 36000.0000 [IU] | ORAL_CAPSULE | Freq: Three times a day (TID) | ORAL | Status: AC

## 2015-08-24 MED ORDER — LOPERAMIDE HCL 2 MG PO CAPS: 2.0000 mg | ORAL_CAPSULE | Freq: Two times a day (BID) | ORAL | Status: AC

## 2015-08-24 MED ORDER — MORPHINE SULFATE (CONCENTRATE) 10 MG/0.5ML PO SOLN: 2.5000 mg | ORAL | Status: AC | PRN

## 2015-08-24 MED ORDER — MORPHINE SULFATE (CONCENTRATE) 10 MG/0.5ML PO SOLN: 2.5000 mg | ORAL | Status: DC | PRN

## 2015-08-24 MED ORDER — SPIRONOLACTONE 50 MG PO TABS: 50.0000 mg | ORAL_TABLET | Freq: Two times a day (BID) | ORAL | Status: AC

## 2015-08-24 MED ORDER — ALPRAZOLAM 0.25 MG PO TABS
0.2500 mg | ORAL_TABLET | Freq: Two times a day (BID) | ORAL | Status: DC
  Administered 2015-08-24 (×2): 0.25 mg via ORAL
  Filled 2015-08-24 (×3): qty 1

## 2015-08-24 MED ORDER — PHENYTOIN 50 MG PO CHEW: 50.0000 mg | CHEWABLE_TABLET | Freq: Every day | ORAL | Status: AC

## 2015-08-24 MED ORDER — PANTOPRAZOLE SODIUM 40 MG PO TBEC: 40.0000 mg | DELAYED_RELEASE_TABLET | Freq: Two times a day (BID) | ORAL | Status: AC

## 2015-08-24 MED ORDER — LOPERAMIDE HCL 2 MG PO CAPS
2.0000 mg | ORAL_CAPSULE | Freq: Two times a day (BID) | ORAL | Status: DC
  Administered 2015-08-24 (×2): 2 mg via ORAL
  Filled 2015-08-24 (×3): qty 1

## 2015-08-24 MED ORDER — ALPRAZOLAM 0.25 MG PO TABS: 0.2500 mg | ORAL_TABLET | Freq: Two times a day (BID) | ORAL | Status: AC
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
## 2015-08-24 NOTE — Progress Notes (Signed)
Subjective: Patient offers no complaint. He denies any difficulty in breathing. He denies any nausea or vomting  Objective: Vital signs in last 24 hours: Temp:  [98.3 F (36.8 C)-98.5 F (36.9 C)] 98.5 F (36.9 C) (08/25 0540) Pulse Rate:  [94-104] 104 (08/25 0540) Resp:  [20] 20 (08/25 0540) BP: (112-128)/(84-93) 128/93 mmHg (08/25 0540) SpO2:  [98 %-100 %] 100 % (08/25 0540) Weight:  [152 lb 12.8 oz (69.31 kg)] 152 lb 12.8 oz (69.31 kg) (08/25 0605)  Intake/Output from previous day: 08/24 0701 - 08/25 0700 In: 2761.7 [P.O.:480; I.V.:2281.7] Out: 800 [Urine:600; Stool:200] Intake/Output this shift:     Recent Labs  08/22/15 0515 08/23/15 0510 08/24/15 0425  HGB 8.5* 9.3* 8.4*    Recent Labs  08/23/15 0510 08/24/15 0425  WBC 6.4 7.1  RBC 3.03* 2.72*  HCT 27.3* 24.7*  PLT 45* 37*    Recent Labs  08/23/15 0510 08/24/15 0425  NA 144 141  K 4.2 4.1  CL 119* 116*  CO2 21* 21*  BUN 11 10  CREATININE 1.39* 1.28*  GLUCOSE 123* 124*  CALCIUM 7.4* 7.2*    Recent Labs  08/21/15 0932 08/22/15 1444  INR 2.19* 2.25*    Generally :Patient does not seem to be in any apparent distress Chest is clear to auscultation His heart exam regular rate and rhythm Abdomen is distended, nontender Extremities trace edema  Assessment/Plan: Problem #1 acute kidney injury: Most likely prerenal syndrome versus ATN. Renal function continue to improve and presently asymptomaitc Problem #2 hypokalemia: His potassium is normal Problem #3 anemia: His hemoglobin is low but stable .  Problem #4 polysubstance abuse: Cocaine and alcohol Problem #5 gastroenteritis: Patient still with diarrhea but no nausea or vomting Problem #6 alcoholic liver disease: Patient presently with ascites.  Problem #7 protein calorie malnutrition Problem #8 hypocalcemia: his calcium is very slowly improving Problem #9 hypophosphatemia: Phosphorus is low most likely from his poor nutrition. Patient on  phosphorus suplement Plan: 1]We'll d/c k-phos  2] we'll check his basic metabolic panel and phosphorus in the morning.Marland Kitchen   Lubna Stegeman S 08/24/2015, 7:57 AM

## 2015-08-24 NOTE — Progress Notes (Addendum)
Subjective:  Complains of cough. Denies abdominal pain.   Objective: Vital signs in last 24 hours: Temp:  [98.3 F (36.8 C)-98.5 F (36.9 C)] 98.5 F (36.9 C) (08/25 0540) Pulse Rate:  [94-104] 104 (08/25 0540) Resp:  [20] 20 (08/25 0540) BP: (112-128)/(84-93) 128/93 mmHg (08/25 0540) SpO2:  [98 %-100 %] 100 % (08/25 0540) Weight:  [152 lb 12.8 oz (69.31 kg)] 152 lb 12.8 oz (69.31 kg) (08/25 0605) Last BM Date: 08/23/15 General:   Alert,  Well-developed, well-nourished, pleasant and cooperative in NAD Head:  Normocephalic and atraumatic. Eyes:  Sclera clear, no icterus.   Abdomen:  Soft, distended and moderately tense.  Flexiseal with semiformed stool.  Extremities:  Without clubbing, deformity or edema. Neurologic:  Alert   Skin:  Intact without significant lesions or rashes. Psych:  Alert and cooperative. Irritable.   Intake/Output from previous day: 08/24 0701 - 08/25 0700 In: 2761.7 [P.O.:480; I.V.:2281.7] Out: 800 [Urine:600; Stool:200] Intake/Output this shift:    Lab Results: CBC  Recent Labs  08/22/15 0515 08/23/15 0510 08/24/15 0425  WBC 6.1 6.4 7.1  HGB 8.5* 9.3* 8.4*  HCT 24.9* 27.3* 24.7*  MCV 90.2 90.1 90.8  PLT 62* 45* 37*   BMET  Recent Labs  08/22/15 0515 08/23/15 0510 08/24/15 0425  NA 143 144 141  K 4.2 4.2 4.1  CL 116* 119* 116*  CO2 22 21* 21*  GLUCOSE 125* 123* 124*  BUN 13 11 10   CREATININE 1.60* 1.39* 1.28*  CALCIUM 7.0* 7.4* 7.2*   LFTs No results for input(s): BILITOT, BILIDIR, IBILI, ALKPHOS, AST, ALT, PROT, ALBUMIN in the last 72 hours. No results for input(s): LIPASE in the last 72 hours. PT/INR  Recent Labs  08/21/15 0932 08/22/15 1444  LABPROT 24.2* 24.6*  INR 2.19* 2.25*      Imaging Studies: Ct Abdomen Pelvis Wo Contrast  08/19/2015   CLINICAL DATA:  Hypothermia.  Ascites due to alcoholic cirrhosis.  EXAM: CT ABDOMEN AND PELVIS WITHOUT CONTRAST  TECHNIQUE: Multidetector CT imaging of the abdomen and  pelvis was performed following the standard protocol without IV contrast.  COMPARISON:  02/19/2015  FINDINGS: BODY WALL: No contributory findings.  LOWER CHEST: Bibasilar atelectasis with small pleural effusions.  There is a large hiatal hernia with distended lower esophagus. When accounting for rugal folds herniating into the lower chest, no definite varices seen.  Anemia based on low-density blood pool.  ABDOMEN/PELVIS:  Liver: Severe fatty infiltration of the liver.  Biliary: High-density sludge present. No discrete calcified stone or over distention.  Pancreas: Chronic pancreatitis with diffuse coarse calcifications and a chronic 12 mm cyst in the head. No acute pancreatitis findings.  Spleen: Unremarkable.  Adrenals: Unremarkable.  Kidneys and ureters: No hydronephrosis or stone.  Bladder: Decompressed around a Foley catheter  Reproductive: No pathologic findings.  Bowel: Diffuse thick walled appearance of the colon is likely from collapse and portal hypertension. No inflammatory small bowel thickening. Negative appendix. Thick appearance of the stomach diffusely, although under distended.  Retroperitoneum: No mass or adenopathy.  Peritoneum: Large volume non loculated ascites throughout the abdomen.  Vascular: No acute abnormality.  OSSEOUS: Bilateral L5 pars defects with focally advanced degenerative disc narrowing and grade 1 anterolisthesis.  IMPRESSION: 1. Tense ascites. 2. Severe hepatic steatosis. 3. Chronic pancreatitis without active inflammation. 4. Colonic thickening likely from underdistention and portal colopathy, infectious colitis not excluded. 5. Patulous esophagus and hiatal hernia with large volume gastroesophageal reflux. 6. Thickened gastric wall not entirely  explained by underdistention and potentially reflecting gastritis.   Electronically Signed   By: Marnee Spring M.D.   On: 08/19/2015 05:00   US Renal  08/16/2015   CLINICAL DATA:  Acute renal failure  EXAM: RENAL ULTRASOUND   COMPARISON:  CT abdomen and pelvis February 19, 2015  FINDINGS: Right Kidney:  Length: 11.3 cm. Echogenicity is mildly increased. Renal cortical thickness within normal limits. No mass, perinephric fluid, or hydronephrosis visualized. No sonographically demonstrable calculus or ureterectasis.  Left Kidney:  Length: 10.2 cm. Echogenicity is mildly increased. Renal cortical thickness within normal limits. No mass, perinephric fluid, or hydronephrosis visualized. No sonographically demonstrable calculus or ureterectasis.  Bladder:  Decompressed with catheter and cannot be evaluated.  There is fairly extensive ascites.  IMPRESSION: Kidneys are mildly echogenic, a finding consistent with medical renal disease. No obstructing focus identified on either side.  Fairly extensive ascites.   Electronically Signed   By: Bretta Bang III M.D.   On: 08/16/2015 13:20   US Paracentesis  08/20/2015   INDICATION: Ascites, request for paracentesis.  EXAM: ULTRASOUND-GUIDED PARACENTESIS  COMPARISON:  None.  MEDICATIONS: None.  COMPLICATIONS: None immediate  TECHNIQUE: Informed written consent was obtained from the patient after a discussion of the risks, benefits and alternatives to treatment. A timeout was performed prior to the initiation of the procedure.  Initial ultrasound scanning demonstrates a large amount of ascites within the right lower abdominal quadrant. The right lower abdomen was prepped and draped in the usual sterile fashion. 1% lidocaine was used for local anesthesia.  Under direct ultrasound guidance, a 19 gauge, 7-cm, Yueh catheter was introduced. An ultrasound image was saved for documentation purposed. The paracentesis was performed. The catheter was removed and a dressing was applied. The patient tolerated the procedure well without immediate post procedural complication.  FINDINGS: A total of approximately 6.5 liters of serous fluid was removed. Samples were sent to the laboratory as requested by the  clinical team.  IMPRESSION: Successful ultrasound-guided paracentesis yielding 6.5 liters of peritoneal fluid.  Read By:  Pattricia Boss PA-C   Electronically Signed   By: Gilmer Mor D.O.   On: 08/20/2015 11:37   Dg Chest Port 1 View  08/17/2015   CLINICAL DATA:  Right PICC line placement.  EXAM: PORTABLE CHEST - 1 VIEW  COMPARISON:  02/17/2015  FINDINGS: The cardiac silhouette, mediastinal and hilar contours are normal. Low lung volumes with vascular crowding and streaky basilar atelectasis. The right PICC line tip is in the distal SVC near the cavoatrial junction.  IMPRESSION: Right PICC line tip in good position in the distal SVC.  Low lung volumes with vascular crowding and bibasilar atelectasis.   Electronically Signed   By: Rudie Meyer M.D.   On: 08/17/2015 11:49  [2 weeks]   Assessment: 49 year old male admitted with hypothermia, hypokalemia, hypotension, acute renal failure, and decompensated liver disease in the setting of ETOH cirrhosis and diarrhea. Poor overall prognosis. Renal function improving. INR stable.   Diarrhea: negative Cdiff. Negative GI pathogen panel. Likely multifactorial.  Flexiseal back in place with semiformed stool (200 cc) and 2 stools in last 24 hours. . CT on file from 8/20 with colonic thickening likely from underdistension and portal colopathy, unable to exclude infectious colitis. Antibiotics per pharmacy. Query pancreatic insufficiency with history of chronic pancreatitis. Now on weight-based pancreatic enzyme therapy.   Encephalopathy: improved. Lactulose on hold due to diarrhea.   Ascites: abdomen less tense than yesterday. Monitor possible need for repeat paracentesis (prior  LVAP 8/16 and 8/21).   Anemia: without overt GI bleeding. Heme negative X 2. Transfused one unit of prbcs this admission.   Cirrhosis: decompensated, end-stage liver disease. Non-compliance with ETOH cessation. Appreciate palliative care consult. DNR status now noted.  Abnormal CT:  reveals thickened gastric wall, likely secondary to gastritis. If patient were to improve clinically, could evaluate endoscopically. No prior colonoscopy, which would be for screening purposes and evaluation of colonic thickening on CT 8/20. Would need improvement in clinical status prior to invasive procedures.   Plan: 1. Management of cough per attending.  2. May consider LVAP tomorrow if patient desires.  3. Continue pancreatic enzymes.  4. Consider endoscopic evaluation of gastric thickening and colonic thickening on CT if were to significantly improve from a clinical standpoint,but overall prognosis remains poor.  5. Will continue to follow with you.  Leanna Battles. Dixon Boos [facility] [phone] 8/25/20169:12 AM      LOS: 9 days    Addendum: Discussed with Dr. Jena Gauss. Patient is scheduled for LVAP tomorrow predominantly for therapeutic purposes. Will add imodium  BID. Watch for constipation, hold if needed.   Leanna Battles. Dixon Boos [facility] [phone] 8/25/20169:55 AM

## 2015-08-24 NOTE — Progress Notes (Signed)
[facility] PROGRESS NOTE  Paul [name] UJW:[id number] DOB: 1966-05-24 DOA: 08/15/2015 PCP: No PCP Per Patient  Assessment/Plan: 1. Nausea, vomiting, and reported diarrhea; abdominal pain. C. Diff negative. Per GI now on weight based enzyme therapy. Appears to be tolerating solid diet although PO intake has been very poor. 2.  Hypothermia, etiology not clear, but likely sepsis. Currently afebrile with no leukocytosis. 3. Hypophosphatemia. Phosphorus is likely low due to poor nutrition. Patient currently on phosphorus supplement. 4. UTI. UC growing enterococcus sensitive to Vanc; Unclear if this is a cath associated infection since urine was not sent on admissin. He has been treated with Vancomycin and abx have been discontinued. 5. 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 He continued to drink despite many conversations with the patient over the years by the hospitalists. Patient reported no intention of stopping. GI following.Patient was scheduled for paracentesis today but due to elevated INR, 2.25, and low platelets 37,000, pt was unable to have procedure done. Will continue to treat ascites with pain medication. 6. AKI. Felt to be related to prerenal azotemial and possible hepatorenal syndrome, Nephrology is following. Creatinine is improving since admission.  7. Severe hypoalbuminemia secondary to cirrhosis and severe malnutrition, causing hypocalcemia. Severe hypoalbuminemia is secondary to alcoholic liver disease and protein calorie malnutrition. Per Nephrology received albumin infusion x2 days. 8. Coagulopathy, Chronic normocytic anemia and thrombocytopenia, likely secondary to alcoholic liver disease/cirrhosis. Likely secondary to acute and chronic disease with associated infection. Pt received daily vitamin K. 9. DM Type 2. Blood sugars are in reasonable range, continue  SSI. 10. Alcohol and cocaine abuse; tobacco abuse. Counseled on importance of cessation. 11. Seizure disorder. Continue Dilantin and as needed benzodiazepines  May be secondary to EtOH withdrawal. 12. Discussion. Pt has end stage liver disease ongoing substance abuse, his PO intake is poor and he is very malnourished, he has had multiple readmissions, his long term prognosis is poor. PMT is following the pt and has discussed his current state of health and expected prognosis with his family. They have elected to pursue residential hospice once bed is available. Will transition his care toward comfort measures.   Code Status: DNR DVT prophylaxis: SCDs Family Communication: no family present, will try to reach Paul [name] by phone. Disposition Plan: Residential hospice bed availability.   Consultants:  GI    Nephrology  PMT  Procedures:  8/21-transfusion of 1 unit packed red blood cells and 2 units of platelets.  Paracentesis 8/20, per IR at [facility], yielding 6.5 L of serous fluid.  PICC 8/18  In the ED, 8/16-paracentesis yielding 5.2 L.  Antibiotics:  Vancomycin 8/23>>  Elita Quick 8/20>>8/24  HPI/Subjective: Patient remains with abdominal pain and pain with breathing.   Objective: Filed Vitals:   08/24/15 0540  BP: 128/93  Pulse: 104  Temp: 98.5 F (36.9 C)  Resp: 20    Intake/Output Summary (Last 24 hours) at 08/24/15 0814 Last data filed at 08/24/15 0550  Gross per 24 hour  Intake 2761.67 ml  Output    800 ml  Net 1961.67 ml   Filed Weights   08/21/15 0509 08/22/15 0600 08/24/15 0605  Weight: 63.4 kg (139 lb 12.4 oz) 64.4 kg (141 lb 15.6 oz) 69.31 kg (152 lb 12.8 oz)    Exam: General:  Appears comfortable, calm. Eyes: PERRL, normal lids, irises ENT: grossly normal hearing, lips, tongue Neck: no LAD, masses, thyromegaly Cardiovascular: RRR, no m/r/g. 1+ edema bilaterally Respiratory: Diminished breath sounds  at bases, no wheezes, rales or rhonchi. Normal  respiratory effort. Abdomen: soft, no TTP, distended. Bowel sounds +. Neurological: A&OX3. Normal speech.     Data Reviewed: Basic Metabolic Panel:  Recent Labs Lab 08/18/15 0223  08/19/15 0452  08/20/15 0453 08/21/15 0444 08/22/15 0515 08/23/15 0510 08/24/15 0425  NA 139  < > 139  < > 143 143 143 144 141  K 3.0*  < > 3.7  < > 3.9 3.6 4.2 4.2 4.1  CL 116*  < > 119*  < > 122* 120* 116* 119* 116*  CO2 15*  < > 15*  < > 17* 20* 22 21* 21*  GLUCOSE 116*  < > 153*  < > 177* 155* 125* 123* 124*  BUN 21*  < > 19  < > 17 14 13 11 10   CREATININE 3.06*  < > 2.95*  < > 2.46* 1.87* 1.60* 1.39* 1.28*  CALCIUM 6.7*  < > 7.1*  < > 7.4* 7.5* 7.0* 7.4* 7.2*  MG 2.1  --   --   --   --   --   --   --   --   PHOS  --   --  2.1*  --  2.0*  --   --   --   --   < > = values in this interval not displayed. Liver Function Tests:  Recent Labs Lab 08/19/15 1508 08/20/15 0453  AST  --  21  ALT  --  21  ALKPHOS  --  101  BILITOT  --  1.8*  PROT  --  4.5*  ALBUMIN 1.9* 2.2*    Recent Labs Lab 08/17/15 0939 08/18/15 0927 08/21/15 0806 08/22/15 0515 08/23/15 0510  AMMONIA 62* 87* 44* 38* 36*   CBC:  Recent Labs Lab 08/20/15 1727 08/21/15 0444 08/22/15 0515 08/23/15 0510 08/24/15 0425  WBC 5.8 6.4 6.1 6.4 7.1  HGB 7.9* 7.7* 8.5* 9.3* 8.4*  HCT 22.8* 22.7* 24.9* 27.3* 24.7*  MCV 89.8 90.4 90.2 90.1 90.8  PLT 86* 83* 62* 45* 37*   CBG:  Recent Labs Lab 08/22/15 2118 08/23/15 0733 08/23/15 1109 08/23/15 2153 08/24/15 0741  GLUCAP 124* 113* 209* 121* 103*    Recent Results (from the past 240 hour(s))  Culture, body fluid-bottle     Status: None   Collection Time: 08/15/15  1:00 PM  Result Value Ref Range Status   Specimen Description FLUID PERITONEAL COLLECTED BY DOCTOR  Final   Special Requests BOTTLES DRAWN AEROBIC AND ANAEROBIC 10CC  Final   Culture NO GROWTH 5 DAYS  Final   Report Status 08/20/2015 FINAL  Final  Gram stain     Status: None   Collection Time:  08/15/15  1:00 PM  Result Value Ref Range Status   Specimen Description FLUID PERITONEAL COLLECTED BY DOCTOR  Final   Special Requests NONE  Final   Gram Stain   Final    WBC PRESENT,BOTH PMN AND MONONUCLEAR NO ORGANISMS SEEN Performed at [facility]    Report Status 08/15/2015 FINAL  Final  C difficile quick scan w PCR reflex     Status: None   Collection Time: 08/17/15  5:25 PM  Result Value Ref Range Status   C Diff antigen NEGATIVE NEGATIVE Final   C Diff toxin NEGATIVE NEGATIVE Final   C Diff interpretation Negative for toxigenic C. difficile  Final  MRSA PCR Screening     Status: Abnormal   Collection Time: 08/17/15  9:30 PM  Result Value Ref Range Status   MRSA by PCR POSITIVE (A) NEGATIVE Final    Comment:        The GeneXpert MRSA Assay (FDA approved for NASAL specimens only), is one component of a comprehensive MRSA colonization surveillance program. It is not intended to diagnose MRSA infection nor to guide or monitor treatment for MRSA infections. RESULT CALLED TO, READ BACK BY AND VERIFIED WITH: HAMMOCK S AT 2332 ON [id number] BY FORSYTH K   Culture, blood (routine x 2)     Status: None   Collection Time: 08/18/15  7:10 PM  Result Value Ref Range Status   Specimen Description BLOOD RIGHT ARM  Final   Special Requests BOTTLES DRAWN AEROBIC AND ANAEROBIC 6CC  Final   Culture NO GROWTH 5 DAYS  Final   Report Status 08/23/2015 FINAL  Final  Culture, blood (routine x 2)     Status: None   Collection Time: 08/18/15  8:02 PM  Result Value Ref Range Status   Specimen Description BLOOD PICC LINE DRAWN BY RN  Final   Special Requests BOTTLES DRAWN AEROBIC AND ANAEROBIC 6CC  Final   Culture NO GROWTH 5 DAYS  Final   Report Status 08/23/2015 FINAL  Final  Culture, Urine     Status: None   Collection Time: 08/19/15  9:08 AM  Result Value Ref Range Status   Specimen Description URINE, CATHETERIZED  Final   Special Requests IMMUNE:COMPROMISED  Final   Culture    Final    >=100,000 COLONIES/mL ENTEROCOCCUS SPECIES Performed at [facility]    Report Status 08/22/2015 FINAL  Final   Organism ID, Bacteria ENTEROCOCCUS SPECIES  Final      Susceptibility   Enterococcus species - MIC*    AMPICILLIN <=2 SENSITIVE Sensitive     LEVOFLOXACIN 2 SENSITIVE Sensitive     NITROFURANTOIN <=16 SENSITIVE Sensitive     VANCOMYCIN 2 SENSITIVE Sensitive     LINEZOLID 2 SENSITIVE Sensitive     * >=100,000 COLONIES/mL ENTEROCOCCUS SPECIES  Culture, body fluid-bottle     Status: None (Preliminary result)   Collection Time: 08/19/15  5:13 PM  Result Value Ref Range Status   Specimen Description PERITONEAL  Final   Special Requests NONE  Final   Culture NO GROWTH 4 DAYS  Final   Report Status PENDING  Incomplete  Gram stain     Status: None   Collection Time: 08/19/15  5:13 PM  Result Value Ref Range Status   Specimen Description PERITONEAL  Final   Special Requests NONE  Final   Gram Stain   Final    CYTOSPIN SLIDE WBC PRESENT,BOTH PMN AND MONONUCLEAR NO ORGANISMS SEEN    Report Status 08/19/2015 FINAL  Final  C difficile quick scan w PCR reflex     Status: None   Collection Time: 08/19/15  9:15 PM  Result Value Ref Range Status   C Diff antigen NEGATIVE NEGATIVE Final   C Diff toxin NEGATIVE NEGATIVE Final   C Diff interpretation Negative for toxigenic C. difficile  Final     Studies: No results found.  Scheduled Meds: . feeding supplement (ENSURE ENLIVE)  237 mL Oral BID BM  . folic acid  1 mg Oral Daily  . insulin aspart  0-9 Units Subcutaneous TID WC  . lipase/protease/amylase  36,000 Units Oral TID WC  . nicotine  14 mg Transdermal Daily  . ondansetron (ZOFRAN) IV  4 mg Intravenous 4 times per day  . pantoprazole  40 mg Oral BID AC  . phenytoin  50 mg Oral QHS  . sodium bicarbonate  650 mg Oral BID  . sodium chloride  10-40 mL Intracatheter Q12H  . sodium chloride  3 mL Intravenous Q12H  . spironolactone  50 mg Oral BID  .  thiamine  100 mg Oral Daily  . vancomycin  750 mg Intravenous Q24H   Continuous Infusions: . dextrose 5 % and 0.45% NaCl 1,000 mL with potassium chloride 40 mEq, sodium bicarbonate 50 mEq infusion 100 mL/hr at 08/24/15 0547    Principal Problem:   Viral gastroenteritis Active Problems:   Alcohol abuse   Tobacco abuse   Seizure disorder   Hypokalemia   Thrombocytopenia   AKI (acute kidney injury)   Hypotension   Hepatorenal syndrome   Type 2 diabetes with complication   Hypoalbuminemia   Ascites due to alcoholic hepatitis   Palliative care encounter   DNR (do not resuscitate) discussion   Protein-calorie malnutrition, severe   Encephalopathy, hepatic   Metabolic acidosis   GERD (gastroesophageal reflux disease)   Anemia due to other cause   Sepsis   Chronic pancreatitis   Ascites due to alcoholic cirrhosis   Alcoholic dementia    Time spent: 25 minutes    Erick Blinks, MD  [facility] Pager [phone] If 7PM-7AM, please contact night-coverage at [email or web address], password [facility] 08/24/2015, 8:14 AM  LOS: 9 days     I, Norg'E Tisdol, acting as scribe, recorded this note contemporaneously in the presence of Dr. Erick Blinks, M.D. on 08/24/2015.   I have reviewed the above documentation for accuracy and completeness, and I agree with the above.  Erick Blinks, MD

## 2015-08-24 NOTE — Care Management Note (Signed)
Case Management Note  Patient Details  Name: TUVIA WOODRICK MRN: [id number] Date of Birth: October 04, 1966  Subjective/Objective:                    Action/Plan:   Expected Discharge Date:                  Expected Discharge Plan:  Home/Self Care  In-House Referral:  Clinical Social Work, Museum/gallery exhibitions officer  CM Consult  Post Acute Care Choice:  NA Choice offered to:  NA  DME Arranged:    DME Agency:     HH Arranged:    HH Agency:     Status of Service:  In process, will continue to follow  Medicare Important Message Given:    Date Medicare IM Given:    Medicare IM give by:    Date Additional Medicare IM Given:    Additional Medicare Important Message give by:     If discussed at Long Length of Stay Meetings, dates discussed:  08/24/15  Additional Comments:  Cheryl Flash, RN 08/24/2015, 3:21 PM

## 2015-08-24 NOTE — Progress Notes (Signed)
Patient complains of nausea and abdomen pain. Refuses Zofran and pain medications after education given.

## 2015-08-24 NOTE — Discharge Summary (Signed)
Physician Discharge Summary  Paul [name]:[id number] DOB: [birth year] DOA: 08/15/2015  PCP: No PCP Per Patient  Admit date: 08/15/2015 Discharge date: 08/25/2015  Time spent: 40 minutes  Recommendations for Outpatient Follow-up:  1. Patient will discharge to residential hospice for end of life care  Discharge Diagnoses:  Principal Problem:   Viral gastroenteritis Active Problems:   Alcohol abuse   Tobacco abuse   Seizure disorder   Hypokalemia   Thrombocytopenia   AKI (acute kidney injury)   Hypotension   Hepatorenal syndrome   Type 2 diabetes with complication   Hypoalbuminemia   Ascites due to alcoholic hepatitis   Palliative care encounter   DNR (do not resuscitate) discussion   Protein-calorie malnutrition, severe   Encephalopathy, hepatic   Metabolic acidosis   GERD (gastroesophageal reflux disease)   Anemia due to other cause   Sepsis   Chronic pancreatitis   Ascites due to alcoholic cirrhosis   Alcoholic dementia   Diarrhea   Discharge Condition: stable  Diet recommendation: regular diet for comfort  Filed Weights   08/21/15 0509 08/22/15 0600 08/24/15 0605  Weight: 63.4 kg (139 lb 12.4 oz) 64.4 kg (141 lb 15.6 oz) 69.31 kg (152 lb 12.8 oz)    History of present illness:  This patient presented to the hospital with emesis and diarrhea. He reported feeling increasingly weak. He was found to be dehydrated, severely hypokalemic, hypotensive. He was admitted to the hospital for further treatments   Hospital Course:  1. Nausea, vomiting, diarrhea, felt to be related to gastroenteritis, or possibly chronic pancreatitis. He was treated supportively with antiemetics and antidiarrheals and started on pancreatic enzyme supplements. He was followed by GI. Diarrhea appears to have improved and he is tolerating a solid diet. His po intake has been very poor.  2. UTI with sepsis. Initially found to have enterococcus in urine and was started on broad spectrum  antibiotics. Further review of cultures indicated that this may have been a colonized urine culture. In any case, he was adequately treated with antibiotics.  3. 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 Patient has a long history of substance abuse. He continued to drink alcohol up until his admission. He required 2 high volume paracentesis during his hospital stay. There were plans for a third paracentesis, but this could not be safely performed with his coagulopathy. His long term prognosis is poor. Will continue on spironolactone. Treat symptomatically with pain management   4. AKI felt to be related to prerenal azotemial and possible hepatorenal syndrome. Followed by nephrology during hospitalization. Creatinine has improved with IV fluids.  5. Coagulopathy. Patient has elevated INR 2.2 and thrombocytopenia 37,000 related to his advanced liver disease. He received vit K without significant benefit.   6. Failure to thrive. Patient has severe hypoalbuminemia likely related to cirrhosis and his po intake has been poor. Palliative care evaluated the patient and discussed case with his family. It was felt that patient did not have capacity to make decisions due to his underlying alcoholic dementia and acute encephalopathy. It was decided that with his poor prognosis and recurrent admissions to the hospital, that pursuing a hospice approach would be best for the patient. Family has elected to pursue residential hospice. Plans will be to discharge to hospice once bed is available.  Procedures: 1. 8/21-transfusion of 1 unit packed red blood cells and 2 units of platelets. 2. Paracentesis 8/20, per IR at [facility], yielding 6.5 L of serous fluid. 3. PICC 8/18 4. In the  ED, 8/16-paracentesis yielding 5.2 L.  Consultations:  Gastroenterology  Nephrology  Palliative care  Discharge Exam: Filed Vitals:   08/24/15 1506  BP: 108/72  Pulse: 103  Temp: 97.9 F (36.6 C)  Resp:  20    General: NAD Cardiovascular: S1, S2 RRR Respiratory: diminished breath sounds at bases Abd: distended, soft, bs+  Discharge Instructions    Current Discharge Medication List    START taking these medications   Details  ALPRAZolam (XANAX) 0.25 MG tablet Take 1 tablet (0.25 mg total) by mouth 2 (two) times daily. Qty: 30 tablet, Refills: 0    lipase/protease/amylase (CREON) 36000 UNITS CPEP capsule Take 1 capsule (36,000 Units total) by mouth 3 (three) times daily with meals.    loperamide (IMODIUM) 2 MG capsule Take 1 capsule (2 mg total) by mouth 2 (two) times daily. Qty: 30 capsule, Refills: 0    Morphine Sulfate (MORPHINE CONCENTRATE) 10 MG/0.5ML SOLN concentrated solution Take 0.13 mLs (2.6 mg total) by mouth every 4 (four) hours as needed for moderate pain or severe pain (please give for RR 20 or greater, s/s of dyspnea, air hunger.). Qty: 42 mL, Refills: 0    pantoprazole (PROTONIX) 40 MG tablet Take 1 tablet (40 mg total) by mouth 2 (two) times daily before a meal.    phenytoin (DILANTIN) 50 MG tablet Chew 1 tablet (50 mg total) by mouth at bedtime.    spironolactone (ALDACTONE) 50 MG tablet Take 1 tablet (50 mg total) by mouth 2 (two) times daily.      STOP taking these medications     phenytoin (DILANTIN) 100 MG ER capsule      potassium chloride (K-DUR) 10 MEQ tablet        No Known Allergies Follow-up Information    Follow up with [facility] (For substance abuse treatment/assessment). Go on 08/28/2015.   Why:  For substance abuse assessment/treatment options. [name] needs to come anytime between the hours of 8:00 a.m. and 3:00 p.m. on 08/28/15.   Contact information:   (in the old mental health building) [address], [place], [place] [address] [phone]       The results of significant diagnostics from this hospitalization (including imaging, microbiology, ancillary and laboratory) are listed below for reference.    Significant  Diagnostic Studies: Ct Abdomen Pelvis Wo Contrast  08/19/2015   CLINICAL DATA:  Hypothermia.  Ascites due to alcoholic cirrhosis.  EXAM: CT ABDOMEN AND PELVIS WITHOUT CONTRAST  TECHNIQUE: Multidetector CT imaging of the abdomen and pelvis was performed following the standard protocol without IV contrast.  COMPARISON:  02/19/2015  FINDINGS: BODY WALL: No contributory findings.  LOWER CHEST: Bibasilar atelectasis with small pleural effusions.  There is a large hiatal hernia with distended lower esophagus. When accounting for rugal folds herniating into the lower chest, no definite varices seen.  Anemia based on low-density blood pool.  ABDOMEN/PELVIS:  Liver: Severe fatty infiltration of the liver.  Biliary: High-density sludge present. No discrete calcified stone or over distention.  Pancreas: Chronic pancreatitis with diffuse coarse calcifications and a chronic 12 mm cyst in the head. No acute pancreatitis findings.  Spleen: Unremarkable.  Adrenals: Unremarkable.  Kidneys and ureters: No hydronephrosis or stone.  Bladder: Decompressed around a Foley catheter  Reproductive: No pathologic findings.  Bowel: Diffuse thick walled appearance of the colon is likely from collapse and portal hypertension. No inflammatory small bowel thickening. Negative appendix. Thick appearance of the stomach diffusely, although under distended.  Retroperitoneum: No mass or adenopathy.  Peritoneum:  Large volume non loculated ascites throughout the abdomen.  Vascular: No acute abnormality.  OSSEOUS: Bilateral L5 pars defects with focally advanced degenerative disc narrowing and grade 1 anterolisthesis.  IMPRESSION: 1. Tense ascites. 2. Severe hepatic steatosis. 3. Chronic pancreatitis without active inflammation. 4. Colonic thickening likely from underdistention and portal colopathy, infectious colitis not excluded. 5. Patulous esophagus and hiatal hernia with large volume gastroesophageal reflux. 6. Thickened gastric wall not entirely  explained by underdistention and potentially reflecting gastritis.   Electronically Signed   By: Marnee Spring M.D.   On: 08/19/2015 05:00   US Renal  08/16/2015   CLINICAL DATA:  Acute renal failure  EXAM: RENAL ULTRASOUND  COMPARISON:  CT abdomen and pelvis February 19, 2015  FINDINGS: Right Kidney:  Length: 11.3 cm. Echogenicity is mildly increased. Renal cortical thickness within normal limits. No mass, perinephric fluid, or hydronephrosis visualized. No sonographically demonstrable calculus or ureterectasis.  Left Kidney:  Length: 10.2 cm. Echogenicity is mildly increased. Renal cortical thickness within normal limits. No mass, perinephric fluid, or hydronephrosis visualized. No sonographically demonstrable calculus or ureterectasis.  Bladder:  Decompressed with catheter and cannot be evaluated.  There is fairly extensive ascites.  IMPRESSION: Kidneys are mildly echogenic, a finding consistent with medical renal disease. No obstructing focus identified on either side.  Fairly extensive ascites.   Electronically Signed   By: Bretta Bang III M.D.   On: 08/16/2015 13:20   US Abdomen Limited  08/24/2015   CLINICAL DATA:  Plan for paracentesis.  Liver disease.  EXAM: LIMITED ABDOMINAL ULTRASOUND  COMPARISON:  04/19/2015  FINDINGS: Patient was brought down in preparation for paracentesis, thickening of the abdomen showed a large volume of simple appearing ascites in the bilateral abdomen, but less than seen on preoperative scanning 08/19/2015. Patient's labs were reviewed and in light of progressive thrombocytopenia with elevated INR case was discussed with referring service and plan was made to defer paracentesis until more pressing symptoms/indication.  IMPRESSION: Large simple ascites, but less than on pre drainage scan 08/19/2015.   Electronically Signed   By: Marnee Spring M.D.   On: 08/24/2015 13:12   US Paracentesis  08/20/2015   INDICATION: Ascites, request for paracentesis.  EXAM:  ULTRASOUND-GUIDED PARACENTESIS  COMPARISON:  None.  MEDICATIONS: None.  COMPLICATIONS: None immediate  TECHNIQUE: Informed written consent was obtained from the patient after a discussion of the risks, benefits and alternatives to treatment. A timeout was performed prior to the initiation of the procedure.  Initial ultrasound scanning demonstrates a large amount of ascites within the right lower abdominal quadrant. The right lower abdomen was prepped and draped in the usual sterile fashion. 1% lidocaine was used for local anesthesia.  Under direct ultrasound guidance, a 19 gauge, 7-cm, Yueh catheter was introduced. An ultrasound image was saved for documentation purposed. The paracentesis was performed. The catheter was removed and a dressing was applied. The patient tolerated the procedure well without immediate post procedural complication.  FINDINGS: A total of approximately 6.5 liters of serous fluid was removed. Samples were sent to the laboratory as requested by the clinical team.  IMPRESSION: Successful ultrasound-guided paracentesis yielding 6.5 liters of peritoneal fluid.  Read By:  Pattricia Boss PA-C   Electronically Signed   By: Gilmer Mor D.O.   On: 08/20/2015 11:37   Dg Chest Port 1 View  08/17/2015   CLINICAL DATA:  Right PICC line placement.  EXAM: PORTABLE CHEST - 1 VIEW  COMPARISON:  02/17/2015  FINDINGS: The cardiac silhouette, mediastinal  and hilar contours are normal. Low lung volumes with vascular crowding and streaky basilar atelectasis. The right PICC line tip is in the distal SVC near the cavoatrial junction.  IMPRESSION: Right PICC line tip in good position in the distal SVC.  Low lung volumes with vascular crowding and bibasilar atelectasis.   Electronically Signed   By: Rudie Meyer M.D.   On: 08/17/2015 11:49    Microbiology: Recent Results (from the past 240 hour(s))  Culture, body fluid-bottle     Status: None   Collection Time: 08/15/15  1:00 PM  Result Value Ref Range  Status   Specimen Description FLUID PERITONEAL COLLECTED BY DOCTOR  Final   Special Requests BOTTLES DRAWN AEROBIC AND ANAEROBIC 10CC  Final   Culture NO GROWTH 5 DAYS  Final   Report Status 08/20/2015 FINAL  Final  Gram stain     Status: None   Collection Time: 08/15/15  1:00 PM  Result Value Ref Range Status   Specimen Description FLUID PERITONEAL COLLECTED BY DOCTOR  Final   Special Requests NONE  Final   Gram Stain   Final    WBC PRESENT,BOTH PMN AND MONONUCLEAR NO ORGANISMS SEEN Performed at [facility]    Report Status 08/15/2015 FINAL  Final  C difficile quick scan w PCR reflex     Status: None   Collection Time: 08/17/15  5:25 PM  Result Value Ref Range Status   C Diff antigen NEGATIVE NEGATIVE Final   C Diff toxin NEGATIVE NEGATIVE Final   C Diff interpretation Negative for toxigenic C. difficile  Final  MRSA PCR Screening     Status: Abnormal   Collection Time: 08/17/15  9:30 PM  Result Value Ref Range Status   MRSA by PCR POSITIVE (A) NEGATIVE Final    Comment:        The GeneXpert MRSA Assay (FDA approved for NASAL specimens only), is one component of a comprehensive MRSA colonization surveillance program. It is not intended to diagnose MRSA infection nor to guide or monitor treatment for MRSA infections. RESULT CALLED TO, READ BACK BY AND VERIFIED WITH: HAMMOCK S AT 2332 ON [id number] BY FORSYTH K   Culture, blood (routine x 2)     Status: None   Collection Time: 08/18/15  7:10 PM  Result Value Ref Range Status   Specimen Description BLOOD RIGHT ARM  Final   Special Requests BOTTLES DRAWN AEROBIC AND ANAEROBIC 6CC  Final   Culture NO GROWTH 5 DAYS  Final   Report Status 08/23/2015 FINAL  Final  Culture, blood (routine x 2)     Status: None   Collection Time: 08/18/15  8:02 PM  Result Value Ref Range Status   Specimen Description BLOOD PICC LINE DRAWN BY RN  Final   Special Requests BOTTLES DRAWN AEROBIC AND ANAEROBIC 6CC  Final   Culture NO GROWTH 5  DAYS  Final   Report Status 08/23/2015 FINAL  Final  Culture, Urine     Status: None   Collection Time: 08/19/15  9:08 AM  Result Value Ref Range Status   Specimen Description URINE, CATHETERIZED  Final   Special Requests IMMUNE:COMPROMISED  Final   Culture   Final    >=100,000 COLONIES/mL ENTEROCOCCUS SPECIES Performed at [facility]    Report Status 08/22/2015 FINAL  Final   Organism ID, Bacteria ENTEROCOCCUS SPECIES  Final      Susceptibility   Enterococcus species - MIC*    AMPICILLIN <=2 SENSITIVE Sensitive  LEVOFLOXACIN 2 SENSITIVE Sensitive     NITROFURANTOIN <=16 SENSITIVE Sensitive     VANCOMYCIN 2 SENSITIVE Sensitive     LINEZOLID 2 SENSITIVE Sensitive     * >=100,000 COLONIES/mL ENTEROCOCCUS SPECIES  Culture, body fluid-bottle     Status: None   Collection Time: 08/19/15  5:13 PM  Result Value Ref Range Status   Specimen Description PERITONEAL  Final   Special Requests NONE  Final   Culture NO GROWTH 5 DAYS  Final   Report Status 08/24/2015 FINAL  Final  Gram stain     Status: None   Collection Time: 08/19/15  5:13 PM  Result Value Ref Range Status   Specimen Description PERITONEAL  Final   Special Requests NONE  Final   Gram Stain   Final    CYTOSPIN SLIDE WBC PRESENT,BOTH PMN AND MONONUCLEAR NO ORGANISMS SEEN    Report Status 08/19/2015 FINAL  Final  C difficile quick scan w PCR reflex     Status: None   Collection Time: 08/19/15  9:15 PM  Result Value Ref Range Status   C Diff antigen NEGATIVE NEGATIVE Final   C Diff toxin NEGATIVE NEGATIVE Final   C Diff interpretation Negative for toxigenic C. difficile  Final     Labs: Basic Metabolic Panel:  Recent Labs Lab 08/18/15 0223  08/19/15 0452  08/20/15 0453 08/21/15 0444 08/22/15 0515 08/23/15 0510 08/24/15 0425  NA 139  < > 139  < > 143 143 143 144 141  K 3.0*  < > 3.7  < > 3.9 3.6 4.2 4.2 4.1  CL 116*  < > 119*  < > 122* 120* 116* 119* 116*  CO2 15*  < > 15*  < > 17* 20* 22 21* 21*   GLUCOSE 116*  < > 153*  < > 177* 155* 125* 123* 124*  BUN 21*  < > 19  < > 17 14 13 11 10   CREATININE 3.06*  < > 2.95*  < > 2.46* 1.87* 1.60* 1.39* 1.28*  CALCIUM 6.7*  < > 7.1*  < > 7.4* 7.5* 7.0* 7.4* 7.2*  MG 2.1  --   --   --   --   --   --   --   --   PHOS  --   --  2.1*  --  2.0*  --   --   --   --   < > = values in this interval not displayed. Liver Function Tests:  Recent Labs Lab 08/19/15 1508 08/20/15 0453  AST  --  21  ALT  --  21  ALKPHOS  --  101  BILITOT  --  1.8*  PROT  --  4.5*  ALBUMIN 1.9* 2.2*   No results for input(s): LIPASE, AMYLASE in the last 168 hours.  Recent Labs Lab 08/18/15 0927 08/21/15 0806 08/22/15 0515 08/23/15 0510  AMMONIA 87* 44* 38* 36*   CBC:  Recent Labs Lab 08/20/15 1727 08/21/15 0444 08/22/15 0515 08/23/15 0510 08/24/15 0425  WBC 5.8 6.4 6.1 6.4 7.1  HGB 7.9* 7.7* 8.5* 9.3* 8.4*  HCT 22.8* 22.7* 24.9* 27.3* 24.7*  MCV 89.8 90.4 90.2 90.1 90.8  PLT 86* 83* 62* 45* 37*   Cardiac Enzymes: No results for input(s): CKTOTAL, CKMB, CKMBINDEX, TROPONINI in the last 168 hours. BNP: BNP (last 3 results) No results for input(s): BNP in the last 8760 hours.  ProBNP (last 3 results) No results for input(s): PROBNP in the last 8760 hours.  CBG:  Recent Labs Lab 08/23/15 0733 08/23/15 1109 08/23/15 2153 08/24/15 0741 08/24/15 1132  GLUCAP 113* 209* 121* 103* 118*       Signed:  Elisse Pennick  [facility] 08/24/2015, 6:11 PM

## 2015-08-24 NOTE — Progress Notes (Signed)
Paracentesis unable to be completed due to high INR and low platelets. Dr. Kerry Hough notified.

## 2015-08-24 NOTE — Progress Notes (Signed)
Dr. Grace Isaac in radiology called and recommended holding off on LVAP due to INR and platelet counts.

## 2015-08-25 NOTE — Progress Notes (Signed)
[facility]ospice called and reported given to nurse on patient. PICC line to RIGHT arm left intact, patent and flushed w/o difficulty this shift. Patient aware of transfer to Hospice and requested for his cousin Marcelino Duster to be called and notified. This Clinical research associate called Marcelino Duster per patient's request. D/C summary/paperwork sent with EMS non-emergent transport. Patient left facility with EMS non-emergent transport @ 1027 this shift.

## 2015-08-25 NOTE — Care Management Note (Signed)
Case Management Note  Patient Details  Name: OSAMA COLESON MRN: [id number] Date of Birth: [birth year]  Subjective/Objective:                    Action/Plan:   Expected Discharge Date:                  Expected Discharge Plan:  Hospice Medical Facility  In-House Referral:  Clinical Social Work, Museum/gallery exhibitions officer  CM Consult  Post Acute Care Choice:  NA Choice offered to:  NA  DME Arranged:    DME Agency:     HH Arranged:    HH Agency:     Status of Service:  Completed, signed off  Medicare Important Message Given:    Date Medicare IM Given:    Medicare IM give by:    Date Additional Medicare IM Given:    Additional Medicare Important Message give by:     If discussed at Long Length of Stay Meetings, dates discussed:    Additional Comments: Pt discharging to [facility] today. CSW to arrange discharge to facility. Arlyss [place], RN 08/25/2015, 9:36 AM

## 2015-08-25 NOTE — Progress Notes (Signed)
   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 He was found to be hypotensive and there was concerns for developing sepsis. Pt has had multiple admissions to hospital in the last year. He has severe thrombocytopenia and coagulopathy related to liver failure. He has significant ascites and has undergone paracentesis twice throughout his stay at the hospital. Further paracentesis could not be performed by radiology due to thrombocytopenia and coagulopathy. After several discussions with patient family and palliative care service, it was decided to focus his care on comfort and quality of life. Plan will be to transition to hospice home later today.    Overnight course was unremarkable. He does not have any new complaints. He reports that his pain is controlled. Vital signs are stable and physical exam is unchanged from yesterday.    Pt will discharge to hospice home later today. There is no change in medications from the discharge summary yesterday. He is otherwise stable for discharge today.  I, Paul [name], acting a scribe, recorded this note contemporaneously in the presence of Dr. Erick Blinks, M.D. on 08/25/2015 at 9:36 AM   I have reviewed the above documentation for accuracy and completeness, and I agree with the above.  [place],Paul [name]

## 2015-08-25 NOTE — Clinical Social Work Note (Signed)
Family met with hospice yesterday and have agreed to transfer to [facility]. Bed available today. CSW discussed with pt's mother who confirms information. Agreeable to transfer via [place]. Brief support provided. D/C summary faxed.   Benay Pike, [place]

## 2015-08-25 NOTE — Progress Notes (Signed)
Subjective: Patient denies any difficulty in breathing. No new complainnt  Objective: Vital signs in last 24 hours: Temp:  [97.9 F (36.6 C)-98.6 F (37 C)] 98.5 F (36.9 C) (08/26 7253) Pulse Rate:  [101-107] 107 (08/26 0637) Resp:  [20] 20 (08/26 0637) BP: (108-121)/(72-97) 121/85 mmHg (08/26 0637) SpO2:  [98 %-100 %] 100 % (08/26 0637)  Intake/Output from previous day: 08/25 0701 - 08/26 0700 In: 480 [P.O.:480] Out: 901 [Urine:900; Emesis/NG output:1] Intake/Output this shift:     Recent Labs  08/23/15 0510 08/24/15 0425  HGB 9.3* 8.4*    Recent Labs  08/23/15 0510 08/24/15 0425  WBC 6.4 7.1  RBC 3.03* 2.72*  HCT 27.3* 24.7*  PLT 45* 37*    Recent Labs  08/23/15 0510 08/24/15 0425  NA 144 141  K 4.2 4.1  CL 119* 116*  CO2 21* 21*  BUN 11 10  CREATININE 1.39* 1.28*  GLUCOSE 123* 124*  CALCIUM 7.4* 7.2*    Recent Labs  08/22/15 1444  INR 2.25*    Generally :Patient is alert and eating his breakfast Chest is clear to auscultation His heart exam regular rate and rhythm Abdomen is distended, nontender Extremities no  edema  Assessment/Plan: Problem #1 acute kidney injury: Most likely prerenal syndrome versus ATN. Renal function continue to improve . Patient is none oliguric and has 900 cc of urine out put Problem #2 hypokalemia: His potassium has corected Problem #3 anemia: His hemoglobin is low but stable .  Problem #4 polysubstance abuse: Cocaine and alcohol Problem #5 gastroenteritis: Patient still with diarrhea but no nausea or vomting Problem #6 alcoholic liver disease: Patient presently with ascites.  Problem #7 protein calorie malnutrition Problem #8 hypocalcemia: his calcium is normal corrected for hypoalbuminemia Problem #9 hypophosphatemia: Plan: 1] we'll check his basic metabolic panel and phosphorus in the morning.Marland Kitchen   Sahara Fujimoto S 08/25/2015, 8:10 AM

## 2015-09-30 DEATH — deceased

## 2015-10-17 LAB — AFB CULTURE, BLOOD
